# Patient Record
Sex: Female | Born: 1940 | Race: Black or African American | Hispanic: No | Marital: Married | Smoking: Never smoker
Health system: Southern US, Community
[De-identification: ages and names within clinical notes are randomized; demographics above are authoritative.]

## PROBLEM LIST (undated history)

## (undated) DIAGNOSIS — T8859XA Other complications of anesthesia, initial encounter: Secondary | ICD-10-CM

## (undated) DIAGNOSIS — E669 Obesity, unspecified: Secondary | ICD-10-CM

## (undated) DIAGNOSIS — J984 Other disorders of lung: Secondary | ICD-10-CM

## (undated) DIAGNOSIS — E785 Hyperlipidemia, unspecified: Secondary | ICD-10-CM

## (undated) DIAGNOSIS — I10 Essential (primary) hypertension: Secondary | ICD-10-CM

## (undated) DIAGNOSIS — IMO0001 Reserved for inherently not codable concepts without codable children: Secondary | ICD-10-CM

## (undated) DIAGNOSIS — G20 Parkinson's disease: Secondary | ICD-10-CM

## (undated) DIAGNOSIS — M1990 Unspecified osteoarthritis, unspecified site: Secondary | ICD-10-CM

## (undated) DIAGNOSIS — E860 Dehydration: Secondary | ICD-10-CM

## (undated) DIAGNOSIS — F0390 Unspecified dementia without behavioral disturbance: Secondary | ICD-10-CM

## (undated) DIAGNOSIS — G20A1 Parkinson's disease without dyskinesia, without mention of fluctuations: Secondary | ICD-10-CM

## (undated) DIAGNOSIS — R251 Tremor, unspecified: Secondary | ICD-10-CM

## (undated) DIAGNOSIS — K219 Gastro-esophageal reflux disease without esophagitis: Secondary | ICD-10-CM

## (undated) DIAGNOSIS — R739 Hyperglycemia, unspecified: Secondary | ICD-10-CM

## (undated) DIAGNOSIS — T4145XA Adverse effect of unspecified anesthetic, initial encounter: Secondary | ICD-10-CM

## (undated) HISTORY — DX: Gastro-esophageal reflux disease without esophagitis: K21.9

## (undated) HISTORY — DX: Obesity, unspecified: E66.9

## (undated) HISTORY — DX: Hyperlipidemia, unspecified: E78.5

## (undated) HISTORY — PX: COLONOSCOPY: SHX174

## (undated) HISTORY — DX: Hyperglycemia, unspecified: R73.9

## (undated) HISTORY — DX: Other disorders of lung: J98.4

## (undated) HISTORY — DX: Essential (primary) hypertension: I10

---

## 2000-08-29 ENCOUNTER — Encounter: Admission: RE | Admit: 2000-08-29 | Discharge: 2000-08-29 | Payer: Self-pay | Admitting: Hematology and Oncology

## 2000-09-04 ENCOUNTER — Encounter: Admission: RE | Admit: 2000-09-04 | Discharge: 2000-09-04 | Payer: Self-pay | Admitting: Internal Medicine

## 2000-09-13 ENCOUNTER — Encounter: Admission: RE | Admit: 2000-09-13 | Discharge: 2000-09-13 | Payer: Self-pay | Admitting: Hematology and Oncology

## 2000-10-02 ENCOUNTER — Encounter: Admission: RE | Admit: 2000-10-02 | Discharge: 2000-10-02 | Payer: Self-pay | Admitting: Internal Medicine

## 2001-08-23 ENCOUNTER — Encounter: Admission: RE | Admit: 2001-08-23 | Discharge: 2001-08-23 | Payer: Self-pay | Admitting: Internal Medicine

## 2001-09-04 ENCOUNTER — Encounter: Admission: RE | Admit: 2001-09-04 | Discharge: 2001-09-04 | Payer: Self-pay | Admitting: Internal Medicine

## 2001-11-13 ENCOUNTER — Encounter: Payer: Self-pay | Admitting: *Deleted

## 2001-11-13 ENCOUNTER — Ambulatory Visit (HOSPITAL_COMMUNITY): Admission: RE | Admit: 2001-11-13 | Discharge: 2001-11-13 | Payer: Self-pay | Admitting: *Deleted

## 2001-11-13 ENCOUNTER — Encounter: Admission: RE | Admit: 2001-11-13 | Discharge: 2001-11-13 | Payer: Self-pay | Admitting: Internal Medicine

## 2002-02-19 ENCOUNTER — Encounter: Admission: RE | Admit: 2002-02-19 | Discharge: 2002-02-19 | Payer: Self-pay | Admitting: Internal Medicine

## 2002-08-14 ENCOUNTER — Encounter: Admission: RE | Admit: 2002-08-14 | Discharge: 2002-08-14 | Payer: Self-pay | Admitting: Internal Medicine

## 2002-09-23 ENCOUNTER — Ambulatory Visit (HOSPITAL_BASED_OUTPATIENT_CLINIC_OR_DEPARTMENT_OTHER): Admission: RE | Admit: 2002-09-23 | Discharge: 2002-09-23 | Payer: Self-pay | Admitting: Internal Medicine

## 2002-10-01 ENCOUNTER — Encounter: Admission: RE | Admit: 2002-10-01 | Discharge: 2002-10-01 | Payer: Self-pay | Admitting: Internal Medicine

## 2002-11-14 ENCOUNTER — Encounter: Admission: RE | Admit: 2002-11-14 | Discharge: 2002-11-14 | Payer: Self-pay | Admitting: Internal Medicine

## 2003-06-24 ENCOUNTER — Encounter: Admission: RE | Admit: 2003-06-24 | Discharge: 2003-06-24 | Payer: Self-pay | Admitting: Internal Medicine

## 2003-06-25 ENCOUNTER — Ambulatory Visit (HOSPITAL_COMMUNITY): Admission: RE | Admit: 2003-06-25 | Discharge: 2003-06-25 | Payer: Self-pay | Admitting: Internal Medicine

## 2003-07-07 ENCOUNTER — Encounter: Admission: RE | Admit: 2003-07-07 | Discharge: 2003-07-07 | Payer: Self-pay | Admitting: Internal Medicine

## 2003-08-11 ENCOUNTER — Emergency Department (HOSPITAL_COMMUNITY): Admission: EM | Admit: 2003-08-11 | Discharge: 2003-08-11 | Payer: Self-pay | Admitting: Emergency Medicine

## 2003-08-11 ENCOUNTER — Encounter: Payer: Self-pay | Admitting: Orthopedic Surgery

## 2003-08-26 ENCOUNTER — Encounter: Admission: RE | Admit: 2003-08-26 | Discharge: 2003-08-26 | Payer: Self-pay | Admitting: Internal Medicine

## 2003-09-03 ENCOUNTER — Encounter: Admission: RE | Admit: 2003-09-03 | Discharge: 2003-09-03 | Payer: Self-pay | Admitting: Internal Medicine

## 2003-10-28 ENCOUNTER — Encounter: Admission: RE | Admit: 2003-10-28 | Discharge: 2003-10-28 | Payer: Self-pay | Admitting: Internal Medicine

## 2003-10-29 ENCOUNTER — Encounter: Admission: RE | Admit: 2003-10-29 | Discharge: 2003-10-29 | Payer: Self-pay | Admitting: Internal Medicine

## 2004-03-16 ENCOUNTER — Encounter: Admission: RE | Admit: 2004-03-16 | Discharge: 2004-03-16 | Payer: Self-pay | Admitting: Internal Medicine

## 2004-04-19 ENCOUNTER — Encounter: Admission: RE | Admit: 2004-04-19 | Discharge: 2004-04-19 | Payer: Self-pay | Admitting: Internal Medicine

## 2004-06-22 ENCOUNTER — Encounter: Admission: RE | Admit: 2004-06-22 | Discharge: 2004-06-22 | Payer: Self-pay | Admitting: Internal Medicine

## 2004-07-26 ENCOUNTER — Encounter: Admission: RE | Admit: 2004-07-26 | Discharge: 2004-07-26 | Payer: Self-pay | Admitting: Internal Medicine

## 2004-08-19 ENCOUNTER — Ambulatory Visit: Payer: Self-pay | Admitting: Internal Medicine

## 2004-09-07 ENCOUNTER — Ambulatory Visit: Payer: Self-pay | Admitting: Internal Medicine

## 2004-09-23 ENCOUNTER — Ambulatory Visit (HOSPITAL_COMMUNITY): Admission: RE | Admit: 2004-09-23 | Discharge: 2004-09-23 | Payer: Self-pay | Admitting: Obstetrics and Gynecology

## 2004-10-04 ENCOUNTER — Ambulatory Visit: Payer: Self-pay | Admitting: Internal Medicine

## 2004-12-07 ENCOUNTER — Ambulatory Visit: Payer: Self-pay | Admitting: Internal Medicine

## 2004-12-23 ENCOUNTER — Ambulatory Visit: Payer: Self-pay | Admitting: Internal Medicine

## 2005-03-08 ENCOUNTER — Ambulatory Visit: Payer: Self-pay | Admitting: Internal Medicine

## 2005-06-07 ENCOUNTER — Ambulatory Visit: Payer: Self-pay | Admitting: Internal Medicine

## 2005-06-28 ENCOUNTER — Encounter (INDEPENDENT_AMBULATORY_CARE_PROVIDER_SITE_OTHER): Payer: Self-pay | Admitting: Internal Medicine

## 2005-06-28 ENCOUNTER — Ambulatory Visit: Payer: Self-pay | Admitting: Internal Medicine

## 2005-10-11 ENCOUNTER — Ambulatory Visit: Payer: Self-pay | Admitting: Internal Medicine

## 2005-10-13 ENCOUNTER — Ambulatory Visit: Payer: Self-pay | Admitting: Internal Medicine

## 2006-01-10 ENCOUNTER — Ambulatory Visit: Payer: Self-pay | Admitting: Internal Medicine

## 2006-04-25 ENCOUNTER — Encounter (INDEPENDENT_AMBULATORY_CARE_PROVIDER_SITE_OTHER): Payer: Self-pay | Admitting: Internal Medicine

## 2006-04-25 ENCOUNTER — Ambulatory Visit: Payer: Self-pay | Admitting: Internal Medicine

## 2006-04-27 ENCOUNTER — Ambulatory Visit (HOSPITAL_COMMUNITY): Admission: RE | Admit: 2006-04-27 | Discharge: 2006-04-27 | Payer: Self-pay | Admitting: Internal Medicine

## 2006-04-27 ENCOUNTER — Encounter (INDEPENDENT_AMBULATORY_CARE_PROVIDER_SITE_OTHER): Payer: Self-pay | Admitting: Internal Medicine

## 2006-09-27 DIAGNOSIS — K219 Gastro-esophageal reflux disease without esophagitis: Secondary | ICD-10-CM | POA: Insufficient documentation

## 2006-09-27 DIAGNOSIS — I10 Essential (primary) hypertension: Secondary | ICD-10-CM

## 2006-09-27 DIAGNOSIS — E785 Hyperlipidemia, unspecified: Secondary | ICD-10-CM

## 2006-09-27 DIAGNOSIS — E669 Obesity, unspecified: Secondary | ICD-10-CM

## 2006-10-10 ENCOUNTER — Ambulatory Visit: Payer: Self-pay | Admitting: Internal Medicine

## 2006-10-10 LAB — CONVERTED CEMR LAB
ALT: 13 units/L (ref 0–35)
Alkaline Phosphatase: 131 units/L — ABNORMAL HIGH (ref 39–117)
CO2: 27 meq/L (ref 19–32)
Chloride: 108 meq/L (ref 96–112)
Creatinine, Ser: 0.8 mg/dL (ref 0.40–1.20)
Glucose, Bld: 85 mg/dL (ref 70–99)
Sodium: 144 meq/L (ref 135–145)
Total Bilirubin: 0.9 mg/dL (ref 0.3–1.2)
Total Protein: 7.7 g/dL (ref 6.0–8.3)

## 2006-12-11 DIAGNOSIS — J64 Unspecified pneumoconiosis: Secondary | ICD-10-CM | POA: Insufficient documentation

## 2007-02-20 ENCOUNTER — Ambulatory Visit: Payer: Self-pay | Admitting: Internal Medicine

## 2007-02-20 DIAGNOSIS — J301 Allergic rhinitis due to pollen: Secondary | ICD-10-CM

## 2007-02-20 LAB — CONVERTED CEMR LAB
Cholesterol: 178 mg/dL (ref 0–200)
LDL Cholesterol: 108 mg/dL — ABNORMAL HIGH (ref 0–99)
Total CHOL/HDL Ratio: 3.3
Triglycerides: 82 mg/dL (ref <150)
VLDL: 16 mg/dL (ref 0–40)

## 2007-02-21 ENCOUNTER — Encounter (INDEPENDENT_AMBULATORY_CARE_PROVIDER_SITE_OTHER): Payer: Self-pay | Admitting: Internal Medicine

## 2007-02-27 LAB — CONVERTED CEMR LAB: OCCULT 3: NEGATIVE

## 2007-03-15 ENCOUNTER — Ambulatory Visit: Payer: Self-pay | Admitting: Internal Medicine

## 2007-04-06 ENCOUNTER — Telehealth (INDEPENDENT_AMBULATORY_CARE_PROVIDER_SITE_OTHER): Payer: Self-pay | Admitting: *Deleted

## 2007-04-10 ENCOUNTER — Telehealth (INDEPENDENT_AMBULATORY_CARE_PROVIDER_SITE_OTHER): Payer: Self-pay | Admitting: Pharmacy Technician

## 2007-04-30 ENCOUNTER — Telehealth (INDEPENDENT_AMBULATORY_CARE_PROVIDER_SITE_OTHER): Payer: Self-pay | Admitting: Pharmacy Technician

## 2007-06-05 ENCOUNTER — Ambulatory Visit: Payer: Self-pay | Admitting: Internal Medicine

## 2007-06-05 LAB — CONVERTED CEMR LAB
ALT: 15 units/L (ref 0–35)
Albumin: 4.2 g/dL (ref 3.5–5.2)
CO2: 26 meq/L (ref 19–32)
Calcium: 10 mg/dL (ref 8.4–10.5)
Creatinine, Ser: 0.79 mg/dL (ref 0.40–1.20)
Glucose, Bld: 78 mg/dL (ref 70–99)
Total Bilirubin: 1.3 mg/dL — ABNORMAL HIGH (ref 0.3–1.2)

## 2007-06-19 ENCOUNTER — Telehealth (INDEPENDENT_AMBULATORY_CARE_PROVIDER_SITE_OTHER): Payer: Self-pay | Admitting: Pharmacy Technician

## 2007-06-27 ENCOUNTER — Telehealth (INDEPENDENT_AMBULATORY_CARE_PROVIDER_SITE_OTHER): Payer: Self-pay | Admitting: Pharmacy Technician

## 2007-08-06 ENCOUNTER — Telehealth (INDEPENDENT_AMBULATORY_CARE_PROVIDER_SITE_OTHER): Payer: Self-pay | Admitting: Pharmacy Technician

## 2007-08-21 ENCOUNTER — Ambulatory Visit: Payer: Self-pay | Admitting: Internal Medicine

## 2007-08-21 LAB — CONVERTED CEMR LAB
ALT: 15 units/L (ref 0–35)
AST: 16 units/L (ref 0–37)
Albumin: 4.2 g/dL (ref 3.5–5.2)
Bilirubin Urine: NEGATIVE
CO2: 26 meq/L (ref 19–32)
HDL: 57 mg/dL (ref >39)
Leukocytes, UA: NEGATIVE
Lipase: 24 units/L (ref 0–75)
Total CHOL/HDL Ratio: 2.8
Total Protein: 8.1 g/dL (ref 6.0–8.3)
VLDL: 17 mg/dL (ref 0–40)

## 2007-08-29 ENCOUNTER — Ambulatory Visit (HOSPITAL_COMMUNITY): Admission: RE | Admit: 2007-08-29 | Discharge: 2007-08-29 | Payer: Self-pay | Admitting: Internal Medicine

## 2007-08-30 ENCOUNTER — Telehealth (INDEPENDENT_AMBULATORY_CARE_PROVIDER_SITE_OTHER): Payer: Self-pay | Admitting: Internal Medicine

## 2007-09-03 ENCOUNTER — Telehealth: Payer: Self-pay | Admitting: *Deleted

## 2007-09-07 ENCOUNTER — Ambulatory Visit (HOSPITAL_COMMUNITY): Admission: RE | Admit: 2007-09-07 | Discharge: 2007-09-07 | Payer: Self-pay | Admitting: Internal Medicine

## 2007-09-25 ENCOUNTER — Ambulatory Visit: Payer: Self-pay | Admitting: Internal Medicine

## 2007-10-04 ENCOUNTER — Telehealth (INDEPENDENT_AMBULATORY_CARE_PROVIDER_SITE_OTHER): Payer: Self-pay | Admitting: Internal Medicine

## 2007-10-31 ENCOUNTER — Telehealth (INDEPENDENT_AMBULATORY_CARE_PROVIDER_SITE_OTHER): Payer: Self-pay | Admitting: Pharmacy Technician

## 2007-12-26 ENCOUNTER — Telehealth (INDEPENDENT_AMBULATORY_CARE_PROVIDER_SITE_OTHER): Payer: Self-pay | Admitting: Pharmacy Technician

## 2008-01-04 ENCOUNTER — Telehealth (INDEPENDENT_AMBULATORY_CARE_PROVIDER_SITE_OTHER): Payer: Self-pay | Admitting: Pharmacy Technician

## 2008-02-29 ENCOUNTER — Telehealth (INDEPENDENT_AMBULATORY_CARE_PROVIDER_SITE_OTHER): Payer: Self-pay | Admitting: Pharmacy Technician

## 2008-04-04 ENCOUNTER — Encounter (INDEPENDENT_AMBULATORY_CARE_PROVIDER_SITE_OTHER): Payer: Self-pay | Admitting: Internal Medicine

## 2008-04-08 ENCOUNTER — Ambulatory Visit: Payer: Self-pay | Admitting: Internal Medicine

## 2008-04-08 LAB — CONVERTED CEMR LAB
Albumin: 4.4 g/dL (ref 3.5–5.2)
BUN: 12 mg/dL (ref 6–23)
CO2: 25 meq/L (ref 19–32)
Chloride: 104 meq/L (ref 96–112)
Creatinine, Ser: 0.79 mg/dL (ref 0.40–1.20)
Glucose, Bld: 114 mg/dL — ABNORMAL HIGH (ref 70–99)
HDL: 55 mg/dL (ref >39)
LDL Cholesterol: 86 mg/dL (ref 0–99)
Potassium: 3.9 meq/L (ref 3.5–5.3)
Sodium: 143 meq/L (ref 135–145)
Total CHOL/HDL Ratio: 2.8
Total Protein: 8.5 g/dL — ABNORMAL HIGH (ref 6.0–8.3)
VLDL: 15 mg/dL (ref 0–40)

## 2008-04-17 ENCOUNTER — Ambulatory Visit (HOSPITAL_COMMUNITY): Admission: RE | Admit: 2008-04-17 | Discharge: 2008-04-17 | Payer: Self-pay | Admitting: Internal Medicine

## 2008-04-17 ENCOUNTER — Encounter (INDEPENDENT_AMBULATORY_CARE_PROVIDER_SITE_OTHER): Payer: Self-pay | Admitting: Internal Medicine

## 2008-04-18 ENCOUNTER — Ambulatory Visit: Payer: Self-pay | Admitting: Internal Medicine

## 2008-04-18 LAB — CONVERTED CEMR LAB: OCCULT 1: NEGATIVE

## 2008-04-22 ENCOUNTER — Encounter (INDEPENDENT_AMBULATORY_CARE_PROVIDER_SITE_OTHER): Payer: Self-pay | Admitting: Internal Medicine

## 2008-04-24 ENCOUNTER — Encounter (INDEPENDENT_AMBULATORY_CARE_PROVIDER_SITE_OTHER): Payer: Self-pay | Admitting: Internal Medicine

## 2008-05-06 ENCOUNTER — Ambulatory Visit: Payer: Self-pay | Admitting: Internal Medicine

## 2008-05-08 LAB — CONVERTED CEMR LAB
AST: 13 units/L (ref 0–37)
Alkaline Phosphatase: 149 units/L — ABNORMAL HIGH (ref 39–117)
HCV Ab: NEGATIVE
Hep B Core Total Ab: NEGATIVE

## 2008-09-16 ENCOUNTER — Ambulatory Visit: Payer: Self-pay | Admitting: Internal Medicine

## 2008-09-17 ENCOUNTER — Encounter (INDEPENDENT_AMBULATORY_CARE_PROVIDER_SITE_OTHER): Payer: Self-pay | Admitting: Internal Medicine

## 2008-10-07 ENCOUNTER — Telehealth: Payer: Self-pay | Admitting: *Deleted

## 2009-01-13 ENCOUNTER — Ambulatory Visit: Payer: Self-pay | Admitting: Internal Medicine

## 2009-01-16 LAB — CONVERTED CEMR LAB
ALT: 9 units/L (ref 0–35)
BUN: 12 mg/dL (ref 6–23)
CO2: 23 meq/L (ref 19–32)
Cholesterol: 145 mg/dL (ref 0–200)
LDL Cholesterol: 80 mg/dL (ref 0–99)
Potassium: 4.5 meq/L (ref 3.5–5.3)
Sodium: 143 meq/L (ref 135–145)
Total CHOL/HDL Ratio: 2.8
Total Protein: 8 g/dL (ref 6.0–8.3)
Triglycerides: 68 mg/dL (ref <150)

## 2009-01-19 ENCOUNTER — Telehealth: Payer: Self-pay | Admitting: *Deleted

## 2009-03-13 ENCOUNTER — Encounter (INDEPENDENT_AMBULATORY_CARE_PROVIDER_SITE_OTHER): Payer: Self-pay | Admitting: Internal Medicine

## 2009-05-19 ENCOUNTER — Ambulatory Visit: Payer: Self-pay | Admitting: Internal Medicine

## 2009-05-22 DIAGNOSIS — E119 Type 2 diabetes mellitus without complications: Secondary | ICD-10-CM

## 2009-05-22 LAB — CONVERTED CEMR LAB
Chloride: 103 meq/L (ref 96–112)
Creatinine, Ser: 0.77 mg/dL (ref 0.40–1.20)
GFR calc non Af Amer: >60 mL/min (ref >60)
HDL: 53 mg/dL (ref >39)
LDL Cholesterol: 80 mg/dL (ref 0–99)
Sodium: 141 meq/L (ref 135–145)
Total CHOL/HDL Ratio: 2.8
Total CK: 171 units/L (ref 7–177)
Triglycerides: 64 mg/dL (ref <150)
VLDL: 13 mg/dL (ref 0–40)

## 2009-05-25 ENCOUNTER — Telehealth: Payer: Self-pay

## 2009-05-25 ENCOUNTER — Ambulatory Visit: Payer: Self-pay | Admitting: Internal Medicine

## 2009-05-25 LAB — CONVERTED CEMR LAB
Hemoglobin, Urine: NEGATIVE
Specific Gravity, Urine: 1.025 (ref 1.005–1.030)
Urine Glucose: NEGATIVE mg/dL
pH: 5.5 (ref 5.0–8.0)

## 2009-05-26 ENCOUNTER — Encounter (INDEPENDENT_AMBULATORY_CARE_PROVIDER_SITE_OTHER): Payer: Self-pay | Admitting: Internal Medicine

## 2009-06-23 ENCOUNTER — Ambulatory Visit: Payer: Self-pay | Admitting: Internal Medicine

## 2009-07-08 ENCOUNTER — Telehealth: Payer: Self-pay | Admitting: *Deleted

## 2009-07-10 ENCOUNTER — Ambulatory Visit: Payer: Self-pay | Admitting: Internal Medicine

## 2009-07-20 ENCOUNTER — Telehealth (INDEPENDENT_AMBULATORY_CARE_PROVIDER_SITE_OTHER): Payer: Self-pay | Admitting: Internal Medicine

## 2009-07-21 ENCOUNTER — Telehealth: Payer: Self-pay | Admitting: *Deleted

## 2009-09-03 ENCOUNTER — Ambulatory Visit: Payer: Self-pay | Admitting: Internal Medicine

## 2009-09-04 ENCOUNTER — Encounter: Payer: Self-pay | Admitting: Internal Medicine

## 2009-09-04 LAB — CONVERTED CEMR LAB
Cholesterol: 168 mg/dL (ref 0–200)
LDL Cholesterol: 91 mg/dL (ref 0–99)

## 2009-09-29 ENCOUNTER — Ambulatory Visit: Payer: Self-pay | Admitting: Internal Medicine

## 2009-09-29 LAB — CONVERTED CEMR LAB: Blood Glucose, AC Bkfst: 117 mg/dL

## 2009-10-13 ENCOUNTER — Ambulatory Visit: Payer: Self-pay | Admitting: Internal Medicine

## 2010-01-12 ENCOUNTER — Encounter (INDEPENDENT_AMBULATORY_CARE_PROVIDER_SITE_OTHER): Payer: Self-pay | Admitting: *Deleted

## 2010-01-12 ENCOUNTER — Ambulatory Visit: Payer: Self-pay | Admitting: Internal Medicine

## 2010-01-14 LAB — CONVERTED CEMR LAB
Albumin: 4.2 g/dL (ref 3.5–5.2)
Calcium: 9.6 mg/dL (ref 8.4–10.5)
Glucose, Bld: 136 mg/dL — ABNORMAL HIGH (ref 70–99)
HDL: 55 mg/dL (ref >39)
Total Bilirubin: 0.9 mg/dL (ref 0.3–1.2)
Total CHOL/HDL Ratio: 3.3
Total Protein: 7.7 g/dL (ref 6.0–8.3)

## 2010-01-19 ENCOUNTER — Ambulatory Visit (HOSPITAL_COMMUNITY): Admission: RE | Admit: 2010-01-19 | Discharge: 2010-01-19 | Payer: Self-pay | Admitting: Internal Medicine

## 2010-01-20 ENCOUNTER — Encounter (INDEPENDENT_AMBULATORY_CARE_PROVIDER_SITE_OTHER): Payer: Self-pay | Admitting: *Deleted

## 2010-01-25 ENCOUNTER — Ambulatory Visit: Payer: Self-pay | Admitting: Gastroenterology

## 2010-02-03 ENCOUNTER — Ambulatory Visit: Payer: Self-pay | Admitting: Gastroenterology

## 2010-02-08 ENCOUNTER — Encounter: Payer: Self-pay | Admitting: Gastroenterology

## 2010-04-06 ENCOUNTER — Ambulatory Visit: Payer: Self-pay | Admitting: Internal Medicine

## 2010-04-06 LAB — CONVERTED CEMR LAB: Pap Smear: NEGATIVE

## 2010-07-13 ENCOUNTER — Ambulatory Visit: Payer: Self-pay | Admitting: Internal Medicine

## 2010-07-13 DIAGNOSIS — G20 Parkinson's disease: Secondary | ICD-10-CM

## 2010-08-04 LAB — CONVERTED CEMR LAB
ALT: 12 units/L (ref 0–35)
AST: 12 units/L (ref 0–37)
Albumin: 3.9 g/dL (ref 3.5–5.2)
Alkaline Phosphatase: 112 units/L (ref 39–117)
Calcium: 9.6 mg/dL (ref 8.4–10.5)
Cholesterol: 163 mg/dL (ref 0–200)
HDL: 56 mg/dL (ref >39)
Sodium: 140 meq/L (ref 135–145)
Total Bilirubin: 0.9 mg/dL (ref 0.3–1.2)
VLDL: 13 mg/dL (ref 0–40)

## 2010-08-20 ENCOUNTER — Ambulatory Visit: Payer: Self-pay | Admitting: Internal Medicine

## 2010-09-28 ENCOUNTER — Ambulatory Visit: Payer: Self-pay | Admitting: Internal Medicine

## 2010-11-12 ENCOUNTER — Ambulatory Visit: Payer: Self-pay | Admitting: Internal Medicine

## 2010-11-12 LAB — CONVERTED CEMR LAB: Blood Glucose, Fingerstick: 139

## 2010-12-28 NOTE — Assessment & Plan Note (Signed)
Summary: CHECKUP/SB.   Vital Signs:  Patient profile:   70 year old female Weight:      199.4 pounds (90.64 kg) BMI:     37.50 Temp:     97.3 degrees F (36.28 degrees C) Pulse rate:   79 / minute BP sitting:   122 / 64  (left arm) Cuff size:   large  Vitals Entered By: Cynda Familia Duncan Dull) (September 28, 2010 10:35 AM) CC: pt c/o "hand tremors" x 3nths  Have you ever been in a relationship where you felt threatened, hurt or afraid?No   Does patient need assistance? Functional Status Self care Ambulation Normal   Diabetic Foot Exam Foot Inspection Is there a history of a foot ulcer?              No Is there a foot ulcer now?              No Can the patient see the bottom of their feet?          Yes Are the shoes appropriate in style and fit?          Yes Is there swelling or an abnormal foot shape?          No Are the toenails long?                No Are the toenails thick?                Yes Are the toenails ingrown?              Yes Is there heavy callous build-up?              No  Diabetic Foot Care Education Patient educated on appropriate care of diabetic feet.   High Risk Feet? No   10-g (5.07) Semmes-Weinstein Monofilament Test Performed by: Lynn Ito          Right Foot          Left Foot Visual Inspection     normal           normal Test Control      normal         normal Site 1         normal         normal Site 2         normal         normal Site 3         normal         normal Site 4         normal         normal Site 5         normal         normal Site 6         normal         normal  Impression      normal         normal   Primary Care Provider:  Zoila Shutter MD  CC:  pt c/o "hand tremors" x 3nths.  History of Present Illness: 70 yr old who comes in for 3 month follow up. She has not been exercising and her weight is not down.  When she was last in she had a fasting bg that was 144 and a HbA1c of 7.2 and I have told her that does  put her in the diabetic category. I have also again told  her if she gets serious and loses 10 pounds then this would normalize.  She says her breathing has been fine.  Her main reason for coming in earlier than her scheduled appointment is because she is more concerned about the tremor in her hands that seems to be getting more pronounced. It is there all of the time and it does not seem to get worse with movement. Her thumbs are always moving.  She does think she remembers her mother having a tremor as well as she got older, but isn't sure she remembers it being very bad.  Current Medications (verified): 1)  Hydrochlorothiazide 25 Mg Tabs (Hydrochlorothiazide) .... Take 1 Tablet By Mouth Once A Day 2)  Prilosec 40 Mg Cpdr (Omeprazole) .... Take 1 Tablet By Mouth Once A Day 3)  Proair Hfa 108 (90 Base) Mcg/act Aers (Albuterol Sulfate) .... 2 Puffs Every 4-6 Hours As Needed For Shortness of Breath 4)  Lisinopril 10 Mg Tabs (Lisinopril) .... Take 1 Tablet By Mouth Once A Day 5)  Pravastatin Sodium 80 Mg Tabs (Pravastatin Sodium) .... Take 1 Tablet By Mouth Once A Day 6)  Advair Diskus 500-50 Mcg/dose Aepb (Fluticasone-Salmeterol) .Marland Kitchen.. 1puff 2 Times A Day  Allergies (verified): No Known Drug Allergies  Past History:  Past Medical History: Reviewed history from 09/27/2006 and no changes required. 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 Mother died of MI age 52. Father died of unknown cause age unknown. Alcoholic. Brother died of cancer ? type Brother died of "bone cancer" Family History Diabetes 1st degree relative - Mother  Social History: Reviewed history from 01/13/2009 and no changes required. Occupation: [employer] x 31 yrs. Married  5 children - 1 with  sarcoidosis; daugher with COPD Never Smoked Alcohol use-no Drug use-no Regular exercise-no  Review of Systems General:  Denies fatigue, malaise, and weakness. CV:  Denies chest pain or discomfort and palpitations. Resp:  Denies cough and shortness of breath. Neuro:  Complains of tremors; denies falling down, numbness, tingling, and weakness. Psych:  Denies depression.  Physical Exam  General:  alert and well-developed.   Head:  normocephalic and atraumatic.   Eyes:  vision grossly intact.   Ears:  R ear normal.   Neck:  supple and no masses.   Lungs:  normal respiratory effort and normal breath sounds.   Heart:  normal rate, regular rhythm, and no murmur.   Abdomen:  soft, non-tender, and normal bowel sounds.   Extremities:  no edema Neurologic:  alert & oriented X3, cranial nerves II-XII intact, and strength normal in all extremities.  she does have a fine tremor in both hands. her thumbs do seem to be always moving. intention does not seem to make the tremor worse.  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 Therefore we will do neurlology referral.  Problem # 2:  HYPERTENSION (ICD-401.9) Well controlled. Continue present regimen. Her updated medication list for this problem includes:    Hydrochlorothiazide 25 Mg Tabs (Hydrochlorothiazide) .Marland Kitchen... Take 1 tablet by mouth once a day    Lisinopril 10 Mg Tabs (Lisinopril) .Marland Kitchen... Take 1 tablet by mouth once a day  Problem # 3:  DIABETES MELLITUS, TYPE II (ICD-250.00) She does in  fact have mild diet controlled diabetes. If she lost even 5-10 kilos it would normalize her sugars as I have discussed with her today. Her updated medication list for this problem includes:    Lisinopril 10 Mg Tabs (Lisinopril) .Marland Kitchen... Take 1 tablet by mouth once a day  Orders: T- Capillary Blood Glucose (82948) T-Hgb A1C (in-house) (40981XB)  Problem # 4:  OCCUPATIONAL ASTHMA (ICD-505) Her breathing has been good and she is hardly ever using her rescue inhaler. Her updated medication list for this problem includes:    Proair Hfa 108 (90 Base) Mcg/act Aers (Albuterol sulfate) .Marland Kitchen... 2 puffs every 4-6 hours as needed for shortness of breath    Advair Diskus 500-50 Mcg/dose Aepb (Fluticasone-salmeterol) .Marland Kitchen... 1puff 2 times a day  Problem # 5:  HYPERLIPIDEMIA (ICD-272.4) Stable on statin. Continue. Her updated medication list for this problem includes:    Pravastatin Sodium 80 Mg Tabs (Pravastatin sodium) .Marland Kitchen... Take 1 tablet by mouth once a day  Complete Medication List: 1)  Hydrochlorothiazide 25 Mg Tabs (Hydrochlorothiazide) .... Take 1 tablet by mouth once a day 2)  Prilosec 40 Mg Cpdr (Omeprazole) .... Take 1 tablet by mouth once a day 3)  Proair Hfa 108 (90 Base) Mcg/act Aers (Albuterol sulfate) .... 2 puffs every 4-6 hours as needed for shortness of breath 4)  Lisinopril 10 Mg Tabs (Lisinopril) .... Take 1 tablet by mouth once a day 5)  Pravastatin Sodium 80 Mg Tabs (Pravastatin sodium) .... Take 1 tablet by mouth once a day 6)  Advair Diskus 500-50 Mcg/dose Aepb (Fluticasone-salmeterol) .Marland Kitchen.. 1puff 2 times a day  Other Orders: Neurology Referral (Neuro)  Patient Instructions: 1)  Please schedule a follow-up appointment in 3 months. 2)  Please come fasting when you come in. 3)  I know you can lose that 10 pounds if you work on it, and I think it will bring your blood sugar into a normal range.Marland Kitchen 4)  I wish you happy holidays.   5)  We are referring you to neurology today to evaluate  the tremor in your hands   Orders Added: 1)  T- Capillary Blood Glucose [82948] 2)  T-Hgb A1C (in-house) [83036QW] 3)  Est. Patient Level IV [14782] 4)  Neurology Referral [Neuro]    Prevention & Chronic Care Immunizations   Influenza vaccine: Fluvax MCR  (08/20/2010)   Influenza vaccine due: 09/24/2008    Tetanus booster: 09/29/2009: Tdap    Pneumococcal vaccine: Pneumovax  (09/29/2009)   Pneumococcal vaccine due: None    H. zoster vaccine: Not documented  Colorectal Screening   Hemoccult: normal  (04/18/2008)   Hemoccult due: 04/18/2009    Colonoscopy: DONE  (02/03/2010)   Colonoscopy action/deferral: GI referral  (01/12/2010)   Colonoscopy due: 01/2013  Other Screening   Pap smear: NEGATIVE FOR INTRAEPITHELIAL LESIONS OR MALIGNANCY.  (04/06/2010)   Pap smear action/deferral: Ordered  (04/06/2010)   Pap smear due: 04/26/2007    Mammogram: ASSESSMENT: Negative - BI-RADS 1^MM DIGITAL SCREENING  (01/19/2010)   Mammogram  action/deferral: Ordered  (01/12/2010)   Mammogram due: 04/2009    DXA bone density scan:  Lumbar Spine:  T Score > -1.0 Spine.  Hip Total: T Score > -1.0 Hip.    (04/17/2008)   DXA scan due: 04/2010    Smoking status: never  (07/13/2010)  Diabetes Mellitus   HgbA1C: 7.3  (09/28/2010)    Eye exam: Not documented   Diabetic eye exam action/deferral: Deferred  (01/12/2010)    Foot exam: yes  (09/28/2010)   Foot exam action/deferral: Do today   High risk foot: No  (09/28/2010)   Foot care education: Done  (09/28/2010)    Urine microalbumin/creatinine ratio: Not documented   Urine microalbumin action/deferral: Deferred    Diabetes flowsheet reviewed?: Yes   Progress toward A1C goal: Unchanged   Diabetes comments: she is diet controlled and I have discussed the importance of weight loss and exercise  Lipids   Total Cholesterol: 163  (07/13/2010)   LDL: 94  (07/13/2010)   LDL Direct: Not documented   HDL: 56  (07/13/2010)    Triglycerides: 64  (07/13/2010)    SGOT (AST): 12  (07/13/2010)   SGPT (ALT): 12  (07/13/2010)   Alkaline phosphatase: 112  (07/13/2010)   Total bilirubin: 0.9  (07/13/2010)    Lipid flowsheet reviewed?: Yes   Progress toward LDL goal: At goal  Hypertension   Last Blood Pressure: 122 / 64  (09/28/2010)   Serum creatinine: 0.83  (07/13/2010)   Serum potassium 4.3  (07/13/2010)    Hypertension flowsheet reviewed?: Yes   Progress toward BP goal: At goal  Self-Management Support :   Personal Goals (by the next clinic visit) :     Personal A1C goal: 7  (01/12/2010)     Personal blood pressure goal: 130/80  (01/12/2010)     Personal LDL goal: 100  (01/12/2010)    Patient will work on the following items until the next clinic visit to reach self-care goals:     Medications and monitoring: take my medicines every day, examine my feet every day  (09/28/2010)     Eating: eat foods that are low in salt, eat fruit for snacks and desserts  (09/28/2010)     Activity: join a walking program  (07/13/2010)    Diabetes self-management support: Written self-care plan  (09/28/2010)   Diabetes care plan printed    Hypertension self-management support: Written self-care plan  (09/28/2010)   Hypertension self-care plan printed.    Lipid self-management support: Written self-care plan  (09/28/2010)   Lipid self-care plan printed.   Nursing Instructions: Diabetic foot exam today   Laboratory Results   Blood Tests   Date/Time Received: September 28, 2010 10:53 AM  Date/Time Reported: Burke Keels  September 28, 2010 10:53 AM   HGBA1C: 7.3%   (Normal Range: Non-Diabetic - 3-6%   Control Diabetic - 6-8%) CBG Fasting:: 158mg /dL

## 2010-12-28 NOTE — Miscellaneous (Signed)
Summary: [facility]  Clinical Lists Changes  Medications: Added new medication of MOVIPREP 100 GM  SOLR (PEG-KCL-NACL-NASULF-NA ASC-C) As per prep instructions. - Signed Rx of MOVIPREP 100 GM  SOLR (PEG-KCL-NACL-NASULF-NA ASC-C) As per prep instructions.;  #1 x 0;  Signed;  Entered by: Ezra Sites RN;  Authorized by: Louis Meckel MD;  Method used: Electronically to [facility] [address] [phone]*, [address] [place], [place]  [address], Ph: [phone], Fax: [phone] Observations: Added new observation of NKA: T (01/25/2010 9:59)    Prescriptions: MOVIPREP 100 GM  SOLR (PEG-KCL-NACL-NASULF-NA ASC-C) As per prep instructions.  #1 x 0   Entered by:   Ezra Sites RN   Authorized by:   Louis Meckel MD   Signed by:   Ezra Sites RN on 01/25/2010   Method used:   Electronically to        [facility] [phone]* (retail)       [address] [place], [place]  [address]       Ph: [phone]       Fax: [phone]   RxID:   [id number]

## 2010-12-28 NOTE — Assessment & Plan Note (Signed)
Summary: FU VISIT/DS   Vital Signs:  Patient profile:   70 year old female Height:      61.25 inches (155.57 cm) Weight:      197.0 pounds (89.55 kg) BMI:     37.05 Temp:     97.6 degrees F oral Pulse rate:   89 / minute BP sitting:   129 / 80  (right arm)  Vitals Entered By: Chinita Pester RN (January 12, 2010 9:47 AM) CC: Check-up, Hypertension Management Is Patient Diabetic? No Nutritional Status BMI of > 30 = obese  Have you ever been in a relationship where you felt threatened, hurt or afraid?Unable to ask;someone w/pt.   Does patient need assistance? Functional Status Self care Ambulation Normal   Primary Care Provider:  Zoila Shutter MD  CC:  Check-up and Hypertension Management.  History of Present Illness: 70 yr old pt. who comes in for follow up. When she was last in I challenged her with getting some regular exercise. Though she has not been exercising she has been watching what she eats and has lost 3 pounds! This is a big deal for her.  We are to discuss health maintenance today. She has never had a colonoscopy but is ready to get one. She is due for a mammogram, and a pap smear. She is up to date on immunizations.  Her breathing has been good. She has not used  her proair inhaler in the last week and only has needed it occasionally.  Though she is prescribed amlodipine, she has not been taking it because she did not know what it was for. However her BP is well- controlled today on just 2 agents.  Hypertension History:      Positive major cardiovascular risk factors include female age 87 years old or older, diabetes, hyperlipidemia, and hypertension.  Negative major cardiovascular risk factors include non-tobacco-user status.    Preventive Screening-Counseling & Management  Alcohol-Tobacco     Alcohol drinks/day: 0     Smoking Status: never     Passive Smoke Exposure: no  Caffeine-Diet-Exercise     Does Patient Exercise: no  Current Medications  (verified): 1)  Hydrochlorothiazide 25 Mg Tabs (Hydrochlorothiazide) .... Take 1 Tablet By Mouth Once A Day 2)  Prilosec 40 Mg Cpdr (Omeprazole) .... Take 1 Tablet By Mouth Once A Day 3)  Amlodipine Besylate 5 Mg Tabs (Amlodipine Besylate) .... Take 1 Tablet By Mouth Once A Day 4)  Proair Hfa 108 (90 Base) Mcg/act Aers (Albuterol Sulfate) .... 2 Puffs Every 4-6 Hours As Needed For Shortness of Breath 5)  Lisinopril 10 Mg Tabs (Lisinopril) .... Take 1 Tablet By Mouth Once A Day 6)  Pravastatin Sodium 80 Mg Tabs (Pravastatin Sodium) .... Take 1 Tablet By Mouth Once A Day 7)  Advair Diskus 500-50 Mcg/dose Aepb (Fluticasone-Salmeterol) .Marland Kitchen.. 1puff 2 Times A Day  Allergies (verified): No Known Drug Allergies  Review of Systems General:  Denies fatigue and weakness. CV:  Denies chest pain or discomfort and palpitations. Resp:  Denies cough and shortness of breath. GI:  Denies abdominal pain, constipation, nausea, and vomiting.   Impression & Recommendations:  Problem # 1:  HYPERTENSION (ICD-401.9) Will d/c the amlodipine. She is well controlled. continue weight loss. Her updated medication list for this problem includes:    Hydrochlorothiazide 25 Mg Tabs (Hydrochlorothiazide) .Marland Kitchen... Take 1 tablet by mouth once a day    Amlodipine Besylate 5 Mg Tabs (Amlodipine besylate) .Marland Kitchen... Take 1 tablet by mouth once a day  Lisinopril 10 Mg Tabs (Lisinopril) .Marland Kitchen... Take 1 tablet by mouth once a day  Orders: T-Comprehensive Metabolic Panel ([id number])  Problem # 2:  OCCUPATIONAL ASTHMA (ICD-505) Her breathing is good. Her updated medication list for this problem includes:    Proair Hfa 108 (90 Base) Mcg/act Aers (Albuterol sulfate) .Marland Kitchen... 2 puffs every 4-6 hours as needed for shortness of breath    Advair Diskus 500-50 Mcg/dose Aepb (Fluticasone-salmeterol) .Marland Kitchen... 1puff 2 times a day  Problem # 3:  PREVENTIVE HEALTH CARE (ICD-V70.0) Colonoscopy and mammogram ordered. Pt. will return in 3 months for  physical exam.  Problem # 4:  DIABETES MELLITUS, TYPE II (ICD-250.00) She is well-controlled. The following medications were removed from the medication list:    Metformin Hcl 500 Mg Tabs (Metformin hcl) .Marland Kitchen... Take 1 tablet by mouth once a day Her updated medication list for this problem includes:    Lisinopril 10 Mg Tabs (Lisinopril) .Marland Kitchen... Take 1 tablet by mouth once a day  Orders: T- Capillary Blood Glucose (91478) T-Hgb A1C (in-house) ([id number]) T-Comprehensive Metabolic Panel ([id number])  Problem # 5:  OBESITY (ICD-278.00) Pt. is going in the right direction and is encouraged to continue.  Problem # 6:  HYPERLIPIDEMIA (ICD-272.4)  Her updated medication list for this problem includes:    Pravastatin Sodium 80 Mg Tabs (Pravastatin sodium) .Marland Kitchen... Take 1 tablet by mouth once a day  Orders: T-Comprehensive Metabolic Panel [phone]) T-Lipid Profile ([id number])  Complete Medication List: 1)  Hydrochlorothiazide 25 Mg Tabs (Hydrochlorothiazide) .... Take 1 tablet by mouth once a day 2)  Prilosec 40 Mg Cpdr (Omeprazole) .... Take 1 tablet by mouth once a day 3)  Amlodipine Besylate 5 Mg Tabs (Amlodipine besylate) .... Take 1 tablet by mouth once a day 4)  Proair Hfa 108 (90 Base) Mcg/act Aers (Albuterol sulfate) .... 2 puffs every 4-6 hours as needed for shortness of breath 5)  Lisinopril 10 Mg Tabs (Lisinopril) .... Take 1 tablet by mouth once a day 6)  Pravastatin Sodium 80 Mg Tabs (Pravastatin sodium) .... Take 1 tablet by mouth once a day 7)  Advair Diskus 500-50 Mcg/dose Aepb (Fluticasone-salmeterol) .Marland Kitchen.. 1puff 2 times a day  Other Orders: Mammogram (Screening) (Mammo) Gastroenterology Referral (GI)  Hypertension Assessment/Plan:      The patient's hypertensive risk group is category C: Target organ damage and/or diabetes.  Her calculated 10 year risk of coronary heart disease is 9 %.  Today's blood pressure is 129/80.      Patient Instructions: 1)  Please schedule  a follow-up appointment in 3 months. 2)  We will do a physical exam when you come in. 3)  Keep up the good work with weight loss. AND start walking!!! 4)  We will schedule you for a mammogram. I know it will hurt for a little bit but it is important. 5)  We will schedule you for a colonoscopy. 6)  We are getting fasting lab work today and will let you know about it when it returns. 7)  I have refilled all of your medications for a year. 8)  Your 2 inhalers had plenty of refills. 9)  TAKE CARE!!! Prescriptions: PRAVASTATIN SODIUM 80 MG TABS (PRAVASTATIN SODIUM) Take 1 tablet by mouth once a day  #90 x 4   Entered and Authorized by:   Zoila Shutter MD   Signed by:   Zoila Shutter MD on 01/12/2010   Method used:   Electronically to        [facility] [address] [phone]* (retail)       [address], [place]  [address]       Ph: [phone]       Fax: [phone]   RxID:   [id number] LISINOPRIL 10 MG TABS (LISINOPRIL) Take 1 tablet by mouth once a day  #90 x 4   Entered and Authorized by:   Zoila Shutter MD   Signed by:   Zoila Shutter MD on 01/12/2010   Method used:   Electronically to        [facility] [address] [phone]* (retail)       [address] [place], [place]  [address]       Ph: [phone]       Fax: [phone]   RxID:   [id number] HYDROCHLOROTHIAZIDE 25 MG TABS (HYDROCHLOROTHIAZIDE) Take 1 tablet by mouth once a day  #90 x 4   Entered and Authorized by:   Zoila Shutter MD   Signed by:   Zoila Shutter MD on 01/12/2010   Method used:   Electronically to        [facility] [address] [phone]* (retail)       [address] [place], [place]  [address]       Ph: [phone]       Fax: [phone]   RxID:   [id number]   Prevention & Chronic Care Immunizations   Influenza vaccine: Fluvax Non-MCR  (09/29/2009)   Influenza vaccine due: 09/24/2008    Tetanus booster: 09/29/2009: Tdap    Pneumococcal vaccine: Pneumovax   (09/29/2009)   Pneumococcal vaccine due: None    H. zoster vaccine: Not documented  Colorectal Screening   Hemoccult: normal  (04/18/2008)   Hemoccult due: 04/18/2009    Colonoscopy: Not documented   Colonoscopy action/deferral: GI referral  (01/12/2010)  Other Screening   Pap smear: Normal  (04/25/2006)   Pap smear due: 04/26/2007    Mammogram: Assessment: BIRADS 1.   (04/17/2008)   Mammogram action/deferral: Ordered  (01/12/2010)   Mammogram due: 04/2009    DXA bone density scan:  Lumbar Spine:  T Score > -1.0 Spine.  Hip Total: T Score > -1.0 Hip.    (04/17/2008)   DXA scan due: 04/2010    Smoking status: never  (01/12/2010)  Diabetes Mellitus   HgbA1C: 7.1  (01/12/2010)    Eye exam: Not documented   Diabetic eye exam action/deferral: Deferred  (01/12/2010)    Foot exam: Not documented   Foot exam action/deferral: Deferred   High risk foot: Not documented   Foot care education: Not documented    Urine microalbumin/creatinine ratio: Not documented   Urine microalbumin action/deferral: Deferred    Diabetes flowsheet reviewed?: Yes   Progress toward A1C goal: At goal  Lipids   Total Cholesterol: 168  (09/04/2009)   LDL: 91  (09/04/2009)   LDL Direct: Not documented   HDL: 60  (09/04/2009)   Triglycerides: 84  (09/04/2009)    SGOT (AST): 13  (01/13/2009)   SGPT (ALT): 9  (01/13/2009) CMP ordered    Alkaline phosphatase: 145  (01/13/2009)   Total bilirubin: 1.1  (01/13/2009)    Lipid flowsheet reviewed?: Yes   Progress toward LDL goal: At goal  Hypertension   Last Blood Pressure: 129 / 80  (01/12/2010)   Serum creatinine: 0.84  (06/23/2009)   Serum potassium 4.0  (06/23/2009) CMP  ordered     Hypertension flowsheet reviewed?: Yes   Progress toward BP goal: At goal  Self-Management Support :   Personal Goals (by the next clinic visit) :     Personal A1C goal: 7  (01/12/2010)     Personal blood pressure goal: 130/80  (01/12/2010)     Personal LDL  goal: 100  (01/12/2010)    Patient will work on the following items until the next clinic visit to reach self-care goals:     Medications and monitoring: take my medicines every day  (01/12/2010)     Eating: eat more vegetables  (09/29/2009)     Activity: take a 30 minute walk every day  (01/12/2010)    Diabetes self-management support: Not documented    Hypertension self-management support: Not documented    Lipid self-management support: Not documented    Nursing Instructions: GI referral for screening colonoscopy (see order) Schedule screening mammogram (see order)   Process Orders Check Orders Results:     [facility]: Check successful Tests Sent for requisitioning (January 12, 2010 3:34 PM):     01/12/2010: [facility] -- T-Comprehensive Metabolic Panel [[id number]] (signed)     01/12/2010: [facility] -- T-Lipid Profile [phone] (signed)    Laboratory Results   Blood Tests   Date/Time Received: January 12, 2010 9:58 AM Date/Time Reported: Alric Quan  January 12, 2010 9:58 AM  HGBA1C: 7.1%   (Normal Range: Non-Diabetic - 3-6%   Control Diabetic - 6-8%) CBG Fasting:: 134mg /dL

## 2010-12-28 NOTE — Letter (Signed)
Summary: [facility] Instructions  [facility]  [address] [place], [place] [address]   Phone: [phone]  Fax: [phone]       Nicole Baxter    12-28-1940    MRN: [id number]        Procedure Day Dorna Bloom:  Wednesday  02/03/10     Arrival Time:  7:30am     Procedure Time:  8:30am     Location of Procedure:                    _X _  [facility] (4th Floor)                        PREPARATION FOR COLONOSCOPY WITH MOVIPREP   Starting 5 days prior to your procedure   Friday 03/04   do not eat nuts, seeds, popcorn, corn, beans, peas,  salads, or any raw vegetables.  Do not take any fiber supplements (e.g. Metamucil, Citrucel, and Benefiber).  THE DAY BEFORE YOUR PROCEDURE         DATE:   03/08   DAY: Tuesday  1.  Drink clear liquids the entire day-NO SOLID FOOD  2.  Do not drink anything colored red or purple.  Avoid juices with pulp.  No orange juice.  3.  Drink at least 64 oz. (8 glasses) of fluid/clear liquids during the day to prevent dehydration and help the prep work efficiently.  CLEAR LIQUIDS INCLUDE: Water Jello Ice Popsicles Tea (sugar ok, no milk/cream) Powdered fruit flavored drinks Coffee (sugar ok, no milk/cream) Gatorade Juice: apple, white grape, white cranberry  Lemonade Clear bullion, consomm, broth Carbonated beverages (any kind) Strained chicken noodle soup Hard Candy                             4.  In the morning, mix first dose of MoviPrep solution:    Empty 1 Pouch A and 1 Pouch B into the disposable container    Add lukewarm drinking water to the top line of the container. Mix to dissolve    Refrigerate (mixed solution should be used within 24 hrs)  5.  Begin drinking the prep at 5:00 p.m. The MoviPrep container is divided by 4 marks.   Every 15 minutes drink the solution down to the next mark (approximately 8 oz) until the full liter is complete.   6.  Follow completed prep with 16 oz of clear liquid of your  choice (Nothing red or purple).  Continue to drink clear liquids until bedtime.  7.  Before going to bed, mix second dose of MoviPrep solution:    Empty 1 Pouch A and 1 Pouch B into the disposable container    Add lukewarm drinking water to the top line of the container. Mix to dissolve    Refrigerate  THE DAY OF YOUR PROCEDURE      DATE:  03/09  DAY:  Wednesday  Beginning at  3:30 a.m. (5 hours before procedure):         1. Every 15 minutes, drink the solution down to the next mark (approx 8 oz) until the full liter is complete.  2. Follow completed prep with 16 oz. of clear liquid of your choice.    3. You may drink clear liquids until   6:30am  (2 HOURS BEFORE PROCEDURE).   MEDICATION INSTRUCTIONS  Unless otherwise instructed, you should take regular prescription  medications with a small sip of water   as early as possible the morning of your procedure. re).  Additional medication instructions: Do not take HCTZ morning of procedure.         OTHER INSTRUCTIONS  You will need a responsible adult at least 68 years of age to accompany you and drive you home.   This person must remain in the waiting room during your procedure.  Wear loose fitting clothing that is easily removed.  Leave jewelry and other valuables at home.  However, you may wish to bring a book to read or  an iPod/MP3 player to listen to music as you wait for your procedure to start.  Remove all body piercing jewelry and leave at home.  Total time from sign-in until discharge is approximately 2-3 hours.  You should go home directly after your procedure and rest.  You can resume normal activities the  day after your procedure.  The day of your procedure you should not:   Drive   Make legal decisions   Operate machinery   Drink alcohol   Return to work  You will receive specific instructions about eating, activities and medications before you leave.    The above instructions have been  reviewed and explained to me by   Ezra Sites RN  January 25, 2010 10:40 AM    I fully understand and can verbalize these instructions _____________________________ Date _________

## 2010-12-28 NOTE — Assessment & Plan Note (Signed)
Summary: FLU SHOT/DS  Nurse Visit   Allergies: No Known Drug Allergies  Immunizations Administered:  Influenza Vaccine # 1:    Vaccine Type: Fluvax MCR    Site: right deltoid    Mfr: GlaxoSmithKline    Dose: 0.5 ml    Route: IM    Given by: Angelina Ok RN    Exp. Date: 05/28/2011    Lot #: [id number]    VIS given: 06/22/10 version given August 20, 2010.  Flu Vaccine Consent Questions:    Do you have a history of severe allergic reactions to this vaccine? no    Any prior history of allergic reactions to egg and/or gelatin? no    Do you have a sensitivity to the preservative Thimersol? no    Do you have a past history of Guillan-Barre Syndrome? no    Do you currently have an acute febrile illness? no    Have you ever had a severe reaction to latex? no    Vaccine information given and explained to patient? yes    Are you currently pregnant? no  Orders Added: 1)  Influenza Vaccine MCR [00025]

## 2010-12-28 NOTE — Procedures (Signed)
Summary: Colonoscopy  Patient: [name] Note: All result statuses are Final unless otherwise noted.  Tests: (1) Colonoscopy (COL)   COL Colonoscopy           DONE     [facility]     [address]     [place], [place]  [address]           COLONOSCOPY PROCEDURE REPORT           PATIENT:  Nicole Baxter, Nicole Baxter  MR#:  [id number]     BIRTHDATE:  [birth year], 68 yrs. old  GENDER:  female           ENDOSCOPIST:  Barbette Hair. Arlyce Dice, MD     Referred by:           PROCEDURE DATE:  02/03/2010     PROCEDURE:  Colonoscopy with polypectomy and submucosal injection     ASA CLASS:  Class II     INDICATIONS:  Routine Risk Screening           MEDICATIONS:   Fentanyl 75 mcg IV, Versed 7 mg IV, Benadryl 25 mg     IV           DESCRIPTION OF PROCEDURE:   After the risks benefits and     alternatives of the procedure were thoroughly explained, informed     consent was obtained.  Digital rectal exam was performed and     revealed no abnormalities.   The LB CF-[id number] endoscope     was introduced through the anus and advanced to the cecum, which     was identified by both the appendix and ileocecal valve, without     limitations.  The quality of the prep was good, using MoviPrep.     The instrument was then slowly withdrawn as the colon was fully     examined.     <<PROCEDUREIMAGES>>           FINDINGS:  A sessile polyp was found in the cecum.   It was 1.5 cm     in size. submucosal injection 6cc NS was injected submucosally.     Polyp was then removed with hot polypectomy snare and submitted to     pathology (see image4 and image5).  A sessile polyp was found in     the ascending colon. It was 2 mm in size. Polyp was snared without     cautery. Retrieval was successful (see image6). snare polyp     Scattered diverticula were found ascending colon to sigmoid colon     (see image2).  This was otherwise a normal examination of the     colon (see image3, image8, image9, image10, and  image13).     Retroflexed views in the rectum revealed no abnormalities.    The     scope was then withdrawn from the patient and the procedure     completed.           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           ______________________________     Barbette Hair. Arlyce Dice, MD           CC:  Tilford Pillar MD           n.     Rosalie DoctorBarbette Hair. [place] at 02/03/2010 09:37 AM           Kathy Breach, [phone]  Note: An exclamation mark (!) indicates a result that was not dispersed into the flowsheet. Document Creation Date: 02/03/2010 9:37 AM _______________________________________________________________________  (1) Order result status: Final Collection or observation date-time: 02/03/2010 09:27 Requested date-time:  Receipt date-time:  Reported date-time:  Referring Physician:   Ordering Physician: Melvia Heaps [phone]) Specimen Source:  Source: Launa Grill Order Number: [id number] Lab site:   Appended Document: Colonoscopy     Procedures Next Due Date:    Colonoscopy: 01/2013

## 2010-12-28 NOTE — Assessment & Plan Note (Signed)
Summary: est-ck/fu/meds/cfb   Vital Signs:  Patient profile:   70 year old female Height:      61.25 inches (155.57 cm) Weight:      197.9 pounds (89.95 kg) BMI:     37.22 Temp:     97.3 degrees F (36.28 degrees C) oral Pulse rate:   78 / minute BP sitting:   114 / 79  (right arm)  Vitals Entered By: Cynda Familia Duncan Dull) (Apr 06, 2010 10:18 AM) Is Patient Diabetic? No Pain Assessment Patient in pain? no      Nutritional Status BMI of > 30 = obese  Have you ever been in a relationship where you felt threatened, hurt or afraid?No   Does patient need assistance? Functional Status Self care Ambulation Normal   Primary Care Provider:  Zoila Shutter MD   History of Present Illness: 70 year old who returns for follow up. She is also here for a physical exam and pap smear today. She has had her mammogram and colonoscopy done since I last saw her and they are fine.  She has been walking "a few times since I last saw her."   Her breathing has been fine even with the pollen this spring.  Her sugars have been high in the past but she was never told that she had diabetes. She was never told the metformin that was presribed to her right before I took over her care.       Preventive Screening-Counseling & Management  Alcohol-Tobacco     Alcohol drinks/day: 0     Smoking Status: never     Passive Smoke Exposure: no  Current Medications (verified): 1)  Hydrochlorothiazide 25 Mg Tabs (Hydrochlorothiazide) .... Take 1 Tablet By Mouth Once A Day 2)  Prilosec 40 Mg Cpdr (Omeprazole) .... Take 1 Tablet By Mouth Once A Day 3)  Amlodipine Besylate 5 Mg Tabs (Amlodipine Besylate) .... Take 1 Tablet By Mouth Once A Day 4)  Proair Hfa 108 (90 Base) Mcg/act Aers (Albuterol Sulfate) .... 2 Puffs Every 4-6 Hours As Needed For Shortness of Breath 5)  Lisinopril 10 Mg Tabs (Lisinopril) .... Take 1 Tablet By Mouth Once A Day 6)  Pravastatin Sodium 80 Mg Tabs (Pravastatin Sodium) ....  Take 1 Tablet By Mouth Once A Day 7)  Advair Diskus 500-50 Mcg/dose Aepb (Fluticasone-Salmeterol) .Marland Kitchen.. 1puff 2 Times A Day  Allergies (verified): No Known Drug Allergies  Past History:  Past Medical History: Reviewed history from 09/27/2006 and no changes required. 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 Mother died of MI age 65. Father died of unknown cause age unknown. Alcoholic. Brother died of cancer ? type Brother died of "bone cancer" Family History Diabetes 1st degree relative - Mother  Social History: Reviewed history from 01/13/2009 and no changes required. Occupation: [employer] x 31 yrs. Married  5 children - 1 with sarcoidosis; daugher with COPD Never Smoked Alcohol use-no Drug use-no Regular exercise-no  Review of Systems General:  Denies fatigue, fever, and weakness. CV:  Denies chest pain or discomfort and palpitations. Resp:  Denies cough and shortness of breath. GI:  Denies abdominal pain, constipation, and diarrhea. MS:  Denies joint pain.  Physical Exam  General:  alert and well-developed.   Head:  normocephalic and atraumatic.   Eyes:  vision grossly intact,  pupils equal, pupils round, and pupils reactive to light.   Ears:  R ear normal and L ear normal.   Mouth:  pharynx pink and moist.   Neck:  supple, full ROM, and no masses. carotids +2/2 without bruits Chest Wall:  no deformities.   Breasts:  no masses and no abnormal thickening.   Lungs:  normal respiratory effort and normal breath sounds.   Heart:  normal rate, regular rhythm, and no murmur.   Rectal:  not done as pt. just had colonoscopy Genitalia:  normal extrnal genitalia, nl appearing vaginal vault, pap smear obtained with broom, bimanual exam without abnormality Msk:   normal ROM.   Pulses:  DP and PT +2/2 bilat Extremities:  no edema Neurologic:  alert & oriented X3, strength normal in all extremities, and sensation intact to light touch.     Impression & Recommendations:  Problem # 1:  SCREENING FOR MALIGNANT NEOPLASM OF THE CERVIX (ICD-V76.2) Assessment Unchanged  Orders: T-PAP [facility]) [phone])  Problem # 2:  PREVENTIVE HEALTH CARE (ICD-V70.0) Patient had normal exam and is up to date on health maintenance screening.  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 Her updated medication list for this problem includes:    Proair Hfa 108 (90 Base) Mcg/act Aers (Albuterol sulfate) .Marland Kitchen... 2 puffs every 4-6 hours as needed for shortness of breath    Advair Diskus 500-50 Mcg/dose Aepb (Fluticasone-salmeterol) .Marland Kitchen... 1puff 2 times a day  Problem # 4:  HYPERTENSION (ICD-401.9) Well-controlled. Her updated medication list for this problem includes:    Hydrochlorothiazide 25 Mg Tabs (Hydrochlorothiazide) .Marland Kitchen... Take 1 tablet by mouth once a day    Amlodipine Besylate 5 Mg Tabs (Amlodipine besylate) .Marland Kitchen... Take 1 tablet by mouth once a day    Lisinopril 10 Mg Tabs (Lisinopril) .Marland Kitchen... Take 1 tablet by mouth once a day  Complete Medication List: 1)  Hydrochlorothiazide 25 Mg Tabs (Hydrochlorothiazide) .... Take 1 tablet by mouth once a day 2)  Prilosec 40 Mg Cpdr (Omeprazole) .... Take 1 tablet by mouth once a day 3)  Amlodipine Besylate 5 Mg Tabs (Amlodipine besylate) .... Take 1 tablet by mouth once a day 4)  Proair Hfa 108 (90 Base) Mcg/act Aers (Albuterol sulfate) .... 2 puffs every 4-6 hours as needed for shortness of breath 5)  Lisinopril 10 Mg Tabs (Lisinopril) .... Take 1 tablet by mouth once a day 6)  Pravastatin Sodium 80 Mg Tabs (Pravastatin sodium) .... Take 1 tablet by mouth once a day 7)  Advair Diskus 500-50 Mcg/dose Aepb (Fluticasone-salmeterol) .Marland Kitchen.. 1puff 2 times a day  Patient Instructions: 1)   Please schedule a follow-up appointment in 3 months. 2)  Please come fasting to your appointment. 3)  WALK REGULARLY!!! I know you can do it. 4)  I'm glad your breathing is good.  Prevention & Chronic Care Immunizations   Influenza vaccine: Fluvax Non-MCR  (09/29/2009)   Influenza vaccine due: 09/24/2008    Tetanus booster: 09/29/2009: Tdap    Pneumococcal vaccine: Pneumovax  (09/29/2009)   Pneumococcal vaccine due: None    H. zoster vaccine: Not documented  Colorectal Screening   Hemoccult: normal  (04/18/2008)   Hemoccult due: 04/18/2009    Colonoscopy: DONE  (02/03/2010)   Colonoscopy action/deferral: GI referral  (01/12/2010)   Colonoscopy due: 01/2013  Other Screening   Pap smear: Normal  (04/25/2006)   Pap smear action/deferral: Ordered  (04/06/2010)   Pap smear due: 04/26/2007    Mammogram:  ASSESSMENT: Negative - BI-RADS 1^MM DIGITAL SCREENING  (01/19/2010)   Mammogram action/deferral: Ordered  (01/12/2010)   Mammogram due: 04/2009    DXA bone density scan:  Lumbar Spine:  T Score > -1.0 Spine.  Hip Total: T Score > -1.0 Hip.    (04/17/2008)   DXA scan due: 04/2010    Smoking status: never  (04/06/2010)  Diabetes Mellitus   HgbA1C: 7.1  (01/12/2010)    Eye exam: Not documented   Diabetic eye exam action/deferral: Deferred  (01/12/2010)    Foot exam: Not documented   Foot exam action/deferral: Deferred   High risk foot: Not documented   Foot care education: Not documented    Urine microalbumin/creatinine ratio: Not documented   Urine microalbumin action/deferral: Deferred  Lipids   Total Cholesterol: 183  (01/12/2010)   LDL: 111  (01/12/2010)   LDL Direct: Not documented   HDL: 55  (01/12/2010)   Triglycerides: 87  (01/12/2010)    SGOT (AST): 13  (01/12/2010)   SGPT (ALT): 12  (01/12/2010)   Alkaline phosphatase: 136  (01/12/2010)   Total bilirubin: 0.9  (01/12/2010)    Lipid flowsheet reviewed?: Yes   Progress toward LDL goal: At  goal  Hypertension   Last Blood Pressure: 114 / 79  (04/06/2010)   Serum creatinine: 0.78  (01/12/2010)   Serum potassium 3.7  (01/12/2010)    Hypertension flowsheet reviewed?: Yes   Progress toward BP goal: At goal  Self-Management Support :   Personal Goals (by the next clinic visit) :     Personal A1C goal: 7  (01/12/2010)     Personal blood pressure goal: 130/80  (01/12/2010)     Personal LDL goal: 100  (01/12/2010)    Patient will work on the following items until the next clinic visit to reach self-care goals:     Medications and monitoring: take my medicines every day  (04/06/2010)     Eating: eat foods that are low in salt, eat baked foods instead of fried foods  (04/06/2010)     Activity: take a 30 minute walk every day  (04/06/2010)    Diabetes self-management support: Resources for patients handout  (04/06/2010)    Hypertension self-management support: Pre-printed educational material, Written self-care plan, Resources for patients handout  (04/06/2010)   Hypertension self-care plan printed.    Lipid self-management support: Pre-printed educational material, Written self-care plan, Resources for patients handout  (04/06/2010)   Lipid self-care plan printed.      Resource handout printed.   Nursing Instructions: Pap smear today

## 2010-12-28 NOTE — Letter (Signed)
Summary: Previsit letter  [facility]  [address] [place], [place] [address]   Phone: [phone]  Fax: [phone]       01/12/2010 MRN: [id number]  [name] [address] [place], [place]  [address]  Dear [name],  Welcome to the Gastroenterology Division at [facility].    You are scheduled to see a nurse for your pre-procedure visit on 01-25-10 at 10:30a.m. on the 3rd floor at [facility], [address]  We ask that you try to arrive at our office 15 minutes prior to your appointment time to allow for check-in.  Your nurse visit will consist of discussing your medical and surgical history, your immediate family medical history, and your medications.    Please bring a complete list of all your medications or, if you prefer, bring the medication bottles and we will list them.  We will need to be aware of both prescribed and over the counter drugs.  We will need to know exact dosage information as well.  If you are on blood thinners (Coumadin, Plavix, Aggrenox, Ticlid, etc.) please call our office today/prior to your appointment, as we need to consult with your physician about holding your medication.   Please be prepared to read and sign documents such as consent forms, a financial agreement, and acknowledgement forms.  If necessary, and with your consent, a friend or relative is welcome to sit-in on the nurse visit with you.  Please bring your insurance card so that we may make a copy of it.  If your insurance requires a referral to see a specialist, please bring your referral form from your primary care physician.  No co-pay is required for this nurse visit.     If you cannot keep your appointment, please call [phone] to cancel or reschedule prior to your appointment date.  This allows [name] the opportunity to schedule an appointment for another patient in need of care.    Thank you for choosing [facility] for your medical needs.  We  appreciate the opportunity to care for you.  Please visit [name] at our website  to learn more about our practice.                     Sincerely.                                                                                                                   The Gastroenterology Division

## 2010-12-28 NOTE — Letter (Signed)
Summary: Patient Notice- Polyp Results  [facility]  [address] [place], [place] [address]   Phone: [phone]  Fax: [phone]        February 08, 2010 MRN: [id number]    [name] [address] [place], [place]  [address]    Dear [name],  I am pleased to inform you that the colon polyp(s) removed during your recent colonoscopy was (were) found to be benign (no cancer detected) upon pathologic examination.  I recommend you have a repeat colonoscopy examination in 3_ years to look for recurrent polyps, as having colon polyps increases your risk for having recurrent polyps or even colon cancer in the future.  Should you develop new or worsening symptoms of abdominal pain, bowel habit changes or bleeding from the rectum or bowels, please schedule an evaluation with either your primary care physician or with me.  Additional information/recommendations:  __ No further action with gastroenterology is needed at this time. Please      follow-up with your primary care physician for your other healthcare      needs.  __ Please call [phone] to schedule a return visit to review your      situation.  __ Please keep your follow-up visit as already scheduled.  __x Continue treatment plan as outlined the day of your exam.  Please call us if you are having persistent problems or have questions about your condition that have not been fully answered at this time.  Sincerely,  Louis Meckel MD  This letter has been electronically signed by your physician.  Appended Document: Patient Notice- Polyp Results Letter mailed 3.15.11

## 2010-12-28 NOTE — Assessment & Plan Note (Signed)
Summary: EST-CK/FU/MEDS/CFB   Vital Signs:  Patient profile:   70 year old female Height:      61.25 inches (155.57 cm) Weight:      198.4 pounds (90.18 kg) BMI:     37.32 Temp:     97.1 degrees F oral Pulse rate:   98 / minute BP sitting:   126 / 79  (left arm) Cuff size:   large  Vitals Entered By: Cynda Familia Duncan Dull) (July 13, 2010 9:21 AM) CC: routine f/u, hand shaking -off and on x 2-3 wks Is Patient Diabetic? No Pain Assessment Patient in pain? no      Nutritional Status BMI of > 30 = obese  Have you ever been in a relationship where you felt threatened, hurt or afraid?No   Does patient need assistance? Functional Status Self care Ambulation Normal   Primary Care Provider:  Zoila Shutter MD  CC:  routine f/u and hand shaking -off and on x 2-3 wks.  History of Present Illness: 70 yr old who comes in for 3 month follow up. She is doing well. She has not been walking and has gained 1 pound. 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 She does take aleve 1-2 times a week for this.  Her breathing has been good even with the hot weather. She has not needed her rescue inhaler.  She is fasting today for lipids. She is also due to have a HbA1c checked as she has had hyperglycemia in the past.   She has noticed a new tremor in both of her hands in the last few weeks. When I ask she does remember her mother having one as well. At times her L hand-the palmar surface-feels a bit numb.  Preventive Screening-Counseling & Management  Alcohol-Tobacco     Alcohol drinks/day: 0     Smoking Status: never     Passive Smoke Exposure: no  Current Medications (verified): 1)  Hydrochlorothiazide 25 Mg Tabs (Hydrochlorothiazide) .... Take 1 Tablet By Mouth Once A Day 2)  Prilosec 40 Mg Cpdr (Omeprazole) .... Take 1 Tablet By Mouth Once A Day 3)  Proair Hfa 108 (90 Base) Mcg/act Aers (Albuterol Sulfate) .... 2 Puffs Every 4-6 Hours As Needed For Shortness of Breath 4)  Lisinopril  10 Mg Tabs (Lisinopril) .... Take 1 Tablet By Mouth Once A Day 5)  Pravastatin Sodium 80 Mg Tabs (Pravastatin Sodium) .... Take 1 Tablet By Mouth Once A Day 6)  Advair Diskus 500-50 Mcg/dose Aepb (Fluticasone-Salmeterol) .Marland Kitchen.. 1puff 2 Times A Day  Allergies (verified): No Known Drug Allergies  Past History:  Past Medical History: Reviewed history from 09/27/2006 and no changes required. 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 Mother died of MI age 31. Father died of unknown cause age unknown. Alcoholic. Brother died of cancer ? type Brother died of "bone cancer" Family History Diabetes 1st degree relative - Mother  Social History: Reviewed history from 01/13/2009 and no changes required. Occupation: [employer] x 31 yrs. Married  5 children - 1 with sarcoidosis; daugher with COPD Never Smoked Alcohol use-no Drug use-no Regular exercise-no  Review of Systems       as per HPI  Physical Exam  General:  alert and well-developed.   Head:  normocephalic and atraumatic.   Eyes:  vision grossly  intact.   Neck:  supple.   Lungs:  normal respiratory effort and normal breath sounds.   Heart:  normal rate, regular rhythm, and no murmur.   Abdomen:  soft, non-tender, and normal bowel sounds.   Extremities:  no edema Neurologic:  motor- nl grip, and +5/5 upper extremities, normal to light tough in the upper extremities   Impression & Recommendations:  Problem # 1:  HYPERTENSION (ICD-401.9) Excellent control. Continue present meds. The following medications were removed from the medication list:    Amlodipine Besylate 5 Mg Tabs (Amlodipine besylate) .Marland Kitchen... Take 1 tablet by mouth once a day Her updated medication list for this problem includes:     Hydrochlorothiazide 25 Mg Tabs (Hydrochlorothiazide) .Marland Kitchen... Take 1 tablet by mouth once a day    Lisinopril 10 Mg Tabs (Lisinopril) .Marland Kitchen... Take 1 tablet by mouth once a day  Orders: T-CMP with Estimated GFR ([id number])  Problem # 2:  HYPERLIPIDEMIA (ICD-272.4) On pravastatin, checking lipid and liver panels today. Her updated medication list for this problem includes:    Pravastatin Sodium 80 Mg Tabs (Pravastatin sodium) .Marland Kitchen... 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 Her neurologic exam is completely normal. She does have some paresthesias in the left hand. I am going to check a TSH as her pulse is up a bit as well. Orders: T-TSH ([id number])  Problem # 4:  OBESITY (ICD-278.00) Walking is again encouraged.  Problem # 5:  DIABETES MELLITUS, TYPE II (ICD-250.00)  ? validity  of this diagnosis. Will check HbA1c today. Pt. to return in 6 months for follow up. Her updated medication list for this problem includes:    Lisinopril 10 Mg Tabs (Lisinopril) .Marland Kitchen... Take 1 tablet by mouth once a day  Orders: T-Hgb A1C (in-house) ([id number]) T- Capillary Blood Glucose (61607)  Complete Medication List: 1)  Hydrochlorothiazide 25 Mg Tabs (Hydrochlorothiazide) .... Take 1 tablet by mouth once a day 2)  Prilosec 40 Mg Cpdr (Omeprazole) .... Take 1 tablet by mouth once a day 3)  Proair Hfa 108 (90 Base) Mcg/act Aers (Albuterol sulfate) .... 2 puffs every 4-6 hours as needed for shortness of breath 4)  Lisinopril 10 Mg Tabs (Lisinopril) .... Take 1 tablet by mouth once a day 5)  Pravastatin Sodium 80 Mg Tabs (Pravastatin sodium) .... Take 1 tablet by mouth once a day 6)  Advair Diskus 500-50 Mcg/dose Aepb (Fluticasone-salmeterol) .Marland Kitchen.. 1puff 2 times a day  Patient Instructions: 1)  Please  schedule a follow-up appointment in 6 months. 2)  Have a good 6 months-try to walk :)!!! 3)  I will let you know when your labs return. 4)  I think the shaking in your hands is just a familial intention tremor and nothing to worry about. Process Orders Check Orders Results:     [facility]: Check successful Tests Sent for requisitioning (July 13, 2010 3:35 PM):     07/13/2010: [facility] -- T-Lipid Profile [phone] (signed)     07/13/2010: [facility] -- T-CMP with Estimated GFR [[id number]] (signed)     07/13/2010: [facility] -- T-TSH [phone] (signed)     Prevention & Chronic Care Immunizations   Influenza vaccine: Fluvax Non-MCR  (09/29/2009)   Influenza vaccine due: 09/24/2008    Tetanus booster: 09/29/2009: Tdap  Pneumococcal vaccine: Pneumovax  (09/29/2009)   Pneumococcal vaccine due: None    H. zoster vaccine: Not documented  Colorectal Screening   Hemoccult: normal  (04/18/2008)   Hemoccult due: 04/18/2009    Colonoscopy: DONE  (02/03/2010)   Colonoscopy action/deferral: GI referral  (01/12/2010)   Colonoscopy due: 01/2013  Other Screening   Pap smear: NEGATIVE FOR INTRAEPITHELIAL LESIONS OR MALIGNANCY.  (04/06/2010)   Pap smear action/deferral: Ordered  (04/06/2010)   Pap smear due: 04/26/2007    Mammogram: ASSESSMENT: Negative - BI-RADS 1^MM DIGITAL SCREENING  (01/19/2010)   Mammogram action/deferral: Ordered  (01/12/2010)   Mammogram due: 04/2009    DXA bone density scan:  Lumbar Spine:  T Score > -1.0 Spine.  Hip Total: T Score > -1.0 Hip.    (04/17/2008)   DXA scan due: 04/2010    Smoking status: never  (07/13/2010)  Diabetes Mellitus   HgbA1C: 7.2  (07/13/2010)    Eye exam: Not documented   Diabetic eye exam action/deferral: Deferred  (01/12/2010)    Foot exam: Not documented   Foot exam action/deferral: Deferred   High risk foot: Not documented   Foot care  education: Not documented    Urine microalbumin/creatinine ratio: Not documented   Urine microalbumin action/deferral: Deferred    Diabetes flowsheet reviewed?: Yes   Progress toward A1C goal: At goal  Lipids   Total Cholesterol: 183  (01/12/2010)   LDL: 111  (01/12/2010)   LDL Direct: Not documented   HDL: 55  (01/12/2010)   Triglycerides: 87  (01/12/2010)    SGOT (AST): 13  (01/12/2010)   SGPT (ALT): 12  (01/12/2010)   Alkaline phosphatase: 136  (01/12/2010)   Total bilirubin: 0.9  (01/12/2010)    Lipid flowsheet reviewed?: Yes   Progress toward LDL goal: Unchanged  Hypertension   Last Blood Pressure: 126 / 79  (07/13/2010)   Serum creatinine: 0.78  (01/12/2010)   Serum potassium 3.7  (01/12/2010)    Hypertension flowsheet reviewed?: Yes   Progress toward BP goal: At goal  Self-Management Support :   Personal Goals (by the next clinic visit) :     Personal A1C goal: 7  (01/12/2010)     Personal blood pressure goal: 130/80  (01/12/2010)     Personal LDL goal: 100  (01/12/2010)    Patient will work on the following items until the next clinic visit to reach self-care goals:     Medications and monitoring: take my medicines every day  (07/13/2010)     Eating: eat foods that are low in salt, eat baked foods instead of fried foods  (07/13/2010)     Activity: join a walking program  (07/13/2010)    Diabetes self-management support: Resources for patients handout, Written self-care plan  (07/13/2010)   Diabetes care plan printed    Hypertension self-management support: Resources for patients handout, Written self-care plan  (07/13/2010)   Hypertension self-care plan printed.    Lipid self-management support: Resources for patients handout, Written self-care plan  (07/13/2010)   Lipid self-care plan printed.      Resource handout printed.   Laboratory Results   Blood Tests   Date/Time Received: July 13, 2010 10:12 AM Date/Time Reported: Burke Keels  July 13, 2010 10:12 AM    HGBA1C: 7.2%   (Normal Range: Non-Diabetic - 3-6%   Control Diabetic - 6-8%) CBG Fasting:: 147mg /dL

## 2010-12-30 NOTE — Assessment & Plan Note (Signed)
Summary: asthma, wants breathing treatment/pcp-woodyear/hla   Vital Signs:  Patient profile:   70 year old female Height:      61.25 inches (155.57 cm) Weight:      195.06 pounds (88.66 kg) BMI:     36.69 O2 Sat:      99 % on Room air Temp:     98.2 degrees F (36.78 degrees C) oral Pulse rate:   83 / minute BP sitting:   148 / 88  (right arm) Cuff size:   large  Vitals Entered By: Angelina Ok RN (November 12, 2010 9:17 AM)  O2 Flow:  Room air Is Patient Diabetic? No Pain Assessment Patient in pain? no      Nutritional Status BMI of > 30 = obese CBG Result 139  Have you ever been in a relationship where you felt threatened, hurt or afraid?No   Does patient need assistance? Functional Status Self care Ambulation Normal Comments Coughing up brownish mucous.  Takes a cough drop. Gets relief.  Wants a treatment.  Shortness of breath when she walks a short distance.     Primary Care Nicole Baxter:  Nicole Shutter MD   History of Present Illness: 70 year old woman with pmh significant for asthma, HTN, HLD, who presents to the clinic for the follwing:  1) Sinus congestion with cough - Patient c/o cough with occasional productive sputum, runny nose, an sinus congestion for about 1 week. She denies shortness of breath, chest pain, sore throat, HA, aches and pains, and denies sick contacts at home. She had to use her rescue inhaler 1 time yesterday for added relief. She denies wheezing.   She denies abdominal pain, N/V, changes in urinary or bowel habits.   Depression History:      The patient denies a depressed mood most of the day and a diminished interest in her usual daily activities.         Preventive Screening-Counseling & Management  Alcohol-Tobacco     Alcohol drinks/day: 0     Smoking Status: never     Passive Smoke Exposure: no  Current Medications (verified): 1)  Hydrochlorothiazide 25 Mg Tabs (Hydrochlorothiazide) .... Take 1 Tablet By Mouth Once A Day 2)   Prilosec 40 Mg Cpdr (Omeprazole) .... Take 1 Tablet By Mouth Once A Day 3)  Proair Hfa 108 (90 Base) Mcg/act Aers (Albuterol Sulfate) .... 2 Puffs Every 4-6 Hours As Needed For Shortness of Breath 4)  Lisinopril 10 Mg Tabs (Lisinopril) .... Take 1 Tablet By Mouth Once A Day 5)  Pravastatin Sodium 80 Mg Tabs (Pravastatin Sodium) .... Take 1 Tablet By Mouth Once A Day 6)  Advair Diskus 500-50 Mcg/dose Aepb (Fluticasone-Salmeterol) .Marland Kitchen.. 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 Father died of unknown cause age unknown. Alcoholic. Brother died of cancer ? type Brother died of "bone cancer" Family History Diabetes 1st degree relative - Mother  Social History: Last updated: 29-Jan-2009 Occupation: [employer] x 31 yrs. Married  5 children - 1 with sarcoidosis; daugher with COPD Never Smoked Alcohol use-no Drug use-no Regular exercise-no  Risk Factors: Alcohol Use: 0 (11/12/2010) Exercise: no (01/12/2010)  Risk Factors: Smoking Status: never (11/12/2010) Passive Smoke  Exposure: no (11/12/2010)  Review of Systems      See HPI  Physical Exam  General:  alert and well-developed.  VS reviewed, BP slightly up Eyes:  vision grossly intact, pupils equal, pupils round, and pupils reactive to light.   Ears:  L ear normal and R cerumen impaction.   Nose:  no external deformity, no external erythema, no nasal discharge, no mucosal pallor, no mucosal edema, and no airflow obstruction.   Mouth:  pharynx pink and moist, no erythema, and no exudates.   Neck:  supple.   Lungs:  normal respiratory effort, no accessory muscle use, normal breath sounds, no  dullness, and no wheezes.   Heart:  normal rate and regular rhythm.   Abdomen:  soft and non-tender.   Msk:  normal ROM.   Extremities:  no edema  Neurologic:  nonfocal    Impression & Recommendations:  Problem # 1:  COUGH (ICD-786.2) Cough is likely secondary to URI vs Postnasal drip. Other causes such as asthma exacerbation are less likely as she has not had to use her rescue inhaler more frequently and she is clinically very stable. Will prescribe tessalon for patient, and advised her to contact clinic if symptoms become worse, ie needing rescue inhaler more frequently. Also discussed possible asthma triggers such as cold air, and to wear protective clothing when going outside.   Problem # 2:  DIABETES MELLITUS, TYPE II (ICD-250.00) Not due for A1C, but she has lost approx 5 lbs. Will continue to follow closely. Pt will come in for labs in February.  Her updated medication list for this problem includes:    Lisinopril 10 Mg Tabs (Lisinopril) .Marland Kitchen... Take 1 tablet by mouth once a day  Orders: Capillary Blood Glucose/CBG ([id number]) Capillary Blood Glucose/CBG (66440)  Labs Reviewed: Creat: 0.83 (07/13/2010)    Reviewed HgBA1c results: 7.3 (09/28/2010)  7.2 (07/13/2010)  Problem # 3:  HYPERTENSION (ICD-401.9) BP slightly up, and this may be secondary to acute URI. Patient was in hurry today, and did not want to get her  BP rechecked or have BMET drawn. Will follow up HTN and appropriate labs at next visit in Feb 2012.  Her updated medication list for this problem includes:    Hydrochlorothiazide 25 Mg Tabs (Hydrochlorothiazide) .Marland Kitchen... Take 1 tablet by mouth once a day    Lisinopril 10 Mg Tabs (Lisinopril) .Marland Kitchen... Take 1 tablet by mouth once a day  BP today: 148/88 Prior BP: 122/64 (09/28/2010)  Prior 10 Yr Risk Heart Disease: 9 % (01/12/2010)  Labs Reviewed: K+: 4.3 (07/13/2010) Creat: : 0.83 (07/13/2010)   Chol: 163 (07/13/2010)   HDL: 56 (07/13/2010)   LDL: 94 (07/13/2010)   TG:  64 (07/13/2010)  Complete Medication List: 1)  Hydrochlorothiazide 25 Mg Tabs (Hydrochlorothiazide) .... Take 1 tablet by mouth once a day 2)  Prilosec 40 Mg Cpdr (Omeprazole) .... Take 1 tablet by mouth once a day 3)  Proair Hfa 108 (90 Base) Mcg/act Aers (Albuterol sulfate) .... 2 puffs every 4-6 hours as needed for shortness of breath 4)  Lisinopril 10 Mg Tabs (Lisinopril) .... Take 1 tablet by mouth once a day 5)  Pravastatin Sodium 80 Mg Tabs (Pravastatin sodium) .... Take 1 tablet by mouth once a day 6)  Advair Diskus 500-50 Mcg/dose Aepb (Fluticasone-salmeterol) .Marland Kitchen.. 1puff 2 times a day 7)  Benzonatate 100 Mg Caps (Benzonatate) .... Take one cap by mouth three times a day as needed for cough  Patient Instructions: 1)  Follow up in  February 2012 with your primary care physician 2)  If your symptoms get worse, new fevers, worsening cough, or shortness of breath, contact the clinic sooner. Prescriptions: BENZONATATE 100 MG CAPS (BENZONATATE) Take one cap by mouth three times a day as needed for cough  #30 x 0   Entered and Authorized by:   Melida Quitter MD   Signed by:   Melida Quitter MD on 11/12/2010   Method used:   Electronically to        [facility] [address] [phone]* (retail)       [address] [place], [place]  [address]       Ph: [phone]       Fax: [phone]   RxID:   [id number]    Orders Added: 1)  Capillary Blood Glucose/CBG [82948] 2)  Capillary Blood Glucose/CBG [82948] 3)  Est. Patient Level III [40347]    Prevention & Chronic Care Immunizations   Influenza vaccine: Fluvax MCR  (08/20/2010)   Influenza vaccine due: 09/24/2008    Tetanus booster: 09/29/2009: Tdap    Pneumococcal vaccine: Pneumovax  (09/29/2009)   Pneumococcal vaccine due: None    H. zoster vaccine: Not documented  Colorectal Screening   Hemoccult: normal  (04/18/2008)   Hemoccult due: 04/18/2009    Colonoscopy: DONE  (02/03/2010)   Colonoscopy action/deferral: GI  referral  (01/12/2010)   Colonoscopy due: 01/2013  Other Screening   Pap smear: NEGATIVE FOR INTRAEPITHELIAL LESIONS OR MALIGNANCY.  (04/06/2010)   Pap smear action/deferral: Ordered  (04/06/2010)   Pap smear due: 04/26/2007    Mammogram: ASSESSMENT: Negative - BI-RADS 1^MM DIGITAL SCREENING  (01/19/2010)   Mammogram action/deferral: Ordered  (01/12/2010)   Mammogram due: 04/2009    DXA bone density scan:  Lumbar Spine:  T Score > -1.0 Spine.  Hip Total: T Score > -1.0 Hip.    (04/17/2008)   DXA scan due: 04/2010    Smoking status: never  (11/12/2010)  Diabetes Mellitus   HgbA1C: 7.3  (09/28/2010)    Eye exam: Not documented   Diabetic eye exam action/deferral: Deferred  (01/12/2010)    Foot exam: yes  (09/28/2010)   Foot exam action/deferral: Do today   High risk foot: No  (09/28/2010)   Foot care education: Done  (09/28/2010)    Urine microalbumin/creatinine ratio: Not documented   Urine microalbumin action/deferral: Deferred    Diabetes flowsheet reviewed?: Yes   Progress toward A1C goal: Unchanged  Lipids   Total Cholesterol: 163  (07/13/2010)   LDL: 94  (07/13/2010)   LDL Direct: Not documented   HDL: 56  (07/13/2010)   Triglycerides: 64  (07/13/2010)    SGOT (AST): 12  (07/13/2010)   SGPT (ALT): 12  (07/13/2010)   Alkaline phosphatase: 112  (07/13/2010)   Total bilirubin: 0.9  (07/13/2010)    Lipid flowsheet reviewed?: Yes   Progress toward LDL goal: Improved  Hypertension   Last Blood Pressure: 148 / 88  (11/12/2010)   Serum creatinine: 0.83  (07/13/2010)   Serum potassium 4.3  (07/13/2010)    Hypertension flowsheet reviewed?: Yes   Progress toward BP goal: Deteriorated  Self-Management Support :   Personal Goals (by the next clinic visit) :     Personal A1C goal: 7  (01/12/2010)     Personal blood pressure goal: 130/80  (01/12/2010)     Personal LDL goal: 100  (01/12/2010)    Patient will work on the following items until the next clinic  visit to reach self-care goals:     Medications and monitoring: take my medicines every day, bring all of my medications to every visit  (11/12/2010)     Eating: drink diet soda or water instead of juice or soda, eat more vegetables, use fresh or frozen vegetables, eat foods that are low in salt, eat baked foods instead of fried foods, eat fruit for snacks and desserts, limit or avoid alcohol  (11/12/2010)     Activity: take a 30 minute walk every day  (11/12/2010)    Diabetes self-management support: Education handout, Psychologist, forensic, Resources for patients handout, Written self-care plan  (11/12/2010)   Diabetes care plan printed   Diabetes education handout printed    Hypertension self-management support: Education handout, Pre-printed educational material, Resources for patients handout, Written self-care plan  (11/12/2010)   Hypertension self-care plan printed.   Hypertension education handout printed    Lipid self-management support: Education handout, Pre-printed educational material, Resources for patients handout, Written self-care plan  (11/12/2010)   Lipid self-care plan printed.   Lipid education handout printed      Resource handout printed.    Vital Signs:  Patient profile:   70 year old female Height:      61.25 inches (155.57 cm) Weight:      195.06 pounds (88.66 kg) BMI:     36.69 O2 Sat:      99 % Temp:     98.2 degrees F (36.78 degrees C) oral Pulse rate:   83 / minute BP sitting:   148 / 88  (right arm) Cuff size:   large  Vitals Entered By: Angelina Ok RN (November 12, 2010 9:17 AM)  O2 Flow:  Room air

## 2011-02-07 LAB — GLUCOSE, CAPILLARY: Glucose-Capillary: 139 mg/dL — ABNORMAL HIGH (ref 70–99)

## 2011-02-11 ENCOUNTER — Other Ambulatory Visit: Payer: Self-pay | Admitting: Internal Medicine

## 2011-02-11 LAB — GLUCOSE, CAPILLARY: Glucose-Capillary: 147 mg/dL — ABNORMAL HIGH (ref 70–99)

## 2011-02-16 LAB — GLUCOSE, CAPILLARY: Glucose-Capillary: 134 mg/dL — ABNORMAL HIGH (ref 70–99)

## 2011-03-07 ENCOUNTER — Encounter: Payer: Self-pay | Admitting: Internal Medicine

## 2011-03-07 DIAGNOSIS — I10 Essential (primary) hypertension: Secondary | ICD-10-CM

## 2011-03-07 DIAGNOSIS — J64 Unspecified pneumoconiosis: Secondary | ICD-10-CM

## 2011-03-07 DIAGNOSIS — K219 Gastro-esophageal reflux disease without esophagitis: Secondary | ICD-10-CM

## 2011-03-07 DIAGNOSIS — E785 Hyperlipidemia, unspecified: Secondary | ICD-10-CM

## 2011-03-07 DIAGNOSIS — E119 Type 2 diabetes mellitus without complications: Secondary | ICD-10-CM

## 2011-03-08 ENCOUNTER — Encounter: Payer: Self-pay | Admitting: Internal Medicine

## 2011-03-08 ENCOUNTER — Ambulatory Visit (INDEPENDENT_AMBULATORY_CARE_PROVIDER_SITE_OTHER): Payer: PRIVATE HEALTH INSURANCE | Admitting: Internal Medicine

## 2011-03-08 VITALS — BP 126/84 | HR 82 | Temp 98.6°F | Ht 60.75 in | Wt 194.3 lb

## 2011-03-08 DIAGNOSIS — G252 Other specified forms of tremor: Secondary | ICD-10-CM

## 2011-03-08 DIAGNOSIS — G250 Essential tremor: Secondary | ICD-10-CM

## 2011-03-08 DIAGNOSIS — E785 Hyperlipidemia, unspecified: Secondary | ICD-10-CM

## 2011-03-08 DIAGNOSIS — E119 Type 2 diabetes mellitus without complications: Secondary | ICD-10-CM

## 2011-03-08 DIAGNOSIS — J64 Unspecified pneumoconiosis: Secondary | ICD-10-CM

## 2011-03-08 DIAGNOSIS — E669 Obesity, unspecified: Secondary | ICD-10-CM

## 2011-03-08 LAB — COMPREHENSIVE METABOLIC PANEL
ALT: 11 U/L (ref 0–35)
AST: 15 U/L (ref 0–37)
Alkaline Phosphatase: 111 U/L (ref 39–117)
Glucose, Bld: 118 mg/dL — ABNORMAL HIGH (ref 70–99)
Sodium: 140 mEq/L (ref 135–145)
Total Bilirubin: 0.7 mg/dL (ref 0.3–1.2)
Total Protein: 7.6 g/dL (ref 6.0–8.3)

## 2011-03-08 LAB — LIPID PANEL
HDL: 67 mg/dL (ref >39)
LDL Cholesterol: 91 mg/dL (ref 0–99)
Total CHOL/HDL Ratio: 2.6 Ratio
VLDL: 15 mg/dL (ref 0–40)

## 2011-03-08 LAB — POCT GLYCOSYLATED HEMOGLOBIN (HGB A1C): Hemoglobin A1C: 6.9

## 2011-03-08 NOTE — Assessment & Plan Note (Signed)
Mrs. Nicole Baxter breathing has been fairly good since I last saw her. It is a little bit worse today because of the pollen count. She is encouraged to come in if she notices that she is waking at night unable to breathe or using her albuterol inhaler more than a couple of times a week.

## 2011-03-08 NOTE — Assessment & Plan Note (Signed)
Hemoglobin A1c is 6.9 today. I've encouraged patient to continue with her weight loss and continued regular activity.

## 2011-03-08 NOTE — Progress Notes (Signed)
  Subjective:    Patient ID: Nicole Baxter, female    DOB: 1941-02-01, 70 y.o.   MRN: [id number]  HPI Nicole Baxter comes in for six-month followup. She is doing fairly well. She's been having a little bit more trouble with her breathing since the pollen count is up. She is using her Advair regularly and her albuterol up to 2-3 times a week. She is not awakening at night with shortness of breath. She did have to stop to get her breath walking into the building from the parking lot today.  She has lost 6 pounds since last time she was in and has just been watching what she appeared that she does not get regular aerobic exercise she is active all the time.  When she was last in she was most worried about a tremor which seems to be bothering her much less. Also her left arm is bothering her and she could not raise her left arm up but now she can and that is much better.  Patient is fasting today and here for lipid and liver panel on the statin. Her blood pressure is well-controlled today. She is taking her medications without problem.  Reviewing all of her past history when I was letting her information and affect I did see that she worked for [employer] for 31 years and that has helped she got her lung disease.    Review of Systems  Constitutional: Negative for fatigue.  Respiratory: Negative for shortness of breath.   Cardiovascular: Negative for chest pain.  Gastrointestinal: Negative for nausea, vomiting and abdominal pain.       Objective:   Physical Exam  Constitutional: She appears well-developed. No distress.  HENT:  Head: Normocephalic and atraumatic.  Cardiovascular: Normal rate, regular rhythm and normal heart sounds.   No murmur heard. Pulmonary/Chest: Breath sounds normal.  Abdominal: Soft. Bowel sounds are normal. There is no tenderness.  Musculoskeletal: Normal range of motion. She exhibits no edema.  Skin: Skin is warm and dry.  Psychiatric: She has a normal mood and affect.            Assessment & Plan:

## 2011-03-08 NOTE — Patient Instructions (Signed)
Have a great summer! Keep up the great work!!! We will let you know when you labs return.

## 2011-03-08 NOTE — Assessment & Plan Note (Signed)
Nicole Baxter is doing fine on her statin. She is fasting today we'll check fasting lipids and Cmet.

## 2011-03-08 NOTE — Assessment & Plan Note (Signed)
Other this tremor is still present it is bothering her much less and I still think it is completely benign.

## 2011-03-08 NOTE — Assessment & Plan Note (Signed)
Patient has lost 6 pounds and she was last and is encouraged to continue with this.

## 2011-03-21 ENCOUNTER — Encounter: Payer: Self-pay | Admitting: *Deleted

## 2011-04-15 NOTE — H&P (Signed)
   NAME:  Nicole Baxter, Nicole Baxter                            ACCOUNT NO.:  [id number]   MEDICAL RECORD NO.:  [id number]                   PATIENT TYPE:  EMS   LOCATION:  ED                                   FACILITY:  [facility]   PHYSICIAN:  Dionne Ano. Everlene Other, M.D.         DATE OF BIRTH:  Feb 16, 1941   DATE OF ADMISSION:  08/11/2003  DATE OF DISCHARGE:                                HISTORY & PHYSICAL   REASON FOR ADMISSION:  Right middle finger laceration and ring finger open  fracture.   HISTORY OF PRESENT ILLNESS:  The patient is a 70 year old right hand  dominant female who presents after an on the job injury at 8:25 a.m. at [facility] [employer].  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  Her tetanus is up to date; it was  last performed two years ago.  The patient noticed no other injury.  The  patient is here with her work Engineer, civil (consulting), Gevena Cotton.   PAST MEDICAL HISTORY:  1. Hypertension.   PAST SURGICAL HISTORY:  None.   ALLERGIES:  None.   MEDICATIONS:  1. Hydrochlorothiazide.   SOCIAL HISTORY:  She is married with five children.  She does not smoke or  drink.  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   PHYSICAL EXAMINATION:  GENERAL:  She is a very pleasant female, alert and  oriented and in no acute distress.  NECK AND BACK:  Non-tender.  HEENT EXAM:  Within normal limits.  EXTREMITIES:  Her bilateral lower extremity examination is atraumatic.  The  patient's right upper extremity has fractures to the ring finger with an  open nailbed injury.  This is an open fracture with partial nailbed  avulsion.  There is a nail plate laceration which is obvious.  She has an  associated laceration of the middle finger, dorsal aspect.   X-rays are reviewed which show the fracture of the ring finger, comminuted  about the distal phalanx.    IMPRESSION:  1.  Open distal phalanx fracture, right ring finger with nailbed injury.  2. Right middle finger laceration without obvious fracture.   PLAN:  I verbally consented and found her to need repair of these structures  as necessary.                                               Dionne Ano. Everlene Other, M.D.    Nash Mantis  D:  08/11/2003  T:  08/11/2003  Job:  [id number]

## 2011-04-15 NOTE — Op Note (Signed)
NAME:  Nicole Baxter, Nicole Baxter                            ACCOUNT NO.:  [id number]   MEDICAL RECORD NO.:  [id number]                   PATIENT TYPE:  EMS   LOCATION:  ED                                   FACILITY:  [facility]   PHYSICIAN:  Dionne Ano. Everlene Other, M.D.         DATE OF BIRTH:  02/10/1941   DATE OF PROCEDURE:  DATE OF DISCHARGE:                                 OPERATIVE REPORT   SURGEON:  Dionne Ano. Gramig, M.D.   DESCRIPTION OF PROCEDURE:  The patient was brought to the procedure where  she was given a lidocaine and Marcaine block.  Following this she underwent  prep and drape with Betadine scrub and paint.  Once this was done the middle  finger was addressed with I&D of the skin and subcutaneous tissue and local  treatment was given.  Following this the patient then underwent I&D of the  right ring finger.  She underwent debridement of a fair amount of skin and  subcutaneous tissue, bone tissue that was non-viable and other non-viable  tissue.  She tolerated the debridement well.  Following this the patient  then had copious amounts of irrigation applied.  I performed nail plate  removal of the proximal aspect and following this recreated the normal  anatomy with repair of the nail bed with 6-0 chromic.  The open fracture was  treated with an open reduction technique and compression technique.  Following nail bed repair with 6-0 chromic under 4.0 loupe magnification,  the patient then had the lateral nail fold ulnarly sutured with 4-0 chromic.  She tolerated this well without difficulty.  Adaptic was placed under the  epidermiculae folds.  The tourniquet was deflated revealing excellent  refill.  I have discussed with her all issues. We placed her in a sterile  bandage with finger splint.  She will return to see [name] in therapy in one  week for splint fabrication and PIP range of motion and see [name] in the office  in two weeks for x-ray.  I have discussed her do's and don'ts, etc.  She  was  discharged home on Keflex, Vicodin and Robaxin.  She will return to see [name]  as noted above.  She has high propensity towards ridging in her nail and  deformity.  Our goal is for fracture stabilization to occur.  We will  hopefully see a nail in four months.  She tolerated the procedure well which  included I&D of the open fracture, nail plate removal, nail bed repair and  open treatment of the distal phalanx fracture.  It has been a pleasure to  see Nicole Baxter and all questions have been encouraged and answered.  Dionne Ano. Everlene Other, M.D.    Nash Mantis  D:  08/11/2003  T:  08/11/2003  Job:  [id number]

## 2011-06-14 ENCOUNTER — Encounter: Payer: Self-pay | Admitting: Internal Medicine

## 2011-06-14 ENCOUNTER — Ambulatory Visit (INDEPENDENT_AMBULATORY_CARE_PROVIDER_SITE_OTHER): Payer: PRIVATE HEALTH INSURANCE | Admitting: Internal Medicine

## 2011-06-14 VITALS — BP 127/85 | HR 85 | Temp 97.2°F | Ht 61.0 in | Wt 188.8 lb

## 2011-06-14 DIAGNOSIS — E119 Type 2 diabetes mellitus without complications: Secondary | ICD-10-CM

## 2011-06-14 DIAGNOSIS — E785 Hyperlipidemia, unspecified: Secondary | ICD-10-CM

## 2011-06-14 DIAGNOSIS — K219 Gastro-esophageal reflux disease without esophagitis: Secondary | ICD-10-CM

## 2011-06-14 DIAGNOSIS — E669 Obesity, unspecified: Secondary | ICD-10-CM

## 2011-06-14 DIAGNOSIS — I10 Essential (primary) hypertension: Secondary | ICD-10-CM

## 2011-06-14 LAB — GLUCOSE, CAPILLARY: Glucose-Capillary: 118 mg/dL — ABNORMAL HIGH (ref 70–99)

## 2011-06-14 MED ORDER — ZOSTER VACCINE LIVE 19400 UNT/0.65ML ~~LOC~~ SOLR: 0.6500 mL | Freq: Once | SUBCUTANEOUS | Status: DC

## 2011-06-14 NOTE — Assessment & Plan Note (Signed)
Stable on medication. She does not want to come off of it.

## 2011-06-14 NOTE — Assessment & Plan Note (Signed)
Excellent control on current medications. 

## 2011-06-14 NOTE — Assessment & Plan Note (Signed)
At goal on statin. Liver and lipids checked 3 months ago.

## 2011-06-14 NOTE — Patient Instructions (Signed)
I will see you back in 3 months. Keep up the good work.

## 2011-06-14 NOTE — Assessment & Plan Note (Signed)
BMI has decreased from 38 to 35 in the last 2 visits! She is encouraged to continue what she is doing.

## 2011-06-14 NOTE — Assessment & Plan Note (Signed)
Nicole Baxter is doing well. She is watching what she eats. She is again encouraged to exercise.

## 2011-06-14 NOTE — Progress Notes (Signed)
  Subjective:    Patient ID: Nicole Baxter, female    DOB: 20-Mar-1941, 70 y.o.   MRN: [id number]  HPI Nicole Baxter comes in for 3 month follow up. Her weight is down another 6 pounds this visit, as it was last visit! She hasn't been exercising but has been eating less.  Her diabetes is diet controlled now. HbA1c is 6.4 today.  Her breathing has been bad on very hot days. She takes the Advair regularly and the albuterol prn. She is not waking at night with problems breathing.  Her tremor has been stable. She takes benadryl at night sometimes to sleep so it doesn't bother her.  She does want a zostavax and her insurance does cover it.. She needs an eye exam and we will schedule it she says she had a colonoscopy 1-2 yrs ago at [facility].   Review of Systems  Constitutional: Negative for fatigue.  Respiratory: Negative for shortness of breath.   Cardiovascular: Negative for chest pain.  Gastrointestinal: Negative for nausea, vomiting and abdominal pain.       Objective:   Physical Exam  Constitutional: She appears well-developed. No distress.  HENT:  Head: Normocephalic and atraumatic.  Cardiovascular: Normal rate, regular rhythm and normal heart sounds.   No murmur heard. Pulmonary/Chest: Breath sounds normal.  Abdominal: Soft. Bowel sounds are normal. There is no tenderness.  Musculoskeletal: Normal range of motion. She exhibits no edema.  Skin: Skin is warm and dry.  Psychiatric: She has a normal mood and affect.          Assessment & Plan:

## 2011-09-20 ENCOUNTER — Encounter: Payer: Self-pay | Admitting: Internal Medicine

## 2011-09-20 ENCOUNTER — Ambulatory Visit (INDEPENDENT_AMBULATORY_CARE_PROVIDER_SITE_OTHER): Payer: Medicare Other | Admitting: Internal Medicine

## 2011-09-20 DIAGNOSIS — J45909 Unspecified asthma, uncomplicated: Secondary | ICD-10-CM

## 2011-09-20 DIAGNOSIS — Z23 Encounter for immunization: Secondary | ICD-10-CM

## 2011-09-20 DIAGNOSIS — G250 Essential tremor: Secondary | ICD-10-CM

## 2011-09-20 DIAGNOSIS — I10 Essential (primary) hypertension: Secondary | ICD-10-CM

## 2011-09-20 DIAGNOSIS — G252 Other specified forms of tremor: Secondary | ICD-10-CM

## 2011-09-20 DIAGNOSIS — E119 Type 2 diabetes mellitus without complications: Secondary | ICD-10-CM

## 2011-09-20 DIAGNOSIS — E785 Hyperlipidemia, unspecified: Secondary | ICD-10-CM

## 2011-09-20 DIAGNOSIS — J64 Unspecified pneumoconiosis: Secondary | ICD-10-CM

## 2011-09-20 LAB — BASIC METABOLIC PANEL
BUN: 10 mg/dL (ref 6–23)
Calcium: 9.6 mg/dL (ref 8.4–10.5)
Chloride: 103 mEq/L (ref 96–112)
Creat: 0.85 mg/dL (ref 0.50–1.10)

## 2011-09-20 MED ORDER — PREDNISONE 20 MG PO TABS: 20.0000 mg | ORAL_TABLET | Freq: Every day | ORAL | Status: AC

## 2011-09-20 MED ORDER — ALBUTEROL SULFATE (2.5 MG/3ML) 0.083% IN NEBU
2.5000 mg | INHALATION_SOLUTION | Freq: Once | RESPIRATORY_TRACT | Status: AC
  Administered 2011-09-20: 2.5 mg via RESPIRATORY_TRACT

## 2011-09-20 NOTE — Assessment & Plan Note (Signed)
Lipids were excellent 6 months ago. Continue current regimen.

## 2011-09-20 NOTE — Assessment & Plan Note (Signed)
Blood pressure was up when patient first came in. I believe this was because she had just gotten some upsetting news about a dear friend who had just passed away upstairs. On recheck it was excellent. Will continue current regimen.

## 2011-09-20 NOTE — Patient Instructions (Signed)
I will see you back in 6 weeks. I am sorry for your loss. Please take the prednisone as prescribed. It will take about 24 hours to start working.  You can use the albuterol as often as you need it until the prednisone starts working. If you need another breathing treatment please call us and we can do it.

## 2011-09-20 NOTE — Assessment & Plan Note (Signed)
I have told patient it is of course fine

## 2011-09-20 NOTE — Assessment & Plan Note (Signed)
HbA1c 6.4 today. I have told her sugars will go up a little on prednisone but I am only giving her an 8 day course.

## 2011-09-20 NOTE — Progress Notes (Signed)
  Subjective:    Patient ID: Nicole Baxter, female    DOB: Oct 20, 1941, 70 y.o.   MRN: [id number]  HPI70 yr old who comes in for follow up as well as c/o SOB and wheezing.  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 She complains of 2 weeks of increasing SOB with exertion and wheezing. She has not been waking at night. She has been using her albuterol more frequently, and it "ran out" yesterday. She has been using her Advair regularly BID.   Though she saw a neurologist here who diagnosed her with a familial intention tremor, her husband's nephrologist at [facility] has noticed the tremor and referred her for a second opinion to Dr. Janeal Holmes at [facility]. He is concerned about possible Parkinsons disease though patient does not have other symptoms of disease that I have noticed.  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     Review of Systems  Constitutional: Negative for fever, chills and diaphoresis.  Respiratory: Positive for shortness of breath and wheezing.   Cardiovascular: Negative for chest pain.  Gastrointestinal: Negative for abdominal pain and abdominal distention.  Musculoskeletal: Positive for arthralgias.       Objective:   Physical Exam  Constitutional: She appears well-developed and well-nourished.  HENT:  Head: Normocephalic and atraumatic.  Neck: No JVD present.  Cardiovascular: Normal rate and regular rhythm.   Pulmonary/Chest: Effort normal. She has wheezes.  Abdominal: Bowel sounds are normal. There is no tenderness.  Musculoskeletal: She exhibits no edema.  patient also has rhonchi in lung fields. Wheezes and rhonchi are markedly improved after nebulizer treatment        Assessment & Plan:

## 2011-09-20 NOTE — Assessment & Plan Note (Signed)
Nicole Baxter comes in with a 2 week history of increasing shortness of breath and wheezing. The nebulizer treatment helped her greatly. We discussed her getting one at home but she wanted to wait at this point as she seldom needs it. At this point I will give her 8 day prednisone taper and she will refill her albuterol today to use as often as needed until steroids kick in. We have talked to the pharmacy today about refilling the least expensive inhaler. Her peak flow is 350 today, pre treatment, which is encouraging. We have taught her how to use a peak flow at home. I do not see any evidence of infection. She is to call if she needs another breathing treatment. I will see her back in 6 weeks or sooner if needed.

## 2011-11-03 ENCOUNTER — Encounter: Payer: Self-pay | Admitting: Internal Medicine

## 2011-11-03 ENCOUNTER — Ambulatory Visit (INDEPENDENT_AMBULATORY_CARE_PROVIDER_SITE_OTHER): Payer: Medicare Other | Admitting: Internal Medicine

## 2011-11-03 VITALS — BP 140/84 | HR 89 | Temp 97.1°F | Ht 61.5 in | Wt 190.6 lb

## 2011-11-03 DIAGNOSIS — I10 Essential (primary) hypertension: Secondary | ICD-10-CM

## 2011-11-03 DIAGNOSIS — G252 Other specified forms of tremor: Secondary | ICD-10-CM

## 2011-11-03 DIAGNOSIS — J64 Unspecified pneumoconiosis: Secondary | ICD-10-CM

## 2011-11-03 DIAGNOSIS — E669 Obesity, unspecified: Secondary | ICD-10-CM

## 2011-11-03 DIAGNOSIS — G250 Essential tremor: Secondary | ICD-10-CM

## 2011-11-03 NOTE — Patient Instructions (Signed)
Please follow up in 3 months with Dr. Dorise Hiss. Take care and I wish you all of the best.

## 2011-11-03 NOTE — Assessment & Plan Note (Signed)
Patient was recently seen by a neurologist at [facility] regarding possible diagnosis of early Parkinson's disease. At this point she only meets one of the criteria and she does not plan to go back for any more evaluation because "it is too expensive."  When the tremor bothers her she takes benadryl, which I believe the first neurologist she saw suggested. I suggested benadryl elixir today as she can take 6.25-12.5 mg and it will be less sedating. She otherwise has no neurologic findings of Parkinson's.

## 2011-11-03 NOTE — Progress Notes (Signed)
  Subjective:    Patient ID: Nicole Baxter, female    DOB: 09/19/1941, 70 y.o.   MRN: [id number]  HPI 70 year old patient who comes in for 6 week followup of asthmatic bronchitic flare. Patient is feeling much better. She did take the steroid taper and now is back on inhalers feeling back to her baseline.  Patient did go to see the neurologist at [facility] who thinks that she may have early Parkinson's disease there she only has one of the 5 criteria of Parkinson's and they did not make that diagnosis. At this point she is just using Benadryl for when the tremor is bothering her. I did suggest that elixir as sometimes it is too sedating for her to take the whole 25 mg capsule.   Review of Systems  Constitutional: Negative for fatigue.  Respiratory: Negative for shortness of breath.   Cardiovascular: Negative for chest pain.  Gastrointestinal: Negative for nausea, vomiting and abdominal pain.       Objective:   Physical Exam  Constitutional: She appears well-developed. No distress.  HENT:  Head: Normocephalic and atraumatic.  Cardiovascular: Normal rate, regular rhythm and normal heart sounds.   No murmur heard. Pulmonary/Chest: Breath sounds normal.  Abdominal: Soft. Bowel sounds are normal. There is no tenderness.  Musculoskeletal: Normal range of motion. She exhibits no edema.  Skin: Skin is warm and dry.  Psychiatric: She has a normal mood and affect.          Assessment & Plan:

## 2011-11-03 NOTE — Assessment & Plan Note (Signed)
Nicole Baxter completed the steroid taper and is now back on Advair and not needing any Albuterol currently. I have again cautioned her to come in in the future soon if she begins to develop respiratory symptoms.

## 2011-11-03 NOTE — Assessment & Plan Note (Signed)
Nicole Baxter has lost about 13 pounds in the last year and kept it off. She is encouraged to continue.

## 2011-11-03 NOTE — Assessment & Plan Note (Signed)
Blood pressure is excellent today and has been. Continue present medications.

## 2011-11-16 ENCOUNTER — Other Ambulatory Visit: Payer: Self-pay | Admitting: Internal Medicine

## 2011-12-02 ENCOUNTER — Telehealth: Payer: Self-pay | Admitting: Dietician

## 2011-12-08 NOTE — Telephone Encounter (Signed)
Called patient to follow up on overdue health maintenance- eye care and foot exam. Left message.

## 2011-12-09 NOTE — Telephone Encounter (Signed)
Mailed patient a letter reminding her of eye and foot exams are due.

## 2012-01-06 ENCOUNTER — Ambulatory Visit: Payer: Medicare Other | Admitting: Internal Medicine

## 2012-01-10 ENCOUNTER — Ambulatory Visit (INDEPENDENT_AMBULATORY_CARE_PROVIDER_SITE_OTHER): Payer: Medicare Other | Admitting: Internal Medicine

## 2012-01-10 ENCOUNTER — Encounter: Payer: Self-pay | Admitting: Internal Medicine

## 2012-01-10 VITALS — BP 169/96 | HR 87 | Temp 97.5°F | Ht 61.0 in | Wt 191.6 lb

## 2012-01-10 DIAGNOSIS — J069 Acute upper respiratory infection, unspecified: Secondary | ICD-10-CM | POA: Insufficient documentation

## 2012-01-10 DIAGNOSIS — E119 Type 2 diabetes mellitus without complications: Secondary | ICD-10-CM

## 2012-01-10 DIAGNOSIS — Z79899 Other long term (current) drug therapy: Secondary | ICD-10-CM

## 2012-01-10 DIAGNOSIS — I10 Essential (primary) hypertension: Secondary | ICD-10-CM

## 2012-01-10 LAB — POCT GLYCOSYLATED HEMOGLOBIN (HGB A1C): Hemoglobin A1C: 6.2

## 2012-01-10 LAB — GLUCOSE, CAPILLARY: Glucose-Capillary: 117 mg/dL — ABNORMAL HIGH (ref 70–99)

## 2012-01-10 NOTE — Assessment & Plan Note (Signed)
Labs: Lab Results  Component Value Date   HGBA1C 6.2 01/10/2012   HGBA1C 7.3 09/28/2010   CREATININE 0.85 09/20/2011   CREATININE 0.83 07/13/2010   CHOL 173 03/08/2011   HDL 67 03/08/2011   TRIG 75 03/08/2011    Last eye exam and foot exam: No results found for this basename: HMDIABEYEEXA, HMDIABFOOTEX     Assessment:     Status:  Working on lifestyle modification, successfully  Disease Control: controlled  Progress toward goals: improved  Barriers to meeting goals: no barriers identified   Plan: Glucometer log was not reviewed today, as pt did not have glucometer available for review.      Continue great work towards healthy diet/ exercise/ weight loss  Foot exam performed today  Will request last eye exam records, as pt indicates she has had an eye exam within last 1 month.

## 2012-01-10 NOTE — Patient Instructions (Signed)
   Please follow-up at the clinic in 3 months with your PCP, at which time we will reevaluate your diabetes, blood pressure.  If your symptoms are not improving or are getting worse over the next 3-4 weeks, please come back in for reevaluation.  There have not been changes in your medications, please look at your complete medication list for details.   Please follow-up in the clinic sooner if needed.  If you have been started on new medication(s), and you develop symptoms concerning for allergic reaction, including, but not limited to, throat closing, tongue swelling, rash, please stop the medication immediately and call the clinic at [phone], and go to the ER.  If you are diabetic, please bring your meter to your next visit.  If symptoms worsen, or new symptoms arise, please call the clinic or go to the ER.  Please bring all of your medications in a bag to your next visit.

## 2012-01-10 NOTE — Assessment & Plan Note (Signed)
BP Readings from Last 3 Encounters:  01/10/12 169/96  11/03/11 140/84  09/20/11 126/72    Basic Metabolic Panel:    Component Value Date/Time   NA 140 09/20/2011 1116   K 3.6 09/20/2011 1116   CL 103 09/20/2011 1116   CO2 22 09/20/2011 1116   BUN 10 09/20/2011 1116   CREATININE 0.85 09/20/2011 1116   CREATININE 0.83 07/13/2010 2100   GLUCOSE 124* 09/20/2011 1116   CALCIUM 9.6 09/20/2011 1116    Assessment: Status:  Elevated acutely, however, has been regularly taking her Mucinex DM and has not been taking her blood pressure medications for several days due to feeling ill and did not want to have interactions.  Disease Control: not controlled  Progress toward goals: deteriorated  Barriers to meeting goals: not taking meds for past few days because worried about interaction between cough med and bp meds   Plan:  continue current medications  Educated to resume all meds.

## 2012-01-10 NOTE — Progress Notes (Signed)
Subjective:   Patient ID: Nicole Baxter female   DOB: [birth year] 71 y.o.   MRN: [id number]  HPI: Nicole Baxter is a 71 y.o. with a PMHx of HTN, asthma, HLD, who presented to clinic today for the following:  1) Productive cough - Pt describes a progressively improving cough that is productive of yellow and green, thick sputum, and has been ongoing x  1.5 weeks. admits to associated mild sore throat and chest tightness after coughing fits. Denies fevers, chills, shortness of breath - has been using her Albuterol as per her baseline, up to once a day. Denies ear pain or discharge, no frequent headaches or fullness/ tenderness of face, itchy or watery eyes. Aggravating factors include: cold air. has not had exposure to sick contact with similar symptoms. has not recent exposure to known allergens such as new plants, animals.  Pt is on an ACE-I or ARB. is a Never smoker. does have a history of asthma.  does have a history of acid reflux. does experience seasonal allergies.   2) HTN - Patient does not check blood pressure regularly at home. Currently taking Lisinopril 10mg  and HCTZ 25mg  daily - has not taken for several days to avoid interaction between Mucinex DM and her meds. denies headaches, dizziness, lightheadedness, chest pain, shortness of breath.  does not request refills today.  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 Currently, not on medications, and therefore, is not checking her blood sugars at home. She is diet controlled of her diabetes andis working on lifestyle modification successfully.  denies polyuria, polydipsia, nausea, vomiting, diarrhea.    Review of Systems: Per HPI.  Current Outpatient Medications: Medication Sig  . ADVAIR DISKUS 500-50 MCG/DOSE AEPB INHALE 1 PUFF BY MOUTH TWICE DAILY  . albuterol (PROAIR HFA) 108 (90 BASE) MCG/ACT inhaler Inhale 2 puffs into the lungs. Every 4-6 hours as needed for shortness of breath.   .  dextromethorphan-guaiFENesin (MUCINEX DM) 30-600 MG per 12 hr tablet Take 1 tablet by mouth every 12 (twelve) hours.  . hydrochlorothiazide 25 MG tablet Take 25 mg by mouth daily.    Marland Kitchen lisinopril (PRINIVIL,ZESTRIL) 10 MG tablet Take 10 mg by mouth daily.    Marland Kitchen omeprazole (PRILOSEC) 40 MG capsule Take 40 mg by mouth daily.    . pravastatin (PRAVACHOL) 80 MG tablet TAKE ONE TABLET BY MOUTH DAILY    Allergies: No Known Allergies  Past Medical History  Diagnosis Date  . Obesity   . Hyperglycemia     isolated FBS 122, 95, 104 (Random 138)  . 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 GERD (gastroesophageal reflux disease)     controlled on prilosec  . Hyperlipidemia     on pravastatin  . Hypertension     controlled on 2 agents    No past surgical history on file.   Objective:   Physical Exam: Filed Vitals:   01/10/12 0851  BP: 169/96  Pulse: 87  Temp: 97.5 F (36.4 C)     General: Vital signs reviewed and noted. Well-developed, well-nourished, in no acute distress; alert, appropriate and cooperative throughout examination.  Head: Normocephalic, atraumatic.  Eyes: conjunctivae/corneas clear. PERRL.  Ears: TM nonerythematous, not bulging, good light reflex bilaterally.  Nose: Mucous membranes moist, mildly inflammed, nonerythematous.  Throat: Oropharynx nonerythematous, no exudate appreciated.   Neck: No deformities, masses, or tenderness noted.  Lungs:  Normal respiratory effort. Very  occasional wheezes, bibasilar crackles.  Heart: RRR. S1 and S2 normal without gallop, murmur, or rubs.  Abdomen:  BS normoactive. Soft, Nondistended, non-tender.  No masses or organomegaly.  Extremities: No pretibial edema.  Neurologic: A&O X3, CN II - XII are grossly intact.       Assessment & Plan:  Case and plan of care discussed with Dr. Margarito Liner.

## 2012-01-10 NOTE — Assessment & Plan Note (Signed)
Patient symptoms are progressively improving during a 1.5 week time course that she's had this productive cough. At this point, she remains without fevers, chills, shortness of breath beyond her baseline, or worsening of symptoms. Despite the bibasilar crackles that I can hear on exam, clinically, the patient seems to be improving.  Provided reassurance that sometimes post bronchitis or URI, cough symptoms can persist. However, we want to a certain that day are improving and moving in the right direction.  No antibiotics or steroids today.  Counseled that if she develops fevers, chills, worsening shortness of breath or cough doesn't continue to improve and resolve in the next several weeks, she should come back for reevaluation.  There is the off chance that her cough could be due to ACE inhibitor usage (just given that she stopped her blood pressure medications during the same time course that she has been having this cough and using cough suppressant), therefore, if cough takes on chronic nature, can consider changing to an ARB.

## 2012-01-12 ENCOUNTER — Telehealth: Payer: Self-pay | Admitting: Dietician

## 2012-01-12 NOTE — Telephone Encounter (Signed)
Dr. Harvie Bridge is not in the office today. Office will find out diagnosis regarding if diabetes is affecting this patient's eyes and call us back tomorrow with this information.

## 2012-01-13 NOTE — Telephone Encounter (Signed)
Voicemail received from [place] at [facility] center [phone]. Per doctor  No diabetes mentioned by patient or found by doctor, Nothing diabetes related, no retinopathy Sent to [facility] for macula and retina diagnosis.

## 2012-01-13 NOTE — Telephone Encounter (Signed)
Sounds good, thank you.  Johnette Abraham, D.O.

## 2012-01-20 ENCOUNTER — Other Ambulatory Visit: Payer: Self-pay | Admitting: *Deleted

## 2012-01-20 MED ORDER — ALBUTEROL SULFATE HFA 108 (90 BASE) MCG/ACT IN AERS: 2.0000 | INHALATION_SPRAY | RESPIRATORY_TRACT | Status: DC | PRN

## 2012-01-20 MED ORDER — HYDROCHLOROTHIAZIDE 25 MG PO TABS: 25.0000 mg | ORAL_TABLET | Freq: Every day | ORAL | Status: DC

## 2012-01-20 MED ORDER — LISINOPRIL 10 MG PO TABS: 10.0000 mg | ORAL_TABLET | Freq: Every day | ORAL | Status: DC

## 2012-01-20 MED ORDER — PRAVASTATIN SODIUM 80 MG PO TABS: 80.0000 mg | ORAL_TABLET | Freq: Every day | ORAL | Status: DC

## 2012-01-20 MED ORDER — FLUTICASONE-SALMETEROL 500-50 MCG/DOSE IN AEPB: 1.0000 | INHALATION_SPRAY | Freq: Two times a day (BID) | RESPIRATORY_TRACT | Status: DC

## 2012-01-20 NOTE — Telephone Encounter (Signed)
Pt states she is changing to a new pharmacy/mail-order; need new rx's electronic sent 90 day supply.

## 2012-02-03 ENCOUNTER — Ambulatory Visit (INDEPENDENT_AMBULATORY_CARE_PROVIDER_SITE_OTHER): Payer: Medicare Other | Admitting: Internal Medicine

## 2012-02-03 ENCOUNTER — Encounter: Payer: Self-pay | Admitting: Internal Medicine

## 2012-02-03 DIAGNOSIS — I10 Essential (primary) hypertension: Secondary | ICD-10-CM

## 2012-02-03 DIAGNOSIS — J45909 Unspecified asthma, uncomplicated: Secondary | ICD-10-CM

## 2012-02-03 DIAGNOSIS — G252 Other specified forms of tremor: Secondary | ICD-10-CM

## 2012-02-03 DIAGNOSIS — G250 Essential tremor: Secondary | ICD-10-CM

## 2012-02-03 DIAGNOSIS — E669 Obesity, unspecified: Secondary | ICD-10-CM

## 2012-02-03 DIAGNOSIS — K219 Gastro-esophageal reflux disease without esophagitis: Secondary | ICD-10-CM

## 2012-02-03 DIAGNOSIS — E785 Hyperlipidemia, unspecified: Secondary | ICD-10-CM

## 2012-02-03 DIAGNOSIS — E119 Type 2 diabetes mellitus without complications: Secondary | ICD-10-CM

## 2012-02-03 NOTE — Assessment & Plan Note (Signed)
She states that this is very well controlled. It is related specifically to tomato intake and she limits tomato sauce intake. Occasional use of Prilosec.

## 2012-02-03 NOTE — Assessment & Plan Note (Signed)
She does have occasional tremor of the upper sternum it is. She does not have any cause to take medication at this time as she is not bothered by the symptoms. I've informed her that if she is bothered by them in the future that we may be able to treat them with medication.

## 2012-02-03 NOTE — Assessment & Plan Note (Signed)
She is currently on diet control only. No medications. Glucose at today's visit was 113. She is continuing to lose weight and I did encourage her in this regard.

## 2012-02-03 NOTE — Patient Instructions (Signed)
You were seen today to meet your new doctor, Dr. Dorise Hiss. You can use a heating pack on your shoulder as you need it. Please take ibuprofen (advil, motrin) over the counter three times per day. Take 600 mg (usually 3 of the 200 mg tablets) each time. Take this for 1 week to calm down the inflammation in your knee. If you are still having problems please feel free to call our clinic. We will see you back in 3 months for a check up. If you are feeling sick and need to be seen sooner please call us. Our number is [phone].

## 2012-02-03 NOTE — Assessment & Plan Note (Signed)
Patient is continuing to lose weight. She has lost 2 pounds since last visit. I did congratulate him encourage this weight loss. Did also encourage regular exercise.

## 2012-02-03 NOTE — Assessment & Plan Note (Signed)
She continues to take her statin and is working on losing weight.

## 2012-02-03 NOTE — Assessment & Plan Note (Signed)
The patient is well-controlled using Advair daily, she occasionally uses albuterol when walking long distances. Not having shortness of breath currently and not in acute exacerbation. No change to regimen at today's visit.

## 2012-02-03 NOTE — Progress Notes (Signed)
Subjective:     Patient ID: Nicole Baxter, female   DOB: [birth year], 71 y.o.   MRN: [id number]  HPI The patient is a very pleasant 71 year old female who is a new patient to this provider. She was previously a patient of Dr. Coralee Pesa. She does have a history of hypertension, hyperlipidemia, obesity, diet-controlled diabetes mellitus. 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 She is currently retired and lives with her husband. She did have an upper respiratory infection in February that has cleared up. She is no longer needing cough syrup. She's not having flare of her asthma. She denies shortness of breath, chest pain, abdominal pain. She is having knee pain and shoulder pain at today's visit. Her left knee hurts and she says that her arthritis is acting up. It has been for the last several days. Her shoulder is hurting, and she states that she pulled a muscle while doing some exercise. She has not tried anything for this pain and she says that she did use a heating pad which helped a little bit. She has no other complaints at today's visit and does not require any refills on any of her medications. She has lost 2 pounds since last visit and she is very proud of herself. I did encourage and congratulate this weight loss. She is not a smoker and has never been.  Review of Systems  Constitutional: Negative for fever, chills, activity change, appetite change, fatigue and unexpected weight change.  Eyes: Negative.   Respiratory: Negative for cough, choking, chest tightness, shortness of breath and wheezing.   Cardiovascular: Negative for chest pain, palpitations and leg swelling.  Gastrointestinal: Negative for nausea, vomiting, abdominal pain, diarrhea, constipation, blood in stool and abdominal distention.  Musculoskeletal: Positive for myalgias and arthralgias. Negative for back pain, joint swelling and gait problem.  Skin: Negative for color change, pallor, rash  and wound.  Neurological: Positive for headaches. Negative for dizziness, tremors, seizures, syncope, facial asymmetry, speech difficulty, weakness, light-headedness and numbness.       She does have headache associated with hot flashes that resolves spontaneously without medication or intervention.  Hematological: Negative.   Psychiatric/Behavioral: Negative.     Vitals: But pressure: 130/82 Pulse: 76 Temperature: 97.54F Oxygen saturation on room air: 98% Blood glucose: 113 Weight: 190 pounds    Objective:   Physical Exam  Constitutional: She is oriented to person, place, and time. She appears well-developed and well-nourished. No distress.  HENT:  Head: Normocephalic and atraumatic.  Eyes: EOM are normal. Pupils are equal, round, and reactive to light.  Neck: Normal range of motion. Neck supple. No JVD present. No tracheal deviation present. No thyromegaly present.  Cardiovascular: Normal rate, regular rhythm and normal heart sounds.   Pulmonary/Chest: Effort normal and breath sounds normal. No respiratory distress. She has no wheezes. She has no rales. She exhibits no tenderness.  Abdominal: Soft. Bowel sounds are normal. She exhibits no distension. There is no tenderness. There is no rebound and no guarding.  Musculoskeletal: Normal range of motion. She exhibits tenderness. She exhibits no edema.       Tender in the left knee with standing and shoulder is tender to palpation.   Neurological: She is alert and oriented to person, place, and time.  Skin: Skin is warm and dry. No rash noted. No erythema. No pallor.  Psychiatric: She has a normal mood and affect. Her behavior is normal. Judgment and thought content normal.  Assessment/Plan:   1. Please see problem-oriented charting.  2. Disposition-patient will be seen back in 3 months. I have informed her that she can take ibuprofen 600 mg 3 times a day for one week. If this does not alleviate her knee pain she can call us  back. At that time it may be reasonable to do a knee injection with corticosteroid. I have talked her about this procedure and she would be willing to proceed if the knee pain is prolonged. No change to any medications at today's visit. I did give her our number and let her know that she can call us back sooner she is having sickness.

## 2012-02-03 NOTE — Assessment & Plan Note (Signed)
Current medication regimen is controlling her blood pressure appropriately. Blood pressure at today's visit 130/82. No change to regimen at today's visit. We will see back in 3 months.

## 2012-03-15 ENCOUNTER — Encounter: Payer: Self-pay | Admitting: Internal Medicine

## 2012-03-15 ENCOUNTER — Ambulatory Visit (INDEPENDENT_AMBULATORY_CARE_PROVIDER_SITE_OTHER): Payer: Medicare Other | Admitting: Internal Medicine

## 2012-03-15 VITALS — BP 150/86 | HR 88 | Temp 96.9°F | Ht 61.0 in | Wt 191.2 lb

## 2012-03-15 DIAGNOSIS — M1710 Unilateral primary osteoarthritis, unspecified knee: Secondary | ICD-10-CM

## 2012-03-15 DIAGNOSIS — M25569 Pain in unspecified knee: Secondary | ICD-10-CM

## 2012-03-15 DIAGNOSIS — M170 Bilateral primary osteoarthritis of knee: Secondary | ICD-10-CM | POA: Insufficient documentation

## 2012-03-15 NOTE — Assessment & Plan Note (Addendum)
Nonsteroidal anti-inflammatory drugs generally works for her except the past one month. severe pain mostly in her left knee.  Plan - Knee joint injection into left knee done in the clinic with Kenalog and Xylocaine - Followup in one week for right knee injection if left knee pain improves with the above treatment. - Referral to orthopedic surgeon if no improvement. - Continue ibuprofen.  Procedure note  Left knee kenalog+xylocaine injection Indication- OA pain Time out- performed Anaesthasia- local lidocaine spray Approach- medial, infrapatellar, blind with sterile gloves and technique using 18G needle after cleaning the area with alcohol and betadine.  Fluid- no synovial fluid aspirated. Complication- none. Patient felt the pain getting better in 5-10 minutes.  Follow up in 1 week for another knee injection.

## 2012-03-15 NOTE — Progress Notes (Signed)
Patient ID: Nicole Baxter, female   DOB: 03-05-1941, 71 y.o.   MRN: [id number]  71 year old woman with past medical history of bilateral knee osteo arthritis diagnosed in 2008 on nonnarcotic pain medications had an osteo-arthritis flare and was asked to take ibuprofen 3 times a day. She's been taking it for past one month without any benefit in her knee pain. The pain is worse in the left knee but she also has some in the right knee. We'll worse with exertion and weightbearing. She is able to walk and function but minimally.  no history of falls or trauma. No swelling or redness of the joint. She comes in for further management of the knee joint pain.  Physical exam  General Appearance:     Filed Vitals:   03/15/12 0958  BP: 150/86  Pulse: 88  Temp: 96.9 F (36.1 C)  TempSrc: Oral  Height: 5\' 1"  (1.549 m)  Weight: 191 lb 3.2 oz (86.728 kg)  SpO2: 97%     Alert, cooperative, no distress, appears stated age  Head:    Normocephalic, without obvious abnormality, atraumatic  Eyes:    PERRL, conjunctiva/corneas clear, EOM's intact, fundi    benign, both eyes       Neck:   Supple, symmetrical, trachea midline, no adenopathy;       thyroid:  No enlargement/tenderness/nodules; no carotid   bruit or JVD  Lungs:     Clear to auscultation bilaterally, respirations unlabored  Chest wall:    No tenderness or deformity  Heart:    Regular rate and rhythm, S1 and S2 normal, no murmur, rub   or gallop  Abdomen:     Soft, non-tender, bowel sounds active all four quadrants,    no masses, no organomegaly  Extremities:   Extremities normal, atraumatic, no cyanosis or edema, full range of motion in bilateral knees with moderate pain. No swelling or redness.   Pulses:   2+ and symmetric all extremities  Skin:   Skin color, texture, turgor normal, no rashes or lesions  Neurologic:  nonfocal grossly    ROS  Constitutional: Denies fever, chills, diaphoresis, appetite change and fatigue.  Respiratory: Denies  SOB, DOE, cough, chest tightness,  and wheezing.   Cardiovascular: Denies chest pain, palpitations and leg swelling.  Gastrointestinal: Denies nausea, vomiting, abdominal pain, diarrhea, constipation, blood in stool and abdominal distention.  Skin: Denies pallor, rash and wound.  Neurological: Denies dizziness, light-headedness, numbness and headaches.

## 2012-03-22 ENCOUNTER — Ambulatory Visit (INDEPENDENT_AMBULATORY_CARE_PROVIDER_SITE_OTHER): Payer: Medicare Other | Admitting: Internal Medicine

## 2012-03-22 ENCOUNTER — Encounter: Payer: Self-pay | Admitting: Internal Medicine

## 2012-03-22 VITALS — BP 152/90 | HR 91 | Temp 96.9°F | Ht 61.5 in | Wt 193.9 lb

## 2012-03-22 DIAGNOSIS — IMO0002 Reserved for concepts with insufficient information to code with codable children: Secondary | ICD-10-CM

## 2012-03-22 DIAGNOSIS — M1710 Unilateral primary osteoarthritis, unspecified knee: Secondary | ICD-10-CM

## 2012-03-22 DIAGNOSIS — M170 Bilateral primary osteoarthritis of knee: Secondary | ICD-10-CM

## 2012-03-22 MED ORDER — MELOXICAM 7.5 MG PO TABS: 7.5000 mg | ORAL_TABLET | Freq: Every day | ORAL | Status: DC

## 2012-03-22 NOTE — Assessment & Plan Note (Signed)
Pain improved with kenalog injection in left knee Will Rx mobic and refer to PT Follow up in 1 month to ensure compliance and progress

## 2012-03-22 NOTE — Progress Notes (Signed)
Patient ID: Nicole Baxter, female   DOB: [birth year], 71 y.o.   MRN: [id number] Patient ID: Nicole Baxter, female   DOB: 12/25/1940, 72 y.o.   MRN: [id number]  71 year old woman with past medical history of bilateral knee osteo arthritis diagnosed in 2008 on nonnarcotic pain medications had an osteo-arthritis flare and was asked to take ibuprofen 3 times a day. She's been taking it for past one month without any benefit in her knee pain. The pain is worse in the left knee but she also has some in the right knee. We'll worse with exertion and weightbearing. She is able to walk and function but minimally.  no history of falls or trauma. No swelling or redness of the joint. She had kenalog injection of the left knee last week. The pain is better but still has some with wt bearing. She comes in for further management of the knee joint pain.  Physical exam  General Appearance:     Filed Vitals:   03/22/12 0821  BP: 152/90  Pulse: 91  Temp: 96.9 F (36.1 C)  TempSrc: Oral  Height: 5' 1.5" (1.562 m)  Weight: 193 lb 14.4 oz (87.952 kg)  SpO2: 100%     Alert, cooperative, no distress, appears stated age  Head:    Normocephalic, without obvious abnormality, atraumatic  Eyes:    PERRL, conjunctiva/corneas clear, EOM's intact, fundi    benign, both eyes       Neck:   Supple, symmetrical, trachea midline, no adenopathy;       thyroid:  No enlargement/tenderness/nodules; no carotid   bruit or JVD  Lungs:     Clear to auscultation bilaterally, respirations unlabored  Chest wall:    No tenderness or deformity  Heart:    Regular rate and rhythm, S1 and S2 normal, no murmur, rub   or gallop  Abdomen:     Soft, non-tender, bowel sounds active all four quadrants,    no masses, no organomegaly  Extremities:   Extremities normal, atraumatic, no cyanosis or edema, full range of motion in bilateral knees with moderate pain. No swelling or redness.   Pulses:   2+ and symmetric all extremities  Skin:   Skin color,  texture, turgor normal, no rashes or lesions  Neurologic:  nonfocal grossly    ROS  Constitutional: Denies fever, chills, diaphoresis, appetite change and fatigue.  Respiratory: Denies SOB, DOE, cough, chest tightness,  and wheezing.   Cardiovascular: Denies chest pain, palpitations and leg swelling.  Gastrointestinal: Denies nausea, vomiting, abdominal pain, diarrhea, constipation, blood in stool and abdominal distention.  Skin: Denies pallor, rash and wound.  Neurological: Denies dizziness, light-headedness, numbness and headaches.

## 2012-03-22 NOTE — Patient Instructions (Signed)
Knee Pain  Knee pain can be a result of an injury or other medical conditions. Treatment will depend on the cause of your pain.  HOME CARE   Only take medicine as told by your doctor.    Keep a healthy weight. Being overweight can make the knee hurt more.    Stretch before exercising or playing sports.    If there is constant knee pain, change the way you exercise. Ask your doctor for advice.    Make sure shoes fit well. Choose the right shoe for the sport or activity.    Protect your knees. Wear kneepads if needed.    Rest when you are tired.   GET HELP RIGHT AWAY IF:     Your knee pain does not stop.    Your knee pain does not get better.    Your knee joint feels hot to the touch.    You have a temperature by mouth above 102 F (38.9 C), not controlled by medicine.    Your baby is older than 3 months with a rectal temperature of 102 F (38.9 C) or higher.    Your baby is 3 months old or younger with a rectal temperature of 100.4 F (38 C) or higher.   MAKE SURE YOU:     Understand these instructions.    Will watch this condition.    Will get help right away if you are not doing well or get worse.   Document Released: 02/10/2009 Document Revised: 11/03/2011 Document Reviewed: 02/10/2009  [employer] Patient Information 2012 [employer]

## 2012-04-03 ENCOUNTER — Ambulatory Visit (HOSPITAL_COMMUNITY)
Admission: RE | Admit: 2012-04-03 | Discharge: 2012-04-03 | Disposition: A | Discharge status: Home / Self Care | Payer: Medicare Other | Source: Ambulatory Visit | Attending: Internal Medicine | Admitting: Internal Medicine

## 2012-04-03 DIAGNOSIS — I10 Essential (primary) hypertension: Secondary | ICD-10-CM | POA: Insufficient documentation

## 2012-04-03 DIAGNOSIS — M25569 Pain in unspecified knee: Secondary | ICD-10-CM | POA: Insufficient documentation

## 2012-04-03 DIAGNOSIS — E119 Type 2 diabetes mellitus without complications: Secondary | ICD-10-CM | POA: Insufficient documentation

## 2012-04-03 DIAGNOSIS — E785 Hyperlipidemia, unspecified: Secondary | ICD-10-CM | POA: Insufficient documentation

## 2012-04-03 DIAGNOSIS — M6281 Muscle weakness (generalized): Secondary | ICD-10-CM | POA: Insufficient documentation

## 2012-04-03 DIAGNOSIS — IMO0001 Reserved for inherently not codable concepts without codable children: Secondary | ICD-10-CM | POA: Insufficient documentation

## 2012-04-03 NOTE — Evaluation (Signed)
Physical Therapy Evaluation  Patient Details  Name: Nicole Baxter MRN: [id number] Date of Birth: 1941-07-13  Today's Date: 04/03/2012 Time: 7829-5621 PT Time Calculation (min): 46 min Charges: 1 eval Visit#: 1  of 4   Re-eval: 05/03/12 Assessment Diagnosis: Lt knee pain Next MD Visit: Dr. Scot Dock - 04/13/12 Prior Therapy: None  Past Medical History:  Past Medical History  Diagnosis Date  . Obesity   . Hyperglycemia     isolated FBS 122, 95, 104 (Random 138)  . 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 GERD (gastroesophageal reflux disease)     controlled on prilosec  . Hyperlipidemia     on pravastatin  . Hypertension     controlled on 2 agents   Past Surgical History: No past surgical history on file.  Subjective Symptoms/Limitations Symptoms: Pt is referred to PT secondary to left knee pain that has been increasing in the past few weeks.  She recently saw Dr. Scot Dock who injected her knee w/a Kenalog injetion on 4/25 and reports that it has helped to decrease her pain, denies the mobic is helping her.  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 30 minutes if asthma is not too bad.  Special Tests: - Valgus, - Varus, - Lachmans, - Ant Drawer to R knee Pain Assessment Currently in Pain?: Yes Pain Score:   4 (Pain Range 0-4/10; 4 with direct palpation to medial knee) Pain Location: Knee Pain Orientation: Medial;Right  Prior Functioning  Prior Function Driving: Yes Vocation: Retired Leisure: Hobbies-yes (Comment) Comments: She enjoys going out in the community during most of the day.   Sensation/Coordination/Flexibility/Functional Tests Coordination Gross Motor Movements are Fluid and  Coordinated: No Coordination and Movement Description: Impaired coordination with squatting, unable to perform R SLS secondary to pain. Heel and Toe walking is normal  Functional Tests Functional Tests: LEFS: 56/80 Functional Tests: 5 STS: 23.8 sec  Assessment LLE Strength Left Hip Flexion: 4/5 (Compenstaion w/trunk extension) Left Hip Extension: 3+/5 Left Hip ABduction: 3/5 Left Knee Flexion: 4/5 Left Knee Extension: 5/5 Palpation Palpation: increased pain and tenderness to R medial joint line of knee.  Fascial restrcitons throughout medial knee and quad mus.  Mobility/Balance  Ambulation/Gait Ambulation/Gait: Yes Gait Pattern: Decreased weight shift to right;Antalgic;Lateral hip instability Static Standing Balance Static Standing - Comment/# of Minutes: maximum hold for 10 sec.  Single Leg Stance - Right Leg: 10  Single Leg Stance - Left Leg: 5  Tandem Stance - Right Leg: 10  Tandem Stance - Left Leg: 10  Rhomberg - Eyes Opened: 10  Rhomberg - Eyes Closed: 10     Exercise/Treatments Standing Heel Raises: 10 reps Functional Squat: 10 reps Sidelying Hip ABduction: 10 reps Clams: 3x10 sec holds   Manual Therapy Manual Therapy: Joint mobilization Joint Mobilization: Grade I-II to proxmial tib/fib and knee to decrease pain.  Patellar mobs in each direction to decrease pain.   Physical Therapy Assessment and Plan PT Assessment and Plan Clinical Impression Statement: Pt is a 71 year old female referred to PT for R knee pain from OA.  After examiniation it was found that she has decreased pain symptoms with manual techniques and ther-ex.  Pt will benefit  from skilled therapeutic intervention in order to improve on the following deficits: Abnormal gait;Decreased coordination;Decreased mobility;Pain;Decreased strength Rehab Potential: Good PT Frequency:  (4 visits) PT Duration:  (4 visits) PT Treatment/Interventions: Gait training;Stair training;Functional mobility  training;Therapeutic activities;Therapeutic exercise;Neuromuscular re-education;Patient/family education;Other (comment) (Manual techniques and modalities for pain.) PT Plan: Manual techniques to decrease pain.  General LE and core strengthening for HEP.      Goals Home Exercise Program Pt will Perform Home Exercise Program: Independently PT Goal: Perform Home Exercise Program - Progress: Goal set today PT Short Term Goals Time to Complete Short Term Goals: Other (comment) (4 visits) PT Short Term Goal 1: Pt will report pain less than 2/10 for 75% of her day and score a 62/80 on LEFS for improved QOL PT Short Term Goal 2: Pt will demonstrate L SLS on static surface x20 sec PT Short Term Goal 3: Pt will improve functional strength and demonstrate 5 STS in 15 sec.  PT Short Term Goal 4: Pt will improve LE strength by 1 muscle grade in order to ascend and descend stairs with handrail, with reports of improved speed.   Problem List Patient Active Problem List  Diagnoses  . DIABETES MELLITUS, TYPE II  . HYPERLIPIDEMIA  . OBESITY  . TREMOR, ESSENTIAL  . HYPERTENSION  . ALLERGIC RHINITIS, SEASONAL  . OCCUPATIONAL ASTHMA  . GERD  . Asthma  . Osteoarthritis of both knees  . Knee pain    PT - End of Session Activity Tolerance: Patient tolerated treatment well PT Plan of Care PT Home Exercise Plan: see scanned report PT Patient Instructions: Educated pt husband [name]) on performing knee joint mobs at home to decrease pt's pain.   [name] 04/03/2012, 3:54 PM  Physician Documentation Your signature is required to indicate approval of the treatment plan as stated above.  Please sign and either send electronically or make a copy of this report for your files and return this physician signed original.   Please mark one 1.__approve of plan  2. ___approve of plan with the following conditions.   ______________________________                                                           _____________________ Physician Signature                                                                                                             Date

## 2012-04-05 ENCOUNTER — Ambulatory Visit (HOSPITAL_COMMUNITY)
Admission: RE | Admit: 2012-04-05 | Discharge: 2012-04-05 | Disposition: A | Discharge status: Home / Self Care | Payer: Medicare Other | Source: Ambulatory Visit | Attending: Internal Medicine | Admitting: Internal Medicine

## 2012-04-05 NOTE — Progress Notes (Signed)
Physical Therapy Treatment Patient Details  Name: Nicole Baxter MRN: [id number] Date of Birth: 1941-04-27  Today's Date: 04/05/2012 Time: 4098-1191 PT Time Calculation (min): 43 min  Visit#: 2  of 4   Re-eval: 05/03/12  Charge: NMR 18 min Manual 10 min therex 15 min   Subjective: Symptoms/Limitations Symptoms: Pt stated knee feeling better following last session, pain scale 3/10 today. Pain Assessment Currently in Pain?: Yes Pain Score:   3 Pain Location: Knee Pain Orientation: Left  Objective:   Exercise/Treatments Standing Heel Raises: 10 reps;Limitations Heel Raises Limitations: 10 toe raises  Functional Squat: 10 reps SLS: R 30", L 19" max of 3 Other Standing Knee Exercises: tandem stance 2x 30", tandem gait 1 RT; retro tandem gait 1 RT Seated Other Seated Knee Exercises: heel/toe roll in/out Supine Straight Leg Raises: 10 reps Patellar Mobs: Done R/L, S/I and tib/fib A.P Sidelying Hip ABduction: Limitations Hip ABduction Limitations: 3 reps stopped early due to glut med stinging Clams: 10x 10 sec holds with tc for proper mm  Physical Therapy Assessment and Plan PT Assessment and Plan Clinical Impression Statement: Began treatment per PT plan for L knee strengthening, pain relief and balance.  Pt with B LE in ER when standing.  Added heel/toe roll in/out to address tightness with most difficulty with IR, pt stated she would add to her HEP.  Pt reported pain reduced following manual techniques at end of session. PT Plan: Continue with current POC, manual techniques to decrease pain, general LE and core strengthening.  Give pt HEP printout for heel/toe rolls next session.    Goals    Problem List Patient Active Problem List  Diagnoses  . DIABETES MELLITUS, TYPE II  . HYPERLIPIDEMIA  . OBESITY  . TREMOR, ESSENTIAL  . HYPERTENSION  . ALLERGIC RHINITIS, SEASONAL  . OCCUPATIONAL ASTHMA  . GERD  . Asthma  . Osteoarthritis of both knees  . Knee pain    PT -  End of Session Activity Tolerance: Patient tolerated treatment well General Behavior During Session: [facility] for tasks performed Cognition: [facility] for tasks performed  GP No functional reporting required  Juel Burrow, PTA 04/05/2012, 2:23 PM

## 2012-04-11 ENCOUNTER — Ambulatory Visit (HOSPITAL_COMMUNITY)
Admission: RE | Admit: 2012-04-11 | Discharge: 2012-04-11 | Disposition: A | Discharge status: Home / Self Care | Payer: Medicare Other | Source: Ambulatory Visit | Attending: Internal Medicine | Admitting: Internal Medicine

## 2012-04-11 NOTE — Progress Notes (Signed)
Physical Therapy Treatment Patient Details  Name: Nicole Baxter MRN: [id number] Date of Birth: [birth year]  Today's Date: 04/11/2012 Time: 4098-1191 PT Time Calculation (min): 43 min  Visit#: 3  of 4   Re-eval: 05/03/12  Charge: therex 33 min Manual 10 min  Subjective: Symptoms/Limitations Symptoms: Pt stated knee sore today, pain scale 3/10 on medial portion L knee.  Objective:   Exercise/Treatments Aerobic Stationary Bike: 6'@2 .0 seat 7 Standing Heel Raises: 15 reps Heel Raises Limitations: 15 toe raises  Lateral Step Up: Left;10 reps;Hand Hold: 2;Step Height: 4" Forward Step Up: Left;10 reps;Hand Hold: 1;Step Height: 4" Functional Squat: 10 reps SLS: R 26", L 18" max of 3 Other Standing Knee Exercises: tandem stance 2x 30", tandem gait 1 RT; retro tandem gait 1 RT Seated Other Seated Knee Exercises: heel/toe roll in/out 10 x10" holds Other Seated Knee Exercises: 5 STS Supine Patellar Mobs: Done R/L, S/I and tib/fib A.P   Manual Therapy Joint Mobilization: Grade I-II to proxmial tib/fib and knee to reduce adhesions and decrease pain. Patellar mobs in each direction to decrease pain.   Physical Therapy Assessment and Plan PT Assessment and Plan Clinical Impression Statement: Progressed therex activities per PT POC with focus on L LE strenthening, balance and manual techniques for pain relief.  Began stair training for functional strengthening with vc-ing for proper form and technique.   PT Plan: Re-eval next session prior MD apt.     Goals    Problem List Patient Active Problem List  Diagnoses  . DIABETES MELLITUS, TYPE II  . HYPERLIPIDEMIA  . OBESITY  . TREMOR, ESSENTIAL  . HYPERTENSION  . ALLERGIC RHINITIS, SEASONAL  . OCCUPATIONAL ASTHMA  . GERD  . Asthma  . Osteoarthritis of both knees  . Knee pain    PT - End of Session Activity Tolerance: Patient tolerated treatment well General Behavior During Session: [facility] for tasks performed Cognition: [facility] for  tasks performed  GP No functional reporting required  Juel Burrow, PTA 04/11/2012, 1:23 PM

## 2012-04-13 ENCOUNTER — Ambulatory Visit (INDEPENDENT_AMBULATORY_CARE_PROVIDER_SITE_OTHER): Payer: Medicare Other | Admitting: Internal Medicine

## 2012-04-13 ENCOUNTER — Ambulatory Visit (HOSPITAL_COMMUNITY)
Admission: RE | Admit: 2012-04-13 | Discharge: 2012-04-13 | Disposition: A | Discharge status: Home / Self Care | Payer: Medicare Other | Source: Ambulatory Visit | Attending: Internal Medicine | Admitting: Internal Medicine

## 2012-04-13 VITALS — BP 138/82 | HR 90 | Temp 97.7°F | Ht 61.0 in | Wt 190.3 lb

## 2012-04-13 DIAGNOSIS — Z79899 Other long term (current) drug therapy: Secondary | ICD-10-CM

## 2012-04-13 DIAGNOSIS — E119 Type 2 diabetes mellitus without complications: Secondary | ICD-10-CM

## 2012-04-13 DIAGNOSIS — I10 Essential (primary) hypertension: Secondary | ICD-10-CM

## 2012-04-13 DIAGNOSIS — J45909 Unspecified asthma, uncomplicated: Secondary | ICD-10-CM

## 2012-04-13 DIAGNOSIS — M1990 Unspecified osteoarthritis, unspecified site: Secondary | ICD-10-CM

## 2012-04-13 MED ORDER — CELECOXIB 50 MG PO CAPS: 50.0000 mg | ORAL_CAPSULE | Freq: Two times a day (BID) | ORAL | Status: AC

## 2012-04-13 NOTE — Progress Notes (Signed)
Subjective:     Patient ID: Nicole Baxter, female   DOB: [birth year], 71 y.o.   MRN: [id number]  HPI The patient is a 71 year old female who has history of asthma, knee pain, GERD. Also history of diabetes. She did have her hemoglobin A1c checked at today's visit. She is controlled by diet alone. She is here for followup of knee pain which was injected at the end of April by another doctor at this clinic. She states that the injections did not help that much however physical therapy has been helping a lot and she will like to continue that. She is not having any breathing problems, chest pain, fevers, chills, cough. Her knees are not hurting her that much at today's visit.  Review of Systems  Constitutional: Negative.   HENT: Negative.   Respiratory: Negative for cough, chest tightness, shortness of breath and wheezing.   Cardiovascular: Negative for chest pain, palpitations and leg swelling.  Gastrointestinal: Negative.   Musculoskeletal: Positive for arthralgias. Negative for myalgias, back pain, joint swelling and gait problem.  Skin: Negative.   Neurological: Negative.   Hematological: Negative.   Psychiatric/Behavioral: Negative.     Vitals: But pressure: 138/82 Pulse: 90 Temperature: 97.8F Height: 5 feet 1 inch Weight: 190 pounds    Objective:   Physical Exam  Constitutional: She is oriented to person, place, and time. She appears well-developed and well-nourished.  HENT:  Head: Normocephalic and atraumatic.  Eyes: EOM are normal. Pupils are equal, round, and reactive to light.  Neck: Normal range of motion. Neck supple.  Cardiovascular: Normal rate and regular rhythm.   Pulmonary/Chest: Effort normal and breath sounds normal.  Abdominal: Soft. Bowel sounds are normal.  Musculoskeletal: Normal range of motion.  Neurological: She is alert and oriented to person, place, and time.  Skin: Skin is warm and dry.       Assessment/Plan:  1. Please see problem-oriented charting.     2. Disposition-patient will be seen back in 3 months or as needed for knee pain. Did give her prescription for Celebrex to help with her knee pain and did tell her to discontinue Mobic. Did advise her to continue with physical therapy for her knees. Advised her if she was experiencing worsening pain or new symptoms she should call our office.

## 2012-04-13 NOTE — Patient Instructions (Signed)
You were seen today for your knee pain. Stop taking Meloxicam. Start taking celebrex (Celecoxib) twice daily to see if it helps with your knee pain. Continue going to therapy to help your knee. We will see you back in 3 months. If you have problems or worsening of your knee pain please feel free to call us. Our number is [phone].

## 2012-04-13 NOTE — Assessment & Plan Note (Signed)
Continues to be diet controlled, continue current regimen. Encouraged exercise. Hemoglobin A1c less than 7 at today's visit.

## 2012-04-13 NOTE — Assessment & Plan Note (Signed)
Under good control at this time with albuterol and Advair.

## 2012-04-13 NOTE — Assessment & Plan Note (Signed)
Blood pressure fairly well-controlled at today's visit. Could increase lisinopril if needed.

## 2012-04-13 NOTE — Evaluation (Signed)
Physical Therapy Re-Evaluation  Patient Details  Name: Nicole Baxter MRN: [id number] Date of Birth: 16-May-1941  Today's Date: 04/13/2012 Time: 4098-1191 PT Time Calculation (min): 63 min Charges: TE x45, Estim: 1, 1 MMT Visit#: 4  of 12   Re-eval: 05/03/12 Assessment Diagnosis: Lt knee pain Next MD Visit: Dr. Scot Dock - 04/13/12   Past Medical History:  Past Medical History  Diagnosis Date  . Obesity   . Hyperglycemia     isolated FBS 122, 95, 104 (Random 138)  . 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 GERD (gastroesophageal reflux disease)     controlled on prilosec  . Hyperlipidemia     on pravastatin  . Hypertension     controlled on 2 agents   Past Surgical History: No past surgical history on file.  Subjective Symptoms/Limitations Symptoms: I am still having pain to my knee and I can't go up and down the stairs because of the pain.   Sensation/Coordination/Flexibility/Functional Tests Functional Tests Functional Tests: 5 STS: 12.4 sec  Assessment LLE Strength Left Hip Flexion: 4/5 (was 4/5, less trunk compensation) Left Hip Extension: 3+/5 (was 3+/5) Left Knee Flexion: 4/5 (was 4/5) Left Knee Extension: 5/5 Palpation Palpation: increased pain and tenderness to R medial joint line of knee.  Fascial restrcitons throughout medial knee and quad mus.  Mobility/Balance  Ambulation/Gait Stairs: Yes Stairs Assistance: 6: Modified independent (Device/Increase time) Stair Management Technique: One rail Left;Step to pattern     Exercise/Treatments Standing Heel Raises: 20 reps Lateral Step Up: Left;10 reps;Step Height: 4";Hand Hold: 1 Functional Squat: 15 reps;Other (comment) (with ball lift off of 8 in step) Stairs: attempted stairwell w/reciprocal pattern, unable secondary to increased pain, decreased eccentric quad strength and fear.  Rocker Board: 1 minute;Other (comment) (R<>l) SLS: L max 14 sec w/visual cueing Seated Other Seated Knee  Exercises: 2x5 STS: 17.4 sec, 12.4 sec Supine Bridges: 5 reps;Other (comment) (10 sec holds) Sidelying Clams: 5x 10 sec holds with tc for gluteal activation Prone  Hip Extension: 15 reps;Right;Left Other Prone Exercises: Heel Squeeze 5x10 sec  Modalities Modalities: Archivist Stimulation Location: To L anterior knee Electrical Stimulation Action: IFES x15 minutes Electrical Stimulation Parameters: IFES  Electrical Stimulation Goals: Pain  Physical Therapy Assessment and Plan PT Assessment and Plan Clinical Impression Statement: Nicole Baxter has attended 4 OPPT visits and has met 3/5 of her goals. Continues to have pain and demonstrates increased difficulty with descending stairs w/reciprocal pattern secondary to pain.  Pt will benefit from skilled therapeutic intervention in order to improve on the following deficits: Pain;Obesity;Increased muscle spasms;Difficulty walking;Decreased balance PT Frequency: Min 2X/week PT Duration: 4 weeks PT Treatment/Interventions: Gait training;Stair training;Functional mobility training;Therapeutic activities;Therapeutic exercise;Balance training;Neuromuscular re-education;Patient/family education;Other (comment) (manual techniques and modalities for pain control) PT Plan: Cont to progress strength, balance within limitied pain limits    Goals Home Exercise Program Pt will Perform Home Exercise Program: Independently PT Goal: Perform Home Exercise Program - Progress: Met PT Short Term Goals Time to Complete Short Term Goals: Other (comment) (4 visits) PT Short Term Goal 1: Pt will report pain less than 2/10 for 75% of her day and score a 62/80 on LEFS for improved QOL PT Short Term Goal 1 - Progress: Not met PT Short Term Goal 2: Pt will demonstrate L SLS on static surface x20 sec PT Short Term Goal 2 - Progress: Progressing toward goal (13 seconds ) PT Short Term Goal 3: Pt will improve functional  strength and demonstrate 5 STS in 15 sec.  PT Short Term Goal 3 - Progress: Met (12.4 sec) PT Short Term Goal 4: Pt will improve LE strength by 1 muscle grade in order to ascend and descend stairs with handrail, with reports of improved speed.  PT Short Term Goal 4 - Progress: Not met  Problem List Patient Active Problem List  Diagnoses  . DIABETES MELLITUS, TYPE II  . HYPERLIPIDEMIA  . OBESITY  . TREMOR, ESSENTIAL  . HYPERTENSION  . ALLERGIC RHINITIS, SEASONAL  . OCCUPATIONAL ASTHMA  . GERD  . Asthma  . Osteoarthritis of both knees  . Knee pain    PT - End of Session Activity Tolerance: Patient tolerated treatment well General Behavior During Session: [facility] for tasks performed Cognition: [facility] for tasks performed  Savannah Morford, PT 04/13/2012, 9:46 AM  Physician Documentation Your signature is required to indicate approval of the treatment plan as stated above.  Please sign and either send electronically or make a copy of this report for your files and return this physician signed original.   Please mark one 1.__approve of plan  2. ___approve of plan with the following conditions.   ______________________________                                                          _____________________ Physician Signature                                                                                                             Date

## 2012-04-17 ENCOUNTER — Ambulatory Visit (HOSPITAL_COMMUNITY)
Admission: RE | Admit: 2012-04-17 | Discharge: 2012-04-17 | Disposition: A | Discharge status: Home / Self Care | Payer: Medicare Other | Source: Ambulatory Visit | Attending: Internal Medicine | Admitting: Internal Medicine

## 2012-04-17 NOTE — Progress Notes (Signed)
Physical Therapy Treatment Patient Details  Name: Nicole Baxter MRN: [id number] Date of Birth: 11/15/1941  Today's Date: 04/17/2012 Time: 4098-1191 PT Time Calculation (min): 63 min  Visit#: 5  of 12   Re-eval: 05/03/12  Charge: therex 23 min NMR 19 min IFES/Ice 15 min   Subjective: Symptoms/Limitations Symptoms: Pt stated no pain at entrance, pain increased to 1-2/10 followng SLS and rocker board.  Objective:  Exercise/Treatments Aerobic Stationary Bike: 6'@2 .0 seat 7 Standing Lateral Step Up: Left;10 reps;Step Height: 4";Hand Hold: 1 (reduced reps due to c/o pain and noted crepitus) Forward Step Up: Left;10 reps;Hand Hold: 0;Step Height: 4" Functional Squat: 10 reps;Other (comment) (reduced reps due to c/o pain and noted crepitus) Rocker Board: 1 minute;Other (comment) (R/L) SLS: R 28", L 22" max of 3 Other Standing Knee Exercises: tandem stance 2x 30", tandem gait 1 RT; retro tandem gait 1 RT Other Standing Knee Exercises: balance beam 2RT tandem gait Seated Other Seated Knee Exercises: time Prone  Hip Extension: 15 reps;Right;Left   Modalities Modalities: Cryotherapy;Electrical Stimulation Cryotherapy Number Minutes Cryotherapy: 15 Minutes Cryotherapy Location: Knee Type of Cryotherapy: Ice pack Pharmacologist Location: To L anterior knee Electrical Stimulation Action: IFES to reduce pain Electrical Stimulation Parameters: IFES hi/lo sweep 19 voltz -->21v with ice Electrical Stimulation Goals: Pain  Physical Therapy Assessment and Plan PT Assessment and Plan Clinical Impression Statement: Balance improving, able to progress to dynamic surfaces with min cueing for spatial awareness to maintain balance before advancing next foot, min assistance required on dynamic surface following cues.  Pt continues to have pain increase with stair training, audible crepitus noted with lateral step up, stopped that exercise early today due to the  pain. PT Plan: Continue with current POC.  Progress strength and balance.    Goals    Problem List Patient Active Problem List  Diagnoses  . DIABETES MELLITUS, TYPE II  . HYPERLIPIDEMIA  . OBESITY  . TREMOR, ESSENTIAL  . HYPERTENSION  . ALLERGIC RHINITIS, SEASONAL  . OCCUPATIONAL ASTHMA  . GERD  . Asthma  . Osteoarthritis of both knees  . Knee pain    PT - End of Session Activity Tolerance: Patient tolerated treatment well General Behavior During Session: [facility] for tasks performed Cognition: [facility] for tasks performed  GP No functional reporting required  Juel Burrow, PTA 04/17/2012, 6:39 PM

## 2012-04-19 ENCOUNTER — Ambulatory Visit (HOSPITAL_COMMUNITY): Payer: Medicare Other | Admitting: Physical Therapy

## 2012-04-24 ENCOUNTER — Ambulatory Visit (HOSPITAL_COMMUNITY): Payer: Medicare Other

## 2012-04-26 ENCOUNTER — Ambulatory Visit (HOSPITAL_COMMUNITY): Payer: Medicare Other | Admitting: Physical Therapy

## 2012-05-01 ENCOUNTER — Ambulatory Visit (HOSPITAL_COMMUNITY): Payer: Medicare Other | Admitting: Physical Therapy

## 2012-05-03 ENCOUNTER — Ambulatory Visit (HOSPITAL_COMMUNITY): Payer: Medicare Other

## 2012-05-08 ENCOUNTER — Ambulatory Visit (HOSPITAL_COMMUNITY): Payer: Medicare Other

## 2012-05-10 ENCOUNTER — Ambulatory Visit (HOSPITAL_COMMUNITY): Payer: Medicare Other | Admitting: Physical Therapy

## 2012-06-04 ENCOUNTER — Emergency Department (HOSPITAL_COMMUNITY): Payer: Medicare Other

## 2012-06-04 ENCOUNTER — Emergency Department (HOSPITAL_COMMUNITY)
Admission: EM | Admit: 2012-06-04 | Discharge: 2012-06-05 | Disposition: A | Discharge status: Home / Self Care | Payer: Medicare Other | Attending: Emergency Medicine | Admitting: Emergency Medicine

## 2012-06-04 ENCOUNTER — Encounter (HOSPITAL_COMMUNITY): Payer: Self-pay | Admitting: Emergency Medicine

## 2012-06-04 DIAGNOSIS — R251 Tremor, unspecified: Secondary | ICD-10-CM | POA: Insufficient documentation

## 2012-06-04 DIAGNOSIS — R1033 Periumbilical pain: Secondary | ICD-10-CM | POA: Insufficient documentation

## 2012-06-04 DIAGNOSIS — N289 Disorder of kidney and ureter, unspecified: Secondary | ICD-10-CM | POA: Insufficient documentation

## 2012-06-04 DIAGNOSIS — I10 Essential (primary) hypertension: Secondary | ICD-10-CM | POA: Insufficient documentation

## 2012-06-04 DIAGNOSIS — K219 Gastro-esophageal reflux disease without esophagitis: Secondary | ICD-10-CM | POA: Insufficient documentation

## 2012-06-04 DIAGNOSIS — R109 Unspecified abdominal pain: Secondary | ICD-10-CM

## 2012-06-04 DIAGNOSIS — N2889 Other specified disorders of kidney and ureter: Secondary | ICD-10-CM

## 2012-06-04 DIAGNOSIS — E785 Hyperlipidemia, unspecified: Secondary | ICD-10-CM | POA: Insufficient documentation

## 2012-06-04 DIAGNOSIS — R630 Anorexia: Secondary | ICD-10-CM | POA: Insufficient documentation

## 2012-06-04 DIAGNOSIS — Z79899 Other long term (current) drug therapy: Secondary | ICD-10-CM | POA: Insufficient documentation

## 2012-06-04 HISTORY — DX: Tremor, unspecified: R25.1

## 2012-06-04 LAB — CBC WITH DIFFERENTIAL/PLATELET
Basophils Absolute: 0 10*3/uL (ref 0.0–0.1)
Basophils Relative: 0 % (ref 0–1)
Eosinophils Absolute: 0.3 10*3/uL (ref 0.0–0.7)
Eosinophils Relative: 3 % (ref 0–5)
HCT: 39.6 % (ref 36.0–46.0)
Hemoglobin: 13.1 g/dL (ref 12.0–15.0)
Lymphocytes Relative: 18 % (ref 12–46)
Lymphs Abs: 2.1 10*3/uL (ref 0.7–4.0)
MCH: 30.1 pg (ref 26.0–34.0)
MCHC: 33.1 g/dL (ref 30.0–36.0)
MCV: 91 fL (ref 78.0–100.0)
Monocytes Absolute: 0.6 10*3/uL (ref 0.1–1.0)
Monocytes Relative: 5 % (ref 3–12)
Neutro Abs: 8.6 10*3/uL — ABNORMAL HIGH (ref 1.7–7.7)
Neutrophils Relative %: 74 % (ref 43–77)
Platelets: 202 10*3/uL (ref 150–400)
RBC: 4.35 MIL/uL (ref 3.87–5.11)
RDW: 13.6 % (ref 11.5–15.5)
WBC: 11.7 10*3/uL — ABNORMAL HIGH (ref 4.0–10.5)

## 2012-06-04 LAB — URINALYSIS, ROUTINE W REFLEX MICROSCOPIC
Bilirubin Urine: NEGATIVE
Glucose, UA: NEGATIVE mg/dL
Hgb urine dipstick: NEGATIVE
Ketones, ur: 15 mg/dL — AB
Nitrite: NEGATIVE
Protein, ur: 30 mg/dL — AB
Specific Gravity, Urine: 1.027 (ref 1.005–1.030)
Urobilinogen, UA: 1 mg/dL (ref 0.0–1.0)
pH: 7.5 (ref 5.0–8.0)

## 2012-06-04 LAB — CBC
Hemoglobin: 13.6 g/dL (ref 12.0–15.0)
MCH: 30 pg (ref 26.0–34.0)
MCV: 91.2 fL (ref 78.0–100.0)
Platelets: 222 10*3/uL (ref 150–400)
RBC: 4.53 MIL/uL (ref 3.87–5.11)
WBC: 11 10*3/uL — ABNORMAL HIGH (ref 4.0–10.5)

## 2012-06-04 LAB — COMPREHENSIVE METABOLIC PANEL
ALT: 10 U/L (ref 0–35)
ALT: 11 U/L (ref 0–35)
AST: 14 U/L (ref 0–37)
AST: 18 U/L (ref 0–37)
Albumin: 3.5 g/dL (ref 3.5–5.2)
CO2: 29 mEq/L (ref 19–32)
CO2: 26 mEq/L (ref 19–32)
Calcium: 9.6 mg/dL (ref 8.4–10.5)
Calcium: 9.7 mg/dL (ref 8.4–10.5)
Chloride: 104 mEq/L (ref 96–112)
Creatinine, Ser: 0.68 mg/dL (ref 0.50–1.10)
Creatinine, Ser: 0.65 mg/dL (ref 0.50–1.10)
GFR calc Af Amer: >90 mL/min (ref >90)
GFR calc non Af Amer: 89 mL/min — ABNORMAL LOW (ref >90)
Glucose, Bld: 153 mg/dL — ABNORMAL HIGH (ref 70–99)
Sodium: 144 mEq/L (ref 135–145)
Total Bilirubin: 0.6 mg/dL (ref 0.3–1.2)
Total Protein: 8 g/dL (ref 6.0–8.3)

## 2012-06-04 LAB — URINE MICROSCOPIC-ADD ON

## 2012-06-04 MED ORDER — GI COCKTAIL ~~LOC~~
30.0000 mL | Freq: Once | ORAL | Status: AC
  Administered 2012-06-04: 30 mL via ORAL
  Filled 2012-06-04: qty 30

## 2012-06-04 MED ORDER — OMEPRAZOLE 20 MG PO CPDR: 20.0000 mg | DELAYED_RELEASE_CAPSULE | Freq: Every day | ORAL | Status: DC

## 2012-06-04 NOTE — ED Notes (Signed)
MD at bedside. 

## 2012-06-04 NOTE — ED Notes (Signed)
Pt c/o mid abd pain starting today; pt denies N/V/D

## 2012-06-04 NOTE — ED Notes (Addendum)
Pt is ready to go home; standing at nurses desk and states she needs to go; MD made aware; pt encouraged to stay and informed we are waiting on [name] results.

## 2012-06-04 NOTE — ED Provider Notes (Signed)
History     CSN: [id number]  Arrival date & time 06/04/12  1704   First MD Initiated Contact with Patient 06/04/12 2023      Chief Complaint  Patient presents with  . Abdominal Pain    (Consider location/radiation/quality/duration/timing/severity/associated sxs/prior treatment) HPI Pt presents with mid abdominal pain.  She states symptoms began this morning- primarily located around periumbilical region.  She states she has had a decreased appetite today, but no vomiting.  No changes in stools- last BM was earlier today.  There are no other associated systemic symptoms, there are no other alleviating or modifying factors. Pain described as dull and moderated.  Lasted for several hours and has now resolved.   Past Medical History  Diagnosis Date  . Obesity   . Hyperglycemia     isolated FBS 122, 95, 104 (Random 138)  . 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 GERD (gastroesophageal reflux disease)     controlled on prilosec  . Hyperlipidemia     on pravastatin  . Hypertension     controlled on 2 agents  . Tremor     History reviewed. No pertinent past surgical history.  Family History  Problem Relation Age of Onset  . Heart disease Mother 67    died of MI @ 32  . Alcohol abuse Father   . Cancer Brother     History  Substance Use Topics  . Smoking status: Never Smoker   . Smokeless tobacco: Not on file  . Alcohol Use: No    OB History    Grav Para Term Preterm Abortions TAB SAB Ect Mult Living                  Review of Systems ROS reviewed and all otherwise negative except for mentioned in HPI  Allergies  Review of patient's allergies indicates no known allergies.  Home Medications   Current Outpatient Rx  Name Route Sig Dispense Refill  . ALBUTEROL SULFATE HFA 108 (90 BASE) MCG/ACT IN AERS Inhalation Inhale 2 puffs into the lungs every 4 (four) hours as needed for wheezing. Every 4-6 hours as needed for shortness of breath. 3.7 g 6  .  FLUTICASONE-SALMETEROL 500-50 MCG/DOSE IN AEPB Inhalation Inhale 1 puff into the lungs 2 (two) times daily. 3 each 6  . HYDROCHLOROTHIAZIDE 25 MG PO TABS Oral Take 1 tablet (25 mg total) by mouth daily. 90 tablet 6  . LISINOPRIL 10 MG PO TABS Oral Take 1 tablet (10 mg total) by mouth daily. 90 tablet 6  . MELOXICAM 7.5 MG PO TABS Oral Take 1 tablet (7.5 mg total) by mouth daily. 31 tablet 3  . OMEPRAZOLE 40 MG PO CPDR Oral Take 40 mg by mouth daily.      Marland Kitchen PRAVASTATIN SODIUM 80 MG PO TABS Oral Take 1 tablet (80 mg total) by mouth daily. 90 tablet 6    BP 157/95  Pulse 100  Temp 98.3 F (36.8 C) (Oral)  Resp 18  SpO2 95% Vitals reviewed Physical Exam Physical Examination: General appearance - alert, well appearing, and in no distress Mental status - alert, oriented to person, place, and time Eyes - pupils equal and reactive, no scleral icterus Mouth - mucous membranes moist, pharynx normal without lesions Chest - clear to auscultation, no wheezes, rales or rhonchi, symmetric air entry Heart - normal rate, regular rhythm, normal S1, S2, no murmurs, rubs, clicks or gallops Abdomen - soft, very mildly tender in periumbilical  and epigastric region, nondistended, no masses or organomegaly, nabs Musculoskeletal - no joint tenderness, deformity or swelling Extremities - peripheral pulses normal, no pedal edema, no clubbing or cyanosis Skin - normal coloration and turgor, no rashes  ED Course  Procedures (including critical care time) 8:24 PM went to see patient, she is not yet in room  12:04 AM  Pt has returned from ultrasound, is awaiting results.  I have called radiologist and he will contact tech to find out delay in the ultrasound being prepared for him to read.    Labs Reviewed  CBC WITH DIFFERENTIAL - Abnormal; Notable for the following:    WBC 11.7 (*)     Neutro Abs 8.6 (*)     All other components within normal limits  COMPREHENSIVE METABOLIC PANEL - Abnormal; Notable for the  following:    Potassium 3.2 (*)     Glucose, Bld 156 (*)     Alkaline Phosphatase 120 (*)     GFR calc non Af Amer 87 (*)     All other components within normal limits  URINALYSIS, ROUTINE W REFLEX MICROSCOPIC - Abnormal; Notable for the following:    APPearance CLOUDY (*)     Ketones, ur 15 (*)     Protein, ur 30 (*)     Leukocytes, UA SMALL (*)     All other components within normal limits  CBC - Abnormal; Notable for the following:    WBC 11.0 (*)     All other components within normal limits  COMPREHENSIVE METABOLIC PANEL - Abnormal; Notable for the following:    Glucose, Bld 153 (*)     Alkaline Phosphatase 126 (*)     GFR calc non Af Amer 89 (*)     All other components within normal limits  URINE MICROSCOPIC-ADD ON - Abnormal; Notable for the following:    Squamous Epithelial / LPF FEW (*)     All other components within normal limits  LIPASE, BLOOD  LAB REPORT - SCANNED   No results found.   1. Abdominal pain   2. Renal mass       MDM  Pt with epigastric and periumbilical pain.  PT with hx of GERD and has been off her medication for several months.  Suspect GERD may be the cause of her symptoms, however alk phos is elavated, therefore abdominal ultrasound obtained.  If ultrasound negative, pt to be discharged with rx for prilosec for patient to restart.         Ethelda Chick, MD 06/07/12 [phone]

## 2012-06-04 NOTE — ED Notes (Signed)
Pt changed into a gown, does not have to void at this time. Asked pt to notify us when she does

## 2012-06-04 NOTE — ED Notes (Signed)
Patient transported to X-ray 

## 2012-06-05 NOTE — ED Notes (Signed)
Pt ambulated with a steady gait;VSS; A&Ox3; no signs of distress; respirations even and unlabored; skin warm and dry; no questions at this time.  

## 2012-06-05 NOTE — ED Notes (Signed)
Discussed results with patient including renal mass, encourage close followup.  Vida Roller, MD 06/05/12 [phone]

## 2012-06-06 ENCOUNTER — Ambulatory Visit (INDEPENDENT_AMBULATORY_CARE_PROVIDER_SITE_OTHER): Payer: Medicare Other | Admitting: Internal Medicine

## 2012-06-06 ENCOUNTER — Encounter: Payer: Self-pay | Admitting: Internal Medicine

## 2012-06-06 VITALS — BP 141/88 | HR 93 | Temp 98.4°F | Ht 61.5 in | Wt 189.2 lb

## 2012-06-06 DIAGNOSIS — N2889 Other specified disorders of kidney and ureter: Secondary | ICD-10-CM

## 2012-06-06 DIAGNOSIS — N289 Disorder of kidney and ureter, unspecified: Secondary | ICD-10-CM

## 2012-06-06 DIAGNOSIS — I10 Essential (primary) hypertension: Secondary | ICD-10-CM

## 2012-06-06 DIAGNOSIS — E785 Hyperlipidemia, unspecified: Secondary | ICD-10-CM

## 2012-06-06 DIAGNOSIS — T39395A Adverse effect of other nonsteroidal anti-inflammatory drugs [NSAID], initial encounter: Secondary | ICD-10-CM | POA: Insufficient documentation

## 2012-06-06 DIAGNOSIS — R109 Unspecified abdominal pain: Secondary | ICD-10-CM

## 2012-06-06 NOTE — Assessment & Plan Note (Signed)
She was accidentally found to have a right kidney heterogeneous mass on ultrasound. Patient doesn't have any symptoms including flank pain, dysuria or burning for UTI. It is important to rule out any possibility of cancer. We'll get an MRI-abdomen with and without contrast today. If it is negative, patient may still need to see urologist for further evaluation.

## 2012-06-06 NOTE — Assessment & Plan Note (Signed)
she is currently taking pravastatin. Her LDL was 91 on 03/08/11. She does not have any side effects from the pravastatin, such as muscle pain. Her AST and ALT were normal at 7/80/13. We'll continue the current regimen

## 2012-06-06 NOTE — Assessment & Plan Note (Signed)
she is currently taking hydrochlorothiazide and lisinopril. Her blood pressure is 141/88 today. We'll continue current regimen

## 2012-06-06 NOTE — Patient Instructions (Signed)
1. Please keep your appointment for doing MRI for your kidney 2. Please take all medications as prescribed.  3. If you have worsening of your symptoms or new symptoms arise, please call the clinic [phone]), or go to the ER immediately if symptoms are severe.

## 2012-06-06 NOTE — Assessment & Plan Note (Addendum)
Patient's recent abdominal pain is most likely caused by acid reflux. She responded to PPI treatment very well. Her abdominal pain completely resolved. She does not have any new symptoms today. We'll continue her Prilosec.

## 2012-06-06 NOTE — Progress Notes (Addendum)
Patient ID: Nicole Baxter, female   DOB: [birth year], 71 y.o.   MRN: [id number]  Subjective:   Patient ID: Nicole Baxter female   DOB: [birth year] 71 y.o.   MRN: [id number]  HPI: [name] is a 71 y.o. with a past medical history as outlined below, who presents for a hospital followup visit following her recent visit to ED.  Patient visited ED because of abdominal pain recently on 06/04/12. She had negative workup in ED, including negative for lipase and a urinalysis. Patient was diagnosed as GERD in ED and was treated with Prilosec. Patient reports that she is taking Prilosec daily. Patient's abdominal pain resolved completely. She does not have any complaints today.   In her visit to ED, she was accidentally found to have a right kidney heterogeneous mass on ultrasound. Patient doesn't have any symptoms including flank pain, dysuria or burning for UTI.  Regarding her hypertension, she is currently taking hydrochlorothiazide and lisinopril. Her blood pressure is 141/88 today.  Regarding her hyperlipidemia, she is currently taking pravastatin. She does not have any side effects, such as muscle pain. Her AST and ALT were normal at 7/80/13.      Past Medical History  Diagnosis Date  . Obesity   . Hyperglycemia     isolated FBS 122, 95, 104 (Random 138)  . 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 GERD (gastroesophageal reflux disease)     controlled on prilosec  . Hyperlipidemia     on pravastatin  . Hypertension     controlled on 2 agents  . Tremor    Current Outpatient Prescriptions  Medication Sig Dispense Refill  . albuterol (PROAIR HFA) 108 (90 BASE) MCG/ACT inhaler Inhale 2 puffs into the lungs every 4 (four) hours as needed for wheezing. Every 4-6 hours as needed for shortness of breath.  3.7 g  6  . Fluticasone-Salmeterol (ADVAIR DISKUS) 500-50 MCG/DOSE AEPB Inhale 1 puff into the lungs 2 (two) times daily.  3 each  6  . hydrochlorothiazide (HYDRODIURIL) 25 MG  tablet Take 1 tablet (25 mg total) by mouth daily.  90 tablet  6  . lisinopril (PRINIVIL,ZESTRIL) 10 MG tablet Take 1 tablet (10 mg total) by mouth daily.  90 tablet  6  . meloxicam (MOBIC) 7.5 MG tablet Take 1 tablet (7.5 mg total) by mouth daily.  31 tablet  3  . omeprazole (PRILOSEC) 20 MG capsule Take 1 capsule (20 mg total) by mouth daily.  30 capsule  0  . omeprazole (PRILOSEC) 40 MG capsule Take 40 mg by mouth daily.        . pravastatin (PRAVACHOL) 80 MG tablet Take 1 tablet (80 mg total) by mouth daily.  90 tablet  6   Family History  Problem Relation Age of Onset  . Heart disease Mother 16    died of MI @ 37  . Alcohol abuse Father   . Cancer Brother    History   Social History  . Marital Status: Married    Spouse Name: N/A    Number of Children: N/A  . Years of Education: N/A   Social History Main Topics  . Smoking status: Never Smoker   . Smokeless tobacco: None  . Alcohol Use: No  . Drug Use: No  . Sexually Active: None   Other Topics Concern  . None   Social History Narrative   Worked at [employer] 31 years. Married.Active, out and about q day.5  children, 1 with sarcoidosis, 1 with COPD.   Review of Systems:  General: no fevers, chills, no changes in body weight, no changes in appetite Skin: no rash HEENT: no blurry vision, hearing changes or sore throat Pulm: no dyspnea, coughing, wheezing CV: no chest pain, palpitations, shortness of breath Abd: no nausea/vomiting, abdominal pain, diarrhea/constipation GU: no dysuria, hematuria, polyuria Ext: no arthralgias, myalgias Neuro: no weakness, numbness, or tingling   Objective:  Physical Exam: Filed Vitals:   06/06/12 1356  BP: 141/88  Pulse: 93  Temp: 98.4 F (36.9 C)  TempSrc: Oral  Height: 5' 1.5" (1.562 m)  Weight: 189 lb 3.2 oz (85.821 kg)  SpO2: 98%   General: resting in bed, not in acute distress HEENT: PERRL, EOMI, no scleral icterus Cardiac: S1/S2, RRR, No murmurs, gallops or  rubs Pulm: Good air movement bilaterally, Clear to auscultation bilaterally, No rales, wheezing, rhonchi or rubs. Abd: Soft,  nondistended, nontender, no rebound pain, no organomegaly, BS present. There is no tenderness over CVA bilaterally. Ext: No rashes or edema, 2+DP/PT pulse bilaterally Musculoskeletal: No joint deformities, erythema, or stiffness, ROM full and no nontender Skin: no rashes. No skin bruise. Neuro: alert and oriented X3, cranial nerves II-XII grossly intact, muscle strength 5/5 in all extremeties,  sensation to light touch intact.  Psych.: patient is not psychotic, no suicidal or hemocidal ideation.   Assessment & Plan:

## 2012-06-08 ENCOUNTER — Ambulatory Visit (HOSPITAL_COMMUNITY)
Admission: RE | Admit: 2012-06-08 | Discharge: 2012-06-08 | Disposition: A | Discharge status: Home / Self Care | Payer: Medicare Other | Source: Ambulatory Visit | Attending: Internal Medicine | Admitting: Internal Medicine

## 2012-06-08 DIAGNOSIS — K7689 Other specified diseases of liver: Secondary | ICD-10-CM | POA: Insufficient documentation

## 2012-06-08 DIAGNOSIS — N289 Disorder of kidney and ureter, unspecified: Secondary | ICD-10-CM | POA: Insufficient documentation

## 2012-06-08 DIAGNOSIS — N2889 Other specified disorders of kidney and ureter: Secondary | ICD-10-CM

## 2012-06-08 MED ORDER — LORAZEPAM 2 MG/ML IJ SOLN
1.0000 mg | Freq: Once | INTRAMUSCULAR | Status: AC
  Administered 2012-06-08: 1 mg via INTRAVENOUS

## 2012-06-08 MED ORDER — LORAZEPAM 2 MG/ML IJ SOLN
INTRAMUSCULAR | Status: AC
  Filled 2012-06-08: qty 1

## 2012-06-08 MED ORDER — GADOBENATE DIMEGLUMINE 529 MG/ML IV SOLN
20.0000 mL | Freq: Once | INTRAVENOUS | Status: AC
  Administered 2012-06-08: 20 mL via INTRAVENOUS

## 2012-06-11 DIAGNOSIS — N2889 Other specified disorders of kidney and ureter: Secondary | ICD-10-CM | POA: Insufficient documentation

## 2012-06-11 NOTE — Assessment & Plan Note (Addendum)
In her recent visit to ED, she was accidentally found to have a right kidney heterogeneous mass on ultrasound. Patient doesn't have any symptoms including flank pain, dysuria or burning for UTI. MRI was done at her followup visit on 06/08/12, which showed some complexity in texture, but the size of mass has been stable compared with the mass on CT at 2008. It is recommended by radiologist to follow up with CT or MRI with contrast in one year's time. I discussed with Dr. Aundria Rud, who also suggested to follow up patient in one year with CT or MRI.

## 2012-06-21 ENCOUNTER — Ambulatory Visit (HOSPITAL_COMMUNITY)
Admission: RE | Admit: 2012-06-21 | Discharge: 2012-06-21 | Disposition: A | Discharge status: Home / Self Care | Payer: Medicare Other | Source: Ambulatory Visit | Attending: Internal Medicine | Admitting: Internal Medicine

## 2012-06-21 ENCOUNTER — Encounter: Payer: Medicare Other | Admitting: Internal Medicine

## 2012-06-21 ENCOUNTER — Encounter: Payer: Self-pay | Admitting: Internal Medicine

## 2012-06-21 ENCOUNTER — Ambulatory Visit (INDEPENDENT_AMBULATORY_CARE_PROVIDER_SITE_OTHER): Payer: Medicare Other | Admitting: Internal Medicine

## 2012-06-21 VITALS — BP 139/73 | HR 90 | Temp 97.6°F | Ht 62.5 in | Wt 189.7 lb

## 2012-06-21 DIAGNOSIS — R7309 Other abnormal glucose: Secondary | ICD-10-CM

## 2012-06-21 DIAGNOSIS — M899 Disorder of bone, unspecified: Secondary | ICD-10-CM | POA: Insufficient documentation

## 2012-06-21 DIAGNOSIS — M1990 Unspecified osteoarthritis, unspecified site: Secondary | ICD-10-CM

## 2012-06-21 DIAGNOSIS — M25569 Pain in unspecified knee: Secondary | ICD-10-CM | POA: Insufficient documentation

## 2012-06-21 DIAGNOSIS — Z1239 Encounter for other screening for malignant neoplasm of breast: Secondary | ICD-10-CM

## 2012-06-21 DIAGNOSIS — G252 Other specified forms of tremor: Secondary | ICD-10-CM

## 2012-06-21 DIAGNOSIS — M949 Disorder of cartilage, unspecified: Secondary | ICD-10-CM | POA: Insufficient documentation

## 2012-06-21 DIAGNOSIS — M25519 Pain in unspecified shoulder: Secondary | ICD-10-CM | POA: Insufficient documentation

## 2012-06-21 DIAGNOSIS — R7302 Impaired glucose tolerance (oral): Secondary | ICD-10-CM

## 2012-06-21 DIAGNOSIS — N289 Disorder of kidney and ureter, unspecified: Secondary | ICD-10-CM

## 2012-06-21 DIAGNOSIS — E785 Hyperlipidemia, unspecified: Secondary | ICD-10-CM

## 2012-06-21 DIAGNOSIS — G250 Essential tremor: Secondary | ICD-10-CM

## 2012-06-21 DIAGNOSIS — Z1231 Encounter for screening mammogram for malignant neoplasm of breast: Secondary | ICD-10-CM

## 2012-06-21 DIAGNOSIS — I10 Essential (primary) hypertension: Secondary | ICD-10-CM

## 2012-06-21 LAB — GLUCOSE, CAPILLARY: Glucose-Capillary: 143 mg/dL — ABNORMAL HIGH (ref 70–99)

## 2012-06-21 MED ORDER — PROPRANOLOL HCL ER 80 MG PO CP24: ORAL_CAPSULE | ORAL | Status: DC

## 2012-06-21 NOTE — Progress Notes (Signed)
Subjective:     Patient ID: Nicole Baxter, female   DOB: 1941/02/12, 71 y.o.   MRN: [id number]  Knee Pain  Incident onset: no injury. Incident location: not applicable. There was no injury mechanism. The pain is present in the right knee and left knee. The quality of the pain is described as aching. The pain is at a severity of 9/10. The pain is moderate. The pain has been intermittent since onset. Pertinent negatives include no inability to bear weight, muscle weakness, numbness or tingling. She reports no foreign bodies present. The symptoms are aggravated by movement and weight bearing. She has tried NSAIDs for the symptoms. The treatment provided significant relief.  Shoulder Pain  The pain is present in the left shoulder. This is a chronic problem. The current episode started more than 1 month ago. There has been no history of extremity trauma. The problem occurs intermittently. The problem has been waxing and waning. The quality of the pain is described as aching. The pain is at a severity of 9/10. The pain is moderate. Associated symptoms include a limited range of motion. Pertinent negatives include no fever, inability to bear weight, joint locking, joint swelling, numbness or tingling. The symptoms are aggravated by activity and cold. She has tried NSAIDS for the symptoms. The treatment provided significant relief. Family history does not include gout or rheumatoid arthritis. Her past medical history is significant for osteoarthritis. There is no history of gout or rheumatoid arthritis.   She states she's had no significant SOB, uses albuterol only "when it gets cold". No CP. Denies melena, hematochezia. States she's had a colonoscopy in past few years, states "two small polyps".   Admits to noticing a tremor in past year, affecting both hands, which gets better with moving her hands. She admits to having some orthopnea in past few years requiring 2 pillows, this has not changed. No PND. Denies any  LE swelling.   Review of Systems  Constitutional: Negative.  Negative for fever.  HENT: Negative.   Eyes: Negative.   Respiratory: Negative.   Cardiovascular: Negative.   Gastrointestinal: Negative.   Genitourinary: Negative.   Musculoskeletal: Positive for arthralgias. Negative for gout.  Neurological: Negative.  Negative for tingling and numbness.  Hematological: Negative.   Psychiatric/Behavioral: Negative.        Objective:   Physical Exam  Constitutional: She is oriented to person, place, and time. She appears well-developed.  HENT:  Head: Normocephalic and atraumatic.  Right Ear: External ear normal.  Left Ear: External ear normal.  Eyes: Conjunctivae and EOM are normal. Pupils are equal, round, and reactive to light. Right eye exhibits no discharge. Left eye exhibits no discharge. No scleral icterus.  Neck: Normal range of motion. No JVD present. No tracheal deviation present. No thyromegaly present.  Cardiovascular: Normal rate, regular rhythm, normal heart sounds and intact distal pulses.  Exam reveals no gallop and no friction rub.   No murmur heard. Pulmonary/Chest: Effort normal and breath sounds normal. No stridor. No respiratory distress. She has no wheezes. She has no rales. She exhibits no tenderness.  Abdominal: Soft. Bowel sounds are normal. She exhibits no distension and no mass. There is no tenderness. There is no rebound and no guarding.  Musculoskeletal:       Left shoulder: She exhibits decreased range of motion, tenderness and pain. She exhibits no swelling, no effusion, no crepitus, normal pulse and normal strength.       Right knee: She exhibits decreased range of  motion. She exhibits no swelling, no effusion and no deformity. no tenderness found.       Left knee: She exhibits decreased range of motion. She exhibits no effusion, no deformity, no laceration, no erythema, normal alignment, no LCL laxity and no bony tenderness. no tenderness found.    Lymphadenopathy:    She has no cervical adenopathy.  Neurological: She is alert and oriented to person, place, and time. No cranial nerve deficit. Coordination normal.  Skin: Skin is warm. No rash noted. No erythema. No pallor.  Psychiatric: She has a normal mood and affect. Her behavior is normal. Judgment and thought content normal.       Assessment:     71 yr. Old AAF w/ pmhx significant for HTN, glucose intolerance, OA, HL, essential tremor, obesity with Body mass index is 34.14 kg/(m^2). Mild intermittent asthma, w/ recent finding of small right kidney upper pole complex lesion.    Plan:     1) HTN: Well controlled. No changes in current therapy. 2) OA: The pain in her Left shoulder and bilat knees is likely secondary to significant OA. Obtain Xrays of both knees and left shoulder. Continue using Aleve OTC daily for now since that has helped her. She is not interested in physical therapy.  3) Right kidney mass: This appears to show stability on imaging. Will re-image in one year's time. 4) HL:  On pravachol, LFT's stable. LDL is 94. Repeat Lipid panel next visit. 5) Glucose intolerance: Repeat HbA1c next visit. She is likely heading towards DM. Diet control. 6) Health Maintenance: Repeat colonoscopy in 2014, she had a 1.5 cm sessile polyp. Needs mammogram. States she had pneumovax in past few years. 7) Essential tremor: She is agreeable to a trial of Propranolol ER 40 mg daily. Re-evaluate next visit. 8) Follow up in three months.  Jonah Blue

## 2012-06-21 NOTE — Patient Instructions (Addendum)
Please obtain xray of our shoulder and knees. Start taking propranolol and let me know if the tremors improve. Call back if you feel dizzy, have palpitations stop the drug and call me. Please obtain mammogram. Follow up in three months.

## 2012-06-26 ENCOUNTER — Telehealth: Payer: Self-pay | Admitting: *Deleted

## 2012-06-26 MED ORDER — PROPRANOLOL HCL 20 MG PO TABS: 40.0000 mg | ORAL_TABLET | Freq: Two times a day (BID) | ORAL | Status: DC

## 2012-06-26 NOTE — Telephone Encounter (Signed)
Pharm calls to clarify inderal la script, it is a capsule and cannot be broken in half, the capsule comes in 20mg , 60mg  and 80mg , which would you like and they request a 90day supply also, please send electronically

## 2012-07-02 NOTE — Telephone Encounter (Signed)
Corrected Rx called into pharmacy. Verified dose with Dr Kem Kays and he wants pt to get propranolol 40 mg twice a day. Pt will receive 3 month supply with no refills.

## 2012-07-10 ENCOUNTER — Telehealth: Payer: Self-pay | Admitting: *Deleted

## 2012-07-10 NOTE — Telephone Encounter (Signed)
Call from pt asking about medication for her Tremors.  Angelina Ok, RN 07/10/2012 8:34 AM.

## 2012-07-11 ENCOUNTER — Ambulatory Visit (HOSPITAL_COMMUNITY)
Admission: RE | Admit: 2012-07-11 | Discharge: 2012-07-11 | Disposition: A | Discharge status: Home / Self Care | Payer: Medicare Other | Source: Ambulatory Visit | Attending: Internal Medicine | Admitting: Internal Medicine

## 2012-07-11 DIAGNOSIS — Z1231 Encounter for screening mammogram for malignant neoplasm of breast: Secondary | ICD-10-CM | POA: Insufficient documentation

## 2012-07-11 NOTE — Telephone Encounter (Signed)
What was her specific question? Was she able to get this medication (propranolol)?

## 2012-07-17 ENCOUNTER — Telehealth: Payer: Self-pay | Admitting: Dietician

## 2012-07-17 NOTE — Telephone Encounter (Signed)
Were you able to speak with her about her specific question??

## 2012-07-18 NOTE — Telephone Encounter (Signed)
Nicole Jacobson, do you know if this patient has called back regarding propranolol? I'm not sure what her questions was?

## 2012-07-20 NOTE — Telephone Encounter (Signed)
i have not rec'd a call, i tried to call both her ph#'s, was able to leave a message on one line for pt to call and speak w/ triage. Will try again at a later time

## 2012-07-23 NOTE — Telephone Encounter (Signed)
Thanks, I had discussed this in person with her last Friday.

## 2012-08-16 ENCOUNTER — Telehealth: Payer: Self-pay | Admitting: *Deleted

## 2012-08-16 NOTE — Telephone Encounter (Signed)
Pt called for the past week SOB with walking. Hx of asthma. Offered appt 08/16/12 - wants to wait till 08/17/12 husband has appt at 11AM for injection in clinic. Appt made 08/16/12 11:15AM Dr Anselm Jungling. Stanton Kidney Kaeleen Odom RN 08/16/12 10AM

## 2012-08-17 ENCOUNTER — Ambulatory Visit (INDEPENDENT_AMBULATORY_CARE_PROVIDER_SITE_OTHER): Payer: Medicare Other | Admitting: Internal Medicine

## 2012-08-17 ENCOUNTER — Encounter: Payer: Self-pay | Admitting: Internal Medicine

## 2012-08-17 ENCOUNTER — Telehealth: Payer: Self-pay | Admitting: *Deleted

## 2012-08-17 VITALS — BP 130/84 | HR 76 | Temp 98.1°F | Ht 61.5 in | Wt 192.1 lb

## 2012-08-17 DIAGNOSIS — M170 Bilateral primary osteoarthritis of knee: Secondary | ICD-10-CM

## 2012-08-17 DIAGNOSIS — Z23 Encounter for immunization: Secondary | ICD-10-CM

## 2012-08-17 DIAGNOSIS — I10 Essential (primary) hypertension: Secondary | ICD-10-CM

## 2012-08-17 DIAGNOSIS — R0602 Shortness of breath: Secondary | ICD-10-CM

## 2012-08-17 MED ORDER — PREDNISONE 50 MG PO TABS: ORAL_TABLET | ORAL | Status: DC

## 2012-08-17 MED ORDER — HYDROCODONE-ACETAMINOPHEN 5-500 MG PO TABS: 1.0000 | ORAL_TABLET | Freq: Four times a day (QID) | ORAL | Status: DC | PRN

## 2012-08-17 MED ORDER — FEXOFENADINE HCL 180 MG PO TABS: 180.0000 mg | ORAL_TABLET | Freq: Every day | ORAL | Status: DC

## 2012-08-17 NOTE — Telephone Encounter (Signed)
Pt was here this am would like to get something for her Arthritis.  Pt sated that due to pain in her knees she is in constant pain and now uses a walker.  Would like to get started on medication for.  Send to her pharmacy if she is able to get something.  Angelina Ok, RN 08/17/2012 1:46 PM.

## 2012-08-17 NOTE — Progress Notes (Signed)
HPI: Mrs. Fairley is a 71 yo W with PMH of asthma, DM-diet controlled, HLP, HTN, essential tremor presents today for SOB.  She is very SOB when she walks from house to her car x over 1 week duration with wheezing She has lots of drainage post nasal  (White + clear in color), no eye itching.  No cough, fever, or chills. She has asthma and is on pro-air and advair.  She has been using her inhalers 3-4 times per day.  She denies any hx of hospital admission for her asthma or intubation.  She has asthma in the past few years.  No hx of heart failure.  No history of COPD or smoking.    ROS: as per HPI  PE: General: alert, well-developed, and cooperative to examination.  Mouth: pharynx pink and moist, no erythema, and no exudates.  Neck: supple, full ROM, no thyromegaly, no JVD Lungs: normal respiratory effort, no accessory muscle use, decreased breath sounds, no crackles, and +mild expiratory wheezing. Heart: normal rate, regular rhythm, no murmur, no gallop, and no rub.  Abdomen: soft, non-tender, normal bowel sounds, no distention, no guarding, no rebound tenderness Pulses: 2+ DP/PT pulses bilaterally Extremities: No cyanosis, clubbing, +1 pitting edema on LE Neurologic: alert & oriented X3, essential tremors of both hands Skin: turgor normal and no rashes.  Psych: Oriented X3, memory intact for recent and remote, normally interactive, good eye contact, not anxious appearing, and not depressed appearing.

## 2012-08-17 NOTE — Assessment & Plan Note (Signed)
Likely mild asthma exacerbation.  She does have faint wheezes on lung exam as well as decreased breath sounds.  Other ddx include CHF or PNA.  PNA is less likely as she is afebrile, no coughing.  No echocardiogram in chart so if she does not improve with prednisone and albuterol and allegra, we may need to get a 2D echo to further eval her heart structure.   -Start Prednisone 50mg  po qd x7 days -Continue Albuterol and Advair inhalers  -May take Sudafed PRN for post nasal drip -Follow up in 1 week if no improvement

## 2012-08-17 NOTE — Assessment & Plan Note (Signed)
Well controlled, will continue Lisinopril 10, HCTZ 25mg , and Propranol 40mg  bid.  May consider combine her ACEi-HCTZ together so less pills for patient to take next visit.

## 2012-08-17 NOTE — Telephone Encounter (Addendum)
Nicole Baxter, when I last saw her she told me that occasional Aleve was helping. I will give her a short course of Vicodin for now. She needs to return to clinic as scheduled or earlier if pain is more severe. I believe Vicodin can be called in. I put in an order for this. Please call in.

## 2012-08-17 NOTE — Patient Instructions (Addendum)
Continue taking your Pro-Air inhaler every 2-4 hours as needed for shortness of breath You may take Sudafed over the counter for decongestion Take Allegra 1 tablet daily for your allergy Take Prednisone 50mg  one tablet daily x 7 days Take Advair 1 puff twice daily Follow up in 1 week if symptoms do not improve

## 2012-08-17 NOTE — Progress Notes (Deleted)
HPI: Asthma

## 2012-08-31 NOTE — Telephone Encounter (Signed)
Opened in error

## 2012-09-18 ENCOUNTER — Other Ambulatory Visit: Payer: Self-pay | Admitting: *Deleted

## 2012-09-18 DIAGNOSIS — J64 Unspecified pneumoconiosis: Secondary | ICD-10-CM

## 2012-09-18 NOTE — Telephone Encounter (Signed)
Tried to call pt again, cannot fill prednisone w/o visit, she has scheduled an appt for in am

## 2012-09-19 ENCOUNTER — Ambulatory Visit (INDEPENDENT_AMBULATORY_CARE_PROVIDER_SITE_OTHER): Payer: Medicare Other | Admitting: Internal Medicine

## 2012-09-19 ENCOUNTER — Encounter: Payer: Self-pay | Admitting: Internal Medicine

## 2012-09-19 VITALS — BP 153/88 | HR 93 | Temp 97.0°F | Resp 20 | Ht 61.0 in | Wt 192.9 lb

## 2012-09-19 DIAGNOSIS — Z79899 Other long term (current) drug therapy: Secondary | ICD-10-CM

## 2012-09-19 DIAGNOSIS — J45901 Unspecified asthma with (acute) exacerbation: Secondary | ICD-10-CM | POA: Insufficient documentation

## 2012-09-19 DIAGNOSIS — E119 Type 2 diabetes mellitus without complications: Secondary | ICD-10-CM

## 2012-09-19 LAB — LIPID PANEL
Cholesterol: 214 mg/dL — ABNORMAL HIGH (ref 0–200)
HDL: 62 mg/dL (ref >39)
Triglycerides: 68 mg/dL (ref <150)

## 2012-09-19 MED ORDER — PREDNISONE 20 MG PO TABS: ORAL_TABLET | ORAL | Status: DC

## 2012-09-19 NOTE — Progress Notes (Signed)
  Subjective:    Patient ID: Nicole Baxter, female    DOB: 1941-11-17, 71 y.o.   MRN: [id number]  HPI Reports that she has increased sob and wheezing for the past 3 days.  Denies fever or chills, nausea or vomiting but states that she has had hot slashes. Reports that she is compliant with Advair inhaler but has had to increase her Albuterol usage to 2-3 times a day.  She further states that this is similar to her asthma exacerbation in September which resolved after a short course of Prednisone but pt states that she feels the course was too short.   Review of Systems  Constitutional: Negative for fever and fatigue.  HENT: Negative for congestion, rhinorrhea and sneezing.   Respiratory: Positive for chest tightness, shortness of breath and wheezing. Negative for cough.   Cardiovascular: Negative for chest pain.  Gastrointestinal: Negative for abdominal pain.  Neurological: Positive for tremors. Negative for weakness and headaches.       Objective:   Physical Exam  Constitutional: She is oriented to person, place, and time. She appears well-developed and well-nourished. No distress.  HENT:  Head: Normocephalic and atraumatic.  Eyes: Conjunctivae normal and EOM are normal. Pupils are equal, round, and reactive to light.  Cardiovascular: Normal rate and regular rhythm.   Pulmonary/Chest: Effort normal. She has wheezes (few wheezes (pt states she just used inhaler)). She has no rales.  Abdominal: Soft. Bowel sounds are normal.  Neurological: She is alert and oriented to person, place, and time.       Positive tremor  Skin: Skin is warm and dry.  Psychiatric: She has a normal mood and affect.          Assessment & Plan:  1. Asthma exacerbation: likely secondary to environmental triggers since pt states that "the change in weather" with the cool air initiated her symptoms -prednisone 14 day taper -cont Advair and albuterol

## 2012-09-19 NOTE — Patient Instructions (Signed)
I have prescribed a longer course of Prednisone for you. Take 3 pills (60 mg) once a day for 3 days THEN 2 pills (40 mg) once a day for 3 days THEN 1 pill (20 mg) once a day for 3 days THEN 1/2 a pill (10 mg) for 3 days THEN STOP. Continue your inhalers as prescribed.

## 2012-10-22 ENCOUNTER — Other Ambulatory Visit: Payer: Self-pay | Admitting: *Deleted

## 2012-10-24 MED ORDER — ALBUTEROL SULFATE HFA 108 (90 BASE) MCG/ACT IN AERS: 2.0000 | INHALATION_SPRAY | RESPIRATORY_TRACT | Status: DC | PRN

## 2012-11-15 ENCOUNTER — Ambulatory Visit (HOSPITAL_COMMUNITY)
Admission: RE | Admit: 2012-11-15 | Discharge: 2012-11-15 | Disposition: A | Discharge status: Home / Self Care | Payer: Medicare Other | Source: Ambulatory Visit | Attending: Internal Medicine | Admitting: Internal Medicine

## 2012-11-15 ENCOUNTER — Inpatient Hospital Stay (HOSPITAL_COMMUNITY): Admission: RE | Admit: 2012-11-15 | Payer: Medicare Other | Source: Ambulatory Visit

## 2012-11-15 DIAGNOSIS — J45909 Unspecified asthma, uncomplicated: Secondary | ICD-10-CM | POA: Insufficient documentation

## 2012-11-15 DIAGNOSIS — E119 Type 2 diabetes mellitus without complications: Secondary | ICD-10-CM | POA: Insufficient documentation

## 2012-11-15 LAB — BLOOD GAS, ARTERIAL
Acid-Base Excess: 4.3 mmol/L — ABNORMAL HIGH (ref 0.0–2.0)
Drawn by: 22766
TCO2: 25 mmol/L (ref 0–100)
pCO2 arterial: 42 mmHg (ref 35.0–45.0)
pO2, Arterial: 81.4 mmHg (ref 80.0–100.0)

## 2012-11-15 MED ORDER — ALBUTEROL SULFATE (5 MG/ML) 0.5% IN NEBU
2.5000 mg | INHALATION_SOLUTION | Freq: Once | RESPIRATORY_TRACT | Status: AC
  Administered 2012-11-15: 2.5 mg via RESPIRATORY_TRACT

## 2012-11-16 NOTE — Procedures (Signed)
NAME:  MARCA, GADSBY                  ACCOUNT NO.:  [id number]  MEDICAL RECORD NO.:  [id number]  LOCATION:  RESP                          FACILITY:  [facility]  PHYSICIAN:  Torrance Frech L. Juanetta Gosling, M.D.DATE OF BIRTH:  1941/03/07  DATE OF PROCEDURE: DATE OF DISCHARGE:  11/15/2012                           PULMONARY FUNCTION TEST   Reason for pulmonary function testing is asthma. 1. Spirometry shows a mild ventilatory defect with airflow     obstruction. 2. Lung volumes are not done. 3. DLCO is minimally reduced, but note that there may be an error. 4. Arterial blood gases are normal. 5. There is no significant bronchodilator improvement. 6. This study is consistent with asthma, but it is incomplete.     Marcella Dunnaway L. Juanetta Gosling, M.D.     [facility]/MEDQ  D:  11/15/2012  T:  11/16/2012  Job:  [id number]

## 2012-12-13 LAB — PULMONARY FUNCTION TEST

## 2012-12-27 ENCOUNTER — Encounter: Payer: Self-pay | Admitting: Gastroenterology

## 2013-01-29 ENCOUNTER — Other Ambulatory Visit: Payer: Self-pay | Admitting: *Deleted

## 2013-01-29 MED ORDER — ALBUTEROL SULFATE HFA 108 (90 BASE) MCG/ACT IN AERS: 2.0000 | INHALATION_SPRAY | RESPIRATORY_TRACT | Status: DC | PRN

## 2013-06-03 ENCOUNTER — Emergency Department (HOSPITAL_COMMUNITY)
Admission: EM | Admit: 2013-06-03 | Discharge: 2013-06-03 | Disposition: A | Discharge status: Home / Self Care | Payer: Medicare Other | Attending: Emergency Medicine | Admitting: Emergency Medicine

## 2013-06-03 ENCOUNTER — Encounter (HOSPITAL_COMMUNITY): Payer: Self-pay

## 2013-06-03 DIAGNOSIS — Z862 Personal history of diseases of the blood and blood-forming organs and certain disorders involving the immune mechanism: Secondary | ICD-10-CM | POA: Insufficient documentation

## 2013-06-03 DIAGNOSIS — E785 Hyperlipidemia, unspecified: Secondary | ICD-10-CM | POA: Insufficient documentation

## 2013-06-03 DIAGNOSIS — Z8669 Personal history of other diseases of the nervous system and sense organs: Secondary | ICD-10-CM | POA: Insufficient documentation

## 2013-06-03 DIAGNOSIS — R630 Anorexia: Secondary | ICD-10-CM | POA: Insufficient documentation

## 2013-06-03 DIAGNOSIS — K5900 Constipation, unspecified: Secondary | ICD-10-CM | POA: Insufficient documentation

## 2013-06-03 DIAGNOSIS — Z8639 Personal history of other endocrine, nutritional and metabolic disease: Secondary | ICD-10-CM | POA: Insufficient documentation

## 2013-06-03 DIAGNOSIS — Z8709 Personal history of other diseases of the respiratory system: Secondary | ICD-10-CM | POA: Insufficient documentation

## 2013-06-03 DIAGNOSIS — Z79899 Other long term (current) drug therapy: Secondary | ICD-10-CM | POA: Insufficient documentation

## 2013-06-03 DIAGNOSIS — IMO0002 Reserved for concepts with insufficient information to code with codable children: Secondary | ICD-10-CM | POA: Insufficient documentation

## 2013-06-03 DIAGNOSIS — I10 Essential (primary) hypertension: Secondary | ICD-10-CM | POA: Insufficient documentation

## 2013-06-03 DIAGNOSIS — K219 Gastro-esophageal reflux disease without esophagitis: Secondary | ICD-10-CM | POA: Insufficient documentation

## 2013-06-03 DIAGNOSIS — K625 Hemorrhage of anus and rectum: Secondary | ICD-10-CM | POA: Insufficient documentation

## 2013-06-03 DIAGNOSIS — E669 Obesity, unspecified: Secondary | ICD-10-CM | POA: Insufficient documentation

## 2013-06-03 LAB — OCCULT BLOOD, POC DEVICE: Fecal Occult Bld: POSITIVE — AB

## 2013-06-03 LAB — CBC WITH DIFFERENTIAL/PLATELET
Basophils Absolute: 0 10*3/uL (ref 0.0–0.1)
Basophils Relative: 1 % (ref 0–1)
Eosinophils Absolute: 0.3 10*3/uL (ref 0.0–0.7)
Eosinophils Relative: 4 % (ref 0–5)
HCT: 43.4 % (ref 36.0–46.0)
MCH: 31 pg (ref 26.0–34.0)
MCHC: 34.1 g/dL (ref 30.0–36.0)
MCV: 91 fL (ref 78.0–100.0)
Monocytes Absolute: 0.5 10*3/uL (ref 0.1–1.0)
Neutro Abs: 4.6 10*3/uL (ref 1.7–7.7)
RDW: 13.5 % (ref 11.5–15.5)

## 2013-06-03 LAB — BASIC METABOLIC PANEL
Calcium: 10.4 mg/dL (ref 8.4–10.5)
Creatinine, Ser: 0.82 mg/dL (ref 0.50–1.10)
GFR calc Af Amer: 82 mL/min — ABNORMAL LOW (ref >90)
GFR calc non Af Amer: 71 mL/min — ABNORMAL LOW (ref >90)

## 2013-06-03 NOTE — ED Notes (Signed)
Patient with no complaints at this time. Respirations even and unlabored. Skin warm/dry. Discharge instructions reviewed with patient at this time. Patient given opportunity to voice concerns/ask questions. Patient discharged at this time and left Emergency Department with steady gait.   

## 2013-06-03 NOTE — ED Notes (Signed)
Pt reports being constipated and took a stool softener yesterday, stated her bowels moved and she went shopping, while in the store had to go to the bathroom and had another bm, it was bright red in toilet bowl.  This occurred approx. 10 min pta. Pt denies any other complaints, stated she has been having black stools off/on for several months.

## 2013-06-03 NOTE — ED Notes (Signed)
Assisted MD with Rectal exam.

## 2013-06-03 NOTE — ED Provider Notes (Signed)
History    This chart was scribed for Flint Melter, MD, by Yevette Edwards, ED Scribe. This patient was seen in room [facility] and the patient's care was started at 2:11 PM.  CSN: [id number] Arrival date & time 06/03/13  1251  First MD Initiated Contact with Patient 06/03/13 1400     Chief Complaint  Patient presents with  . Rectal Bleeding   The history is provided by the patient. No language interpreter was used.    HPI Comments: Nicole Baxter is a 72 y.o. female who presents to the Emergency Department complaining of constipation which has been occurring for the past several days. The pt stated that after taking a stool softener yesterday afternoon, she had a BM early this morning. She has since experienced three BM, but the last BM had hematachezia present.  Prior to her episodes of constipation, the pt reported she had dark colored stool. The pt reports having had a decreased appetite, though it improved yesterday, but was again decreased today. The pt  also reports feeling as if her abdomen becomes hot. She denies experiencing back pain, chest pain, or dizziness.  She also has a h/o of anemia, though she is not presently taking iron pills. She also has a h/o of hyperglycemia, GERD, hyperlipidemia, and HTN. She denies smoking, and she denies using alcohol.   Past Medical History  Diagnosis Date  . Obesity   . Hyperglycemia     isolated FBS 122, 95, 104 (Random 138)  . 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 GERD (gastroesophageal reflux disease)     controlled on prilosec  . Hyperlipidemia     on pravastatin  . Hypertension     controlled on 2 agents  . Tremor    History reviewed. No pertinent past surgical history. Family History  Problem Relation Age of Onset  . Heart disease Mother 9    died of MI @ 78  . Alcohol abuse Father   . Cancer Brother    History  Substance Use Topics  . Smoking status: Never Smoker   . Smokeless tobacco: Not on file   . Alcohol Use: No   No OB history provided.  Review of Systems  Constitutional: Positive for appetite change. Negative for fever.  Cardiovascular: Negative for chest pain.  Gastrointestinal: Positive for constipation and blood in stool.  Musculoskeletal: Negative for back pain.  Neurological: Negative for dizziness.    Allergies  Review of patient's allergies indicates no known allergies.  Home Medications   Current Outpatient Rx  Name  Route  Sig  Dispense  Refill  . fexofenadine (ALLEGRA) 180 MG tablet   Oral   Take 1 tablet (180 mg total) by mouth daily.   30 tablet   6   . hydrochlorothiazide (HYDRODIURIL) 25 MG tablet   Oral   Take 1 tablet (25 mg total) by mouth daily.   90 tablet   6   . lisinopril (PRINIVIL,ZESTRIL) 20 MG tablet   Oral   Take 20 mg by mouth daily.         . meloxicam (MOBIC) 7.5 MG tablet   Oral   Take 1 tablet (7.5 mg total) by mouth daily.   31 tablet   3   . omeprazole (PRILOSEC) 40 MG capsule   Oral   Take 40 mg by mouth daily.           . pravastatin (PRAVACHOL) 80 MG tablet  Oral   Take 1 tablet (80 mg total) by mouth daily.   90 tablet   6   . albuterol (PROAIR HFA) 108 (90 BASE) MCG/ACT inhaler   Inhalation   Inhale 2 puffs into the lungs every 4 (four) hours as needed for wheezing.   3.7 g   6   . Fluticasone-Salmeterol (ADVAIR) 500-50 MCG/DOSE AEPB   Inhalation   Inhale 1 puff into the lungs 2 (two) times daily as needed (Asthma).          Triage Vitals: BP 133/74  Pulse 105  Temp(Src) 98.4 F (36.9 C) (Oral)  Resp 18  Ht 5' 1.5" (1.562 m)  Wt 165 lb (74.844 kg)  BMI 30.68 kg/m2  SpO2 100%  Physical Exam  Nursing note and vitals reviewed. Constitutional: She is oriented to person, place, and time. She appears well-developed and well-nourished. No distress.  HENT:  Head: Normocephalic and atraumatic.  Eyes: EOM are normal.  Neck: Neck supple. No tracheal deviation present.  Cardiovascular: Normal  rate.   Pulmonary/Chest: Effort normal. No respiratory distress.  Abdominal: There is no tenderness.  Genitourinary:  Small amount of blood with stool on examining finger. Exam was normal. No mass. No hemorrhoids.   Musculoskeletal: Normal range of motion.  Neurological: She is alert and oriented to person, place, and time.  Skin: Skin is warm and dry.  Psychiatric: She has a normal mood and affect. Her behavior is normal.    ED Course  Procedures (including critical care time)  Patient Vitals for the past 24 hrs:  BP Temp Temp src Pulse Resp SpO2 Height Weight  06/03/13 1804 110/86 mmHg - - - - - - -  06/03/13 1717 93/73 mmHg - - 111 - - - -  06/03/13 1716 101/73 mmHg - - 98 - - - -  06/03/13 1715 87/64 mmHg - - 80 - - - -  06/03/13 1656 110/72 mmHg - - 75 23 96 % - -  06/03/13 1535 96/61 mmHg - - 86 21 98 % - -  06/03/13 1255 133/74 mmHg 98.4 F (36.9 C) Oral 105 18 100 % 5' 1.5" (1.562 m) 165 lb (74.844 kg)    6:05 PM Reevaluation with update and discussion. After initial assessment and treatment, an updated evaluation reveals BP improved after ambulating fo 3 minutes in the ED evaluation room. She feels well. No more rectal bleeding. [name] L   DIAGNOSTIC STUDIES: Oxygen Saturation is 100% on room air, normal by my interpretation.    COORDINATION OF CARE:  2:17 PM- Discussed treatment plan with pt, and the pt agreed.    Labs Reviewed  BASIC METABOLIC PANEL - Abnormal; Notable for the following:    Potassium 3.3 (*)    GFR calc non Af Amer 71 (*)    GFR calc Af Amer 82 (*)    All other components within normal limits  OCCULT BLOOD, POC DEVICE - Abnormal; Notable for the following:    Fecal Occult Bld POSITIVE (*)    All other components within normal limits  CBC WITH DIFFERENTIAL    1. Rectal bleed     MDM  Rectal bleed, source unclear, with normal hemodynamic status, and hemoglobin. No visible source of bleeding. The patient is stable for discharge  with close management as an outpatient, by her PCP. Doubt metabolic instability, serious bacterial infection or impending vascular collapse; the patient is stable for discharge.  Nursing Notes Reviewed/ Care Coordinated, and agree without changes. Applicable Imaging Reviewed.  Interpretation of Laboratory Data incorporated into ED treatment  I personally performed the services described in this documentation, which was scribed in my presence. The recorded information has been reviewed and is accurate.      Flint Melter, MD 06/03/13 2156

## 2013-06-06 ENCOUNTER — Other Ambulatory Visit: Payer: Self-pay

## 2013-07-25 ENCOUNTER — Other Ambulatory Visit (HOSPITAL_COMMUNITY): Payer: Self-pay | Admitting: Internal Medicine

## 2013-07-25 DIAGNOSIS — R946 Abnormal results of thyroid function studies: Secondary | ICD-10-CM

## 2013-07-30 ENCOUNTER — Ambulatory Visit (HOSPITAL_COMMUNITY)
Admission: RE | Admit: 2013-07-30 | Discharge: 2013-07-30 | Disposition: A | Discharge status: Home / Self Care | Payer: Medicare Other | Source: Ambulatory Visit | Attending: Internal Medicine | Admitting: Internal Medicine

## 2013-07-30 DIAGNOSIS — R946 Abnormal results of thyroid function studies: Secondary | ICD-10-CM

## 2013-07-30 DIAGNOSIS — E042 Nontoxic multinodular goiter: Secondary | ICD-10-CM | POA: Insufficient documentation

## 2013-12-04 ENCOUNTER — Encounter: Payer: Self-pay | Admitting: Internal Medicine

## 2013-12-04 ENCOUNTER — Ambulatory Visit (INDEPENDENT_AMBULATORY_CARE_PROVIDER_SITE_OTHER): Payer: Medicare HMO | Admitting: Internal Medicine

## 2013-12-04 VITALS — BP 129/80 | HR 78 | Temp 97.3°F | Ht 61.0 in | Wt 162.9 lb

## 2013-12-04 DIAGNOSIS — N2889 Other specified disorders of kidney and ureter: Secondary | ICD-10-CM

## 2013-12-04 DIAGNOSIS — G250 Essential tremor: Secondary | ICD-10-CM

## 2013-12-04 DIAGNOSIS — R262 Difficulty in walking, not elsewhere classified: Secondary | ICD-10-CM

## 2013-12-04 DIAGNOSIS — M170 Bilateral primary osteoarthritis of knee: Secondary | ICD-10-CM

## 2013-12-04 DIAGNOSIS — N289 Disorder of kidney and ureter, unspecified: Secondary | ICD-10-CM

## 2013-12-04 DIAGNOSIS — I10 Essential (primary) hypertension: Secondary | ICD-10-CM

## 2013-12-04 DIAGNOSIS — M1710 Unilateral primary osteoarthritis, unspecified knee: Secondary | ICD-10-CM

## 2013-12-04 DIAGNOSIS — E785 Hyperlipidemia, unspecified: Secondary | ICD-10-CM

## 2013-12-04 DIAGNOSIS — E119 Type 2 diabetes mellitus without complications: Secondary | ICD-10-CM

## 2013-12-04 DIAGNOSIS — G252 Other specified forms of tremor: Secondary | ICD-10-CM

## 2013-12-04 DIAGNOSIS — IMO0002 Reserved for concepts with insufficient information to code with codable children: Secondary | ICD-10-CM

## 2013-12-04 LAB — LIPID PANEL
CHOL/HDL RATIO: 3 ratio
CHOLESTEROL: 221 mg/dL — AB (ref 0–200)
HDL: 74 mg/dL (ref >39)
LDL Cholesterol: 123 mg/dL — ABNORMAL HIGH (ref 0–99)
TRIGLYCERIDES: 121 mg/dL (ref <150)
VLDL: 24 mg/dL (ref 0–40)

## 2013-12-04 LAB — BASIC METABOLIC PANEL WITH GFR
BUN: 11 mg/dL (ref 6–23)
CALCIUM: 9.5 mg/dL (ref 8.4–10.5)
CO2: 28 mEq/L (ref 19–32)
CREATININE: 0.75 mg/dL (ref 0.50–1.10)
Chloride: 103 mEq/L (ref 96–112)
GFR, Est Non African American: 81 mL/min
GLUCOSE: 77 mg/dL (ref 70–99)
POTASSIUM: 3.4 meq/L — AB (ref 3.5–5.3)
Sodium: 140 mEq/L (ref 135–145)

## 2013-12-04 LAB — GLUCOSE, CAPILLARY: Glucose-Capillary: 79 mg/dL (ref 70–99)

## 2013-12-04 LAB — POCT GLYCOSYLATED HEMOGLOBIN (HGB A1C): Hemoglobin A1C: 5.2

## 2013-12-04 NOTE — Patient Instructions (Signed)
1. Please continue aspercream as needed for your knees.     2. Please take all medications as prescribed.    3. If you have worsening of your symptoms or new symptoms arise, please call the clinic [phone]), or go to the ER immediately if symptoms are severe.  4.  You will be contacted with appointment times for your MRI and neurorehab evaluation.  5.  Please return to clinic in 3 months for follow-up.

## 2013-12-04 NOTE — Progress Notes (Signed)
   Subjective:    Patient ID: Nicole Baxter, female    DOB: 05/18/1941, 73 y.o.   MRN: [id number]  HPI Comments: [name] is a 73 year old woman with a PMH of HTN, diet controlled DM type 2 (HgbA1c 5.2 today), knee OA and essential tremor presenting to re-establish care with [facility].  Her only complaint is bilateral knee and right shoulder pain.  She denies trauma and has history of OA.  She says Aspercreme is working better than any other medications to control her pain.  She uses it about three times per week.  She also wears knee braces that help.  She is comfortable right now.  Her DM has been diet controlled and she has never been prescribed medications.  She reports compliance with her medications and her BP is well controlled at 129/80 today.     Review of Systems  Constitutional: Negative for fever, chills and appetite change.  HENT: Negative for hearing loss.   Eyes: Negative for visual disturbance.  Respiratory: Negative for cough, shortness of breath and wheezing.   Cardiovascular: Negative for chest pain and palpitations.  Gastrointestinal: Negative for nausea, vomiting, abdominal pain, diarrhea, constipation and blood in stool.  Endocrine: Negative for polyuria.  Genitourinary: Negative for dysuria, hematuria, vaginal bleeding and vaginal discharge.  Neurological: Negative for dizziness, syncope, weakness and light-headedness.       Objective:   Physical Exam  Vitals reviewed. Constitutional: She is oriented to person, place, and time. She appears well-developed. No distress.  HENT:  Head: Normocephalic and atraumatic.  Mouth/Throat: Oropharynx is clear and moist. No oropharyngeal exudate.  Eyes: EOM are normal. Pupils are equal, round, and reactive to light. No scleral icterus.  Neck: Neck supple. No JVD present.  Cardiovascular: Normal rate, regular rhythm and normal heart sounds.   Pulmonary/Chest: Effort normal and breath sounds normal. No respiratory distress. She has no  wheezes. She has no rales.  Abdominal: Soft. Bowel sounds are normal. She exhibits no distension. There is no tenderness.  Musculoskeletal: Normal range of motion. She exhibits no edema and no tenderness.  Slow gait that improved with use of her cane; resting tremor of hands, no pill-rolling tremor noted  Lymphadenopathy:    She has no cervical adenopathy.  Neurological: She is alert and oriented to person, place, and time. No cranial nerve deficit. She exhibits normal muscle tone.  Skin: Skin is warm. She is not diaphoretic.  Psychiatric: She has a normal mood and affect. Her behavior is normal.          Assessment & Plan:  Please see problem based assessment and plan.

## 2013-12-06 DIAGNOSIS — R262 Difficulty in walking, not elsewhere classified: Secondary | ICD-10-CM | POA: Insufficient documentation

## 2013-12-06 NOTE — Assessment & Plan Note (Signed)
She had slow, hesitant gait while ambulating in the exam room without her cane.  This improved markedly with the use of her cane.  Will refer to Neuro Rehab.

## 2013-12-06 NOTE — Assessment & Plan Note (Signed)
Lab Results  Component Value Date   HGBA1C 5.2 12/04/2013   HGBA1C 6.8 09/19/2012   HGBA1C 6.3 04/13/2012     Assessment: Diabetes control:  very well controlled with diet Progress toward A1C goal:   at goal Comments: Her HgbA1c is 5.2 which is non-DM range.  Plan: Medications:  None Other plans: Continue healthy lifestyle.  Will check Hgb A1c in 3 months.

## 2013-12-06 NOTE — Assessment & Plan Note (Addendum)
She says Aspercreme is working well for her.  She uses it a few times per week.  She will continue to use it for now and will follow-up at next visit.

## 2013-12-06 NOTE — Assessment & Plan Note (Signed)
Tremor is notable in her hands at rest.  She is not bothered by it and it is not interfering in functioning.  Will continue to monitor and can offer medication in the future if needed.

## 2013-12-06 NOTE — Assessment & Plan Note (Signed)
BP Readings from Last 3 Encounters:  12/04/13 129/80  06/03/13 110/86  09/19/12 153/88    Lab Results  Component Value Date   NA 140 12/04/2013   K 3.4* 12/04/2013   CREATININE 0.75 12/04/2013    Assessment: Blood pressure control:  well controlled Progress toward BP goal:   at goal Comments: She reports compliance with her medications  Plan: Medications:  Continue lisinopril 20mg  daily and HCTZ 25mg  daily

## 2013-12-06 NOTE — Assessment & Plan Note (Signed)
Currently on pravastatin 80mg  daily.  Last lipid check was 08/2012.  Will check lipids today.

## 2013-12-06 NOTE — Assessment & Plan Note (Signed)
Patient was advised to get a follow-up CT or MRI.  Will order MRI today.

## 2013-12-09 NOTE — Progress Notes (Signed)
Case discussed with Dr. Wilson at the time of the visit.  We reviewed the resident's history and exam and pertinent patient test results.  I agree with the assessment, diagnosis, and plan of care documented in the resident's note. 

## 2013-12-18 ENCOUNTER — Encounter: Payer: Self-pay | Admitting: *Deleted

## 2013-12-23 ENCOUNTER — Telehealth: Payer: Self-pay | Admitting: Internal Medicine

## 2013-12-23 NOTE — Telephone Encounter (Signed)
I attempted to return [name]' call.  There was no answer on her land-line or mobile phone.  I left a message on her mobile voicemail for her to call the clinic.

## 2013-12-24 ENCOUNTER — Ambulatory Visit: Payer: Medicare Other | Admitting: Internal Medicine

## 2013-12-24 ENCOUNTER — Ambulatory Visit (INDEPENDENT_AMBULATORY_CARE_PROVIDER_SITE_OTHER): Payer: Medicare HMO | Admitting: Internal Medicine

## 2013-12-24 ENCOUNTER — Encounter: Payer: Self-pay | Admitting: Internal Medicine

## 2013-12-24 VITALS — BP 121/80 | HR 90 | Temp 97.1°F | Ht 61.0 in | Wt 160.9 lb

## 2013-12-24 DIAGNOSIS — IMO0002 Reserved for concepts with insufficient information to code with codable children: Secondary | ICD-10-CM

## 2013-12-24 DIAGNOSIS — M79601 Pain in right arm: Secondary | ICD-10-CM

## 2013-12-24 DIAGNOSIS — R7989 Other specified abnormal findings of blood chemistry: Secondary | ICD-10-CM

## 2013-12-24 DIAGNOSIS — E119 Type 2 diabetes mellitus without complications: Secondary | ICD-10-CM

## 2013-12-24 DIAGNOSIS — M1710 Unilateral primary osteoarthritis, unspecified knee: Secondary | ICD-10-CM

## 2013-12-24 DIAGNOSIS — E785 Hyperlipidemia, unspecified: Secondary | ICD-10-CM

## 2013-12-24 DIAGNOSIS — M170 Bilateral primary osteoarthritis of knee: Secondary | ICD-10-CM

## 2013-12-24 DIAGNOSIS — M791 Myalgia, unspecified site: Secondary | ICD-10-CM

## 2013-12-24 DIAGNOSIS — M79609 Pain in unspecified limb: Secondary | ICD-10-CM

## 2013-12-24 LAB — SEDIMENTATION RATE: SED RATE: 16 mm/h (ref 0–22)

## 2013-12-24 LAB — GLUCOSE, CAPILLARY
GLUCOSE-CAPILLARY: 115 mg/dL — AB (ref 70–99)
Glucose-Capillary: 69 mg/dL — ABNORMAL LOW (ref 70–99)

## 2013-12-24 MED ORDER — ATORVASTATIN CALCIUM 80 MG PO TABS: 80.0000 mg | ORAL_TABLET | Freq: Every day | ORAL | Status: DC

## 2013-12-24 MED ORDER — ATORVASTATIN CALCIUM 40 MG PO TABS: 40.0000 mg | ORAL_TABLET | Freq: Every day | ORAL | Status: DC

## 2013-12-24 NOTE — Assessment & Plan Note (Signed)
She says Tylenol has not worked for her in the past.  She was advised to increase NSAID for a short time (1-2 weeks) for her arm pain.  This will likely help knee pain as well.  Will follow-up in 1 month.

## 2013-12-24 NOTE — Patient Instructions (Addendum)
1. Please increase Aleve to 2 pills (440mg  naproxen sodium) two times per day while you are having your muscle pain.  Take with food.  Do not take for more than 1 week.  Continue to take Prilosec to protect your stomach. You can also use ice (wrap in towel before putting on your skin) for short periods as needed.   You will be contacted if there are lab abnormalities.  2. Please take all medications as prescribed.    3. If you have worsening of your symptoms or new symptoms arise, please call the clinic ([phone]), or go to the ER immediately if symptoms are severe.   4. Return to office in 1 month for follow-up.  Come back sooner if new or worsening symptoms or if you develop muscle weakness.

## 2013-12-24 NOTE — Progress Notes (Signed)
Hypoglycemic Event  CBG: 69  Treatment: Given orange juice and crackers Symptoms: no symptoms  Follow-up CBG: Time: 3:00 PM CBG Result: 115 Possible Reasons for Event: did not eat  Comments/MD notified; Dr. Sinda DuWilson    Faryn Sieg, Lars MassonGladys Foust  Remember to initiate Hypoglycemia Order Set & complete

## 2013-12-24 NOTE — Progress Notes (Signed)
   Subjective:    Patient ID: Nicole Baxter, female    DOB: 08/09/1941, 73 y.o.   MRN: [id number]  HPI Comments: [name] is a 73 year old woman with a PMH of HTN , DM type 2 (diet controlled, Hgb A1c 5.29 November 2013), knee OA and essential tremor.  She presents for increased pain in her knees and right arm for the past few days.  The arm pain first occurred last year but has been under control until a few days ago.  The pain is 5/10 currently but 10/10 at its worse.  No recent trauma or increased activity.   She denies neck pain, chest pain or dyspnea.  She sleeps on the right arm and the pain is waking her from sleep around 4AM the past few night.  When she awakes, repositioning and cold improve her pain.  Aleve, Bayer have not helped much.  She has tried Tylenol for pain in the past but not found it helpful.  Aspercreme was working but not working as well as before.       Review of Systems  Constitutional: Negative for appetite change and unexpected weight change.  Respiratory: Negative for shortness of breath.   Cardiovascular: Negative for chest pain.  Gastrointestinal: Negative for nausea, vomiting, abdominal pain, diarrhea, constipation and blood in stool.  Musculoskeletal: Positive for arthralgias and back pain.       Osteoarthritis  Neurological: Negative for dizziness, weakness and light-headedness.       Objective:   Physical Exam  Constitutional: She is oriented to person, place, and time. She appears well-developed. No distress.  HENT:  Mouth/Throat: Oropharynx is clear and moist. No oropharyngeal exudate.  Eyes: Pupils are equal, round, and reactive to light.  Cardiovascular: Normal rate, regular rhythm and normal heart sounds.   Pulmonary/Chest: Effort normal and breath sounds normal. No respiratory distress. She has no wheezes. She has no rales.  Abdominal: Soft. Bowel sounds are normal. She exhibits no distension. There is no tenderness.  Musculoskeletal: She exhibits  tenderness.  Pain at right anterior shoulder and over right lateral bicep.  Full ROM, strength and sensation intact.  No muscle atrophy.  No popeye deformity.  Neurological: She is alert and oriented to person, place, and time.  Skin: Skin is warm. She is not diaphoretic.  Psychiatric: She has a normal mood and affect. Her behavior is normal.          Assessment & Plan:  Please see problem based assessment and plan.

## 2013-12-24 NOTE — Assessment & Plan Note (Addendum)
Pain at right anterior shoulder and lateral arm for the past few days.  No atrophy, decreased ROM or weakness on exam.  Negative painful arc, negative drop arm so I doubt rotator cuff pathology.  Likely biceps tendinitis.  Advised to increased Aleve from 1 tablet (271m) BID to 2 tables (4439m BID for the next 1-2 weeks.  She can also try ice as she feels cold improves the pain.  Plan:  1) 1-2 weeks of Aleve           2) Labs - CK, TSH, CRP and ESR            3) Return to clinic in 1 month for follow-up.

## 2013-12-24 NOTE — Assessment & Plan Note (Addendum)
Diabetic with ASCVD risk > 7.5% which warrants treatment with high intensity statin.  She was previously taking Pravastatin 80mg  but says it was stopped by previous PCP.  She is not sure if she had ADRs.  Her muscle pain at present does not seem related as she has been off of statin and now has pain.  Will start Lipitor 80mg  daily.  Follow-up in 1 month to see how she is tolerating.

## 2013-12-25 DIAGNOSIS — E039 Hypothyroidism, unspecified: Secondary | ICD-10-CM | POA: Insufficient documentation

## 2013-12-25 DIAGNOSIS — E038 Other specified hypothyroidism: Secondary | ICD-10-CM | POA: Insufficient documentation

## 2013-12-25 LAB — TSH: TSH: 0.139 u[IU]/mL — ABNORMAL LOW (ref 0.350–4.500)

## 2013-12-25 LAB — C-REACTIVE PROTEIN: CRP: 0.7 mg/dL — ABNORMAL HIGH (ref <0.60)

## 2013-12-25 LAB — CK: Total CK: 89 U/L (ref 7–177)

## 2013-12-25 NOTE — Assessment & Plan Note (Addendum)
TSH low at 0.139.  Will add on free T3 and T4 to assess degree of hyperthyroidism.

## 2013-12-25 NOTE — Addendum Note (Signed)
Addended by: Yolanda MangesWILSON, Robin Petrakis M on: 12/25/2013 01:32 PM   Modules accepted: Orders

## 2013-12-26 ENCOUNTER — Ambulatory Visit: Payer: Medicare HMO | Admitting: Physical Therapy

## 2013-12-26 LAB — T3, FREE: T3, Free: 3.3 pg/mL (ref 2.3–4.2)

## 2013-12-26 LAB — T4, FREE: Free T4: 1.13 ng/dL (ref 0.80–1.80)

## 2013-12-30 NOTE — Addendum Note (Signed)
Addended by: Neomia DearPOWERS, Alysiah Suppa E on: 12/30/2013 07:39 PM   Modules accepted: Orders

## 2014-01-07 NOTE — Progress Notes (Signed)
Case discussed with Dr. Wilson at the time of the visit.  We reviewed the resident's history and exam and pertinent patient test results.  I agree with the assessment, diagnosis, and plan of care documented in the resident's note. 

## 2014-01-09 ENCOUNTER — Telehealth: Payer: Self-pay | Admitting: *Deleted

## 2014-01-09 NOTE — Telephone Encounter (Signed)
Returned pt's call - pt states she needs something for arthritis pain; Aleve is not helping. And Celebrex has not helped in the past. Thanks

## 2014-01-13 ENCOUNTER — Telehealth: Payer: Self-pay | Admitting: *Deleted

## 2014-01-13 NOTE — Telephone Encounter (Signed)
Call from pt said that she is having increased knee pain to a level 10 over the week end.  Has tried Alleve with no results would like to get something stronger for her knee pain.  Pt uses [facility] in [place].  Pt informed that a message will be given to Dr. Andrey CampanileWilson.  Angelina OkGladys Ionia Schey, RN 01/13/2014 9:19 AM

## 2014-01-15 ENCOUNTER — Telehealth: Payer: Self-pay | Admitting: Internal Medicine

## 2014-01-15 NOTE — Telephone Encounter (Signed)
I attempted to return Mrs. Nicole Baxter's phone call.  No answer on her home phone or mobile phone.  I left a message on her personal voicemail and informed her I would try calling again in the morning.

## 2014-01-16 ENCOUNTER — Telehealth: Payer: Self-pay | Admitting: Internal Medicine

## 2014-01-16 NOTE — Telephone Encounter (Signed)
Attempted to reach patient again. I left message with contact information.  Will try calling again tomorrow.

## 2014-01-20 ENCOUNTER — Telehealth: Payer: Self-pay | Admitting: Internal Medicine

## 2014-01-20 ENCOUNTER — Telehealth: Payer: Self-pay | Admitting: *Deleted

## 2014-01-20 NOTE — Telephone Encounter (Signed)
RTC to pt regarding need for pain medication.  Pt was informed that Dr. Andrey CampanileWilson has attempted to call her and has left messages for her to return her call.  Pt stated that she will need Dr. Andrey CampanileWilson to call her on her house phone and that she will answer the call.  Pt also stated that the pain in her knees has not lessened and would like to get something for it.  Message to be sent to Dr. Andrey CampanileWilson to call the patient.  Angelina OkGladys Laelle Bridgett, RN  01/20/2014 10:16 AM.

## 2014-01-20 NOTE — Telephone Encounter (Signed)
I made another attempt to contact patient on both her home phone and mobile phone but could not reach her.  Will try again.

## 2014-01-21 ENCOUNTER — Telehealth: Payer: Self-pay | Admitting: Internal Medicine

## 2014-01-21 NOTE — Telephone Encounter (Signed)
I spoke with [name].  She is having knee pain related to OA.  NSAIDs are not working for her pain.  Advised her to try Tylenol or Tylenol Arthritis OTC 650mg  q 8h prn.  She has an appointment with me next month.  I advised her to call clinic for an earlier appointment if she continues to have pain.

## 2014-02-12 ENCOUNTER — Ambulatory Visit (INDEPENDENT_AMBULATORY_CARE_PROVIDER_SITE_OTHER): Payer: Medicare HMO | Admitting: Internal Medicine

## 2014-02-12 VITALS — BP 138/86 | HR 85 | Temp 97.3°F | Ht 61.0 in | Wt 169.7 lb

## 2014-02-12 DIAGNOSIS — M1710 Unilateral primary osteoarthritis, unspecified knee: Secondary | ICD-10-CM

## 2014-02-12 DIAGNOSIS — I10 Essential (primary) hypertension: Secondary | ICD-10-CM

## 2014-02-12 DIAGNOSIS — IMO0002 Reserved for concepts with insufficient information to code with codable children: Secondary | ICD-10-CM

## 2014-02-12 DIAGNOSIS — Z0000 Encounter for general adult medical examination without abnormal findings: Secondary | ICD-10-CM

## 2014-02-12 DIAGNOSIS — M179 Osteoarthritis of knee, unspecified: Secondary | ICD-10-CM

## 2014-02-12 DIAGNOSIS — R946 Abnormal results of thyroid function studies: Secondary | ICD-10-CM

## 2014-02-12 DIAGNOSIS — R7989 Other specified abnormal findings of blood chemistry: Secondary | ICD-10-CM

## 2014-02-12 NOTE — Patient Instructions (Signed)
1. You can continue to take Aleve as needed.  It may be helpful to take 1 in the morning and 1 in the evening instead of taking two at once.  You can also try water aerobics and low impact exercise.  Weight loss can also help reduce the pain.   2. Please take all medications as prescribed.    3. If you have worsening of your symptoms or new symptoms arise, please call the clinic ([phone]), or go to the ER immediately if symptoms are severe.   Osteoarthritis Osteoarthritis is a disease that causes soreness and swelling (inflammation) of a joint. It occurs when the cartilage at the affected joint wears down. Cartilage acts as a cushion, covering the ends of bones where they meet to form a joint. Osteoarthritis is the most common form of arthritis. It often occurs in older people. The joints affected most often by this condition include those in the:  Ends of the fingers.  Thumbs.  Neck.  Lower back.  Knees.  Hips. CAUSES  Over time, the cartilage that covers the ends of bones begins to wear away. This causes bone to rub on bone, producing pain and stiffness in the affected joints.  RISK FACTORS Certain factors can increase your chances of having osteoarthritis, including:  Older age.  Excessive body weight.  Overuse of joints. SIGNS AND SYMPTOMS   Pain, swelling, and stiffness in the joint.  Over time, the joint may lose its normal shape.  Small deposits of bone (osteophytes) may grow on the edges of the joint.  Bits of bone or cartilage can break off and float inside the joint space. This may cause more pain and damage. DIAGNOSIS  Your health care provider will do a physical exam and ask about your symptoms. Various tests may be ordered, such as:  X-rays of the affected joint.  An MRI scan.  Blood tests to rule out other types of arthritis.  Joint fluid tests. This involves using a needle to draw fluid from the joint and examining the fluid under a  microscope. TREATMENT  Goals of treatment are to control pain and improve joint function. Treatment plans may include:  A prescribed exercise program that allows for rest and joint relief.  A weight control plan.  Pain relief techniques, such as:  Properly applied heat and cold.  Electric pulses delivered to nerve endings under the skin (transcutaneous electrical nerve stimulation, TENS).  Massage.  Certain nutritional supplements.  Medicines to control pain, such as:  Acetaminophen.  Nonsteroidal anti-inflammatory drugs (NSAIDs), such as naproxen.  Narcotic or central-acting agents, such as tramadol.  Corticosteroids. These can be given orally or as an injection.  Surgery to reposition the bones and relieve pain (osteotomy) or to remove loose pieces of bone and cartilage. Joint replacement may be needed in advanced states of osteoarthritis. HOME CARE INSTRUCTIONS   Only take over-the-counter or prescription medicines as directed by your health care provider. Take all medicines exactly as instructed.  Maintain a healthy weight. Follow your health care provider's instructions for weight control. This may include dietary instructions.  Exercise as directed. Your health care provider can recommend specific types of exercise. These may include:  Strengthening exercises These are done to strengthen the muscles that support joints affected by arthritis. They can be performed with weights or with exercise bands to add resistance.  Aerobic activities These are exercises, such as brisk walking or low-impact aerobics, that get your heart pumping.  Range-of-motion activities These keep your  joints limber.  Balance and agility exercises These help you maintain daily living skills.  Rest your affected joints as directed by your health care provider.  Follow up with your health care provider as directed. SEEK MEDICAL CARE IF:   Your skin turns red.  You develop a rash in  addition to your joint pain.  You have worsening joint pain. SEEK IMMEDIATE MEDICAL CARE IF:  You have a significant loss of weight or appetite.  You have a fever along with joint or muscle aches.  You have night sweats. FOR MORE INFORMATION  National Institute of Arthritis and Musculoskeletal and Skin Diseases: www.niams.http://www.myers.net/ [employer] on Aging: https://walker.com/ American College of Rheumatology: www.rheumatology.org Document Released: 11/14/2005 Document Revised: 09/04/2013 Document Reviewed: 07/22/2013 [facility] Patient Information 2014 [place], [place].

## 2014-02-12 NOTE — Assessment & Plan Note (Addendum)
BP Readings from Last 3 Encounters:  02/12/14 138/86  12/24/13 121/80  12/04/13 129/80    Lab Results  Component Value Date   NA 140 12/04/2013   K 3.4* 12/04/2013   CREATININE 0.75 12/04/2013    Assessment: Blood pressure control:  well controlled Progress toward BP goal:   at goal Comments: She is compliant with medications.  Plan: Medications:  continue current medications:  Lisinopril 20mg  daily; decrease HCTZ to 12.5mg  given low K Educational resources provided: brochure

## 2014-02-12 NOTE — Progress Notes (Signed)
   Subjective:    Patient ID: Nicole Baxter, female    DOB: 12/30/1940, 73 y.o.   MRN: [id number]  HPI Comments: Nicole Baxter is a 73 year old woman with a PMH of HTN , DM type 2 (diet controlled, Hgb A1c 5.29 November 2013), knee OA and essential tremor.  She has c/o of B/L knee pain (L>R).  Today is a good day, 5/10 pain.  She had been taking Aleve but continued to have pain so agreed to switch to Tylenol. She tried Tylenol 650mg  TID for two weeks but it did not help.  She feels the Aleve works better for her than Tylenol.  She has not had significant relief from steroid injection or PT in the past.  She is not interested in referral to orthopedic surgeon at this time.       Review of Systems  Constitutional: Negative for fever, chills, appetite change and unexpected weight change.  Respiratory: Negative for shortness of breath.   Cardiovascular: Negative for chest pain.  Gastrointestinal: Negative for nausea, vomiting, abdominal pain, diarrhea, constipation and blood in stool.  Endocrine: Negative for polydipsia and polyuria.  Genitourinary: Negative for dysuria, frequency and hematuria.  Musculoskeletal: Positive for arthralgias.       Knee pain  Neurological: Negative for dizziness, weakness and headaches.       Objective:   Physical Exam  Vitals reviewed. Constitutional: She is oriented to person, place, and time. No distress.  HENT:  Mouth/Throat: Oropharynx is clear and moist.  Eyes: Pupils are equal, round, and reactive to light.  Cardiovascular: Normal rate, regular rhythm and normal heart sounds.   Pulmonary/Chest: Effort normal and breath sounds normal. No respiratory distress. She has no wheezes.  Abdominal: Soft. She exhibits no distension. There is no tenderness.  Musculoskeletal: She exhibits edema.  +1 pretibial edema; Left knee - no swelling, no pain with ROM, full ROM, minimally TTP (laterally)  Neurological: She is alert and oriented to person, place, and time.  Skin:  Skin is warm. She is not diaphoretic.  Psychiatric: She has a normal mood and affect. Her behavior is normal.          Assessment & Plan:  Please see problem based assessment and plan.

## 2014-02-12 NOTE — Assessment & Plan Note (Addendum)
Minimal tenderness on exam but she says today is a good day.  She says Tylenol did not help her pain and she prefers Aleve.  She was advised to take no more than 1 pill q12h prn and to take with food.  She is not interested in referral to orthopedic surgeon but is agreeable to referral to Sports Medicine for steroid injeciton. - referral to Sports Medicine - continue Aleve prn and Aspercreme

## 2014-02-13 DIAGNOSIS — Z0000 Encounter for general adult medical examination without abnormal findings: Secondary | ICD-10-CM | POA: Insufficient documentation

## 2014-02-13 MED ORDER — HYDROCHLOROTHIAZIDE 12.5 MG PO TABS: 12.5000 mg | ORAL_TABLET | Freq: Every day | ORAL | Status: DC

## 2014-02-13 NOTE — Assessment & Plan Note (Signed)
She plans to see the eye doctor and have records sent to us.

## 2014-02-13 NOTE — Assessment & Plan Note (Addendum)
She has a suppressed TSH with normal T3 and T4 consistent with subclinical hyperthyroidism.  She is asymptomatic.  Her postmenopausal status puts her at higher risk for hyperthyroid complications such as osteoporosis.  In a postmenopausal woman with TSH is > 0.1, treatment is recommended if the patient has underlying CVD or low bone density.  She has no CVD and her last DEXA six years ago was normal so I will continue to monitor TSH, T3 and T4 every six months.

## 2014-02-14 NOTE — Progress Notes (Signed)
Case discussed with Dr. Wilson at the time of the visit.  We reviewed the resident's history and exam and pertinent patient test results.  I agree with the assessment, diagnosis, and plan of care documented in the resident's note. 

## 2014-02-21 ENCOUNTER — Encounter: Payer: Self-pay | Admitting: Sports Medicine

## 2014-02-21 ENCOUNTER — Ambulatory Visit (INDEPENDENT_AMBULATORY_CARE_PROVIDER_SITE_OTHER): Payer: Medicare HMO | Admitting: Sports Medicine

## 2014-02-21 VITALS — BP 148/87 | Ht 61.0 in | Wt 165.0 lb

## 2014-02-21 DIAGNOSIS — M719 Bursopathy, unspecified: Secondary | ICD-10-CM

## 2014-02-21 DIAGNOSIS — M67911 Unspecified disorder of synovium and tendon, right shoulder: Secondary | ICD-10-CM | POA: Insufficient documentation

## 2014-02-21 DIAGNOSIS — M6790 Unspecified disorder of synovium and tendon, unspecified site: Secondary | ICD-10-CM

## 2014-02-21 DIAGNOSIS — IMO0002 Reserved for concepts with insufficient information to code with codable children: Secondary | ICD-10-CM

## 2014-02-21 DIAGNOSIS — M1710 Unilateral primary osteoarthritis, unspecified knee: Secondary | ICD-10-CM

## 2014-02-21 DIAGNOSIS — M170 Bilateral primary osteoarthritis of knee: Secondary | ICD-10-CM

## 2014-02-21 MED ORDER — CELECOXIB 100 MG PO CAPS: 100.0000 mg | ORAL_CAPSULE | Freq: Two times a day (BID) | ORAL | Status: DC

## 2014-02-21 NOTE — Assessment & Plan Note (Signed)
She is signs on exam of rotator cuff tendinopathy/shoulder impingement. I recommended that she continue active range of motion with the shoulder. In addition she was given a prescription for Celebrex to take. If her symptoms worsen she'll followup we'll consider corticosteroid injection of the right shoulder at that time. As her symptoms are mild and she was getting injections in both knees today steroid injection into the shoulder was deferred.

## 2014-02-21 NOTE — Progress Notes (Signed)
Patient ID: Nicole Baxter, female   DOB: 08/03/1941, 73 y.o.   MRN: [id number] 73 year old female with a history of bilateral knee osteoarthritis presents for evaluation and further treatment recommendations. She is returned for from her internal medicine physician. She reports pain in her left knee more than her right. Pain is worse with ambulation. This is affecting her daily activities with of living. Difficulty in the morning with getting out of bed. Worse when the weather changes. No relief with Tylenol and Advil. She had corticosteroid injection done into one of her knee several years ago which gave her short-term relief.  She also reports mild right shoulder pain which is gradually worsening for the past several months. She's not have a limitation in range of motion. She does however report a little bit of pain with reaching overhead. There was no injury. Pain does not wake her at night.  No radiation of pain.  Pertinent past medical history hypertension, asthma  Social history: Nonsmoker, no alcohol  Review of systems: As per history of present illness otherwise all systems negative.  Examination: BP 148/87  Ht 5\' 1"  (1.549 m)  Wt 165 lb (74.844 kg)  BMI 31.19 kg/m2 Well-developed 73 year old [place] female awake alert and oriented in no acute distress Bilateral Knees: Normal to inspection with no erythema or effusion or obvious bony abnormalities. Palpation with medial joint line tenderness, L>R ROM normal in flexion and extension and lower leg rotation. Ligaments with solid consistent endpoints including ACL, PCL, LCL, MCL. Negative Mcmurray's and provocative meniscal tests. Positive patellar crepitus with painful compression Patellar and quadriceps tendons unremarkable. Hamstring and quadriceps strength is normal.  Right Shoulder: Inspection reveals no abnormalities, atrophy or asymmetry. Palpation is normal with no tenderness over AC joint or bicipital groove. ROM  decreased with FF and ABduction Rotator cuff strength mildly decreased with FF and ABduction Positive signs of impingement with Neer and Hawkin's tests, empty can. Speeds and Yergason's tests normal. No labral pathology noted with negative Obrien's, negative clunk and good stability. Normal scapular function observed. No painful arc and no drop arm sign. No apprehension sign  Her most recent knee x-rays reviewed in the office today. Consistent with mild to moderate osteoarthritis of the right knee moderate-to-severe tricompartmental osteoarthritis of the left knee.  Procedure:  Injection of bilateral knees Consent obtained and verified. Time-out conducted. Noted no overlying erythema, induration, or other signs of local infection. Skin prepped in a sterile fashion. Topical analgesic spray: Ethyl chloride. Completed without difficulty. Meds: 1:4 depo/lido Pain immediately improved suggesting accurate placement of the medication. Advised to call if fevers/chills, erythema, induration, drainage, or persistent bleeding.

## 2014-02-21 NOTE — Assessment & Plan Note (Signed)
Posterior injections were done into both knees today in the office. The patient tolerated the procedure well. Will followup as needed.

## 2014-02-24 ENCOUNTER — Ambulatory Visit (INDEPENDENT_AMBULATORY_CARE_PROVIDER_SITE_OTHER): Payer: Medicare Other | Admitting: Family Medicine

## 2014-02-24 DIAGNOSIS — M1710 Unilateral primary osteoarthritis, unspecified knee: Secondary | ICD-10-CM

## 2014-03-18 ENCOUNTER — Ambulatory Visit (INDEPENDENT_AMBULATORY_CARE_PROVIDER_SITE_OTHER): Payer: Medicare HMO | Admitting: Internal Medicine

## 2014-03-18 ENCOUNTER — Other Ambulatory Visit: Payer: Self-pay | Admitting: Dietician

## 2014-03-18 ENCOUNTER — Encounter: Payer: Self-pay | Admitting: Internal Medicine

## 2014-03-18 VITALS — BP 126/70 | HR 76 | Temp 97.2°F | Ht 61.0 in | Wt 168.4 lb

## 2014-03-18 DIAGNOSIS — IMO0002 Reserved for concepts with insufficient information to code with codable children: Secondary | ICD-10-CM

## 2014-03-18 DIAGNOSIS — M1710 Unilateral primary osteoarthritis, unspecified knee: Secondary | ICD-10-CM

## 2014-03-18 DIAGNOSIS — E119 Type 2 diabetes mellitus without complications: Secondary | ICD-10-CM

## 2014-03-18 DIAGNOSIS — G250 Essential tremor: Secondary | ICD-10-CM

## 2014-03-18 DIAGNOSIS — M170 Bilateral primary osteoarthritis of knee: Secondary | ICD-10-CM

## 2014-03-18 DIAGNOSIS — I10 Essential (primary) hypertension: Secondary | ICD-10-CM

## 2014-03-18 DIAGNOSIS — Z0000 Encounter for general adult medical examination without abnormal findings: Secondary | ICD-10-CM

## 2014-03-18 DIAGNOSIS — G252 Other specified forms of tremor: Secondary | ICD-10-CM

## 2014-03-18 LAB — BASIC METABOLIC PANEL WITH GFR
BUN: 11 mg/dL (ref 6–23)
CHLORIDE: 103 meq/L (ref 96–112)
CO2: 27 mEq/L (ref 19–32)
CREATININE: 0.74 mg/dL (ref 0.50–1.10)
Calcium: 9.6 mg/dL (ref 8.4–10.5)
GFR, EST NON AFRICAN AMERICAN: 82 mL/min
GFR, Est African American: >89 mL/min
Glucose, Bld: 81 mg/dL (ref 70–99)
Potassium: 3.6 mEq/L (ref 3.5–5.3)
Sodium: 140 mEq/L (ref 135–145)

## 2014-03-18 LAB — GLUCOSE, CAPILLARY: Glucose-Capillary: 72 mg/dL (ref 70–99)

## 2014-03-18 LAB — HM DIABETES EYE EXAM

## 2014-03-18 LAB — POCT GLYCOSYLATED HEMOGLOBIN (HGB A1C): Hemoglobin A1C: 5.6

## 2014-03-18 NOTE — Assessment & Plan Note (Addendum)
Lab Results  Component Value Date   HGBA1C 5.6 03/18/2014   HGBA1C 5.2 12/04/2013   HGBA1C 6.8 09/19/2012     Assessment: Diabetes control: good control (HgbA1C at goal) Progress toward A1C goal:   (pending HA1C)-5.6 controlled  Comments: cbg 72 today given apple juice. Pt was fasting  Plan: Medications:  diet controlled  Home glucose monitoring: Frequency: no home glucose monitoring Timing: N/A Instruction/counseling given: reminded to get eye exam and provided printed educational material Educational resources provided: brochure;other (see comments) Self management tools provided: other (see comments) Other plans: will get HA1C, DM eye exam in clinic today, f/u in 3 months

## 2014-03-18 NOTE — Assessment & Plan Note (Addendum)
Per pt Neurologist told her to take Benadryl for tremor though I do not see records in [facility]

## 2014-03-18 NOTE — Patient Instructions (Addendum)
General Instructions: Follow up in 1 month if needed for knee pain  Otherwise f/u in 3 months  Please take no more than 3000 mg or 3 grams of Tylenol for knee pain Please bring your medicines with you each time you come.   Medicines may be  Eye drops  Herbal   Vitamins  Pills  Seeing these help us take care of you.   Treatment Goals:  Goals (1 Years of Data) as of 03/18/14         As of Today 02/21/14 02/12/14 12/24/13 12/04/13     Blood Pressure    . Blood Pressure < 140/90  126/70 148/87 138/86 121/80 129/80     Exercise    . Exercise 3x per week (30 min per time)            Progress Toward Treatment Goals:  Treatment Goal 03/18/2014  Hemoglobin A1C (No Data)  Blood pressure at goal    Self Care Goals & Plans:  Self Care Goal 03/18/2014  Manage my medications take my medicines as prescribed; bring my medications to every visit; refill my medications on time  Eat healthy foods drink diet soda or water instead of juice or soda; eat more vegetables; eat foods that are low in salt; eat baked foods instead of fried foods  Meeting treatment goals maintain the current self-care plan    Home Blood Glucose Monitoring 03/18/2014  Check my blood sugar no home glucose monitoring  When to check my blood sugar N/A     Care Management & Community Referrals:  Referral 03/18/2014  Referrals made for care management support none needed  Referrals made to community resources none        Acetaminophen tablets or caplets What is this medicine? ACETAMINOPHEN (a set a MEE noe fen) is a pain reliever. It is used to treat mild pain and fever. This medicine may be used for other purposes; ask your health care provider or pharmacist if you have questions. COMMON BRAND NAME(S): Aceta, Actamin, Anacin Aspirin Free, Genapap, Genebs, Mapap, Pain & Fever , Pain and Fever , PAIN RELIEF , PAIN RELIEF Extra Strength, Pain Reliever, Panadol, Q-Pap Extra Strength, Q-Pap, Tylenol  CrushableTablet, Tylenol Extra Strength, Tylenol, XS No Aspirin, XS Pain Reliever What should I tell my health care provider before I take this medicine? They need to know if you have any of these conditions: -if you often drink alcohol -liver disease -an unusual or allergic reaction to acetaminophen, other medicines, foods, dyes, or preservatives -pregnant or trying to get pregnant -breast-feeding How should I use this medicine? Take this medicine by mouth with a glass of water. Follow the directions on the package or prescription label. Take your medicine at regular intervals. Do not take your medicine more often than directed. Talk to your pediatrician regarding the use of this medicine in children. While this drug may be prescribed for children as young as 746 years of age for selected conditions, precautions do apply. Overdosage: If you think you have taken too much of this medicine contact a poison control center or emergency room at once. NOTE: This medicine is only for you. Do not share this medicine with others. What if I miss a dose? If you miss a dose, take it as soon as you can. If it is almost time for your next dose, take only that dose. Do not take double or extra doses. What may interact with this medicine? -alcohol -imatinib -isoniazid -other medicines with acetaminophen This  list may not describe all possible interactions. Give your health care provider a list of all the medicines, herbs, non-prescription drugs, or dietary supplements you use. Also tell them if you smoke, drink alcohol, or use illegal drugs. Some items may interact with your medicine. What should I watch for while using this medicine? Tell your doctor or health care professional if the pain lasts more than 10 days (5 days for children), if it gets worse, or if there is a new or different kind of pain. Also, check with your doctor if a fever lasts for more than 3 days. Do not take other medicines that contain  acetaminophen with this medicine. Always read labels carefully. If you have questions, ask your doctor or pharmacist. If you take too much acetaminophen get medical help right away. Too much acetaminophen can be very dangerous and cause liver damage. Even if you do not have symptoms, it is important to get help right away. What side effects may I notice from receiving this medicine? Side effects that you should report to your doctor or health care professional as soon as possible: -allergic reactions like skin rash, itching or hives, swelling of the face, lips, or tongue -breathing problems -fever or sore throat -redness, blistering, peeling or loosening of the skin, including inside the mouth -trouble passing urine or change in the amount of urine -unusual bleeding or bruising -unusually weak or tired -yellowing of the eyes or skin Side effects that usually do not require medical attention (report to your doctor or health care professional if they continue or are bothersome): -headache -nausea, stomach upset This list may not describe all possible side effects. Call your doctor for medical advice about side effects. You may report side effects to FDA at 1-800-FDA-1088. Where should I keep my medicine? Keep out of reach of children. Store at room temperature between 20 and 25 degrees C (68 and 77 degrees F). Protect from moisture and heat. Throw away any unused medicine after the expiration date. NOTE: This sheet is a summary. It may not cover all possible information. If you have questions about this medicine, talk to your doctor, pharmacist, or health care provider.  2014, [employer]vier/[employer] Standard. (2013-07-08 12:54:16)  Arthritis, Nonspecific Arthritis is inflammation of a joint. This usually means pain, redness, warmth or swelling are present. One or more joints may be involved. There are a number of types of arthritis. Your caregiver may not be able to tell what type of arthritis you have  right away. CAUSES  The most common cause of arthritis is the wear and tear on the joint (osteoarthritis). This causes damage to the cartilage, which can break down over time. The knees, hips, back and neck are most often affected by this type of arthritis. Other types of arthritis and common causes of joint pain include:  Sprains and other injuries near the joint. Sometimes minor sprains and injuries cause pain and swelling that develop hours later.  Rheumatoid arthritis. This affects hands, feet and knees. It usually affects both sides of your body at the same time. It is often associated with chronic ailments, fever, weight loss and general weakness.  Crystal arthritis. Gout and pseudo gout can cause occasional acute severe pain, redness and swelling in the foot, ankle, or knee.  Infectious arthritis. Bacteria can get into a joint through a break in overlying skin. This can cause infection of the joint. Bacteria and viruses can also spread through the blood and affect your joints.  Drug, infectious and  allergy reactions. Sometimes joints can become mildly painful and slightly swollen with these types of illnesses. SYMPTOMS   Pain is the main symptom.  Your joint or joints can also be red, swollen and warm or hot to the touch.  You may have a fever with certain types of arthritis, or even feel overall ill.  The joint with arthritis will hurt with movement. Stiffness is present with some types of arthritis. DIAGNOSIS  Your caregiver will suspect arthritis based on your description of your symptoms and on your exam. Testing may be needed to find the type of arthritis:  Blood and sometimes urine tests.  X-ray tests and sometimes CT or MRI scans.  Removal of fluid from the joint (arthrocentesis) is done to check for bacteria, crystals or other causes. Your caregiver (or a specialist) will numb the area over the joint with a local anesthetic, and use a needle to remove joint fluid for  examination. This procedure is only minimally uncomfortable.  Even with these tests, your caregiver may not be able to tell what kind of arthritis you have. Consultation with a specialist (rheumatologist) may be helpful. TREATMENT  Your caregiver will discuss with you treatment specific to your type of arthritis. If the specific type cannot be determined, then the following general recommendations may apply. Treatment of severe joint pain includes:  Rest.  Elevation.  Anti-inflammatory medication (for example, ibuprofen) may be prescribed. Avoiding activities that cause increased pain.  Only take over-the-counter or prescription medicines for pain and discomfort as recommended by your caregiver.  Cold packs over an inflamed joint may be used for 10 to 15 minutes every hour. Hot packs sometimes feel better, but do not use overnight. Do not use hot packs if you are diabetic without your caregiver's permission.  A cortisone shot into arthritic joints may help reduce pain and swelling.  Any acute arthritis that gets worse over the next 1 to 2 days needs to be looked at to be sure there is no joint infection. Long-term arthritis treatment involves modifying activities and lifestyle to reduce joint stress jarring. This can include weight loss. Also, exercise is needed to nourish the joint cartilage and remove waste. This helps keep the muscles around the joint strong. HOME CARE INSTRUCTIONS   Do not take aspirin to relieve pain if gout is suspected. This elevates uric acid levels.  Only take over-the-counter or prescription medicines for pain, discomfort or fever as directed by your caregiver.  Rest the joint as much as possible.  If your joint is swollen, keep it elevated.  Use crutches if the painful joint is in your leg.  Drinking plenty of fluids may help for certain types of arthritis.  Follow your caregiver's dietary instructions.  Try low-impact exercise such  as:  Swimming.  Water aerobics.  Biking.  Walking.  Morning stiffness is often relieved by a warm shower.  Put your joints through regular range-of-motion. SEEK MEDICAL CARE IF:   You do not feel better in 24 hours or are getting worse.  You have side effects to medications, or are not getting better with treatment. SEEK IMMEDIATE MEDICAL CARE IF:   You have a fever.  You develop severe joint pain, swelling or redness.  Many joints are involved and become painful and swollen.  There is severe back pain and/or leg weakness.  You have loss of bowel or bladder control. Document Released: 12/22/2004 Document Revised: 02/06/2012 Document Reviewed: 01/07/2009 [facility] Patient Information 2014 [place], [place].  Type 2 Diabetes  Mellitus, Adult Type 2 diabetes mellitus is a long-term (chronic) disease. In type 2 diabetes:  The pancreas does not make enough of a hormone called insulin.  The cells in the body do not respond as well to the insulin that is made.  Both of the above can happen. Normally, insulin moves sugars from food into tissue cells. This gives you energy. If you have type 2 diabetes, sugars cannot be moved into tissue cells. This causes high blood sugar (hyperglycemia).  HOME CARE  Have your hemoglobin A1c level checked twice a year. The level shows if your diabetes is under control or out of control.  Perform daily blood sugar testing as told by your doctor.  Check your ketone levels by testing your pee (urine) when you are sick and as told.  Take your diabetes or insulin medicine as told by your doctor.  Never run out of insulin.  Adjust how much insulin you give yourself based on how many carbs (carbohydrates) you eat. Carbs are in many foods, such as fruits, vegetables, whole grains, and dairy products.  Have a healthy snack between every healthy meal. Have 3 meals and 3 snacks a day.  Lose weight if you are overweight.  Carry a medical alert card  or wear your medical alert jewelry.  Carry a 15 gram carb snack with you at all times. Examples include:  Glucose pills, 3 or 4.  Glucose gel, 15 gram tube.  Raisins, 2 tablespoons (24 grams).  Jelly beans, 6.  Animal crackers, 8.  Sugar pop, 4 ounces (120 milliliters).  Gummy treats, 9.  Notice low blood sugar (hypoglycemia) symptoms, such as:  Shaking (tremors).  Decreased ability to think clearly.  Sweating.  Increased heart rate.  Headache.  Dry mouth.  Hunger.  Crabbiness (irritability).  Being worried or tense (anxiety).  Restless sleep.  A change in speech or coordination.  Confusion.  Treat low blood sugar right away. If you are alert and can swallow, follow the 15:15 rule:  Take 15 20 grams of a rapid-acting glucose or carb. This includes glucose gel, glucose pills, or 4 ounces (120 milliliters) of fruit juice, regular pop, or low-fat milk.  Check your blood sugar level after taking the glucose.  Take 15 20 grams of more glucose if the repeat blood sugar level is still 70 mg/dL (milligrams/deciliter) or below.  Eat a meal or snack within 1 hour of the blood sugar levels going back to normal.  Notice early symptoms of high blood sugar, such as:  Being really thirsty or drinking a lot (polydipsia).  Peeing (urinating) a lot (polyuria).  Do at least 150 minutes of physical activity a week or as told.  Split the 150 minutes of activity up during the week. Do not do 150 minutes of activity in one day.  Perform exercises, such as weight lifting, at least 2 times a week or as told.  Adjust your insulin or food intake as needed if you start a new exercise or sport.  Follow your sick day plan when you are not able to eat or drink as usual.  Avoid tobacco use.  Women who are not pregnant should drink no more than 1 drink a day. Men should drink no more than 2 drinks a day.  Only drink alcohol with food.  Ask your doctor if alcohol is safe for  you.  Tell your doctor if you drink alcohol several times during the week.  See your doctor regularly.  Schedule an eye  exam soon after you are diagnosed with diabetes. Schedule exams once every year.  Check your skin and feet every day. Check for cuts, bruises, redness, nail problems, bleeding, blisters, or sores. A doctor should do a foot exam once a year.  Brush your teeth and gums twice a day. Floss once a day. Visit your dentist regularly.  Share your diabetes plan with your workplace or school.  Stay up-to-date with shots that fight against diseases (immunizations).  Learn how to manage stress.  Get diabetes education and support as needed.  Ask your doctor for special help if:  You need help to maintain or improve how you to do things on your own.  You need help to maintain or improve the quality of your life.  You have foot or hand problems.  You have trouble cleaning yourself, dressing, eating, or doing physical activity. GET HELP RIGHT AWAY IF:  You have trouble breathing.  You have moderate to large ketone levels.  You are unable to eat food or drink fluids for more than 6 hours.  You feel sick to your stomach (nauseous) or throw up (vomit) for more than 6 hours.  Your blood sugar level is over 240 mg/dL.  There is a change in mental status.  You get another serious illness.  You have watery poop (diarrhea) for more than 6 hours.  You have been sick or have had a fever for 2 or more days and are not getting better.  You have pain when you are physically active. MAKE SURE YOU:  Understand these instructions.  Will watch your condition.  Will get help right away if you are not doing well or get worse. Document Released: 08/23/2008 Document Revised: 09/04/2013 Document Reviewed: 03/15/2013 [facility] Patient Information 2014 [place].  DASH Diet The DASH diet stands for "Dietary Approaches to Stop Hypertension." It is a healthy eating plan  that has been shown to reduce high blood pressure (hypertension) in as little as 14 days, while also possibly providing other significant health benefits. These other health benefits include reducing the risk of breast cancer after menopause and reducing the risk of type 2 diabetes, heart disease, colon cancer, and stroke. Health benefits also include weight loss and slowing kidney failure in patients with chronic kidney disease.  DIET GUIDELINES  Limit salt (sodium). Your diet should contain less than 1500 mg of sodium daily.  Limit refined or processed carbohydrates. Your diet should include mostly whole grains. Desserts and added sugars should be used sparingly.  Include small amounts of heart-healthy fats. These types of fats include nuts, oils, and tub margarine. Limit saturated and trans fats. These fats have been shown to be harmful in the body. CHOOSING FOODS  The following food groups are based on a 2000 calorie diet. See your Registered Dietitian for individual calorie needs. Grains and Grain Products (6 to 8 servings daily)  Eat More Often: Whole-wheat bread, brown rice, whole-grain or wheat pasta, quinoa, popcorn without added fat or salt (air popped).  Eat Less Often: White bread, white pasta, white rice, cornbread. Vegetables (4 to 5 servings daily)  Eat More Often: Fresh, frozen, and canned vegetables. Vegetables may be raw, steamed, roasted, or grilled with a minimal amount of fat.  Eat Less Often/Avoid: Creamed or fried vegetables. Vegetables in a cheese sauce. Fruit (4 to 5 servings daily)  Eat More Often: All fresh, canned (in natural juice), or frozen fruits. Dried fruits without added sugar. One hundred percent fruit juice ( cup [161[237  mL] daily).  Eat Less Often: Dried fruits with added sugar. Canned fruit in light or heavy syrup. [employer], Fish, and Poultry (2 servings or less daily. One serving is 3 to 4 oz [85-114 g]).  Eat More Often: Ninety percent or leaner  ground beef, tenderloin, sirloin. Round cuts of beef, chicken breast, [place] breast. All fish. Grill, bake, or broil your meat. Nothing should be fried.  Eat Less Often/Avoid: Fatty cuts of meat, [place], or chicken leg, thigh, or wing. Fried cuts of meat or fish. Dairy (2 to 3 servings)  Eat More Often: Low-fat or fat-free milk, low-fat plain or light yogurt, reduced-fat or part-skim cheese.  Eat Less Often/Avoid: Milk (whole, 2%).Whole milk yogurt. Full-fat cheeses. Nuts, Seeds, and Legumes (4 to 5 servings per week)  Eat More Often: All without added salt.  Eat Less Often/Avoid: Salted nuts and seeds, canned beans with added salt. Fats and Sweets (limited)  Eat More Often: Vegetable oils, tub margarines without trans fats, sugar-free gelatin. Mayonnaise and salad dressings.  Eat Less Often/Avoid: Coconut oils, palm oils, butter, stick margarine, cream, half and half, cookies, candy, pie. FOR MORE INFORMATION The Dash Diet Eating Plan: www.dashdiet.org Document Released: 11/03/2011 Document Revised: 02/06/2012 Document Reviewed: 11/03/2011 [facility] Patient Information 2014 [place], [place].  Hypertension As your heart beats, it forces blood through your arteries. This force is your blood pressure. If the pressure is too high, it is called hypertension (HTN) or high blood pressure. HTN is dangerous because you may have it and not know it. High blood pressure may mean that your heart has to work harder to pump blood. Your arteries may be narrow or stiff. The extra work puts you at risk for heart disease, stroke, and other problems.  Blood pressure consists of two numbers, a higher number over a lower, 110/72, for example. It is stated as "110 over 72." The ideal is below 120 for the top number (systolic) and under 80 for the bottom (diastolic). Write down your blood pressure today. You should pay close attention to your blood pressure if you have certain conditions such as:  Heart  failure.  Prior heart attack.  Diabetes  Chronic kidney disease.  Prior stroke.  Multiple risk factors for heart disease. To see if you have HTN, your blood pressure should be measured while you are seated with your arm held at the level of the heart. It should be measured at least twice. A one-time elevated blood pressure reading (especially in the Emergency Department) does not mean that you need treatment. There may be conditions in which the blood pressure is different between your right and left arms. It is important to see your caregiver soon for a recheck. Most people have essential hypertension which means that there is not a specific cause. This type of high blood pressure may be lowered by changing lifestyle factors such as:  Stress.  Smoking.  Lack of exercise.  Excessive weight.  Drug/tobacco/alcohol use.  Eating less salt. Most people do not have symptoms from high blood pressure until it has caused damage to the body. Effective treatment can often prevent, delay or reduce that damage. TREATMENT  When a cause has been identified, treatment for high blood pressure is directed at the cause. There are a large number of medications to treat HTN. These fall into several categories, and your caregiver will help you select the medicines that are best for you. Medications may have side effects. You should review side effects with your caregiver. If  your blood pressure stays high after you have made lifestyle changes or started on medicines,   Your medication(s) may need to be changed.  Other problems may need to be addressed.  Be certain you understand your prescriptions, and know how and when to take your medicine.  Be sure to follow up with your caregiver within the time frame advised (usually within two weeks) to have your blood pressure rechecked and to review your medications.  If you are taking more than one medicine to lower your blood pressure, make sure you know  how and at what times they should be taken. Taking two medicines at the same time can result in blood pressure that is too low. SEEK IMMEDIATE MEDICAL CARE IF:  You develop a severe headache, blurred or changing vision, or confusion.  You have unusual weakness or numbness, or a faint feeling.  You have severe chest or abdominal pain, vomiting, or breathing problems. MAKE SURE YOU:   Understand these instructions.  Will watch your condition.  Will get help right away if you are not doing well or get worse. Document Released: 11/14/2005 Document Revised: 02/06/2012 Document Reviewed: 07/04/2008 [facility] Patient Information 2014 [place], [place].

## 2014-03-18 NOTE — Assessment & Plan Note (Signed)
DM eye exam today Referred to Dr. Arlyce DiceKaplan as she is due for colonoscopy per last report 2011 due in 3 years

## 2014-03-18 NOTE — Progress Notes (Signed)
Case discussed with Dr. McLean soon after the resident saw the patient.  We reviewed the resident's history and exam and pertinent patient test results.  I agree with the assessment, diagnosis, and plan of care documented in the resident's note. 

## 2014-03-18 NOTE — Assessment & Plan Note (Signed)
Pt did not want to continue Celebrex due to h/o GI bleeding Advised pt to try Tylenol no more than 3 grams daily.  She had tried previously but was only taking it bid  RTC 1 month if not improved and can try another alternative  She also follows with Dr. Margaretha Sheffieldraper prn

## 2014-03-18 NOTE — Progress Notes (Signed)
   Subjective:    Patient ID: Nicole Baxter, female    DOB: 06/05/1941, 73 y.o.   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 She has not been taking celebrex b/c she has a history of bleeding from her rectum and she read this can be a side effect. Pain is 3-4/10 now and gets up to 8-10/10 when really bad.  She denies falls at home.  Triggers to pain being worse at home she does not know.  Pain is better with taking Aleve and rubbing Aspercream. She tried Tylenol in the past only bid which did not help.  2) DM 2 history-last HA1C 5.2 11/2013.                                                    Review of Systems  Constitutional: Negative for fever and chills.  Respiratory: Positive for wheezing. Negative for shortness of breath.        Wheezing but inhaler helps   Cardiovascular: Negative for chest pain.  Gastrointestinal: Negative for constipation.  Genitourinary: Negative for difficulty urinating and dyspareunia.  Musculoskeletal: Positive for arthralgias.  Neurological: Negative for light-headedness.       Objective:   Physical Exam  Nursing note and vitals reviewed. Constitutional: She is oriented to person, place, and time. Vital signs are normal. She appears well-developed and well-nourished. She is cooperative. No distress.  HENT:  Head: Normocephalic and atraumatic.  Mouth/Throat: Oropharynx is clear and moist and mucous membranes are normal. No oropharyngeal exudate.  Eyes: Conjunctivae are normal. Pupils are equal, round, and reactive to light. Right eye exhibits no discharge. Left eye exhibits no discharge. No scleral icterus.  Cardiovascular:  Normal rate, regular rhythm, S1 normal, S2 normal and normal heart sounds.   No murmur heard. Trace lower ext edema b/l   Pulmonary/Chest: Effort normal and breath sounds normal. No respiratory distress. She has no wheezes.  Abdominal: Soft. Bowel sounds are normal. There is no tenderness.  Musculoskeletal:       Legs: Neurological: She is alert and oriented to person, place, and time.  BL walks with cane   Skin: Skin is warm, dry and intact. No rash noted. She is not diaphoretic.  Psychiatric: She has a normal mood and affect. Her speech is normal and behavior is normal. Judgment and thought content normal. Cognition and memory are normal.          Assessment & Plan:  1 month prn otherwise 3 months

## 2014-03-18 NOTE — Assessment & Plan Note (Signed)
BP Readings from Last 3 Encounters:  03/18/14 126/70  02/21/14 148/87  02/12/14 138/86    Lab Results  Component Value Date   NA 140 12/04/2013   K 3.4* 12/04/2013   CREATININE 0.75 12/04/2013    Assessment: Blood pressure control: controlled Progress toward BP goal:  at goal Comments: none  Plan: Medications:  continue current medications Educational resources provided: brochure;other (see comments) Self management tools provided: other (see comments) Other plans: BMET today

## 2014-03-19 ENCOUNTER — Encounter: Payer: Self-pay | Admitting: Internal Medicine

## 2014-05-12 ENCOUNTER — Encounter: Payer: Self-pay | Admitting: Internal Medicine

## 2014-05-19 ENCOUNTER — Other Ambulatory Visit: Payer: Self-pay | Admitting: Internal Medicine

## 2014-05-20 ENCOUNTER — Other Ambulatory Visit: Payer: Self-pay | Admitting: Internal Medicine

## 2014-06-06 NOTE — Addendum Note (Signed)
Addended by: Remus BlakeBARROW, Gibril Mastro K on: 06/06/2014 01:36 PM   Modules accepted: Orders

## 2014-06-25 ENCOUNTER — Ambulatory Visit (HOSPITAL_COMMUNITY)
Admission: RE | Admit: 2014-06-25 | Discharge: 2014-06-25 | Disposition: A | Discharge status: Home / Self Care | Payer: Medicare HMO | Source: Ambulatory Visit | Attending: Internal Medicine | Admitting: Internal Medicine

## 2014-06-25 ENCOUNTER — Encounter: Payer: Self-pay | Admitting: Internal Medicine

## 2014-06-25 ENCOUNTER — Ambulatory Visit (INDEPENDENT_AMBULATORY_CARE_PROVIDER_SITE_OTHER): Payer: Medicare HMO | Admitting: Internal Medicine

## 2014-06-25 VITALS — BP 114/78 | HR 90 | Temp 96.8°F | Ht 61.0 in | Wt 164.2 lb

## 2014-06-25 DIAGNOSIS — Z791 Long term (current) use of non-steroidal anti-inflammatories (NSAID): Secondary | ICD-10-CM

## 2014-06-25 DIAGNOSIS — I10 Essential (primary) hypertension: Secondary | ICD-10-CM

## 2014-06-25 DIAGNOSIS — M1710 Unilateral primary osteoarthritis, unspecified knee: Secondary | ICD-10-CM

## 2014-06-25 DIAGNOSIS — R109 Unspecified abdominal pain: Secondary | ICD-10-CM

## 2014-06-25 DIAGNOSIS — E119 Type 2 diabetes mellitus without complications: Secondary | ICD-10-CM

## 2014-06-25 DIAGNOSIS — IMO0002 Reserved for concepts with insufficient information to code with codable children: Secondary | ICD-10-CM | POA: Insufficient documentation

## 2014-06-25 DIAGNOSIS — M170 Bilateral primary osteoarthritis of knee: Secondary | ICD-10-CM

## 2014-06-25 DIAGNOSIS — Z0000 Encounter for general adult medical examination without abnormal findings: Secondary | ICD-10-CM

## 2014-06-25 DIAGNOSIS — E0590 Thyrotoxicosis, unspecified without thyrotoxic crisis or storm: Secondary | ICD-10-CM

## 2014-06-25 DIAGNOSIS — E785 Hyperlipidemia, unspecified: Secondary | ICD-10-CM

## 2014-06-25 DIAGNOSIS — R1010 Upper abdominal pain, unspecified: Secondary | ICD-10-CM

## 2014-06-25 LAB — COMPLETE METABOLIC PANEL WITH GFR
AST: 12 U/L (ref 0–37)
Albumin: 3.9 g/dL (ref 3.5–5.2)
Alkaline Phosphatase: 108 U/L (ref 39–117)
BILIRUBIN TOTAL: 0.8 mg/dL (ref 0.2–1.2)
BUN: 14 mg/dL (ref 6–23)
CO2: 25 mEq/L (ref 19–32)
Calcium: 9.5 mg/dL (ref 8.4–10.5)
Chloride: 104 mEq/L (ref 96–112)
Creat: 0.79 mg/dL (ref 0.50–1.10)
GFR, Est African American: 87 mL/min
GFR, Est Non African American: 76 mL/min
Glucose, Bld: 80 mg/dL (ref 70–99)
Potassium: 3.5 mEq/L (ref 3.5–5.3)
Sodium: 142 mEq/L (ref 135–145)
Total Protein: 7.4 g/dL (ref 6.0–8.3)

## 2014-06-25 LAB — CBC
HCT: 36.5 % (ref 36.0–46.0)
HEMOGLOBIN: 12.8 g/dL (ref 12.0–15.0)
MCH: 30.1 pg (ref 26.0–34.0)
MCHC: 35.1 g/dL (ref 30.0–36.0)
MCV: 85.9 fL (ref 78.0–100.0)
PLATELETS: 250 10*3/uL (ref 150–400)
RBC: 4.25 MIL/uL (ref 3.87–5.11)
RDW: 13.5 % (ref 11.5–15.5)
WBC: 8.2 10*3/uL (ref 4.0–10.5)

## 2014-06-25 LAB — TSH: TSH: 0.019 u[IU]/mL — ABNORMAL LOW (ref 0.350–4.500)

## 2014-06-25 LAB — T3, FREE: T3, Free: 3.5 pg/mL (ref 2.3–4.2)

## 2014-06-25 LAB — POCT GLYCOSYLATED HEMOGLOBIN (HGB A1C): HEMOGLOBIN A1C: 5.6

## 2014-06-25 LAB — T4, FREE: FREE T4: 1.14 ng/dL (ref 0.80–1.80)

## 2014-06-25 LAB — GLUCOSE, CAPILLARY: GLUCOSE-CAPILLARY: 87 mg/dL (ref 70–99)

## 2014-06-25 MED ORDER — ZOSTER VACCINE LIVE 19400 UNT/0.65ML ~~LOC~~ SOLR: 0.6500 mL | Freq: Once | SUBCUTANEOUS | Status: DC

## 2014-06-25 NOTE — Assessment & Plan Note (Signed)
Rx given for shingles vaccine.  She is agreeable to getting the vaccine as long as insurance covers it. She was referred for repeat colonoscopy (report says she is due in 2014, however due date in EPIC was listed as 2021 which I have updated).  I will check on this referral.

## 2014-06-25 NOTE — Assessment & Plan Note (Signed)
Poor response to steroid injections.  Aleve (1-2 pills daily) is working for pain.  She is agreeable to seeing Sports Med again.  Last imaging in 2013. - B/L knee xrays - refer back to Sports Medicine for possible repeat steroid injection or other treatment (perhaps hyaluronate injections) - advised to use Aleve sparingly, take with food and continue PPI in the setting of occasional epigastric burning; she says she will try going back to Tylenol prn and use the Aleve only if absolutely necessary

## 2014-06-25 NOTE — Assessment & Plan Note (Signed)
She has not taken statin recently due to ADR fears (after watching tv commercials).  I prescribed it at prior visit but she says she did not start it.  I discussed cost vs benefit of statins with her.  She is doing an excellent job of BP and DM control and this is another area that we should try to control to lower ASCVD risk.   - she is agreeable to starting Lipitor - she agrees to call clinic if she develops new symptoms, such as muscle ache  - she will follow-up in 1 month

## 2014-06-25 NOTE — Patient Instructions (Addendum)
1. I am referring you back to Sports Medicine.  Please try and decrease Aleve.  Take it with food and take your Prilosec every day. If you have N/V, change in appetite, worsening abdominal pain, blood in stools or dark stools come back to clinic or seek emergency help.   2. Please take all medications as prescribed.    3. If you have worsening of your symptoms or new symptoms arise, please call the clinic [phone]), or go to the ER immediately if symptoms are severe.  Come back to see me in 1 month to follow-up your epigastric pain.  If it completely resolves you can see me in 3 months.

## 2014-06-25 NOTE — Assessment & Plan Note (Signed)
BP Readings from Last 3 Encounters:  06/25/14 114/78  03/18/14 126/70  02/21/14 148/87    Lab Results  Component Value Date   NA 140 03/18/2014   K 3.6 03/18/2014   CREATININE 0.74 03/18/2014    Assessment: Blood pressure control:  well controlled Progress toward BP goal:   at goal  Plan: Medications:  continue current medications:  Lisinopril 20mg  daily and HCTZ 12.5mg  daily Other plans: follow-up BP at next visit.

## 2014-06-25 NOTE — Assessment & Plan Note (Signed)
Lab Results  Component Value Date   HGBA1C 5.6 06/25/2014   HGBA1C 5.6 03/18/2014   HGBA1C 5.2 12/04/2013     Assessment: Diabetes control:  well controlled Progress toward A1C goal:   at goal  Plan: Medications:  None, diet-controlled Home glucose monitoring: none Other plans: RTC in 3 months for follow-up of DM

## 2014-06-25 NOTE — Assessment & Plan Note (Signed)
It has been 6 months since thyroid function was checked. - TSH, free T3 and free T4 checked today - will continue to check q706months

## 2014-06-25 NOTE — Progress Notes (Signed)
   Subjective:    Patient ID: Nicole Baxter, female    DOB: 11/14/1941, 73 y.o.   MRN: [id number]  HPI Comments: [name] is a 73 year old woman with a PMH of HTN , DM type 2 (diet controlled, Hgb A1c 5.03 March 2014), knee OA and essential tremor.  Has c/o B/L knee pain (L > R).  Had B/L steroid injection by Sports Medicine 4 months ago.  She feels she did not get any relief from it.   Not particularly worse with walking. Takes Aleve (400mg ) x 1 for pain which works.  Rest also helps.  Aspercreme stopped working.  Sleeping fine.  She had previous complaint of shoulder pain which has improved.   During ROS she reports noticing epigastric burning x 2-3 weeks, usually notices it before meals, not necessarily worse at night.  Pepto bismol helps.  Denies heartburn, bad taste in mouth, N/V, diarrhea, blood in stool or dark stools.  Hx of GERD and has been on PPI for years.  She takes prilosec 5-6 days per week.       Review of Systems  Constitutional: Negative for fever, chills and appetite change.  Eyes: Negative for visual disturbance.  Respiratory: Negative for shortness of breath.   Cardiovascular: Negative for chest pain, palpitations and leg swelling.  Gastrointestinal: Negative for nausea, vomiting, abdominal pain, diarrhea, constipation and blood in stool.       Occasional epigastric burning.  Genitourinary: Negative for dysuria, frequency, hematuria and difficulty urinating.  Musculoskeletal:       Chronic B/L knee pain (L > R)  Neurological: Negative for syncope, weakness and light-headedness.  Psychiatric/Behavioral: Negative for sleep disturbance and dysphoric mood.       Objective:   Physical Exam  Vitals reviewed. Constitutional: She is oriented to person, place, and time. She appears well-developed. No distress.  HENT:  Head: Normocephalic and atraumatic.  Mouth/Throat: Oropharynx is clear and moist. No oropharyngeal exudate.  Eyes: EOM are normal. Pupils are equal, round, and  reactive to light.  Neck: No thyromegaly present.  Cardiovascular: Normal rate, regular rhythm and normal heart sounds.  Exam reveals no gallop and no friction rub.   No murmur heard. Pulmonary/Chest: Effort normal and breath sounds normal. No respiratory distress. She has no wheezes. She has no rales.  Abdominal: Soft. Bowel sounds are normal. She exhibits no distension. There is no tenderness.  Musculoskeletal: Normal range of motion. She exhibits tenderness. She exhibits no edema.  Knees minimally TTP; no crepitus or swelling noted.  Lymphadenopathy:    She has no cervical adenopathy.  Neurological: She is alert and oriented to person, place, and time. No cranial nerve deficit.  Skin: Skin is warm. She is not diaphoretic.  Psychiatric: She has a normal mood and affect. Her behavior is normal.          Assessment & Plan:  Please see problem based assessment and plan.

## 2014-06-25 NOTE — Assessment & Plan Note (Addendum)
She describes occasional epigastric burning but no reflux symptoms.  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  She has no alarm symptoms (appetite good, weight stable, no N/V/bloody stools/dark stools).   - CMP today to check renal function - I advised her to decrease Aleve use and she is agreeable.  If she must use Aleve she agrees to take it with food.  She will continue Prilosec 40mg  daily. - she is aware to look out for alarming symptoms and to return to clinic or seek emergency help if they occur - return to clinic in 1 month to follow-up

## 2014-06-26 NOTE — Progress Notes (Signed)
Case discussed with Dr. Wilson at the time of the visit.  We reviewed the resident's history and exam and pertinent patient test results.  I agree with the assessment, diagnosis, and plan of care documented in the resident's note. 

## 2014-06-27 ENCOUNTER — Ambulatory Visit (INDEPENDENT_AMBULATORY_CARE_PROVIDER_SITE_OTHER): Payer: Medicare HMO | Admitting: Internal Medicine

## 2014-06-27 ENCOUNTER — Encounter: Payer: Self-pay | Admitting: Internal Medicine

## 2014-06-27 ENCOUNTER — Other Ambulatory Visit: Payer: Self-pay | Admitting: Internal Medicine

## 2014-06-27 VITALS — BP 117/84 | HR 96 | Temp 97.7°F | Ht 61.0 in | Wt 163.2 lb

## 2014-06-27 DIAGNOSIS — K2960 Other gastritis without bleeding: Secondary | ICD-10-CM

## 2014-06-27 DIAGNOSIS — E785 Hyperlipidemia, unspecified: Secondary | ICD-10-CM

## 2014-06-27 DIAGNOSIS — T39395A Adverse effect of other nonsteroidal anti-inflammatory drugs [NSAID], initial encounter: Secondary | ICD-10-CM

## 2014-06-27 DIAGNOSIS — T3995XA Adverse effect of unspecified nonopioid analgesic, antipyretic and antirheumatic, initial encounter: Secondary | ICD-10-CM

## 2014-06-27 DIAGNOSIS — Z0000 Encounter for general adult medical examination without abnormal findings: Secondary | ICD-10-CM

## 2014-06-27 DIAGNOSIS — K921 Melena: Secondary | ICD-10-CM

## 2014-06-27 DIAGNOSIS — T398X5A Adverse effect of other nonopioid analgesics and antipyretics, not elsewhere classified, initial encounter: Secondary | ICD-10-CM

## 2014-06-27 DIAGNOSIS — K319 Disease of stomach and duodenum, unspecified: Principal | ICD-10-CM

## 2014-06-27 MED ORDER — ATORVASTATIN CALCIUM 80 MG PO TABS: 80.0000 mg | ORAL_TABLET | Freq: Every day | ORAL | Status: DC

## 2014-06-27 NOTE — Patient Instructions (Addendum)
1. Stop taking Aleve.  Use Tylenol as needed and aspercreme for your pain.  Take prilosec 40mg  daily to help protect your stomach.  I am giving you stool cards.  Please provide the sample and return the cards to clinic as soon as possible.  I am referring you to GI for colonoscopy.  If you are still having symptoms or stool cards are positive for blood you may need an EGD.   2. Please take all medications as prescribed.     3. If you have worsening of your symptoms or new symptoms arise, please call the clinic [phone]), or go to the ER immediately if symptoms are severe.   Return to clinic in 1 month or sooner if needed. Gastritis, Adult Gastritis is soreness and swelling (inflammation) of the lining of the stomach. Gastritis can develop as a sudden onset (acute) or long-term (chronic) condition. If gastritis is not treated, it can lead to stomach bleeding and ulcers. CAUSES  Gastritis occurs when the stomach lining is weak or damaged. Digestive juices from the stomach then inflame the weakened stomach lining. The stomach lining may be weak or damaged due to viral or bacterial infections. One common bacterial infection is the Helicobacter pylori infection. Gastritis can also result from excessive alcohol consumption, taking certain medicines, or having too much acid in the stomach.  SYMPTOMS  In some cases, there are no symptoms. When symptoms are present, they may include:  Pain or a burning sensation in the upper abdomen.  Nausea.  Vomiting.  An uncomfortable feeling of fullness after eating. DIAGNOSIS  Your caregiver may suspect you have gastritis based on your symptoms and a physical exam. To determine the cause of your gastritis, your caregiver may perform the following:  Blood or stool tests to check for the H pylori bacterium.  Gastroscopy. A thin, flexible tube (endoscope) is passed down the esophagus and into the stomach. The endoscope has a light and camera on the end. Your  caregiver uses the endoscope to view the inside of the stomach.  Taking a tissue sample (biopsy) from the stomach to examine under a microscope. TREATMENT  Depending on the cause of your gastritis, medicines may be prescribed. If you have a bacterial infection, such as an H pylori infection, antibiotics may be given. If your gastritis is caused by too much acid in the stomach, H2 blockers or antacids may be given. Your caregiver may recommend that you stop taking aspirin, ibuprofen, or other nonsteroidal anti-inflammatory drugs (NSAIDs). HOME CARE INSTRUCTIONS  Only take over-the-counter or prescription medicines as directed by your caregiver.  If you were given antibiotic medicines, take them as directed. Finish them even if you start to feel better.  Drink enough fluids to keep your urine clear or pale yellow.  Avoid foods and drinks that make your symptoms worse, such as:  Caffeine or alcoholic drinks.  Chocolate.  Peppermint or mint flavorings.  Garlic and onions.  Spicy foods.  Citrus fruits, such as oranges, lemons, or limes.  Tomato-based foods such as sauce, chili, salsa, and pizza.  Fried and fatty foods.  Eat small, frequent meals instead of large meals. SEEK IMMEDIATE MEDICAL CARE IF:   You have black or dark red stools.  You vomit blood or material that looks like coffee grounds.  You are unable to keep fluids down.  Your abdominal pain gets worse.  You have a fever.  You do not feel better after 1 week.  You have any other questions or concerns.  MAKE SURE YOU:  Understand these instructions.  Will watch your condition.  Will get help right away if you are not doing well or get worse. Document Released: 11/08/2001 Document Revised: 05/15/2012 Document Reviewed: 12/28/2011 [facility] Patient Information 2015 [place], [place]. This information is not intended to replace advice given to you by your health care provider. Make sure you discuss any questions  you have with your health care provider.

## 2014-06-27 NOTE — Progress Notes (Signed)
   Subjective:    Patient ID: Nicole Baxter, female    DOB: 12/16/1940, 73 y.o.   MRN: [id number]  HPI Comments: Nicole Baxter is a 73 year old woman with a PMH of HTN , DM type 2 (diet controlled, Hgb A1c 5.03 March 2014), knee OA and essential tremor.   She is here with c/o of black stools x 1 week.  She was here two days ago for follow-up of B/L knee pain at which time she denied bloody or dark stools.  She has been taking 1-2 Aleve or an ASA daily x 4-5 months for her knee pain.  Associated symptoms include epigastric burning, not affected by eating.  No N/V, heartburn.  She has not taken Prilosec in about 1 month.  She does not take Fe supplement.  CBC two days ago was normal.     Review of Systems  Constitutional: Negative for fever, chills and appetite change.  Respiratory: Negative for shortness of breath.   Cardiovascular: Negative for chest pain.  Gastrointestinal: Positive for abdominal pain and blood in stool. Negative for nausea, vomiting, diarrhea and constipation.  Genitourinary: Negative for dysuria, frequency and hematuria.  Neurological: Negative for syncope, weakness and light-headedness.       Objective:   Physical Exam  Vitals reviewed. Constitutional: She is oriented to person, place, and time. She appears well-developed. No distress.  HENT:  Head: Normocephalic and atraumatic.  Mouth/Throat: Oropharynx is clear and moist. No oropharyngeal exudate.  Eyes: EOM are normal. Pupils are equal, round, and reactive to light.  Cardiovascular: Normal rate, regular rhythm and normal heart sounds.  Exam reveals no gallop and no friction rub.   No murmur heard. Pulmonary/Chest: Effort normal and breath sounds normal. No respiratory distress. She has no wheezes. She has no rales.  Abdominal: Soft. Bowel sounds are normal. She exhibits no distension. There is no tenderness. There is no rebound and no guarding.  Genitourinary: Guaiac negative stool.  No gross blood on rectal exam.    Musculoskeletal: Normal range of motion. She exhibits no edema and no tenderness.  Lymphadenopathy:    She has no cervical adenopathy.  Neurological: She is alert and oriented to person, place, and time. No cranial nerve deficit.  Skin: Skin is warm. She is not diaphoretic.  Psychiatric: She has a normal mood and affect. Her behavior is normal.          Assessment & Plan:  Please see problem based assessment and plan.

## 2014-06-27 NOTE — Assessment & Plan Note (Addendum)
Dark stools times 1 week.  No N/V, change in appetite, weight loss.  Associated epigastric burning.  These symptoms likely 2/2 to stomach irritation in the setting of chronic NSAID use.  Hgb normal two days ago.  She is not orthostatic and vital signs are stable.  FOBT in clinic negative.  She is stable for discharge home and outpatient management. - she will stop NSAIDs and use Tylenol prn and aspercreme for knee pain - she buys Prilosec OTC and will pick some up today and begin taking 40mg  daily - provided her with stool cards - [facility] - if stool cards positive will need follow-up with GI for possible EGD - she was advised to seek emergency care if severe symptoms, including N/V, worsening abdominal pain, lightheadedness - will refer for colonoscopy today (she had polyps in 2011 and is due for repeat screening)

## 2014-06-27 NOTE — Assessment & Plan Note (Signed)
Referral for colonoscopy today.

## 2014-06-30 NOTE — Progress Notes (Signed)
Case discussed with Dr. Wilson at the time of the visit.  We reviewed the resident's history and exam and pertinent patient test results.  I agree with the assessment, diagnosis, and plan of care documented in the resident's note. 

## 2014-07-01 ENCOUNTER — Telehealth: Payer: Self-pay | Admitting: Internal Medicine

## 2014-07-01 ENCOUNTER — Other Ambulatory Visit (INDEPENDENT_AMBULATORY_CARE_PROVIDER_SITE_OTHER): Payer: Medicare HMO

## 2014-07-01 DIAGNOSIS — K921 Melena: Secondary | ICD-10-CM

## 2014-07-01 LAB — POC HEMOCCULT BLD/STL (HOME/3-CARD/SCREEN)
FECAL OCCULT BLD: NEGATIVE
FECAL OCCULT BLD: NEGATIVE
FECAL OCCULT BLD: NEGATIVE

## 2014-07-01 NOTE — Telephone Encounter (Signed)
I called to inform Nicole Baxter that stool cards were negative for occult blood.  She stopped taking NSAID and she has started taking daily Prilosec.  She says her stools are back to normal color.  She denies N/V, decreased appetite or lightheadedness.  Epigastric burning is improving.  She will follow-up with me in clinic at the end of the month.

## 2014-07-18 ENCOUNTER — Ambulatory Visit (INDEPENDENT_AMBULATORY_CARE_PROVIDER_SITE_OTHER): Payer: Medicare HMO | Admitting: Family Medicine

## 2014-07-18 ENCOUNTER — Encounter: Payer: Self-pay | Admitting: Family Medicine

## 2014-07-18 VITALS — BP 128/81 | HR 91 | Ht 62.0 in | Wt 160.0 lb

## 2014-07-18 DIAGNOSIS — M170 Bilateral primary osteoarthritis of knee: Secondary | ICD-10-CM

## 2014-07-18 DIAGNOSIS — M1710 Unilateral primary osteoarthritis, unspecified knee: Secondary | ICD-10-CM

## 2014-07-18 MED ORDER — METHYLPREDNISOLONE ACETATE 40 MG/ML IJ SUSP
40.0000 mg | Freq: Once | INTRAMUSCULAR | Status: AC
  Administered 2014-07-18: 40 mg via INTRA_ARTICULAR

## 2014-07-18 NOTE — Progress Notes (Signed)
Subjective:    Patient ID: Nicole Baxter, female    DOB: 02/15/1941, 73 y.o.   MRN: [id number]  HPI [name] is a 73 year old female with past medical history of bilateral knee osteoarthritis who presents for followup of bilateral knee pain. At her last visit in March she received a bilateral knee corticosteroid injection, which she said did not provide any relief. She complains of persistent bilateral anterior knee pain that is aggravated with standing from a seated position or walking up and down stairs. Radiographs were obtained in July 2015 and were reviewed by me today. Despite that the knee radiographs were not standing views, the medial joint spaces bilaterally were significantly narrowed. Femoral condyle and peripatellar osteophytes are seen. The patient was previously prescribed Celebrex, but she states she was told to discontinue this due to her history of gastritis with GI blood loss. She has since started axis strength, which occasionally helps with pain. She also applies topical Aspercreme and wears bilateral knee braces, which relieves her symptoms as well. Severity of pain is severe at times. Her pain does wake her up at night from sleep. She tries to be active with walking. She ambulates with a cane. She denies any knee giving way, but admits to sensation of instability, swelling, painful cracking and popping. She is also diabetic, not on insulin, and per the patient her blood sugars are well controlled. She is not interested in knee replacement surgery at this time. She tried physical therapy several times in the past with no relief of symptoms.  Past Medical History  Diagnosis Date  . Obesity   . Hyperglycemia     isolated FBS 122, 95, 104 (Random 138)  . 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 GERD (gastroesophageal reflux disease)     controlled on prilosec  . Hyperlipidemia     on pravastatin  . Hypertension     controlled on 2 agents  . Tremor     Review  of Systems As per history of present illness, and remainder 11 point review of systems was reviewed and is otherwise negative.    Objective:   Physical Exam BP 128/81  Pulse 91  Ht 5\' 2"  (1.575 m)  Wt 160 lb (72.576 kg)  BMI 29.26 kg/m2 GEN: The patient is well-developed well-nourished female and in no acute distress.  She is awake alert and oriented x3. SKIN: warm and well-perfused, no rash  EXTR: No lower extremity edema or calf tenderness Neuro: Strength 5/5 globally. Sensation intact throughout. DTRs 2/4 bilaterally. No focal deficits. The patient has a resting pill-rolling tremor in the left upper extremity. Vasc: +2 bilateral distal pulses. Trace bilateral peripheral edema. MSK: Examination of the bilateral knees reveals no effusion. Range of motion is limited due to pain bilaterally. Medial and lateral joint space tenderness, as well as peripatellar tenderness. No knee instability. No overlying erythema or induration.  Procedure:  Risks, benefits and complications were reviewed with the patient. After obtaining informed verbal consent the right knee was sterilely prepped with alcohol.  Ethyl chloride was used to anesthetize the skin and intra-articular injection using a 25-gauge by 1-1/2 inch needle was accomplished without difficulty.  We injected 40mg  methylprednisolone and 4cc's 1% lidocaine without epinephrine.  Patient tolerated the procedure well and was observed for 5-10 min. Post injection instructions, including signs and symptoms of complications were discussed.  Risks, benefits and complications were reviewed with the patient. After obtaining informed verbal consent the left  knee was sterilely prepped with alcohol.  Ethyl chloride was used to anesthetize the skin and intra-articular injection using a 25-gauge by 1-1/2 inch needle was accomplished without difficulty.  We injected 40mg  methylprednisolone and 4cc's 1% lidocaine without epinephrine.  Patient tolerated the procedure  well and was observed for 5-10 min. Post injection instructions, including signs and symptoms of complications were discussed.    Assessment & Plan:  1. Bilateral knee end-stage osteoarthritis, thus far refractory to corticosteroid injections.  -The patient tolerated the bilateral knee corticosteroid injections today. -We will send a message to our staff to attempt to arrange approval for discussed supplementation. We will start with the more painful knee, which today was the left knee. -She will continue extra strength Tylenol and try to remain a gentle exercise program. She should continue to wear her knee braces for support. -We will see her back pending approval of viscous supplementation. The patient is not interested in orthopedic referral for consideration of knee replacement at this time.

## 2014-07-18 NOTE — Assessment & Plan Note (Signed)
2nd trial CST injection bilateral knees. Improved, but we will seek approval for visco-supplementation. Patient not interested in knee replacement surgery. Taking Tylenol. NO NSAIDS due to UGI bleed hx, melanotic stools.

## 2014-07-23 ENCOUNTER — Ambulatory Visit (INDEPENDENT_AMBULATORY_CARE_PROVIDER_SITE_OTHER): Payer: Medicare HMO | Admitting: Internal Medicine

## 2014-07-23 ENCOUNTER — Encounter: Payer: Self-pay | Admitting: Internal Medicine

## 2014-07-23 VITALS — BP 123/60 | HR 98 | Temp 97.5°F | Ht 61.5 in | Wt 163.7 lb

## 2014-07-23 DIAGNOSIS — I10 Essential (primary) hypertension: Secondary | ICD-10-CM

## 2014-07-23 DIAGNOSIS — T39395A Adverse effect of other nonsteroidal anti-inflammatory drugs [NSAID], initial encounter: Secondary | ICD-10-CM

## 2014-07-23 DIAGNOSIS — M1710 Unilateral primary osteoarthritis, unspecified knee: Secondary | ICD-10-CM

## 2014-07-23 DIAGNOSIS — E119 Type 2 diabetes mellitus without complications: Secondary | ICD-10-CM

## 2014-07-23 DIAGNOSIS — E785 Hyperlipidemia, unspecified: Secondary | ICD-10-CM

## 2014-07-23 DIAGNOSIS — K2960 Other gastritis without bleeding: Secondary | ICD-10-CM

## 2014-07-23 DIAGNOSIS — Z0000 Encounter for general adult medical examination without abnormal findings: Secondary | ICD-10-CM

## 2014-07-23 DIAGNOSIS — M170 Bilateral primary osteoarthritis of knee: Secondary | ICD-10-CM

## 2014-07-23 DIAGNOSIS — E0590 Thyrotoxicosis, unspecified without thyrotoxic crisis or storm: Secondary | ICD-10-CM

## 2014-07-23 DIAGNOSIS — K319 Disease of stomach and duodenum, unspecified: Principal | ICD-10-CM

## 2014-07-23 LAB — GLUCOSE, CAPILLARY: GLUCOSE-CAPILLARY: 88 mg/dL (ref 70–99)

## 2014-07-23 MED ORDER — LISINOPRIL 20 MG PO TABS: 20.0000 mg | ORAL_TABLET | Freq: Every day | ORAL | Status: DC

## 2014-07-23 MED ORDER — HYDROCHLOROTHIAZIDE 12.5 MG PO TABS: 12.5000 mg | ORAL_TABLET | Freq: Every day | ORAL | Status: DC

## 2014-07-23 NOTE — Assessment & Plan Note (Signed)
BP Readings from Last 3 Encounters:  07/23/14 123/60  07/18/14 128/81  06/27/14 117/84    Lab Results  Component Value Date   NA 142 06/25/2014   K 3.5 06/25/2014   CREATININE 0.79 06/25/2014    Assessment: Blood pressure control:  well controlled  Progress toward BP goal:   at goal  Plan: Medications:  continue current medications:  Lisinopril  daily and HCTZ 12.5mg  daily Educational resources provided: brochure Other plans: follow-up BP at next visit

## 2014-07-23 NOTE — Assessment & Plan Note (Signed)
Previously referred for colonoscopy but this has not been arranged yet.  Referring for DEXA and radioactive iodine uptake and scan.  Will refer for mammogram and colonoscopy at next visit after DEXA and thyroid study complete.

## 2014-07-23 NOTE — Assessment & Plan Note (Signed)
No ADRs or muscle ache since starting Lipitor. - will continue Lipitor

## 2014-07-23 NOTE — Patient Instructions (Signed)
There are several brands of viscosupplement on the market.  There is a lack of reliable evidence that any one brand of viscosupplement is superior to other brands for medically necessary indications.  Euflexxa (1 % sodium hyaluronate) and Orthovisc (high molecular weight form of hyaluronic acid) brands of viscosupplement ("least cost brands of viscosupplement") are less costly to [employer].  Consequently, because other brands of viscosupplement (e.g., Hyalgan (sodium hyaluronate), Supartz (sodium hyaluronate), Synvisc (Hylan G-F 20), Gel-One (cross-linked hyaluronate), Monovisc (high molecular weight form of hyaluronic acid) and Synvisc One (Hylan G-F 20)) are more costly than these least cost brands of viscosupplement, and least cost brands of viscosupplement are at least as likely to produce equivalent therapeutic results, no other brands of viscosupplement will be considered medically necessary unless the member has a documented contraindication or intolerance to the 2 least cost brands of viscosupplement, Euflexxa and Orthovisc.

## 2014-07-23 NOTE — Assessment & Plan Note (Addendum)
TSH < 0 .1 (now 0.019).  Will obtain radioactive iodine uptake and scan to evaluate gland.  Normal DEXA 6 years ago.  There is risk of low bone density with subclinical hyperthyroid.  Will obtain DEXA which may guide need for further therapy.  She will return to clinic in 1 month, after imaging.

## 2014-07-23 NOTE — Progress Notes (Signed)
   Subjective:    Patient ID: Nicole Baxter, female    DOB: July 04, 1941, 73 y.o.   MRN: [id number]  HPI Comments: Ms. Blakeney is a 73 year old woman with a PMH of HTN , DM type 2 (diet controlled, Hgb A1c 5.03 March 2014), knee OA and essential tremor.  She is here for follow-up of abdominal pain/dark stools a few weeks ago.  Please see problem based charting for update of this and other conditions.     Review of Systems  Constitutional: Negative for fever, chills and appetite change.  Respiratory: Negative for shortness of breath.   Cardiovascular: Negative for chest pain, palpitations and leg swelling.  Gastrointestinal: Negative for nausea, vomiting, diarrhea and blood in stool.  Genitourinary: Negative for dysuria and hematuria.  Neurological: Negative for weakness, light-headedness and headaches.       Objective:   Physical Exam  Vitals reviewed. Constitutional: She is oriented to person, place, and time. She appears well-developed. No distress.  HENT:  Head: Normocephalic and atraumatic.  Mouth/Throat: Oropharynx is clear and moist. No oropharyngeal exudate.  Eyes: EOM are normal. Pupils are equal, round, and reactive to light.  Cardiovascular: Normal rate, regular rhythm and normal heart sounds.  Exam reveals no gallop and no friction rub.   No murmur heard. Pulmonary/Chest: Effort normal and breath sounds normal. No respiratory distress. She has no wheezes. She has no rales.  Abdominal: Soft. Bowel sounds are normal. She exhibits no distension. There is no tenderness. There is no rebound.  Musculoskeletal: Normal range of motion. She exhibits no edema and no tenderness.  Knees non-tender   Neurological: She is alert and oriented to person, place, and time. No cranial nerve deficit.  Skin: Skin is warm. She is not diaphoretic.  Psychiatric: She has a normal mood and affect. Her behavior is normal.          Assessment & Plan:  Please see problem based assessment and plan.

## 2014-07-23 NOTE — Assessment & Plan Note (Signed)
She says her knees feel ok after steroid injections by [facility].  Pain controlled after injection and Tylenol prn.

## 2014-07-23 NOTE — Assessment & Plan Note (Signed)
Symptoms resolved off NSAIDs.  She feels well.  No belly pain, dark stools or blood in stools.  Stool cards negative.  - continue PPI - avoid NSAIDs

## 2014-07-23 NOTE — Patient Instructions (Addendum)
General Instructions: 1. I will refer you for for thyroid scan/uptake and for DEXA scan.  Please see me again in 1 month, after these studies have been done.  2. Please take all medications as prescribed.   3. If you have worsening of your symptoms or new symptoms arise, please call the clinic ([phone]), or go to the ER immediately if symptoms are severe.  You have done great job in taking all your medications. I appreciate it very much. Please continue doing that.   Treatment Goals:  Goals (1 Years of Data) as of 07/23/14         As of Today 07/18/14 06/27/14 06/27/14 06/27/14     Blood Pressure    . Blood Pressure < 140/90  123/60 128/81 117/84 107/71 121/84     Exercise    . Exercise 3x per week (30 min per time)            Progress Toward Treatment Goals:  Treatment Goal 07/23/2014  Hemoglobin A1C at goal  Blood pressure at goal    Self Care Goals & Plans:  Self Care Goal 07/23/2014  Manage my medications take my medicines as prescribed; bring my medications to every visit; refill my medications on time  Eat healthy foods drink diet soda or water instead of juice or soda; eat more vegetables; eat foods that are low in salt; eat baked foods instead of fried foods; eat fruit for snacks and desserts  Meeting treatment goals -    Home Blood Glucose Monitoring 03/18/2014  Check my blood sugar no home glucose monitoring  When to check my blood sugar N/A     Care Management & Community Referrals:  Referral 03/18/2014  Referrals made for care management support none needed  Referrals made to community resources none

## 2014-07-23 NOTE — Addendum Note (Signed)
Addended by: Neomia Dear on: 07/23/2014 09:02 PM   Modules accepted: Orders

## 2014-07-24 ENCOUNTER — Other Ambulatory Visit: Payer: Self-pay | Admitting: Internal Medicine

## 2014-07-24 DIAGNOSIS — E0590 Thyrotoxicosis, unspecified without thyrotoxic crisis or storm: Secondary | ICD-10-CM

## 2014-07-29 NOTE — Progress Notes (Signed)
Internal Medicine Clinic Attending Date of visit: 07/23/2014   Case discussed with Dr. Andrey Campanile soon after the resident saw the patient.  We reviewed the resident's history and exam and pertinent patient test results.  I agree with the assessment, diagnosis, and plan of care documented in the resident's note.  Correction: Patient had dark stools a few weeks ago, but no recurrence reported at this time.

## 2014-08-11 NOTE — Progress Notes (Signed)
Patient ID: Nicole Baxter, female   DOB: 1941/01/09, 73 y.o.   MRN: [id number] Opened in error Pt cancelled

## 2014-08-26 ENCOUNTER — Encounter: Payer: Self-pay | Admitting: *Deleted

## 2014-09-08 ENCOUNTER — Encounter: Payer: Self-pay | Admitting: Gastroenterology

## 2014-10-15 ENCOUNTER — Encounter: Payer: Self-pay | Admitting: Internal Medicine

## 2014-10-15 ENCOUNTER — Ambulatory Visit (INDEPENDENT_AMBULATORY_CARE_PROVIDER_SITE_OTHER): Payer: Medicare HMO | Admitting: Internal Medicine

## 2014-10-15 VITALS — BP 124/88 | HR 93 | Temp 97.3°F | Ht 63.0 in | Wt 160.0 lb

## 2014-10-15 DIAGNOSIS — I10 Essential (primary) hypertension: Secondary | ICD-10-CM

## 2014-10-15 DIAGNOSIS — E0590 Thyrotoxicosis, unspecified without thyrotoxic crisis or storm: Secondary | ICD-10-CM

## 2014-10-15 DIAGNOSIS — Z0000 Encounter for general adult medical examination without abnormal findings: Secondary | ICD-10-CM

## 2014-10-15 DIAGNOSIS — E119 Type 2 diabetes mellitus without complications: Secondary | ICD-10-CM

## 2014-10-15 DIAGNOSIS — K219 Gastro-esophageal reflux disease without esophagitis: Secondary | ICD-10-CM

## 2014-10-15 LAB — T4, FREE: Free T4: 0.66 ng/dL — ABNORMAL LOW (ref 0.80–1.80)

## 2014-10-15 LAB — GLUCOSE, CAPILLARY: GLUCOSE-CAPILLARY: 88 mg/dL (ref 70–99)

## 2014-10-15 LAB — TSH: TSH: 0.025 u[IU]/mL — ABNORMAL LOW (ref 0.350–4.500)

## 2014-10-15 LAB — T3, FREE: T3 FREE: 3.5 pg/mL (ref 2.3–4.2)

## 2014-10-15 LAB — POCT GLYCOSYLATED HEMOGLOBIN (HGB A1C): Hemoglobin A1C: 5.5

## 2014-10-15 NOTE — Patient Instructions (Signed)
General Instructions:  1. Please continue Prilosec.  I have included some more information on GERD at the bottom of this paper.    2. Please take all medications as prescribed.    3. If you have worsening of your symptoms or new symptoms arise, please call the clinic [phone]), or go to the ER immediately if symptoms are severe.  Please try to bring all your medicines next time. This helps us take good care of you and stops mistakes from medicines that could hurt you.  We will call you with appointment information for thyroid scan and DEXA scan.  Please return to see me after after you have these tests (let's plan for December).  Treatment Goals:  Goals (1 Years of Data) as of 10/15/14          As of Today 07/23/14 07/18/14 06/27/14 06/27/14     Blood Pressure   . Blood Pressure < 140/90  124/88 123/60 128/81 117/84 107/71     Exercise   . Exercise 3x per week (30 min per time)            Progress Toward Treatment Goals:  Treatment Goal 07/23/2014  Hemoglobin A1C at goal  Blood pressure at goal    Self Care Goals & Plans:  Self Care Goal 10/15/2014  Manage my medications take my medicines as prescribed; bring my medications to every visit; refill my medications on time  Eat healthy foods drink diet soda or water instead of juice or soda; eat more vegetables; eat foods that are low in salt; eat baked foods instead of fried foods; eat fruit for snacks and desserts  Meeting treatment goals -    Home Blood Glucose Monitoring 03/18/2014  Check my blood sugar no home glucose monitoring  When to check my blood sugar N/A     Care Management & Community Referrals:  Referral 03/18/2014  Referrals made for care management support none needed  Referrals made to community resources none     Food Choices for Gastroesophageal Reflux Disease When you have gastroesophageal reflux disease (GERD), the foods you eat and your eating habits are very important. Choosing the right foods can  help ease the discomfort of GERD. WHAT GENERAL GUIDELINES DO I NEED TO FOLLOW?  Choose fruits, vegetables, whole grains, low-fat dairy products, and low-fat meat, fish, and poultry.  Limit fats such as oils, salad dressings, butter, nuts, and avocado.  Keep a food diary to identify foods that cause symptoms.  Avoid foods that cause reflux. These may be different for different people.  Eat frequent small meals instead of three large meals each day.  Eat your meals slowly, in a relaxed setting.  Limit fried foods.  Cook foods using methods other than frying.  Avoid drinking alcohol.  Avoid drinking large amounts of liquids with your meals.  Avoid bending over or lying down until 2-3 hours after eating. WHAT FOODS ARE NOT RECOMMENDED? The following are some foods and drinks that may worsen your symptoms: Vegetables Tomatoes. Tomato juice. Tomato and spaghetti sauce. Chili peppers. Onion and garlic. Horseradish. Fruits Oranges, grapefruit, and lemon (fruit and juice). Meats High-fat meats, fish, and poultry. This includes hot dogs, ribs, ham, sausage, salami, and bacon. Dairy Whole milk and chocolate milk. Sour cream. Cream. Butter. Ice cream. Cream cheese.  Beverages Coffee and tea, with or without caffeine. Carbonated beverages or energy drinks. Condiments Hot sauce. Barbecue sauce.  Sweets/Desserts Chocolate and cocoa. Donuts. Peppermint and spearmint. Fats and Oils High-fat foods, including  [place] fries and potato chips. Other Vinegar. Strong spices, such as black pepper, white pepper, red pepper, cayenne, curry powder, cloves, ginger, and chili powder. The items listed above may not be a complete list of foods and beverages to avoid. Contact your dietitian for more information. Document Released: 11/14/2005 Document Revised: 11/19/2013 Document Reviewed: 09/18/2013 [facility] Patient Information 2015 [place], [place]. This information is not intended to replace advice  given to you by your health care provider. Make sure you discuss any questions you have with your health care provider.  Gastroesophageal Reflux Disease, Adult Gastroesophageal reflux disease (GERD) happens when acid from your stomach flows up into the esophagus. When acid comes in contact with the esophagus, the acid causes soreness (inflammation) in the esophagus. Over time, GERD may create small holes (ulcers) in the lining of the esophagus. CAUSES   Increased body weight. This puts pressure on the stomach, making acid rise from the stomach into the esophagus.  Smoking. This increases acid production in the stomach.  Drinking alcohol. This causes decreased pressure in the lower esophageal sphincter (valve or ring of muscle between the esophagus and stomach), allowing acid from the stomach into the esophagus.  Late evening meals and a full stomach. This increases pressure and acid production in the stomach.  A malformed lower esophageal sphincter. Sometimes, no cause is found. SYMPTOMS   Burning pain in the lower part of the mid-chest behind the breastbone and in the mid-stomach area. This may occur twice a week or more often.  Trouble swallowing.  Sore throat.  Dry cough.  Asthma-like symptoms including chest tightness, shortness of breath, or wheezing. DIAGNOSIS  Your caregiver may be able to diagnose GERD based on your symptoms. In some cases, X-rays and other tests may be done to check for complications or to check the condition of your stomach and esophagus. TREATMENT  Your caregiver may recommend over-the-counter or prescription medicines to help decrease acid production. Ask your caregiver before starting or adding any new medicines.  HOME CARE INSTRUCTIONS   Change the factors that you can control. Ask your caregiver for guidance concerning weight loss, quitting smoking, and alcohol consumption.  Avoid foods and drinks that make your symptoms worse, such as:  Caffeine  or alcoholic drinks.  Chocolate.  Peppermint or mint flavorings.  Garlic and onions.  Spicy foods.  Citrus fruits, such as oranges, lemons, or limes.  Tomato-based foods such as sauce, chili, salsa, and pizza.  Fried and fatty foods.  Avoid lying down for the 3 hours prior to your bedtime or prior to taking a nap.  Eat small, frequent meals instead of large meals.  Wear loose-fitting clothing. Do not wear anything tight around your waist that causes pressure on your stomach.  Raise the head of your bed 6 to 8 inches with wood blocks to help you sleep. Extra pillows will not help.  Only take over-the-counter or prescription medicines for pain, discomfort, or fever as directed by your caregiver.  Do not take aspirin, ibuprofen, or other nonsteroidal anti-inflammatory drugs (NSAIDs). SEEK IMMEDIATE MEDICAL CARE IF:   You have pain in your arms, neck, jaw, teeth, or back.  Your pain increases or changes in intensity or duration.  You develop nausea, vomiting, or sweating (diaphoresis).  You develop shortness of breath, or you faint.  Your vomit is green, yellow, black, or looks like coffee grounds or blood.  Your stool is red, bloody, or black. These symptoms could be signs of other problems, such as heart disease,  gastric bleeding, or esophageal bleeding. MAKE SURE YOU:   Understand these instructions.  Will watch your condition.  Will get help right away if you are not doing well or get worse. Document Released: 08/24/2005 Document Revised: 02/06/2012 Document Reviewed: 06/03/2011 [facility] Patient Information 2015 [place], [place]. This information is not intended to replace advice given to you by your health care provider. Make sure you discuss any questions you have with your health care provider.

## 2014-10-15 NOTE — Progress Notes (Signed)
   Subjective:    Patient ID: Nicole Baxter, female    DOB: 12/01/1940, 73 y.o.   MRN: [id number]  HPI Comments: Nicole Baxter is a 73 year old woman with a PMH of HTN , DM type 2 (diet controlled, Hgb A1c 5.02 June 2014), knee OA and essential tremor.  C/o of heartburn occuring when she lays down to sleep.  Denies cough or bad taste in mouth.  Compliant with Prilosec which was working well up until 1 month ago.  It subsides on its own.  No increased caffeine, citrus or dietary changes.  Eats dinner about 4-5 hours before bedtime.   Not an issue during the day.  She feels symptoms have improved since she stopped taking Align about 1 week ago.  She says she has no had heartburn in the past two weeks.        Review of Systems  Constitutional: Negative for fever, chills and appetite change.  Eyes: Negative for visual disturbance.  Respiratory: Negative for shortness of breath.   Cardiovascular: Negative for chest pain, palpitations and leg swelling.  Gastrointestinal: Negative for nausea, vomiting, abdominal pain, diarrhea, constipation and blood in stool.  Genitourinary: Negative for dysuria.       Objective:   Physical Exam  Constitutional: She is oriented to person, place, and time. She appears well-developed. No distress.  HENT:  Head: Normocephalic and atraumatic.  Mouth/Throat: Oropharynx is clear and moist. No oropharyngeal exudate.  Eyes: EOM are normal. Pupils are equal, round, and reactive to light.  Neck: No thyromegaly present.  Cardiovascular: Normal rate, regular rhythm, normal heart sounds and intact distal pulses.  Exam reveals no gallop and no friction rub.   No murmur heard. Pulmonary/Chest: Effort normal and breath sounds normal. No respiratory distress. She has no wheezes.  Abdominal: Soft. Bowel sounds are normal. She exhibits no distension. There is no tenderness. There is no rebound.  Musculoskeletal: Normal range of motion. She exhibits no edema or tenderness.    Neurological: She is alert and oriented to person, place, and time. No cranial nerve deficit.  Skin: Skin is warm. She is not diaphoretic.  Psychiatric: She has a normal mood and affect. Her behavior is normal.  Vitals reviewed.         Assessment & Plan:  Please see problem based assessment and plan.

## 2014-10-16 MED ORDER — ALBUTEROL SULFATE HFA 108 (90 BASE) MCG/ACT IN AERS: 2.0000 | INHALATION_SPRAY | RESPIRATORY_TRACT | Status: DC | PRN

## 2014-10-16 NOTE — Assessment & Plan Note (Signed)
She attributes recent heartburn symptoms to Align and symptoms have improved in the past two weeks.  I explained to Ms. Scruton that I think this was probably not related to Align but she can stop taking it if she feels better.  No alarm features but if heartburn returns she may benefit from GI referral.  - continue Protonix - avoid foods that trigger reflux (she was provided with a handout) - return in 1 month for follow-up

## 2014-10-16 NOTE — Assessment & Plan Note (Signed)
Lab Results  Component Value Date   HGBA1C 5.5 10/15/2014   HGBA1C 5.6 06/25/2014   HGBA1C 5.6 03/18/2014     Assessment: Diabetes control:  controlled Progress toward A1C goal:   at goal  Plan: Medications:  None; Diet controlled Home glucose monitoring: Frequency:   Timing:   Instruction/counseling given: reminded to bring medications to each visit Educational resources provided: brochure (does not need information) Other plans: follow-up in 3 months

## 2014-10-16 NOTE — Assessment & Plan Note (Addendum)
She has not yet gone for thyroid scan or DEXA.  She said she never received a call regarding appointment times. 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  I will have patient come in next week to repeat the labs since I question the accuracy of these results.  If repeat TSH and T4 are both suppressed she will need MRI to evaluate for pituitary adenoma or hypothalamic lesion.  She is scheduled for thyroid uptake and scan 12/09-12/10/15 which was previously ordered to evaluate etiology of subclinical hyperthyroidism.

## 2014-10-16 NOTE — Assessment & Plan Note (Signed)
She agrees to get thyroid scan (to evaluate subclinical hyperthyroidism) and DEXA scan.  I do not want to refer her for too many things at once so will refer for mammo and colonoscopy at next visit.  She declines Zostavax.

## 2014-10-16 NOTE — Assessment & Plan Note (Addendum)
BP Readings from Last 3 Encounters:  10/15/14 124/88  07/23/14 123/60  07/18/14 128/81    Lab Results  Component Value Date   NA 142 06/25/2014   K 3.5 06/25/2014   CREATININE 0.79 06/25/2014    Assessment: Blood pressure control:  well controlled Progress toward BP goal:   at goal  Plan: Medications:  continue current medications: lisinopril 20mg  daily, HCTZ 12.5mg  daily Educational resources provided: brochure (does not need information) Other plans: BMP next visit

## 2014-10-16 NOTE — Progress Notes (Signed)
Internal Medicine Clinic Attending  Case discussed with Dr. Wilson soon after the resident saw the patient.  We reviewed the resident's history and exam and pertinent patient test results.  I agree with the assessment, diagnosis, and plan of care documented in the resident's note.  

## 2014-10-22 ENCOUNTER — Other Ambulatory Visit: Payer: Self-pay | Admitting: Internal Medicine

## 2014-10-22 DIAGNOSIS — E0590 Thyrotoxicosis, unspecified without thyrotoxic crisis or storm: Secondary | ICD-10-CM

## 2014-10-27 ENCOUNTER — Other Ambulatory Visit (INDEPENDENT_AMBULATORY_CARE_PROVIDER_SITE_OTHER): Payer: Medicare HMO

## 2014-10-27 DIAGNOSIS — E0590 Thyrotoxicosis, unspecified without thyrotoxic crisis or storm: Secondary | ICD-10-CM

## 2014-10-27 LAB — TSH: TSH: 0.016 u[IU]/mL — AB (ref 0.350–4.500)

## 2014-10-27 LAB — T4, FREE: FREE T4: 0.9 ng/dL (ref 0.80–1.80)

## 2014-10-27 LAB — T3, FREE: T3 FREE: 3.8 pg/mL (ref 2.3–4.2)

## 2014-11-05 ENCOUNTER — Encounter (HOSPITAL_COMMUNITY)
Admission: RE | Admit: 2014-11-05 | Discharge: 2014-11-05 | Disposition: A | Discharge status: Home / Self Care | Payer: Medicare HMO | Source: Ambulatory Visit | Attending: Internal Medicine | Admitting: Internal Medicine

## 2014-11-05 DIAGNOSIS — E0590 Thyrotoxicosis, unspecified without thyrotoxic crisis or storm: Secondary | ICD-10-CM

## 2014-11-05 MED ORDER — SODIUM IODIDE I 131 CAPSULE
6.2800 | Freq: Once | INTRAVENOUS | Status: AC | PRN
  Administered 2014-11-05: 6.28 via ORAL

## 2014-11-06 ENCOUNTER — Encounter (HOSPITAL_COMMUNITY)
Admission: RE | Admit: 2014-11-06 | Discharge: 2014-11-06 | Disposition: A | Discharge status: Home / Self Care | Payer: Medicare HMO | Source: Ambulatory Visit | Attending: Internal Medicine | Admitting: Internal Medicine

## 2014-11-06 DIAGNOSIS — E0590 Thyrotoxicosis, unspecified without thyrotoxic crisis or storm: Secondary | ICD-10-CM | POA: Diagnosis not present

## 2014-11-06 MED ORDER — SODIUM PERTECHNETATE TC 99M INJECTION
10.0000 | Freq: Once | INTRAVENOUS | Status: AC | PRN
  Administered 2014-11-06: 10 via INTRAVENOUS

## 2014-11-12 ENCOUNTER — Encounter: Payer: Medicare HMO | Admitting: Internal Medicine

## 2014-12-18 ENCOUNTER — Telehealth: Payer: Self-pay | Admitting: *Deleted

## 2014-12-18 NOTE — Telephone Encounter (Signed)
Call to patient message left for patient to call the Clinics.  Patient to schedule appointment for follow up of Thyroid per Dr. Andrey CampanileWilson.  Angelina OkGladys Graciella Arment, RN 12/18/2014 1:22 PM.

## 2014-12-22 ENCOUNTER — Other Ambulatory Visit: Payer: Self-pay | Admitting: Internal Medicine

## 2014-12-22 DIAGNOSIS — E0590 Thyrotoxicosis, unspecified without thyrotoxic crisis or storm: Secondary | ICD-10-CM

## 2014-12-24 ENCOUNTER — Ambulatory Visit (INDEPENDENT_AMBULATORY_CARE_PROVIDER_SITE_OTHER): Payer: PPO | Admitting: Internal Medicine

## 2014-12-24 ENCOUNTER — Encounter: Payer: Self-pay | Admitting: Internal Medicine

## 2014-12-24 VITALS — BP 122/79 | HR 81 | Temp 98.1°F | Wt 157.8 lb

## 2014-12-24 DIAGNOSIS — I10 Essential (primary) hypertension: Secondary | ICD-10-CM

## 2014-12-24 DIAGNOSIS — Z0000 Encounter for general adult medical examination without abnormal findings: Secondary | ICD-10-CM

## 2014-12-24 DIAGNOSIS — Z86010 Personal history of colonic polyps: Secondary | ICD-10-CM

## 2014-12-24 DIAGNOSIS — E559 Vitamin D deficiency, unspecified: Secondary | ICD-10-CM

## 2014-12-24 DIAGNOSIS — E785 Hyperlipidemia, unspecified: Secondary | ICD-10-CM

## 2014-12-24 DIAGNOSIS — E0590 Thyrotoxicosis, unspecified without thyrotoxic crisis or storm: Secondary | ICD-10-CM

## 2014-12-24 DIAGNOSIS — K219 Gastro-esophageal reflux disease without esophagitis: Secondary | ICD-10-CM

## 2014-12-24 LAB — T4, FREE: Free T4: 0.96 ng/dL (ref 0.80–1.80)

## 2014-12-24 LAB — LIPID PANEL
CHOL/HDL RATIO: 3 ratio
CHOLESTEROL: 222 mg/dL — AB (ref 0–200)
HDL: 75 mg/dL (ref >39)
LDL CALC: 134 mg/dL — AB (ref 0–99)
TRIGLYCERIDES: 66 mg/dL (ref <150)
VLDL: 13 mg/dL (ref 0–40)

## 2014-12-24 LAB — TSH: TSH: 0.012 u[IU]/mL — ABNORMAL LOW (ref 0.350–4.500)

## 2014-12-24 MED ORDER — METHIMAZOLE 5 MG PO TABS: 5.0000 mg | ORAL_TABLET | Freq: Three times a day (TID) | ORAL | Status: DC

## 2014-12-24 NOTE — Progress Notes (Signed)
Internal Medicine Clinic Attending  Case discussed with Dr. Rabbani soon after the resident saw the patient.  We reviewed the resident's history and exam and pertinent patient test results.  I agree with the assessment, diagnosis, and plan of care documented in the resident's note.  

## 2014-12-24 NOTE — Progress Notes (Signed)
Patient ID: Nicole Baxter, female   DOB: 03/12/1941, 74 y.o.   MRN: [id number]    Subjective:   Patient ID: Nicole Baxter female   DOB: 09/02/1941 74 y.o.   MRN: [id number]  HPI: Ms.[name] is a 74 y.o. very pleasant woman with past medical history of hypertension, hyperlipidemia, non-insulin dependent Type 2 DM, subclinical hyperthyroidism, and GERD who presents for follow-up of subclinical hyperthyroidism.   Her TSH first checked in January of 2015 was low at 0.139 with normal free T4 suggesting subclinical hyperthyroidism. Her last TSH level on 09/3014 was lower at 0.016 with normal free T3 and T4. 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 She reports feeling warm/sweating frequently  with dry skin and constipation but denies neck enlargement/tenderness, choking, dysphagia, dyspnea, voice hoarseness, eye pain, vision change, LE swelling, myalgias, paresis, anxiety, palpitations, vaginal bleeding, or weight loss. She reports having tremor of both of her hands which occurs at rest for the last year however per chart review was first noted April of 2012 and was seen by neurology thought to be essential tremor (mother had head tremor). She takes benadryl with some relief. She has not had recent fall/fracture or history of osteoporosis. She has not received DEXA scanning yet and is not currently on calcium/vitamin D supplementation. She does not have history of atrial fibrillation and has never been on amiodarone in the past.   She is noncompliant with taking lipitor for hyperlipidemia.   She is compliant with taking HCTZ and lisinopril for hypertension. She denies headache, blurry vision, cough, LE swelling, chest pain, or lightheadedness.   She is compliant with taking prilosec daily but takes it after eating breakfast. She has been having acid reflux symptoms, mostly at night.  She denies weight loss, melena, dysphagia, or odynophagia.   Her last colonosocpy was in 2011 and was found to have diverticuli and 2 sessile polyps that were removed. She was due for repeat screening in 2014. She denies dark or bloody stools.     Past Medical History  Diagnosis Date  . Obesity   . Hyperglycemia     isolated FBS 122, 95, 104 (Random 138)  . 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 GERD (gastroesophageal reflux disease)     controlled on prilosec  . Hyperlipidemia     on pravastatin  . Hypertension     controlled on 2 agents  . Tremor    Current Outpatient Prescriptions  Medication Sig Dispense Refill  . albuterol (PROAIR HFA) 108 (90 BASE) MCG/ACT inhaler Inhale 2 puffs into the lungs every 4 (four) hours as needed for wheezing. 3.7 g 3  . atorvastatin (LIPITOR) 80 MG tablet Take 1 tablet (80 mg total) by mouth daily. 90 tablet 1  . diphenhydrAMINE (SOMINEX) 25 MG tablet Take 25 mg by mouth at bedtime as needed for sleep.    Marland Kitchen. Fluticasone-Salmeterol (ADVAIR) 500-50 MCG/DOSE AEPB Inhale 1 puff into the lungs 2 (two) times daily as needed (Asthma).    . hydrochlorothiazide (HYDRODIURIL) 12.5 MG tablet Take 1 tablet (12.5 mg total) by mouth daily. 90 tablet 0  . lisinopril (PRINIVIL,ZESTRIL) 20 MG tablet Take 1 tablet (20 mg total) by mouth daily. 90 tablet 0  . omeprazole (PRILOSEC) 40 MG capsule Take 40 mg by mouth daily.      Marland Kitchen. zoster vaccine live, PF, (ZOSTAVAX) [id number]  UNT/0.65ML injection Inject 19,400 Units into the skin once. 1 each 0   No current facility-administered medications for this visit.   Family History  Problem Relation Age of Onset  . Heart disease Mother 74    died of MI @ 42  . Alcohol abuse Father   . Cancer Brother    History   Social History  . Marital Status: Married    Spouse Name: N/A    Number of Children: N/A  . Years of Education: N/A   Social History Main Topics  . Smoking status: Never Smoker   . Smokeless  tobacco: Not on file  . Alcohol Use: No  . Drug Use: No  . Sexual Activity: No   Other Topics Concern  . Not on file   Social History Narrative   Worked at [employer] 31 years. Married.Active, out and about q day.   5 children, 1 with sarcoidosis, 1 with COPD.   Review of Systems: Review of Systems  Constitutional: Positive for diaphoresis (occasionally ). Negative for fever, chills and weight loss.       Feeling warm  Eyes: Negative for blurred vision and pain.  Respiratory: Negative for cough, shortness of breath and wheezing.   Cardiovascular: Negative for chest pain, palpitations and leg swelling.  Gastrointestinal: Positive for heartburn and constipation. Negative for nausea, vomiting, abdominal pain, diarrhea, blood in stool and melena.  Genitourinary: Negative for dysuria, urgency, frequency and hematuria.  Musculoskeletal: Negative for myalgias and falls.  Neurological: Positive for tremors (b/l UE at rest). Negative for dizziness, focal weakness and headaches.  Psychiatric/Behavioral: The patient is not nervous/anxious.     Objective:  Physical Exam: Filed Vitals:   12/24/14 0912  BP: 122/79  Pulse: 81  Temp: 98.1 F (36.7 C)  TempSrc: Oral  Weight: 157 lb 12.8 oz (71.578 kg)  SpO2: 99%    Physical Exam  Constitutional: She is oriented to person, place, and time. She appears well-developed and well-nourished. No distress.  HENT:  Head: Normocephalic and atraumatic.  Right Ear: External ear normal.  Left Ear: External ear normal.  Nose: Nose normal.  Mouth/Throat: Oropharynx is clear and moist. No oropharyngeal exudate.  Eyes: Conjunctivae and EOM are normal. Pupils are equal, round, and reactive to light. Right eye exhibits no discharge. Left eye exhibits no discharge. No scleral icterus.  No exophthalmus or lid lag.   Neck: Normal range of motion. Neck supple. No thyromegaly (No neck tenderness) present.  No thyroid nodule palpated  Cardiovascular: Normal  rate, regular rhythm and normal heart sounds.   Pulmonary/Chest: Effort normal and breath sounds normal. No respiratory distress. She has no wheezes. She has no rales.  Abdominal: Bowel sounds are normal. She exhibits no distension. There is no tenderness. There is no rebound and no guarding.  Musculoskeletal: Normal range of motion. She exhibits no edema or tenderness.  Neurological: She is alert and oriented to person, place, and time. She has normal reflexes. She displays normal reflexes. No cranial nerve deficit. She exhibits normal muscle tone. Coordination normal.  Normal 5/5 muscle strength. Normal sensation to light touch. Normal finger to nose testing. Bilateral hand tremor at rest, resolved with action. No pill rolling.   Skin: Skin is warm and dry. No rash noted. She is not diaphoretic. No erythema. No pallor.  Psychiatric: She has a normal mood and affect. Her behavior is normal. Judgment and thought content normal.    Assessment & Plan:   Please see problem list for problem-based  assessment and plan

## 2014-12-24 NOTE — Patient Instructions (Signed)
-Will check your bloodwork today -Start taking methimazole 5 mg every 8 hrs (three times daily) for your thyroid that is overactive  -Will call you for your bone density scan and refer you for colonoscopy -Take prilosec 1-2 hrs before breakfast for acid reflux  -Please come back in 6 weeks to recheck your thyroid, nice meeting you today!   Hyperthyroidism The thyroid is a large gland located in the lower front part of your neck. The thyroid helps control metabolism. Metabolism is how your body uses food. It controls metabolism with the hormone thyroxine. When the thyroid is overactive, it produces too much hormone. When this happens, these following problems may occur:   Nervousness  Heat intolerance  Weight loss (in spite of increase food intake)  Diarrhea  Change in hair or skin texture  Palpitations (heart skipping or having extra beats)  Tachycardia (rapid heart rate)  Loss of menstruation (amenorrhea)  Shaking of the hands CAUSES  Grave's Disease (the immune system attacks the thyroid gland). This is the most common cause.  Inflammation of the thyroid gland.  Tumor (usually benign) in the thyroid gland or elsewhere.  Excessive use of thyroid medications (both prescription and 'natural').  Excessive ingestion of Iodine. DIAGNOSIS  To prove hyperthyroidism, your caregiver may do blood tests and ultrasound tests. Sometimes the signs are hidden. It may be necessary for your caregiver to watch this illness with blood tests, either before or after diagnosis and treatment. TREATMENT Short-term treatment There are several treatments to control symptoms. Drugs called beta blockers may give some relief. Drugs that decrease hormone production will provide temporary relief in many people. These measures will usually not give permanent relief. Definitive therapy There are treatments available which can be discussed between you and your caregiver which will permanently treat the  problem. These treatments range from surgery (removal of the thyroid), to the use of radioactive iodine (destroys the thyroid by radiation), to the use of antithyroid drugs (interfere with hormone synthesis). The first two treatments are permanent and usually successful. They most often require hormone replacement therapy for life. This is because it is impossible to remove or destroy the exact amount of thyroid required to make a person euthyroid (normal). HOME CARE INSTRUCTIONS  See your caregiver if the problems you are being treated for get worse. Examples of this would be the problems listed above. SEEK MEDICAL CARE IF: Your general condition worsens. MAKE SURE YOU:   Understand these instructions.  Will watch your condition.  Will get help right away if you are not doing well or get worse. Document Released: 11/14/2005 Document Revised: 02/06/2012 Document Reviewed: 03/28/2007 [facility] Patient Information 2015 [place], [place]. This information is not intended to replace advice given to you by your health care provider. Make sure you discuss any questions you have with your health care provider.    General Instructions:   Please bring your medicines with you each time you come to clinic.  Medicines may include prescription medications, over-the-counter medications, herbal remedies, eye drops, vitamins, or other pills.   Progress Toward Treatment Goals:  Treatment Goal 07/23/2014  Hemoglobin A1C at goal  Blood pressure at goal    Self Care Goals & Plans:  Self Care Goal 10/15/2014  Manage my medications take my medicines as prescribed; bring my medications to every visit; refill my medications on time  Eat healthy foods drink diet soda or water instead of juice or soda; eat more vegetables; eat foods that are low in salt; eat baked  foods instead of fried foods; eat fruit for snacks and desserts  Meeting treatment goals -    Home Blood Glucose Monitoring 03/18/2014  Check my  blood sugar no home glucose monitoring  When to check my blood sugar N/A     Care Management & Community Referrals:  Referral 03/18/2014  Referrals made for care management support none needed  Referrals made to community resources none

## 2014-12-25 DIAGNOSIS — E559 Vitamin D deficiency, unspecified: Secondary | ICD-10-CM | POA: Insufficient documentation

## 2014-12-25 DIAGNOSIS — Z86010 Personal history of colonic polyps: Secondary | ICD-10-CM | POA: Insufficient documentation

## 2014-12-25 LAB — VITAMIN D 25 HYDROXY (VIT D DEFICIENCY, FRACTURES): Vit D, 25-Hydroxy: 9 ng/mL — ABNORMAL LOW (ref 30–100)

## 2014-12-25 LAB — THYROID PEROXIDASE ANTIBODY: Thyroperoxidase Ab SerPl-aCnc: 1 IU/mL (ref <9)

## 2014-12-25 MED ORDER — VITAMIN D (ERGOCALCIFEROL) 1.25 MG (50000 UNIT) PO CAPS: 50000.0000 [IU] | ORAL_CAPSULE | ORAL | Status: DC

## 2014-12-25 NOTE — Assessment & Plan Note (Signed)
Assessment: Pt with history of GERD compliant with PPI therapy who presents with persistent acid reflux symptoms most likely due to inadequate timing of therapy with no alarm symptoms.   Plan:  -Pt instructed to take omeprazole 20 mg 1-2 hrs before breakfast -Consider BID dosing if symptoms do not improve   -Monitor for alarm symptoms warranting EGD

## 2014-12-25 NOTE — Assessment & Plan Note (Signed)
-  Pt declined screening mammogram at this visit, inquire at next visit.  -Referral placed to GI for screening colonoscopy  -Annual diabetic foot exam at next visit with PCP

## 2014-12-25 NOTE — Assessment & Plan Note (Signed)
Assessment: Pt with last lipid on on 12/04/13 with hypercholesteremia noncompliant with high intensity statin therapy with calculated 10-yr ASCVD risk of 28.8% with recommendations to continue moderate to high intensity statin therapy.   Plan:  -Obtain annual lipid panel ---> LDL worsened from 123 to 134 with stable total cholesterol at 222 -Pt instructed to continue atorvastatin 80 mg daily  -Monitor for myalgias  -Obtain CMP

## 2014-12-25 NOTE — Addendum Note (Signed)
Addended byOtis Brace: Vedika Dumlao on: 12/25/2014 04:43 AM   Modules accepted: Orders

## 2014-12-25 NOTE — Assessment & Plan Note (Addendum)
Assessment: Pt with history of subclinical hyperthyroidism since January of 2015 with last TSH 0.016 on 10/27/14 with normal free T4 level and thyroid uptake scan revealing possible Grave's disease vs MNG not on medical therapy who presents with skin changes, bilateral hand tremor, change in BM, and heat intolerance.   Plan:  -Prescribe methimazole 5 mg TID  -Obtain TSH ---> reduced from 0.016 to 0.012 -Obtain free T4 level ----> remains normal  -Obtain CMP, CBC w/dif, thyroid peroxide antibody, and TSI  -Obtain vitamin 25-OH level -DEXA scan to be scheduled  -Endocrinology referral in progress -Pt to return in 6 weeks for TSH recheck  ADDENDUM: Pt with vitamin D deficiency. Will start ergocalciferol 50K U weekly for 8 weeks.

## 2014-12-25 NOTE — Assessment & Plan Note (Signed)
Assessment: Pt with well-controlled hypertension compliant with two-class (diuretic & ACEi) anti-hypertensive therapy who presents with blood pressure of 122/79.   Plan:  -BP 122/79 at goal <140/90 -Continue HCTZ 12.5 mg daily and lisinopril 20 mg daily  -Obtain CMP

## 2014-12-25 NOTE — Assessment & Plan Note (Signed)
Pt with history of 2 sessile colon polyps that were removed in 2011, refer to GI for 3-year colonoscopy which she is overdue for.

## 2014-12-26 ENCOUNTER — Other Ambulatory Visit (INDEPENDENT_AMBULATORY_CARE_PROVIDER_SITE_OTHER): Payer: PPO

## 2014-12-26 DIAGNOSIS — E0590 Thyrotoxicosis, unspecified without thyrotoxic crisis or storm: Secondary | ICD-10-CM

## 2014-12-26 DIAGNOSIS — E02 Subclinical iodine-deficiency hypothyroidism: Secondary | ICD-10-CM

## 2014-12-26 LAB — COMPLETE METABOLIC PANEL WITH GFR
ALBUMIN: 3.5 g/dL (ref 3.5–5.2)
ALT: <8 U/L (ref 0–35)
AST: 12 U/L (ref 0–37)
Alkaline Phosphatase: 108 U/L (ref 39–117)
BUN: 12 mg/dL (ref 6–23)
CALCIUM: 9.1 mg/dL (ref 8.4–10.5)
CHLORIDE: 107 meq/L (ref 96–112)
CO2: 26 mEq/L (ref 19–32)
CREATININE: 0.79 mg/dL (ref 0.50–1.10)
GFR, EST AFRICAN AMERICAN: 87 mL/min
GFR, Est Non African American: 76 mL/min
Glucose, Bld: 97 mg/dL (ref 70–99)
POTASSIUM: 3.8 meq/L (ref 3.5–5.3)
SODIUM: 143 meq/L (ref 135–145)
TOTAL PROTEIN: 6.5 g/dL (ref 6.0–8.3)
Total Bilirubin: 0.8 mg/dL (ref 0.2–1.2)

## 2014-12-26 LAB — CBC WITH DIFFERENTIAL/PLATELET
BASOS ABS: 0.1 10*3/uL (ref 0.0–0.1)
BASOS PCT: 1 % (ref 0–1)
EOS PCT: 9 % — AB (ref 0–5)
Eosinophils Absolute: 0.5 10*3/uL (ref 0.0–0.7)
HEMATOCRIT: 37.3 % (ref 36.0–46.0)
Hemoglobin: 12.7 g/dL (ref 12.0–15.0)
LYMPHS ABS: 2.3 10*3/uL (ref 0.7–4.0)
LYMPHS PCT: 38 % (ref 12–46)
MCH: 30.4 pg (ref 26.0–34.0)
MCHC: 34 g/dL (ref 30.0–36.0)
MCV: 89.2 fL (ref 78.0–100.0)
MPV: 9.5 fL (ref 8.6–12.4)
Monocytes Absolute: 0.3 10*3/uL (ref 0.1–1.0)
Monocytes Relative: 5 % (ref 3–12)
NEUTROS ABS: 2.8 10*3/uL (ref 1.7–7.7)
Neutrophils Relative %: 47 % (ref 43–77)
Platelets: 197 10*3/uL (ref 150–400)
RBC: 4.18 MIL/uL (ref 3.87–5.11)
RDW: 13.6 % (ref 11.5–15.5)
WBC: 6 10*3/uL (ref 4.0–10.5)

## 2014-12-29 ENCOUNTER — Encounter: Payer: Self-pay | Admitting: Gastroenterology

## 2014-12-29 LAB — THYROID STIMULATING IMMUNOGLOBULIN: TSI: 23 % baseline (ref <140)

## 2014-12-31 ENCOUNTER — Encounter: Payer: Self-pay | Admitting: *Deleted

## 2015-01-28 ENCOUNTER — Encounter: Payer: Self-pay | Admitting: Internal Medicine

## 2015-01-28 ENCOUNTER — Other Ambulatory Visit: Payer: Self-pay | Admitting: Internal Medicine

## 2015-01-28 ENCOUNTER — Ambulatory Visit (INDEPENDENT_AMBULATORY_CARE_PROVIDER_SITE_OTHER): Payer: PPO | Admitting: Internal Medicine

## 2015-01-28 VITALS — BP 109/63 | HR 87 | Temp 97.9°F | Ht 61.5 in | Wt 160.6 lb

## 2015-01-28 DIAGNOSIS — E559 Vitamin D deficiency, unspecified: Secondary | ICD-10-CM

## 2015-01-28 DIAGNOSIS — I10 Essential (primary) hypertension: Secondary | ICD-10-CM

## 2015-01-28 DIAGNOSIS — E0590 Thyrotoxicosis, unspecified without thyrotoxic crisis or storm: Secondary | ICD-10-CM

## 2015-01-28 DIAGNOSIS — E119 Type 2 diabetes mellitus without complications: Secondary | ICD-10-CM

## 2015-01-28 DIAGNOSIS — J45909 Unspecified asthma, uncomplicated: Secondary | ICD-10-CM

## 2015-01-28 DIAGNOSIS — Z5181 Encounter for therapeutic drug level monitoring: Secondary | ICD-10-CM

## 2015-01-28 DIAGNOSIS — K219 Gastro-esophageal reflux disease without esophagitis: Secondary | ICD-10-CM

## 2015-01-28 DIAGNOSIS — E785 Hyperlipidemia, unspecified: Secondary | ICD-10-CM

## 2015-01-28 DIAGNOSIS — M170 Bilateral primary osteoarthritis of knee: Secondary | ICD-10-CM

## 2015-01-28 DIAGNOSIS — G252 Other specified forms of tremor: Secondary | ICD-10-CM

## 2015-01-28 DIAGNOSIS — R259 Unspecified abnormal involuntary movements: Secondary | ICD-10-CM

## 2015-01-28 DIAGNOSIS — Z0000 Encounter for general adult medical examination without abnormal findings: Secondary | ICD-10-CM

## 2015-01-28 DIAGNOSIS — R258 Other abnormal involuntary movements: Secondary | ICD-10-CM

## 2015-01-28 LAB — TSH: TSH: 4.512 u[IU]/mL — ABNORMAL HIGH (ref 0.350–4.500)

## 2015-01-28 LAB — CBC WITH DIFFERENTIAL/PLATELET
Basophils Absolute: 0.1 10*3/uL (ref 0.0–0.1)
Basophils Relative: 1 % (ref 0–1)
EOS ABS: 0.6 10*3/uL (ref 0.0–0.7)
Eosinophils Relative: 11 % — ABNORMAL HIGH (ref 0–5)
HEMATOCRIT: 39.1 % (ref 36.0–46.0)
HEMOGLOBIN: 13.1 g/dL (ref 12.0–15.0)
LYMPHS ABS: 2 10*3/uL (ref 0.7–4.0)
Lymphocytes Relative: 35 % (ref 12–46)
MCH: 30.3 pg (ref 26.0–34.0)
MCHC: 33.5 g/dL (ref 30.0–36.0)
MCV: 90.3 fL (ref 78.0–100.0)
MONO ABS: 0.3 10*3/uL (ref 0.1–1.0)
MONOS PCT: 6 % (ref 3–12)
MPV: 9.6 fL (ref 8.6–12.4)
Neutro Abs: 2.7 10*3/uL (ref 1.7–7.7)
Neutrophils Relative %: 47 % (ref 43–77)
Platelets: 246 10*3/uL (ref 150–400)
RBC: 4.33 MIL/uL (ref 3.87–5.11)
RDW: 14.5 % (ref 11.5–15.5)
WBC: 5.8 10*3/uL (ref 4.0–10.5)

## 2015-01-28 LAB — POCT GLYCOSYLATED HEMOGLOBIN (HGB A1C): HEMOGLOBIN A1C: 5.6

## 2015-01-28 LAB — T4, FREE: Free T4: 0.67 ng/dL — ABNORMAL LOW (ref 0.80–1.80)

## 2015-01-28 LAB — T3, FREE: T3 FREE: 2.5 pg/mL (ref 2.3–4.2)

## 2015-01-28 LAB — GLUCOSE, CAPILLARY: Glucose-Capillary: 127 mg/dL — ABNORMAL HIGH (ref 70–99)

## 2015-01-28 MED ORDER — GABAPENTIN 100 MG PO CAPS: 100.0000 mg | ORAL_CAPSULE | Freq: Three times a day (TID) | ORAL | Status: DC

## 2015-01-28 MED ORDER — HYDROCHLOROTHIAZIDE 12.5 MG PO TABS: 12.5000 mg | ORAL_TABLET | Freq: Every day | ORAL | Status: DC

## 2015-01-28 MED ORDER — LISINOPRIL 20 MG PO TABS: 20.0000 mg | ORAL_TABLET | Freq: Every day | ORAL | Status: DC

## 2015-01-28 NOTE — Progress Notes (Addendum)
   Subjective:    Patient ID: Nicole Baxter, female    DOB: 02/13/1941, 74 y.o.   MRN: [id number]  HPI Comments: [name] is a 74 year old woman with a PMH of HTN , DM type 2 (diet controlled, Hgb A1c 5.02 Oct 2014), knee OA, essential tremor and subclinical hyperthyroidism here for routine follow-up.  Please see problem based charting for updates.      Review of Systems  Constitutional: Negative for fever, chills, activity change, appetite change and fatigue.  Eyes: Negative for visual disturbance.  Respiratory: Negative for cough and shortness of breath.   Cardiovascular: Negative for chest pain, palpitations and leg swelling.  Gastrointestinal: Negative for nausea, vomiting, abdominal pain, diarrhea, constipation and blood in stool.  Endocrine: Negative for cold intolerance, heat intolerance, polydipsia, polyphagia and polyuria.  Genitourinary: Negative for dysuria and hematuria.  Neurological: Negative for syncope, weakness and light-headedness.  Psychiatric/Behavioral: Negative for dysphoric mood.       Filed Vitals:   01/28/15 1353  BP: 109/63  Pulse: 87  Temp: 97.9 F (36.6 C)  TempSrc: Oral  Height: 5' 1.5" (1.562 m)  Weight: 160 lb 9.6 oz (72.848 kg)  SpO2: 98%   Objective:   Physical Exam  Constitutional: She is oriented to person, place, and time. She appears well-developed. No distress.  HENT:  Head: Normocephalic and atraumatic.  Mouth/Throat: Oropharynx is clear and moist. No oropharyngeal exudate.  Eyes: EOM are normal. Pupils are equal, round, and reactive to light. No scleral icterus.  Neck: Neck supple. No thyromegaly present.  Cardiovascular: Normal rate, regular rhythm, normal heart sounds and intact distal pulses.  Exam reveals no gallop and no friction rub.   No murmur heard. Pulmonary/Chest: Effort normal and breath sounds normal. No respiratory distress. She has no wheezes. She has no rales.  Abdominal: Soft. Bowel sounds are normal. She exhibits no  distension. There is no tenderness. There is no rebound.  Musculoskeletal: She exhibits no edema or tenderness.  + mild rigidity of upper extremities; lower extremities with normal tone  Neurological: She is alert and oriented to person, place, and time. No cranial nerve deficit.  Resting tremor of B/L forearms/hands that resolves with intentional movement.  She is able to ambulate with assistance of cane; gait is slow.  Sensation is normal upper and lower extremities, including feet.  Skin: Skin is warm. She is not diaphoretic.  Psychiatric: She has a normal mood and affect. Her behavior is normal.  Vitals reviewed.         Assessment & Plan:  Please see problem based assessment and plan.

## 2015-01-28 NOTE — Assessment & Plan Note (Signed)
Controlled with OTC Prilosec.

## 2015-01-28 NOTE — Assessment & Plan Note (Signed)
Colonoscopy:  Scheduled for 03/04/15 DEXA:  Scheduled for 02/05/15 Mammo:  Will wait to refer at next visit since she has so many appointments coming up. Zostavax:  Patient was given rx for shingles vaccine at prior visit but reports that insurance co-pay was $200 so she could not afford it.  She now has new insurance company so will attempt rx again at future visit.

## 2015-01-28 NOTE — Progress Notes (Signed)
Internal Medicine Clinic Attending  Case discussed with Dr. Wilson soon after the resident saw the patient.  We reviewed the resident's history and exam and pertinent patient test results.  I agree with the assessment, diagnosis, and plan of care documented in the resident's note.  

## 2015-01-28 NOTE — Patient Instructions (Signed)
General Instructions: 1. I will call you if there are any lab abnormalities.  Otherwise, please continue to take methimazole three times per day.  I have referred you to endocrinology (thyroid specialist) but it may take a few months to get an appointment.     2. Please take all medications as prescribed.   I have prescribed a new medication to help with you tremor.  It is called gabapentin and you take it three times per day.  Please call me if you have any problems or experience side effects from this medication.  It may make you drowsy.   3. If you have worsening of your symptoms or new symptoms arise, please call the clinic [phone]), or go to the ER immediately if symptoms are severe.  If you develop sore throat or fever while taking methimazole please call for a clinic appointment immediately.  Treatment Goals:  Goals (1 Years of Data) as of 01/28/15          As of Today 12/24/14 10/15/14 07/23/14 07/18/14     Blood Pressure   . Blood Pressure < 140/90  109/63 122/79 124/88 123/60 128/81     Exercise   . Exercise 3x per week (30 min per time)            Progress Toward Treatment Goals:  Treatment Goal 01/28/2015  Hemoglobin A1C at goal  Blood pressure at goal    Self Care Goals & Plans:  Self Care Goal 01/28/2015  Manage my medications take my medicines as prescribed; bring my medications to every visit; refill my medications on time  Eat healthy foods drink diet soda or water instead of juice or soda; eat more vegetables; eat foods that are low in salt; eat baked foods instead of fried foods  Meeting treatment goals -    Home Blood Glucose Monitoring 01/28/2015  Check my blood sugar no home glucose monitoring  When to check my blood sugar N/A     Care Management & Community Referrals:  Referral 03/18/2014  Referrals made for care management support none needed  Referrals made to community resources none     Gabapentin capsules or tablets What is this  medicine? GABAPENTIN (GA ba pen tin) is used to control partial seizures in adults with epilepsy. It is also used to treat certain types of nerve pain. This medicine may be used for other purposes; ask your health care provider or pharmacist if you have questions. COMMON BRAND NAME(S): Ascencion DikeGabarone, Neurontin What should I tell my health care provider before I take this medicine? They need to know if you have any of these conditions: -kidney disease -suicidal thoughts, plans, or attempt; a previous suicide attempt by you or a family member -an unusual or allergic reaction to gabapentin, other medicines, foods, dyes, or preservatives -pregnant or trying to get pregnant -breast-feeding How should I use this medicine? Take this medicine by mouth with a glass of water. Follow the directions on the prescription label. You can take it with or without food. If it upsets your stomach, take it with food.Take your medicine at regular intervals. Do not take it more often than directed. Do not stop taking except on your doctor's advice. If you are directed to break the 600 or 800 mg tablets in half as part of your dose, the extra half tablet should be used for the next dose. If you have not used the extra half tablet within 28 days, it should be thrown away. A special MedGuide  will be given to you by the pharmacist with each prescription and refill. Be sure to read this information carefully each time. Talk to your pediatrician regarding the use of this medicine in children. Special care may be needed. Overdosage: If you think you have taken too much of this medicine contact a poison control center or emergency room at once. NOTE: This medicine is only for you. Do not share this medicine with others. What if I miss a dose? If you miss a dose, take it as soon as you can. If it is almost time for your next dose, take only that dose. Do not take double or extra doses. What may interact with this medicine? Do not  take this medicine with any of the following medications: -other gabapentin products This medicine may also interact with the following medications: -alcohol -antacids -antihistamines for allergy, cough and cold -certain medicines for anxiety or sleep -certain medicines for depression or psychotic disturbances -homatropine; hydrocodone -naproxen -narcotic medicines (opiates) for pain -phenothiazines like chlorpromazine, mesoridazine, prochlorperazine, thioridazine This list may not describe all possible interactions. Give your health care provider a list of all the medicines, herbs, non-prescription drugs, or dietary supplements you use. Also tell them if you smoke, drink alcohol, or use illegal drugs. Some items may interact with your medicine. What should I watch for while using this medicine? Visit your doctor or health care professional for regular checks on your progress. You may want to keep a record at home of how you feel your condition is responding to treatment. You may want to share this information with your doctor or health care professional at each visit. You should contact your doctor or health care professional if your seizures get worse or if you have any new types of seizures. Do not stop taking this medicine or any of your seizure medicines unless instructed by your doctor or health care professional. Stopping your medicine suddenly can increase your seizures or their severity. Wear a medical identification bracelet or chain if you are taking this medicine for seizures, and carry a card that lists all your medications. You may get drowsy, dizzy, or have blurred vision. Do not drive, use machinery, or do anything that needs mental alertness until you know how this medicine affects you. To reduce dizzy or fainting spells, do not sit or stand up quickly, especially if you are an older patient. Alcohol can increase drowsiness and dizziness. Avoid alcoholic drinks. Your mouth may get  dry. Chewing sugarless gum or sucking hard candy, and drinking plenty of water will help. The use of this medicine may increase the chance of suicidal thoughts or actions. Pay special attention to how you are responding while on this medicine. Any worsening of mood, or thoughts of suicide or dying should be reported to your health care professional right away. Women who become pregnant while using this medicine may enroll in the [place] Antiepileptic Drug Pregnancy Registry by calling [id number]. This registry collects information about the safety of antiepileptic drug use during pregnancy. What side effects may I notice from receiving this medicine? Side effects that you should report to your doctor or health care professional as soon as possible: -allergic reactions like skin rash, itching or hives, swelling of the face, lips, or tongue -worsening of mood, thoughts or actions of suicide or dying Side effects that usually do not require medical attention (report to your doctor or health care professional if they continue or are bothersome): -constipation -difficulty walking or controlling muscle  movements -dizziness -nausea -slurred speech -tiredness -tremors -weight gain This list may not describe all possible side effects. Call your doctor for medical advice about side effects. You may report side effects to FDA at 1-800-FDA-1088. Where should I keep my medicine? Keep out of reach of children. Store at room temperature between 15 and 30 degrees C (59 and 86 degrees F). Throw away any unused medicine after the expiration date. NOTE: This sheet is a summary. It may not cover all possible information. If you have questions about this medicine, talk to your doctor, pharmacist, or health care provider.  2015, Elsevier/Gold Standard. (2013-07-18 09:12:48)  Methimazole tablets What is this medicine? METHIMAZOLE (meth IM a zole) prevents the thyroid gland from producing too much  thyroid hormone. It is used to treat a condition known as hyperthyroidism. This medicine may be used for other purposes; ask your health care provider or pharmacist if you have questions. COMMON BRAND NAME(S): Northyx, Tapazole What should I tell my health care provider before I take this medicine? They need to know if you have any of these conditions: -bone marrow disease -liver disease -an unusual or allergic reaction to methimazole, other medicines, foods, dyes, or preservatives -pregnant or trying to get pregnant -breast-feeding How should I use this medicine? Take this medicine by mouth with a glass of water. Follow the directions on the prescription label. You can take this medicine with or without food. However, you should always take it the same way to make sure the effects are the same. Take your doses at regular intervals. Do not take your medicine more often than directed. Do not stop taking this medicine except on the advice of your doctor or health care professional. Talk to your pediatrician regarding the use of this medicine in children. Special care may be needed. While this drug may be prescribed for children for selected conditions, precautions do apply. Overdosage: If you think you have taken too much of this medicine contact a poison control center or emergency room at once. NOTE: This medicine is only for you. Do not share this medicine with others. What if I miss a dose? If you miss a dose, take it as soon as you can. If it is almost time for your next dose, take only that dose. Do not take double or extra doses. What may interact with this medicine? Do not take this medicine with any of the following medications: -sodium iodide -thyroid hormones This medicine may also interact with the following medications: -certain medicines for high blood pressure like metoprolol and propranolol -digoxin -theophylline -warfarin This list may not describe all possible interactions.  Give your health care provider a list of all the medicines, herbs, non-prescription drugs, or dietary supplements you use. Also tell them if you smoke, drink alcohol, or use illegal drugs. Some items may interact with your medicine. What should I watch for while using this medicine? Visit your doctor or health care professional for regular checks on your progress. Your thyroid hormone levels will need to be checked. This medicine can reduce your resistance to infection. Contact your doctor or health care professional if you have any infection or injury. Avoid people who have colds, flu, bronchitis or other infectious disease. Do not have any vaccinations without asking your doctor or health care professional. Avoid people who have recently received oral polio vaccine. What side effects may I notice from receiving this medicine? Side effects that you should report to your doctor or health care professional as soon  as possible: -black, tarry stools -fever, sore throat, hoarseness -numbness or tingling in the hands or feet -severe redness or itching of the skin, or dry cracked skin -stomach pain -swelling of the feet or legs -unusual bleeding or bruising, pinpoint red spots on the skin -unusual or sudden weight increase -unusually weak or tired -yellowing of skin or eyes Side effects that usually do not require medical attention (report to your doctor or health care professional if they continue or are bothersome): -headache -nausea, vomiting -mild skin rash, itching -muscle aches and pains This list may not describe all possible side effects. Call your doctor for medical advice about side effects. You may report side effects to FDA at 1-800-FDA-1088. Where should I keep my medicine? Keep out of the reach of children. Store at room temperature between 15 and 30 degrees C (59 and 86 degrees F). Throw away any unused medicine after the expiration date. NOTE: This sheet is a summary. It may not  cover all possible information. If you have questions about this medicine, talk to your doctor, pharmacist, or health care provider.  2015, Elsevier/Gold Standard. (2008-06-02 15:59:43)

## 2015-01-28 NOTE — Assessment & Plan Note (Addendum)
She is compliant with methimazole TID and reports no ADRs.  She is asymptomatic (tremor predates abnormal TSH levels and does not seem related).  I explained that she may feel well but overactive thyroid increases her cardiovascular disease risk and decreases bone density.  She agrees to continue medications and follow-up with endocrinology.  The endocrinology office [facility] was unable to reach [facility].  She says she did not receive the letter.  I have printed the letter with the office phone number and asked that she call and make an appointment so she can discuss long term management with a specialist.   - check TSH, free T3 and T4 (TSH may lag behind treatment) - continue methimazole; if necessary, adjust dose depending on today's lab results - DEXA was ordered incorrectly; re-ordered and scheduled for 02/05/15  - the patient and her husband agree to call and arrange Endocrine appointment when they get home today.  I explained the importance of calling soon because the appointment may be months away. - RTC in 1 month for monitoring/labs

## 2015-01-28 NOTE — Assessment & Plan Note (Addendum)
She continues to take benadryl for tremor.  She says she was told to do so by a Neurologist she saw years ago.  I advised she stop Benadryl due to risk of anticholinergic ADRs in elderly.  She is agreeable and willing to try another agent.  + rest tremor that improves with movement.  She seems more rigid on exam today.  I asked her to show me how she walks.  She was able to ambulate very slowly with her cane.  She says sometimes her feet get stuck.  Both she and her husband agree her walking has gotten worse.   Neuro note from [facility] in 2012 suggest that resting tremor is not c/w benign essential tremor and that Parkinson's should be considered, however she was having improvement in symptoms which did not support dx of PD.  Their recs at that time were for Benadryl, brain MRI and follow-up in 4 months.  It does not look like she returned for follow-up.  At this time, her slow gait, upper extremity rigidity and rest tremor are concerning for Parkinson's.   - Stop Benadryl - Start Gabapentin 100mg  TID (I cannot use propranolol due to her asthma) and see if it helps her tremor; she will call me if she does not tolerate it - will refer to Neurology at next visit in 19month since I she already has upcoming appt for DEXA, colonoscopy and will making Endocrinology appt.  Also, she seems a little resistant to so many appt so I think it will be easier for follow through if we focus more on the tremor next visit and make referral at that time.  - RTC in 19month

## 2015-01-28 NOTE — Assessment & Plan Note (Signed)
Lab Results  Component Value Date   HGBA1C 5.6 01/28/2015   HGBA1C 5.5 10/15/2014   HGBA1C 5.6 06/25/2014     Assessment: Diabetes control: good control (HgbA1C at goal) Progress toward A1C goal:  at goal  Plan: Medications: none, diet controlled Home glucose monitoring: Frequency: no home glucose monitoring Timing: N/A Instruction/counseling given: discussed diet Educational resources provided: brochure (denies) Self management tools provided:   Other plans: RTC in 3 months.

## 2015-01-28 NOTE — Assessment & Plan Note (Signed)
The patient has not take her Lipitor in some time because she is afraid of ADRs.  Will d/c for now because she is not willing to take it.  Will attempt to re-address at future visit.

## 2015-01-28 NOTE — Assessment & Plan Note (Signed)
Stable.  No increased inhaler use.

## 2015-01-28 NOTE — Assessment & Plan Note (Addendum)
BP Readings from Last 3 Encounters:  01/28/15 109/63  12/24/14 122/79  10/15/14 124/88    Lab Results  Component Value Date   NA 143 12/26/2014   K 3.8 12/26/2014   CREATININE 0.79 12/26/2014    Assessment: Blood pressure control:  well controlled Progress toward BP goal:   at goal Comments: compliant with medications  Plan: Medications:  continue current medications:  HCTZ 12.5mg  daily and lisinopril 20mg  daily. Educational resources provided: brochure (denies) Self management tools provided:   Other plans: RTC in 3 months

## 2015-01-28 NOTE — Assessment & Plan Note (Signed)
She is compliant with vitamin D and understands not to take any other OTC supplement while on high dose weekly vitamin D.  She will start OTC vitamin D once she completes course.  DEXA on 02/05/15.

## 2015-01-28 NOTE — Assessment & Plan Note (Signed)
Pain controlled with Tylenol arthritis.  She reports taking 1 per day.

## 2015-01-29 LAB — COMPLETE METABOLIC PANEL WITH GFR
ALBUMIN: 3.8 g/dL (ref 3.5–5.2)
ALK PHOS: 189 U/L — AB (ref 39–117)
ALT: 125 U/L — ABNORMAL HIGH (ref 0–35)
AST: 146 U/L — AB (ref 0–37)
BUN: 17 mg/dL (ref 6–23)
CALCIUM: 9.1 mg/dL (ref 8.4–10.5)
CO2: 24 mEq/L (ref 19–32)
CREATININE: 0.85 mg/dL (ref 0.50–1.10)
Chloride: 108 mEq/L (ref 96–112)
GFR, Est African American: 80 mL/min
GFR, Est Non African American: 69 mL/min
Glucose, Bld: 72 mg/dL (ref 70–99)
POTASSIUM: 3.5 meq/L (ref 3.5–5.3)
Sodium: 143 mEq/L (ref 135–145)
Total Bilirubin: 1.2 mg/dL (ref 0.2–1.2)
Total Protein: 7.2 g/dL (ref 6.0–8.3)

## 2015-01-29 NOTE — Addendum Note (Signed)
Addended by: Neomia DearPOWERS, [place] Graefe E on: 01/29/2015 12:32 PM   Modules accepted: Orders

## 2015-01-30 ENCOUNTER — Other Ambulatory Visit: Payer: Self-pay | Admitting: Internal Medicine

## 2015-01-30 DIAGNOSIS — E0590 Thyrotoxicosis, unspecified without thyrotoxic crisis or storm: Secondary | ICD-10-CM

## 2015-02-03 ENCOUNTER — Telehealth: Payer: Self-pay | Admitting: Internal Medicine

## 2015-02-03 ENCOUNTER — Other Ambulatory Visit: Payer: Self-pay | Admitting: Internal Medicine

## 2015-02-03 NOTE — Telephone Encounter (Signed)
Methimazole discontinued due to elevated LFTs.  Please see result note for 01/28/15 labs in results review section.

## 2015-02-04 ENCOUNTER — Telehealth: Payer: Self-pay | Admitting: Internal Medicine

## 2015-02-04 NOTE — Telephone Encounter (Signed)
I called and informed [name] that her LFTs were elevated and that she needs to stop taking methimazole.  She got the medication bottle and read the name to me and confirmed that she would stop taking it.  I will check her LFTs and TFTs at next visit in April.  I gave her Dr. Charlean SanfilippoGherghe's name and contact number again and advised her to call as soon as possible to schedule an appointment.  She agrees to this plan.

## 2015-02-05 ENCOUNTER — Ambulatory Visit (HOSPITAL_COMMUNITY)
Admission: RE | Admit: 2015-02-05 | Discharge: 2015-02-05 | Disposition: A | Discharge status: Home / Self Care | Payer: PPO | Source: Ambulatory Visit | Attending: Internal Medicine | Admitting: Internal Medicine

## 2015-02-05 DIAGNOSIS — Z780 Asymptomatic menopausal state: Secondary | ICD-10-CM | POA: Diagnosis not present

## 2015-02-05 DIAGNOSIS — Z13820 Encounter for screening for osteoporosis: Secondary | ICD-10-CM | POA: Insufficient documentation

## 2015-02-05 DIAGNOSIS — E0590 Thyrotoxicosis, unspecified without thyrotoxic crisis or storm: Secondary | ICD-10-CM

## 2015-02-18 ENCOUNTER — Ambulatory Visit (AMBULATORY_SURGERY_CENTER): Payer: Self-pay

## 2015-02-18 VITALS — Ht 61.5 in | Wt 159.6 lb

## 2015-02-18 DIAGNOSIS — Z860100 Personal history of colon polyps, unspecified: Secondary | ICD-10-CM

## 2015-02-18 MED ORDER — SUPREP BOWEL PREP KIT 17.5-3.13-1.6 GM/177ML PO SOLN: 1.0000 | Freq: Once | ORAL | Status: DC

## 2015-02-18 NOTE — Progress Notes (Signed)
No allergies to eggs or soy No diet/weight loss meds No home oxygen No past problems with anesthesia  No email 

## 2015-02-27 ENCOUNTER — Telehealth: Payer: Self-pay | Admitting: Internal Medicine

## 2015-02-27 NOTE — Telephone Encounter (Signed)
Call to patient to confirm appointment for 03/02/15 at 9:15 lmtcb.

## 2015-03-02 ENCOUNTER — Ambulatory Visit (INDEPENDENT_AMBULATORY_CARE_PROVIDER_SITE_OTHER): Payer: PPO | Admitting: Internal Medicine

## 2015-03-02 VITALS — BP 144/87 | HR 76 | Temp 98.1°F | Wt 160.3 lb

## 2015-03-02 DIAGNOSIS — G252 Other specified forms of tremor: Secondary | ICD-10-CM | POA: Diagnosis not present

## 2015-03-02 DIAGNOSIS — E0580 Other thyrotoxicosis without thyrotoxic crisis or storm: Secondary | ICD-10-CM

## 2015-03-02 DIAGNOSIS — M8580 Other specified disorders of bone density and structure, unspecified site: Secondary | ICD-10-CM | POA: Insufficient documentation

## 2015-03-02 DIAGNOSIS — R945 Abnormal results of liver function studies: Secondary | ICD-10-CM

## 2015-03-02 DIAGNOSIS — R259 Unspecified abnormal involuntary movements: Secondary | ICD-10-CM

## 2015-03-02 DIAGNOSIS — Z0000 Encounter for general adult medical examination without abnormal findings: Secondary | ICD-10-CM

## 2015-03-02 DIAGNOSIS — R748 Abnormal levels of other serum enzymes: Secondary | ICD-10-CM | POA: Insufficient documentation

## 2015-03-02 DIAGNOSIS — E0590 Thyrotoxicosis, unspecified without thyrotoxic crisis or storm: Secondary | ICD-10-CM

## 2015-03-02 LAB — COMPLETE METABOLIC PANEL WITH GFR
ALK PHOS: 142 U/L — AB (ref 39–117)
ALT: 22 U/L (ref 0–35)
AST: 13 U/L (ref 0–37)
Albumin: 3.8 g/dL (ref 3.5–5.2)
BILIRUBIN TOTAL: 1.1 mg/dL (ref 0.2–1.2)
BUN: 12 mg/dL (ref 6–23)
CO2: 27 mEq/L (ref 19–32)
CREATININE: 0.73 mg/dL (ref 0.50–1.10)
Calcium: 9.3 mg/dL (ref 8.4–10.5)
Chloride: 103 mEq/L (ref 96–112)
GFR, Est African American: >89 mL/min
GFR, Est Non African American: 83 mL/min
GLUCOSE: 83 mg/dL (ref 70–99)
Potassium: 3.7 mEq/L (ref 3.5–5.3)
Sodium: 140 mEq/L (ref 135–145)
Total Protein: 7.5 g/dL (ref 6.0–8.3)

## 2015-03-02 LAB — TSH: TSH: 0.129 u[IU]/mL — AB (ref 0.350–4.500)

## 2015-03-02 LAB — T3, FREE: T3, Free: 3.3 pg/mL (ref 2.3–4.2)

## 2015-03-02 LAB — T4, FREE: Free T4: 0.97 ng/dL (ref 0.80–1.80)

## 2015-03-02 MED ORDER — GABAPENTIN 100 MG PO CAPS: 100.0000 mg | ORAL_CAPSULE | Freq: Three times a day (TID) | ORAL | Status: DC

## 2015-03-02 NOTE — Progress Notes (Signed)
   Subjective:    Patient ID: Nicole Baxter, female    DOB: 08/15/1941, 74 y.o.   MRN: [id number]  HPI Comments: Nicole Baxter is a 74 year old woman with PMH as below here for follow-up of thyroid function and re-check of liver enzymes. Please see problem based charting for details.    Past Medical History  Diagnosis Date  . Obesity   . Hyperglycemia     isolated FBS 122, 95, 104 (Random 138)  . 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 GERD (gastroesophageal reflux disease)     controlled on prilosec  . Hyperlipidemia     on pravastatin  . Hypertension     controlled on 2 agents  . Tremor   Subclinical hyperthyroidism  Review of Systems  Constitutional: Negative for fever, chills and appetite change.  Respiratory: Negative for shortness of breath.   Cardiovascular: Negative for chest pain, palpitations and leg swelling.  Gastrointestinal: Negative for nausea, vomiting, abdominal pain, diarrhea, constipation and blood in stool.  Genitourinary: Negative for dysuria.  Neurological: Positive for tremors.       Filed Vitals:   03/02/15 0953 03/02/15 1035  BP: 151/121 144/87  Pulse: 76   Temp: 98.1 F (36.7 C)   TempSrc: Oral   Weight: 160 lb 4.8 oz (72.712 kg)   SpO2: 99%     Objective:   Physical Exam  Constitutional: She is oriented to person, place, and time. She appears well-developed. No distress.  HENT:  Head: Normocephalic and atraumatic.  Mouth/Throat: Oropharynx is clear and moist. No oropharyngeal exudate.  Eyes: EOM are normal. Pupils are equal, round, and reactive to light.  Neck: Neck supple. No thyromegaly present.  Cardiovascular: Normal rate and regular rhythm.  Exam reveals no gallop and no friction rub.   No murmur heard. Pulmonary/Chest: Effort normal and breath sounds normal. No respiratory distress. She has no wheezes. She has no rales.  Abdominal: Soft. Bowel sounds are normal. She exhibits no distension and no mass. There is no  tenderness. There is no rebound and no guarding.  Musculoskeletal: Normal range of motion. She exhibits no edema or tenderness.  Neurological: She is alert and oriented to person, place, and time. No cranial nerve deficit. She exhibits abnormal muscle tone.  + resting tremor of B/L forearms/hands that resolves with intentional movement; + increased tone of upper extremities with passive motion  Skin: Skin is warm. She is not diaphoretic.  Psychiatric: She has a normal mood and affect. Her behavior is normal. Judgment and thought content normal.  Vitals reviewed.         Assessment & Plan:  Please see problem based assessment and plan.

## 2015-03-02 NOTE — Assessment & Plan Note (Addendum)
01/28/2015 LFTs elevated while on methimazole for subclinical hyperthyroidism.  Methimazole likely caused the increased LFTs and this med was stopped at that time. - CMP today

## 2015-03-02 NOTE — Assessment & Plan Note (Signed)
She is unsure if she has been taking the gabapentin or not.  She did stop Benadryl like I asked her to.  Resting tremor, rigidity and bradykinesia (I had her walk at our last visit) concerning for PD.  She has been evaluated by neuro in the past and was to have followed up but has not been back.  She is agreeable to seeing neuro again. - gabapentin 100mg  TID - referral to neurology

## 2015-03-02 NOTE — Patient Instructions (Signed)
1. We are arranging an appointment with the endocrinologist (Dr. Elvera LennoxGherghe) to discuss treatment for your thyroid problem.  Please DO NOT take METHIMAZOLE.  I am checking labs today and will let you know if there are problems.  I am also referring you to the neurologist because of your tremor.  The neurologist's office will call you to arrange the appointment.    2. Please take all medications as prescribed.  DO NOT take METHIMAZOLE.  I have prescribed gabapentin to see if it will help with your tremor.  Now that you have finished taking high dose vitamin D on Fridays every week.  Please buy vitamin D (800 units daily) and Calcium (1200mg  daily) at your local pharmacy.  You should take the vitamin D (800 units) and calcium (1200mg ) daily.   3. If you have worsening of your symptoms or new symptoms arise, please call the clinic [phone]), or go to the ER immediately if symptoms are severe.   Calcium; Vitamin D chewable tablet What is this medicine? CALCIUM; VITAMIN D (KAL see um; VYE ta min D) is a vitamin supplement. It is used to prevent conditions of low calcium and vitamin D. This medicine may be used for other purposes; ask your health care provider or pharmacist if you have questions. COMMON BRAND NAME(S): Calcet Citrate Soft Chew, Caltrate 600+D, Caltrate Gummy Bites, Citracal Calcium, Citracal Creamy Bites, Os-Cal 500 Plus D, OSCAL with Vitamin D3, Oysco 500 + D What should I tell my health care provider before I take this medicine? They need to know if you have any of these conditions: -constipation -dehydration -heart disease -high level of calcium or vitamin D in the blood -high level of phosphate in the blood -kidney disease -kidney stones -liver disease -parathyroid disease -sarcoidosis -stomach ulcer or obstruction -an unusual or allergic reaction to calcium, vitamin D, tartrazine dye, other medicines, foods, dyes, or preservatives -pregnant or trying to get  pregnant -breast-feeding How should I use this medicine? Take this medicine by mouth. Chew it completely before swallowing. Follow the directions on the prescription label. Take with food or within 1 hour after a meal. Take your medicine at regular intervals. Do not take your medicine more often than directed. Talk to your pediatrician regarding the use of this medicine in children. While this medicine may be used in children for selected conditions, precautions do apply. Overdosage: If you think you have taken too much of this medicine contact a poison control center or emergency room at once. NOTE: This medicine is only for you. Do not share this medicine with others. What if I miss a dose? If you miss a dose, take it as soon as you can. If it is almost time for your next dose, take only that dose. Do not take double or extra doses. What may interact with this medicine? Do not take this medicine with any of the following medications: -ammonium chloride -methenamine This medicine may also interact with the following medications: -antibiotics like ciprofloxacin, gatifloxacin, tetracycline -captopril -delavirdine -diuretics -gabapentin -iron supplements -medicines for fungal infections like ketoconazole and itraconazole -medicines for seizures like ethotoin and phenytoin -mineral oil -mycophenolate -other vitamins with calcium, vitamin D, or minerals -quinidine -rosuvastatin -sucralfate -thyroid medicine This list may not describe all possible interactions. Give your health care provider a list of all the medicines, herbs, non-prescription drugs, or dietary supplements you use. Also tell them if you smoke, drink alcohol, or use illegal drugs. Some items may interact with your medicine. What should  I watch for while using this medicine? Taking this medicine is not a substitute for a well-balanced diet and exercise. Talk with your doctor or health care provider and follow a healthy  lifestyle. Do not take this medicine with high-fiber foods, large amounts of alcohol, or drinks containing caffeine. Do not take this medicine within 2 hours of any other medicines. What side effects may I notice from receiving this medicine? Side effects that you should report to your doctor or health care professional as soon as possible: -allergic reactions like skin rash, itching or hives, swelling of the face, lips, or tongue -confusion -dry mouth -high blood pressure -increased hunger or thirst -increased urination -irregular heartbeat -metallic taste -muscle or bone pain -pain when urinating -seizure -unusually weak or tired -weight loss Side effects that usually do not require medical attention (report to your doctor or health care professional if they continue or are bothersome): -constipation -diarrhea -headache -loss of appetite -nausea, vomiting -stomach upset This list may not describe all possible side effects. Call your doctor for medical advice about side effects. You may report side effects to FDA at 1-800-FDA-1088. Where should I keep my medicine? Keep out of the reach of children. Store at room temperature between 15 and 30 degrees C (59 and 86 degrees F). Protect from light. Keep container tightly closed. Throw away any unused medicine after the expiration date. NOTE: This sheet is a summary. It may not cover all possible information. If you have questions about this medicine, talk to your doctor, pharmacist, or health care provider.  2015, [employer]vier/[employer]. (2008-02-27 17:57:27)

## 2015-03-02 NOTE — Assessment & Plan Note (Signed)
Dx by DEXA 01/2015.  Advised supplement with 1200mg  calcium and 800 IU of vitamin D daily.  She agrees to pick these up OTC.

## 2015-03-02 NOTE — Assessment & Plan Note (Signed)
Colonoscopy:  Scheduled for 03/04/15 DEXA: completed Mammo: will refer at next visit because she has colonoscopy this week, appt with endocrine this month and I referred to neurology today Zostavax: insurance did not cover in the past.  Will attempt to re-prescribe in the future now that she has new insurance provider

## 2015-03-02 NOTE — Assessment & Plan Note (Signed)
Methimazole was d/c 1 month ago due to elevated LFTs.  TSH at that time was slightly elevated with anti-thyroid treatment.  She remains asymptomatic.  DEXA scan shows bone density in osteopenic range.   - check TSH, free T3, free T4 and LFTs - endocrinology appt scheduled for 03/26/15 with Dr. Elvera LennoxGherghe to discuss further management options (ie RAI)

## 2015-03-04 ENCOUNTER — Encounter: Payer: Self-pay | Admitting: Gastroenterology

## 2015-03-04 ENCOUNTER — Ambulatory Visit (AMBULATORY_SURGERY_CENTER): Payer: PPO | Admitting: Gastroenterology

## 2015-03-04 ENCOUNTER — Other Ambulatory Visit: Payer: Self-pay | Admitting: Internal Medicine

## 2015-03-04 VITALS — BP 129/67 | HR 229 | Temp 95.7°F | Resp 23 | Ht 61.5 in | Wt 159.0 lb

## 2015-03-04 DIAGNOSIS — Z86010 Personal history of colonic polyps: Secondary | ICD-10-CM

## 2015-03-04 DIAGNOSIS — K5730 Diverticulosis of large intestine without perforation or abscess without bleeding: Secondary | ICD-10-CM

## 2015-03-04 DIAGNOSIS — R7989 Other specified abnormal findings of blood chemistry: Secondary | ICD-10-CM

## 2015-03-04 DIAGNOSIS — R945 Abnormal results of liver function studies: Principal | ICD-10-CM

## 2015-03-04 MED ORDER — SODIUM CHLORIDE 0.9 % IV SOLN: 500.0000 mL | INTRAVENOUS | Status: DC

## 2015-03-04 NOTE — Progress Notes (Signed)
Internal Medicine Clinic Attending  Case discussed with Dr. Wilson soon after the resident saw the patient.  We reviewed the resident's history and exam and pertinent patient test results.  I agree with the assessment, diagnosis, and plan of care documented in the resident's note.  

## 2015-03-04 NOTE — Patient Instructions (Signed)
Discharge instructions given. Handouts on diverticulosis and hemorrhoids. Resume previous medications. YOU HAD AN ENDOSCOPIC PROCEDURE TODAY AT THE [facility]:   Refer to the procedure report that was given to you for any specific questions about what was found during the examination.  If the procedure report does not answer your questions, please call your gastroenterologist to clarify.  If you requested that your care partner not be given the details of your procedure findings, then the procedure report has been included in a sealed envelope for you to review at your convenience later.  YOU SHOULD EXPECT: Some feelings of bloating in the abdomen. Passage of more gas than usual.  Walking can help get rid of the air that was put into your GI tract during the procedure and reduce the bloating. If you had a lower endoscopy (such as a colonoscopy or flexible sigmoidoscopy) you may notice spotting of blood in your stool or on the toilet paper. If you underwent a bowel prep for your procedure, you may not have a normal bowel movement for a few days.  Please Note:  You might notice some irritation and congestion in your nose or some drainage.  This is from the oxygen used during your procedure.  There is no need for concern and it should clear up in a day or so.  SYMPTOMS TO REPORT IMMEDIATELY:   Following lower endoscopy (colonoscopy or flexible sigmoidoscopy):  Excessive amounts of blood in the stool  Significant tenderness or worsening of abdominal pains  Swelling of the abdomen that is new, acute  Fever of 100F or higher   For urgent or emergent issues, a gastroenterologist can be reached at any hour by calling [phone].   DIET: Your first meal following the procedure should be a small meal and then it is ok to progress to your normal diet. Heavy or fried foods are harder to digest and may make you feel nauseous or bloated.  Likewise, meals heavy in dairy and vegetables can  increase bloating.  Drink plenty of fluids but you should avoid alcoholic beverages for 24 hours.  ACTIVITY:  You should plan to take it easy for the rest of today and you should NOT DRIVE or use heavy machinery until tomorrow (because of the sedation medicines used during the test).    FOLLOW UP: Our staff will call the number listed on your records the next business day following your procedure to check on you and address any questions or concerns that you may have regarding the information given to you following your procedure. If we do not reach you, we will leave a message.  However, if you are feeling well and you are not experiencing any problems, there is no need to return our call.  We will assume that you have returned to your regular daily activities without incident.  If any biopsies were taken you will be contacted by phone or by letter within the next 1-3 weeks.  Please call us at [phone] if you have not heard about the biopsies in 3 weeks.    SIGNATURES/CONFIDENTIALITY: You and/or your care partner have signed paperwork which will be entered into your electronic medical record.  These signatures attest to the fact that that the information above on your After Visit Summary has been reviewed and is understood.  Full responsibility of the confidentiality of this discharge information lies with you and/or your care-partner. 

## 2015-03-04 NOTE — Op Note (Signed)
[facility] [address] [place], [id number]   COLONOSCOPY PROCEDURE REPORT  PATIENT: Nicole Baxter, [name]  MR#: [id number] BIRTHDATE: 31-Oct-1941 , 73  yrs. old GENDER: female ENDOSCOPIST: Louis Meckelobert D Ailed Defibaugh, MD REFERRED BY: PROCEDURE DATE:  03/04/2015 PROCEDURE:   Colonoscopy, surveillance First Screening Colonoscopy - Avg.  risk and is 50 yrs.  old or older - No.  Prior Negative Screening - Now for repeat screening. N/A  History of Adenoma - Now for follow-up colonoscopy & has been > or = to 3 yrs.  Yes hx of adenoma.  Has been 3 or more years since last colonoscopy. ASA CLASS:   Class II INDICATIONS:PH Colon Adenoma.  2011 MEDICATIONS: Monitored anesthesia care, Propofol 200 mg IV, and Lidocaine 40 mg IV  DESCRIPTION OF PROCEDURE:   After the risks benefits and alternatives of the procedure were thoroughly explained, informed consent was obtained.  The digital rectal exam revealed no abnormalities of the rectum.   The LB CF-H180AL Loaner [id number] endoscope was introduced through the anus and advanced to the ileum. No adverse events experienced.   The quality of the prep was (Suprep was used) excellent.  The instrument was then slowly withdrawn as the colon was fully examined.      COLON FINDINGS: There was severe diverticulosis noted in the sigmoid colon with associated muscular hypertrophy.   There was mild diverticulosis noted in the transverse colon and descending colon. Internal hemorrhoids were found.  Retroflexed views revealed no abnormalities. The time to cecum = 2.7 Withdrawal time = 7.3   The scope was withdrawn and the procedure completed. COMPLICATIONS: There were no immediate complications.  ENDOSCOPIC IMPRESSION: 1.   There was severe diverticulosis noted in the sigmoid colon 2.   There was mild diverticulosis noted in the transverse colon and descending colon 3.   Internal hemorrhoids  RECOMMENDATIONS: Colonoscopy 7 years  eSigned:  Louis Meckelobert  D Beth Goodlin, MD 03/04/2015 9:55 AM   cc: Evelena PeatAlex Wilson, MD

## 2015-03-05 ENCOUNTER — Telehealth: Payer: Self-pay | Admitting: *Deleted

## 2015-03-05 NOTE — Telephone Encounter (Signed)
  Follow up Call-  Call back number 03/04/2015  Post procedure Call Back phone  # [phone]  Permission to leave phone message Yes    Pt was asleep; spoke with husband Patient questions:  Do you have a fever, pain , or abdominal swelling? No. Pain Score  0 *  Have you tolerated food without any problems? Yes.    Have you been able to return to your normal activities? Yes.    Do you have any questions about your discharge instructions: Diet   No. Medications  No. Follow up visit  No.  Do you have questions or concerns about your Care? No.  Actions: * If pain score is 4 or above: No action needed, pain <4.

## 2015-03-06 ENCOUNTER — Telehealth: Payer: Self-pay | Admitting: *Deleted

## 2015-03-06 NOTE — Telephone Encounter (Signed)
CALLED PATIENT TO FOLLOW UP ON HER CALLING [facility] BACK. THEY WERE UNABLE TO GET IN CONTACT WITH THE PATIENT. PATIENT STATES SHE WAS SEEN BY THEM YESTERDAY. TOLD PATIENT THAT I WAS GLAD SHE CALL AND FOLLOWED UP WITH THEIR OFFICE. LELA STURDIVAT NTII  9:42AM

## 2015-03-10 ENCOUNTER — Encounter: Payer: Self-pay | Admitting: Internal Medicine

## 2015-03-10 DIAGNOSIS — K5790 Diverticulosis of intestine, part unspecified, without perforation or abscess without bleeding: Secondary | ICD-10-CM | POA: Insufficient documentation

## 2015-03-10 DIAGNOSIS — K648 Other hemorrhoids: Secondary | ICD-10-CM | POA: Insufficient documentation

## 2015-03-20 ENCOUNTER — Ambulatory Visit (INDEPENDENT_AMBULATORY_CARE_PROVIDER_SITE_OTHER): Payer: PPO | Admitting: Internal Medicine

## 2015-03-20 ENCOUNTER — Encounter: Payer: Self-pay | Admitting: Internal Medicine

## 2015-03-20 VITALS — BP 129/77 | HR 78 | Temp 98.2°F | Ht 61.0 in | Wt 158.8 lb

## 2015-03-20 DIAGNOSIS — M170 Bilateral primary osteoarthritis of knee: Secondary | ICD-10-CM

## 2015-03-20 DIAGNOSIS — R945 Abnormal results of liver function studies: Secondary | ICD-10-CM

## 2015-03-20 DIAGNOSIS — R748 Abnormal levels of other serum enzymes: Secondary | ICD-10-CM

## 2015-03-20 DIAGNOSIS — R7989 Other specified abnormal findings of blood chemistry: Secondary | ICD-10-CM

## 2015-03-20 LAB — HEPATIC FUNCTION PANEL
ALBUMIN: 3.6 g/dL (ref 3.5–5.2)
ALK PHOS: 119 U/L — AB (ref 39–117)
ALT: <8 U/L (ref 0–35)
AST: 17 U/L (ref 0–37)
Bilirubin, Direct: 0.2 mg/dL (ref 0.0–0.3)
Indirect Bilirubin: 0.9 mg/dL (ref 0.2–1.2)
TOTAL PROTEIN: 7.1 g/dL (ref 6.0–8.3)
Total Bilirubin: 1.1 mg/dL (ref 0.2–1.2)

## 2015-03-20 MED ORDER — CAPSAICIN 0.025 % EX CREA: TOPICAL_CREAM | Freq: Two times a day (BID) | CUTANEOUS | Status: DC

## 2015-03-20 NOTE — Patient Instructions (Signed)
It was a pleasure seeing you today, [name].   Osteoarthritis of Knees - Take Tylenol 500 mg twice a day (once in morning and once in evening) to help your pain - Try Capsaicin cream. Apply to knees 2 times daily.   General Instructions:   Please bring your medicines with you each time you come to clinic.  Medicines may include prescription medications, over-the-counter medications, herbal remedies, eye drops, vitamins, or other pills.   Progress Toward Treatment Goals:  Treatment Goal 01/28/2015  Hemoglobin A1C at goal  Blood pressure at goal    Self Care Goals & Plans:  Self Care Goal 03/02/2015  Manage my medications take my medicines as prescribed; bring my medications to every visit; refill my medications on time  Eat healthy foods eat foods that are low in salt; eat baked foods instead of fried foods  Meeting treatment goals -    Home Blood Glucose Monitoring 01/28/2015  Check my blood sugar no home glucose monitoring  When to check my blood sugar N/A     Care Management & Community Referrals:  Referral 03/18/2014  Referrals made for care management support none needed  Referrals made to community resources none

## 2015-03-21 NOTE — Assessment & Plan Note (Signed)
LFTs repeated today so that patient does not have to return for lab only visit. LFTs thought to be previously elevated due to her methimazole which she discontinued. LFTs appear normal again, except for alk phos which is elevated at 119.

## 2015-03-21 NOTE — Assessment & Plan Note (Signed)
Patient with worsening of her bilateral osteoarthritic knee pain.  - Recommended to increase Tylenol to twice a day - Prescription for capsaicin cream to be applied twice daily to her knees - Instructed to follow up if pain does not improve

## 2015-03-21 NOTE — Progress Notes (Signed)
   Subjective:    Patient ID: Nicole Baxter, female    DOB: 06/06/1941, 74 y.o.   MRN: [id number]  HPI Nicole Baxter is a 74yo woman with PMHx of HTN, Type 2 DM, osteoarthritis, and hyperlipidemia who presents today for an acute visit.  Patient reports she is having worsening stiffness and knee pain bilaterally. She states this has been ongoing for the past year. She reports the pain is intermittent and worse in the evenings. She reports pain with ambulating occasionally. She uses a walker or cane at baseline. She states she has been taking a Tylenol daily without relief. She does not know the dosage. She has a history of OA in both her knees.    Review of Systems General: Denies fever, chills, night sweats, changes in weight, changes in appetite HEENT: Denies headaches, ear pain, changes in vision, rhinorrhea, sore throat CV: Denies CP, palpitations, SOB, orthopnea Pulm: Denies SOB, cough, wheezing GI: Denies abdominal pain, nausea, vomiting, diarrhea, constipation, melena, hematochezia GU: Denies dysuria, hematuria, frequency Msk: Denies muscle cramps Neuro: Denies weakness, numbness, tingling Skin: Denies rashes, bruising    Objective:   Physical Exam General: sitting up in chair, pleasant, NAD CV: RRR, no m/g/r Pulm: CTA bilaterally, breaths non-labored  Ext: warm, no edema, no tenderness to palpation Neuro: alert and oriented x 3. Resting tremor present in bilateral forearms and hands.     Assessment & Plan:  Please refer to A&P documentation.

## 2015-03-22 NOTE — Progress Notes (Signed)
Internal Medicine Clinic Attending  Case discussed with Dr. Rivet at the time of the visit.  We reviewed the resident's history and exam and pertinent patient test results.  I agree with the assessment, diagnosis, and plan of care documented in the resident's note.  

## 2015-03-26 ENCOUNTER — Ambulatory Visit (INDEPENDENT_AMBULATORY_CARE_PROVIDER_SITE_OTHER): Payer: PPO | Admitting: Internal Medicine

## 2015-03-26 ENCOUNTER — Encounter: Payer: Self-pay | Admitting: Internal Medicine

## 2015-03-26 VITALS — BP 124/70 | HR 114 | Temp 98.2°F | Resp 14 | Wt 159.8 lb

## 2015-03-26 DIAGNOSIS — E0590 Thyrotoxicosis, unspecified without thyrotoxic crisis or storm: Secondary | ICD-10-CM

## 2015-03-26 LAB — T3, FREE: T3, Free: 2.8 pg/mL (ref 2.3–4.2)

## 2015-03-26 LAB — T4, FREE: FREE T4: 0.71 ng/dL (ref 0.60–1.60)

## 2015-03-26 LAB — TSH: TSH: 0.13 u[IU]/mL — ABNORMAL LOW (ref 0.35–4.50)

## 2015-03-26 NOTE — Progress Notes (Signed)
Patient ID: Nicole Baxter, female   DOB: 06/24/1941, 74 y.o.   MRN: [id number]   HPI  Nicole NgoRuby L Hunger is a 74 y.o.-year-old female, referred by her PCP, Dr. Evelena PeatAlex Wilson, for management of subclinical hyperthyroidism. She is here with her husband who offers part of the history.  Pt had a thyroid uptake and scan (11/05/2014), showing elevated uptake, with a nodular gland, consistent with either nodular Graves' disease or TMNG: Uniform uptake within a lobular thyroid gland. One more tense focus of uptake is noted in the inferior left lobe of thyroid gland towards the isthmus.  24 hour I 131 uptake = 39.7% (normal 10-30%)  IMPRESSION: Increased I 131 uptake within a nodular gland could either represent nodular Graves disease versus toxic multinodular goiter.  Pt was started on MMI 5 mg 3x a day in 01/2015 >> she responded well to this >> became slightly hypothyroid, however, her liver tests increased >> the methimazole had to be stopped. Since her hyperthyroidism was mild, the methimazole was stopped completely at that time. A repeat TSH was again low a month later. Her LFTs improved to normal.  I reviewed pt's thyroid tests: Lab Results  Component Value Date   TSH 0.129* 03/02/2015   TSH 4.512* 01/28/2015   TSH 0.012* 12/24/2014   TSH 0.016* 10/27/2014   TSH 0.025* 10/15/2014   TSH 0.019* 06/25/2014   TSH 0.139* 12/24/2013   TSH 0.663 07/13/2010   TSH 0.611 05/19/2009   FREET4 0.97 03/02/2015   FREET4 0.67* 01/28/2015   FREET4 0.96 12/24/2014   FREET4 0.90 10/27/2014   FREET4 0.66* 10/15/2014   FREET4 1.14 06/25/2014   FREET4 1.13 12/25/2013    I reviewed patient's LFTs (01/28/2015 on methimazole): Component     Latest Ref Rng 01/28/2015 03/02/2015 03/20/2015  Alkaline Phosphatase     39 - 117 U/L 189 (H) 142 (H) 119 (H)  AST     0 - 37 U/L 146 (H) 13 17  ALT     0 - 35 U/L 125 (H) 22 <8  Total Protein     6.0 - 8.3 g/dL 7.2 7.5 7.1  Albumin     3.5 - 5.2 g/dL 3.8 3.8 3.6   Pt  denies feeling nodules in neck, hoarseness, dysphagia/odynophagia, SOB with lying down; she c/o: - + fatigue - + tremors (not new, seen by neurology) - no excessive sweating/heat intolerance - no anxiety - no palpitations - no hyperdefecation - no weight loss - no hair loss  Pt does not have a FH of thyroid ds. No FH of thyroid cancer. No h/o radiation tx to head or neck.  No seaweed or kelp, no recent contrast studies. No steroid use. No herbal supplements. No Biotin use.  I reviewed her chart and she also has a history of essential tremor, HTN, HL, asthma.   ROS: Constitutional: + see HPI Eyes: no blurry vision, no xerophthalmia ENT: no sore throat, no nodules palpated in throat, no dysphagia/odynophagia, no hoarseness Cardiovascular: no CP/SOB/palpitations/+ leg swelling Respiratory: + all: cough/SOB/wheezing Gastrointestinal: no N/V/D/+ C Musculoskeletal: no muscle/+ joint aches Skin: no rashes Neurological: no tremors/numbness/tingling/dizziness Psychiatric: no depression/anxiety  Past Medical History  Diagnosis Date  . Obesity   . Hyperglycemia     isolated FBS 122, 95, 104 (Random 138)  . 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 GERD (gastroesophageal reflux disease)     controlled on prilosec  . Hyperlipidemia     on pravastatin  .  Hypertension     controlled on 2 agents  . Tremor    Past Surgical History  Procedure Laterality Date  . Colonoscopy     History   Social History  . Marital Status: Married    Spouse Name: N/A  . Number of Children: 5   Occupational History  .  retired    Social History Main Topics  . Smoking status: Never Smoker   . Smokeless tobacco: Never Used  . Alcohol Use: No  . Drug Use: No   Social History Narrative   Worked at [employer] 31 years. Married.Active, out and about q day.   5 children, 1 with sarcoidosis, 1 with COPD.   Current Outpatient Prescriptions on File Prior to Visit  Medication Sig  Dispense Refill  . albuterol (PROAIR HFA) 108 (90 BASE) MCG/ACT inhaler Inhale 2 puffs into the lungs every 4 (four) hours as needed for wheezing. 3.7 g 3  . capsaicin (ZOSTRIX) 0.025 % cream Apply topically 2 (two) times daily. 60 g 0  . Fluticasone-Salmeterol (ADVAIR) 500-50 MCG/DOSE AEPB Inhale 1 puff into the lungs 2 (two) times daily as needed (Asthma).    . hydrochlorothiazide (HYDRODIURIL) 12.5 MG tablet Take 1 tablet (12.5 mg total) by mouth daily. 90 tablet 1  . lisinopril (PRINIVIL,ZESTRIL) 20 MG tablet Take 1 tablet (20 mg total) by mouth daily. 90 tablet 1  . omeprazole (PRILOSEC) 40 MG capsule Take 40 mg by mouth daily.       No current facility-administered medications on file prior to visit.   No Known Allergies Family History  Problem Relation Age of Onset  . Heart disease Mother 64    died of MI @ 92  . Alcohol abuse Father   . Cancer Brother   . Colon cancer Neg Hx    PE: BP 124/70 mmHg  Pulse 114  Temp(Src) 98.2 F (36.8 C) (Oral)  Resp 14  Wt 159 lb 12.8 oz (72.485 kg)  SpO2 95% Body mass index is 30.21 kg/(m^2).  Pulse was rechecked at the end of the visit and it was in the 80s. Wt Readings from Last 3 Encounters:  03/26/15 159 lb 12.8 oz (72.485 kg)  03/20/15 158 lb 12.8 oz (72.031 kg)  03/04/15 159 lb (72.122 kg)   Constitutional: overweight, in NAD, in wheelchair Eyes: PERRLA, EOMI, no exophthalmos, no lid lag, no stare ENT: moist mucous membranes, no thyromegaly, no thyroid bruits, no cervical lymphadenopathy Cardiovascular: RRR at the time of the exam, No MRG Respiratory: CTA B Gastrointestinal: abdomen soft, NT, ND, BS+ Musculoskeletal: no deformities, strength intact in all 4 Skin: moist, warm, no rashes Neurological: +++ tremor in B hands, DTR not checked   ASSESSMENT: 1. Subclinical hyperthyroidism  PLAN:  1. Patient with at least 1-1/2 year history of subclinical hyperthyroidism, without thyrotoxic sxs: weight loss, heat intolerance,  hyperdefecation, palpitations, anxiety. However, she has osteopenia (reviewed with her and her husband her DEXA scan from this year, with T-scores significantly decreased since 2009). She also has essential tremor that may be exacerbated by her hyperthyroidism. At the beginning of the visit she had tachycardia, however, which has resolved towards the end of the visit. - We also reviewed together the results of her nuclear medicine scan from 10/2014 showing increased uptake of 39% (normal up to 30%), with multi nodularity. 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 - We discussed about possible diagnosis: Graves ds   toxic multinodular goiter/ toxic adenoma   -  we discussed about possible modalities of treatment for the above conditions, to include methimazole/PTU use, radioactive iodine ablation or (last resort) surgery. She tried methimazole which helped, however, it caused LFT elevation, so a retrial of an MMI or switched to PTU is not recommended. If we need to treat, I recommended RAI ablation and I explained that there is a risk for hypothyroidism afterwards, which would mandate levothyroxine therapy. Surgery would be the absolute last resort. Patient and her husband agree with RAI therapy, if needed. - I discussed with the patient and her husband that the main thing that we need to establish is whether we need to treat her hyperthyroidism. If her TFTs remain abnormal or are worsening, my suggestion would be for treatment due to age, osteopenia, tachycardia, tremors. If the tests are improving, I will continue to follow them.  - I suggested that we check the TSH, fT3 and fT4 and also add thyroid stimulating antibodies to screen for Graves' disease. - RTC in 3 months, but likely sooner for repeat labs  Office Visit on 03/26/2015  Component Date Value Ref Range Status  . TSH 03/26/2015 0.13* 0.35 - 4.50 uIU/mL Final  . Free T4 03/26/2015 0.71  0.60 -  1.60 ng/dL Final  . T3, Free 16/08/9603 2.8  2.3 - 4.2 pg/mL Final  . TSI 03/26/2015 47  <140 % baseline Final   Graves antibodies are negative. TSH stable. 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  Due to comorbidities (see above), I would suggest radioactive iodine treatment with a low dose I-131. No premedication required. I would like to repeat her TFTs 1.5 mo after the treatment.

## 2015-03-26 NOTE — Patient Instructions (Signed)
Please stop at the lab. Please come back for a follow-up appointment in 3 months  Hyperthyroidism The thyroid is a large gland located in the lower front part of your neck. The thyroid helps control metabolism. Metabolism is how your body uses food. It controls metabolism with the hormone thyroxine. When the thyroid is overactive, it produces too much hormone. When this happens, these following problems may occur:   Nervousness  Heat intolerance  Weight loss (in spite of increase food intake)  Diarrhea  Change in hair or skin texture  Palpitations (heart skipping or having extra beats)  Tachycardia (rapid heart rate)  Loss of menstruation (amenorrhea)  Shaking of the hands CAUSES  Grave's Disease (the immune system attacks the thyroid gland). This is the most common cause.  Inflammation of the thyroid gland.  Tumor (usually benign) in the thyroid gland or elsewhere.  Excessive use of thyroid medications (both prescription and 'natural').  Excessive ingestion of Iodine. DIAGNOSIS  To prove hyperthyroidism, your caregiver may do blood tests and ultrasound tests. Sometimes the signs are hidden. It may be necessary for your caregiver to watch this illness with blood tests, either before or after diagnosis and treatment. TREATMENT Short-term treatment There are several treatments to control symptoms. Drugs called beta blockers may give some relief. Drugs that decrease hormone production will provide temporary relief in many people. These measures will usually not give permanent relief. Definitive therapy There are treatments available which can be discussed between you and your caregiver which will permanently treat the problem. These treatments range from surgery (removal of the thyroid), to the use of radioactive iodine (destroys the thyroid by radiation), to the use of antithyroid drugs (interfere with hormone synthesis). The first two treatments are permanent and usually  successful. They most often require hormone replacement therapy for life. This is because it is impossible to remove or destroy the exact amount of thyroid required to make a person euthyroid (normal). HOME CARE INSTRUCTIONS  See your caregiver if the problems you are being treated for get worse. Examples of this would be the problems listed above. SEEK MEDICAL CARE IF: Your general condition worsens. MAKE SURE YOU:   Understand these instructions.  Will watch your condition.  Will get help right away if you are not doing well or get worse. Document Released: 11/14/2005 Document Revised: 02/06/2012 Document Reviewed: 03/28/2007 ExitCare Patient Information 2015 [employer]. This information is not intended to replace advice given to you by your health care provider. Make sure you discuss any questions you have with your health care provider.  

## 2015-03-31 ENCOUNTER — Other Ambulatory Visit: Payer: PPO

## 2015-03-31 LAB — THYROID STIMULATING IMMUNOGLOBULIN: TSI: 47 % baseline (ref <140)

## 2015-04-03 ENCOUNTER — Telehealth: Payer: Self-pay | Admitting: Internal Medicine

## 2015-04-07 ENCOUNTER — Encounter: Payer: Self-pay | Admitting: *Deleted

## 2015-04-08 NOTE — Telephone Encounter (Signed)
error 

## 2015-04-14 ENCOUNTER — Encounter: Payer: Self-pay | Admitting: Neurology

## 2015-04-14 ENCOUNTER — Ambulatory Visit (INDEPENDENT_AMBULATORY_CARE_PROVIDER_SITE_OTHER): Payer: PPO | Admitting: Neurology

## 2015-04-14 VITALS — BP 112/68 | HR 89 | Temp 98.0°F | Ht 61.0 in | Wt 149.2 lb

## 2015-04-14 DIAGNOSIS — G20 Parkinson's disease: Secondary | ICD-10-CM | POA: Diagnosis not present

## 2015-04-14 DIAGNOSIS — R251 Tremor, unspecified: Secondary | ICD-10-CM

## 2015-04-14 NOTE — Patient Instructions (Signed)
Overall you are doing fairly well but I do want to suggest a few things today:   Remember to drink plenty of fluid, eat healthy meals and do not skip any meals. Try to eat protein with a every meal and eat a healthy snack such as fruit or nuts in between meals. Try to keep a regular sleep-wake schedule and try to exercise daily, particularly in the form of walking, 20-30 minutes a day, if you can.   As far as your medications are concerned, I would like to suggest: Discussed a trial or parkinson's medications, can re-evaluate on return  As far as diagnostic testing: MRi brain  I would like to see you back in 4 weeks, sooner if we need to. Please call us with any interim questions, concerns, problems, updates or refill requests.   Please also call us for any test results so we can go over those with you on the phone.  My clinical assistant and will answer any of your questions and relay your messages to me and also relay most of my messages to you.   Our phone number is [phone]. We also have an after hours call service for urgent matters and there is a physician on-call for urgent questions. For any emergencies you know to call 911 or go to the nearest emergency room

## 2015-04-14 NOTE — Progress Notes (Signed)
[facility]    Provider:  Dr Lucia GaskinsAhern Referring Provider: Yolanda MangesWilson, Alex M, DO Primary Care Physician:  Evelena PeatWilson, Alex, DO  CC:  tremor  HPI:  Nicole NgoRuby L Porada is a 74 y.o. female here as a referral from Dr. Andrey CampanileWilson for tremor. She has a past medical history of recently diagnosed hyperthyroidism. She is here with her husband who provides much of the information. She also has a past medical history of essential tremor, hypertension, asthma, hyperlipidemia, asthma, diabetes, osteoarthritis. No history of dopamine blocking agents. 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  She has a daily tremor. She had a tremor in the past, similar tremor but it went away. It came back last year. She has good days and bad days with the tremor. Her right leg sometimes won't move. There are some days when it is better. She shuffles and is slow walking. She can't move when walking sometimes, she freezes. The tone of her voice is lower, husband says she used to talk much louder. She doesn't yell anymore. She denies decreased smell sensation. She has constipation. Her hand writing is terrible. She is able to eat with her tremor. Tremor is better with movement. Worse at rest. Answer legs shake, feels like nervousness. Mother has a tremor. Cousins have tremors so they think it is a family issue. She is hyperthyroid and is going to [facility] for treatment. No FHx of PD. She uses a walker at baseline. Walking has worsened.  Reviewed notes, labs and imaging from outside physicians, which showed: Notes state the patient has been seen by neurology in the past however I cannot find a note in Epic. She follows with Dr. Andrey CampanileWilson who notes resting tremor, rigidity and bradykinesia concerning for Parkinson's disease. She was started on gabapentin 100 mg 3 times a day and  referred to neurology. Notes state that years ago she was seen by neurologist and advised to take Benadryl for tremor. Dr. Andrey CampanileWilson advised her to stop due to risk of anticholinergic symptoms in the elderly. TSH decreased 0.132 weeks ago, liver function tests with elevated AST and a LT, BMP unremarkable  Review of Systems: Patient complains of symptoms per HPI as well as the following symptoms: Word vision, fatigue, weakness, tremor, joint pain, joint swelling, allergies, runny nose. Pertinent negatives per HPI. All others negative.   History   Social History  . Marital Status: Married    Spouse Name: N/A  . Number of Children: 5  . Years of Education: 12   Occupational History  . Not on file.   Social History Main Topics  . Smoking status: Never Smoker   . Smokeless tobacco: Never Used  . Alcohol Use: No  . Drug Use: No  . Sexual Activity: No   Other Topics Concern  . Not on file   Social History Narrative   Worked at [employer] 31 years. Married.Active, out and about q day.   5 children, 1 with sarcoidosis, 1 with COPD.   Caffeine use: Drinks a 6 pack of soda per week    Family History  Problem Relation Age of Onset  . Heart disease Mother 6876    died of MI @ 2276  . Alcohol abuse Father   . Cancer Brother   . Colon cancer Neg Hx     Past Medical History  Diagnosis Date  . Obesity   .  Hyperglycemia     isolated FBS 122, 95, 104 (Random 138)  . 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 GERD (gastroesophageal reflux disease)     controlled on prilosec  . Hyperlipidemia     on pravastatin  . Hypertension     controlled on 2 agents  . Tremor     Past Surgical History  Procedure Laterality Date  . Colonoscopy      Current Outpatient Prescriptions  Medication Sig Dispense Refill  . acetaminophen (TYLENOL) 500 MG tablet Take 500 mg by mouth 2 (two) times daily.    Marland Kitchen albuterol (PROAIR HFA) 108 (90 BASE) MCG/ACT inhaler Inhale 2 puffs into the lungs  every 4 (four) hours as needed for wheezing. 3.7 g 3  . Fluticasone-Salmeterol (ADVAIR) 500-50 MCG/DOSE AEPB Inhale 1 puff into the lungs 2 (two) times daily as needed (Asthma).    . hydrochlorothiazide (HYDRODIURIL) 12.5 MG tablet Take 1 tablet (12.5 mg total) by mouth daily. 90 tablet 1  . Investigational vitamin D 600 UNITS capsule SWOG S0812 Take 600 Units by mouth daily. Take with food.    Marland Kitchen lisinopril (PRINIVIL,ZESTRIL) 20 MG tablet Take 1 tablet (20 mg total) by mouth daily. 90 tablet 1  . omeprazole (PRILOSEC) 40 MG capsule Take 40 mg by mouth daily.      . capsaicin (ZOSTRIX) 0.025 % cream Apply topically 2 (two) times daily. (Patient not taking: Reported on 04/14/2015) 60 g 0   No current facility-administered medications for this visit.    Allergies as of 04/14/2015  . (No Known Allergies)    Vitals: BP 112/68 mmHg  Pulse 89  Temp(Src) 98 F (36.7 C)  Ht  (1.549 m)  Wt 149 lb 3.2 oz (67.677 kg)  BMI 28.21 kg/m2 Last Weight:  Wt Readings from Last 1 Encounters:  04/14/15 149 lb 3.2 oz (67.677 kg)   Last Height:   Ht Readings from Last 1 Encounters:  04/14/15  (1.549 m)    Physical exam: Exam: Gen: NAD, conversant, well nourised, well groomed                     CV: RRR, no MRG. No Carotid Bruits. No peripheral edema, warm, nontender Eyes: Conjunctivae clear without exudates or hemorrhage  Neuro: Detailed Neurologic Exam  Speech:    Speech is normal; fluent and spontaneous with normal comprehension.  Cognition:    The patient is oriented to person, place, and time;     recent and remote memory intact;     language fluent;     normal attention, concentration,     fund of knowledge Cranial Nerves:    The pupils are equal, round, and reactive to light. The fundi are flat. Visual fields are full to finger confrontation. Extraocular movements are intact. Trigeminal sensation is intact and the muscles of mastication are normal. The face is symmetric. The  palate elevates in the midline. Hearing intact. Voice is normal. Shoulder shrug is normal. The tongue has normal motion without fasciculations.   Coordination: bradykinesia on finger tap  Gait:  Decreased arm swing, re-emergent tremor. Bradykinesia.       Motor Observation:    Resting tremor in the bilateral upper extremities worse than lower extremities with postural and intention components.  Tone:   Increased tone in the upper extremities  Posture:    Slightly stooped    Strength:    Strength is intact in the upper and lower limbs.  Sensation: intact to LT     Reflex Exam:  DTR's: Hyporeflexic throughout but symmetrical       Toes:    The toes are downgoing bilaterally.   Clonus:    Clonus is absent.      Assessment/Plan:   Nicole Baxter is a 74 y.o. female here as a referral from Dr. Andrey CampanileWilson for tremor. She has a past medical history of recently diagnosed hyperthyroidism, essential tremor, hypertension, asthma, hyperlipidemia, diabetes, osteoarthritis. No history of dopamine blocking agents. 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  Patient does report freezing, shuffling of gait, resting tremor. Exam does show bradykinesia on finger taps and walking, increased tone in the upper extremities and resting tremor in all extremities. What is unclear is how much her hyperthyroidism is contributing to the tremor and other symptoms. Discussed this with patient and her husband, think that Parkinson's disease is a possibility however with need to reevaluate after treatment of hyperthyroidism. Did discuss trying medication with patient and her husband although they would prefer to wait as well. In the meantime I'll order an MRI of the brain without contrast. Follow-up in 4 weeks.  Naomie DeanAntonia Lolamae Voisin, MD  [facility] [address] [place], [place] [id number]  Phone [phone] Fax [phone]

## 2015-04-16 ENCOUNTER — Encounter (HOSPITAL_COMMUNITY)
Admission: RE | Admit: 2015-04-16 | Discharge: 2015-04-16 | Disposition: A | Discharge status: Home / Self Care | Payer: PPO | Source: Ambulatory Visit | Attending: Internal Medicine | Admitting: Internal Medicine

## 2015-04-16 DIAGNOSIS — E0590 Thyrotoxicosis, unspecified without thyrotoxic crisis or storm: Secondary | ICD-10-CM | POA: Insufficient documentation

## 2015-04-16 MED ORDER — SODIUM IODIDE I 131 CAPSULE
19.3000 | Freq: Once | INTRAVENOUS | Status: AC | PRN
  Administered 2015-04-16: 19.3 via ORAL

## 2015-04-29 ENCOUNTER — Other Ambulatory Visit: Payer: PPO

## 2015-04-29 ENCOUNTER — Other Ambulatory Visit (INDEPENDENT_AMBULATORY_CARE_PROVIDER_SITE_OTHER): Payer: PPO

## 2015-04-29 DIAGNOSIS — E0590 Thyrotoxicosis, unspecified without thyrotoxic crisis or storm: Secondary | ICD-10-CM | POA: Diagnosis not present

## 2015-04-29 LAB — T4, FREE: Free T4: 1.18 ng/dL (ref 0.60–1.60)

## 2015-04-29 LAB — TSH: TSH: 0.01 u[IU]/mL — ABNORMAL LOW (ref 0.35–4.50)

## 2015-04-29 LAB — T3, FREE: T3 FREE: 4.9 pg/mL — AB (ref 2.3–4.2)

## 2015-04-30 ENCOUNTER — Encounter: Payer: Self-pay | Admitting: *Deleted

## 2015-04-30 ENCOUNTER — Other Ambulatory Visit: Payer: Self-pay | Admitting: *Deleted

## 2015-04-30 ENCOUNTER — Telehealth: Payer: Self-pay | Admitting: *Deleted

## 2015-04-30 DIAGNOSIS — E0590 Thyrotoxicosis, unspecified without thyrotoxic crisis or storm: Secondary | ICD-10-CM

## 2015-04-30 NOTE — Telephone Encounter (Addendum)
Pt stopped by clinic with 2 request - 1. She went for a Brain MRI and was not able to stay in the scanner.  She is asking for medication to help with this.  She is rescheduled for next Thursday. 2. She is also asking for DME order for a wheelchair.  She states that insurance will cover the cost.  She is using a rental wheelchair and can purchase with an order.

## 2015-05-05 ENCOUNTER — Telehealth: Payer: Self-pay | Admitting: Neurology

## 2015-05-05 ENCOUNTER — Other Ambulatory Visit: Payer: Self-pay | Admitting: Neurology

## 2015-05-05 NOTE — Telephone Encounter (Signed)
Dondra SpryGail called and requested to speak with Kara MeadEmma RN regarding the patients MRI on Thursday. She would like to request some medication for the patient before the procedure. Please call and advise.

## 2015-05-05 NOTE — Telephone Encounter (Signed)
She is scheduled at [facility] per her request.  If she needs meds please give her xanex prior to scan.  Did I answer what you are asking?

## 2015-05-05 NOTE — Telephone Encounter (Signed)
Spoke with pt who stated she has not gone to get MRI and believes she "does not need MRI". I advised her that Dr. Lucia GaskinsAhern would like her to have this done and she then was confused between different tests she has had done and which doctor's have done which test. Unable to tell me where she was going for MRI for "her appointment on Tuesday next week". She stated "It's [address], I will know when I get there". I told her I would have to talk with Dr. Lucia GaskinsAhern and call her back. Pt verbalized understanding.   I also told her Dr. Andrey CampanileWilson office would not give her something before her MRI because he wanted Dr. Lucia GaskinsAhern to do this since she is the once who ordered the MRI. Pt stated she was not aware and stated "I asked them to fill prescription at my pharmacy and that never happened". I told her Dr. Andrey CampanileWilson office has been trying to contact her also about this issue and that I spoke with Inocencio HomesGayle from their office about this. Pt aware.

## 2015-05-05 NOTE — Telephone Encounter (Signed)
Left detailed VM for pt to see if she has tried to have MRI and about getting medication before MRI. Also told her to Call Dr. Ermalene SearingMartin's office back because they have been trying to reach her as well. Gave [facility] phone number and office hours.

## 2015-05-05 NOTE — Telephone Encounter (Signed)
Left message for pt to call back about getting medication (xanax) before MRI. Gave [facility] phone number and office hours.

## 2015-05-05 NOTE — Telephone Encounter (Signed)
Spoke with Inocencio HomesGayle who stated pt came to their office asking for something for MRI because she is claustrophobic. Gayle asked Dr. Andrey CampanileWilson who stated she needed to receive medication from Dr. Lucia GaskinsAhern since she was the one who ordered test. I told Inocencio HomesGayle I would ask Dr. Lucia GaskinsAhern and let her know. I told her I would also call pt to find out if pt went to try and have MRI done or not. Gayle verbalized understanding.   Inocencio HomesGayle stated they have tried to contact pt many times, but unable to reach her.

## 2015-05-05 NOTE — Telephone Encounter (Signed)
Talked with nurse at Dr. Trevor MaceAhern's office and she will look into pre med for MRI.

## 2015-05-06 NOTE — Telephone Encounter (Signed)
Ok. It looks like Dr. Trevor MaceAhern's office will take care of it.  Please let me know if this is not the case.

## 2015-05-07 ENCOUNTER — Other Ambulatory Visit: Payer: Self-pay | Admitting: Internal Medicine

## 2015-05-07 ENCOUNTER — Other Ambulatory Visit: Payer: Self-pay | Admitting: Neurology

## 2015-05-07 DIAGNOSIS — G20 Parkinson's disease: Secondary | ICD-10-CM

## 2015-05-07 DIAGNOSIS — R262 Difficulty in walking, not elsewhere classified: Secondary | ICD-10-CM

## 2015-05-07 MED ORDER — ALPRAZOLAM 0.5 MG PO TABS: ORAL_TABLET | ORAL | Status: DC

## 2015-05-07 NOTE — Telephone Encounter (Addendum)
Left detailed VM to let pt know Rx for Xanax 0.5 (2 tablets) is ready to be picked up at front desk. Asked her to bring valid photo ID with her and that she will have to sign that she picked up prescription. Gave her [facility] phone number and office hours and told her to call back if she had any more questions.   Placed Rx for Xanax up front on 05/07/15.

## 2015-05-07 NOTE — Telephone Encounter (Signed)
Let patient know I gave her 2 Xanax. Take 1-2 before MRI. They can be sedating, she needs an escort to imaging and she cannot drive. thanks

## 2015-05-11 NOTE — Telephone Encounter (Signed)
Left another detailed VM to let pt know Rx for Xanax 0.5 (2 tablets) is ready to be picked up at front desk. Told her she needed to make sure she had someone to drive her because this will make her drowsy. Asked her to bring valid photo ID with her and that she will have to sign that she picked up prescription. Gave her [facility] phone number and told her to call back if she had any more questions.   Check at the front and Rx is still waiting to be picked up on 05/11/15.

## 2015-05-13 ENCOUNTER — Telehealth: Payer: Self-pay | Admitting: Neurology

## 2015-05-13 ENCOUNTER — Encounter: Payer: Self-pay | Admitting: Neurology

## 2015-05-13 ENCOUNTER — Ambulatory Visit (INDEPENDENT_AMBULATORY_CARE_PROVIDER_SITE_OTHER): Payer: PPO | Admitting: Neurology

## 2015-05-13 VITALS — BP 141/87 | HR 87 | Temp 97.6°F | Ht 61.0 in | Wt 150.8 lb

## 2015-05-13 DIAGNOSIS — G20 Parkinson's disease: Secondary | ICD-10-CM | POA: Diagnosis not present

## 2015-05-13 MED ORDER — CARBIDOPA-LEVODOPA 25-100 MG PO TABS: 0.5000 | ORAL_TABLET | Freq: Three times a day (TID) | ORAL | Status: DC

## 2015-05-13 NOTE — Telephone Encounter (Signed)
The Patient wants a prescription for a wheel chair. Please call when it is ready. Thank you!

## 2015-05-13 NOTE — Progress Notes (Signed)
[facility]    Provider:  Dr Lucia Gaskins Referring Provider: Yolanda Manges, DO Primary Care Physician:  Evelena Peat, DO  [facility]    Provider: Dr Lucia Gaskins Referring Provider: Yolanda Manges, DO Primary Care Physician: Evelena Peat, DO  CC: tremor  Interval update 05/12/2014: She had the radioactive iodine treatment and tremor is much improved. She still has bilateral upper resting tremor. She says she is walking a little better but husband disagrees. Husband says her knee hurts and stays sore. Husband feels her voice volume is lower and he can't hear her. She shuffles. Her sense of smell is poor. She is off balance.  Discussed parkinson's disease, what it is and the symptoms. Discussed that Parkinson's is a neurodegenerative disease which involves dopamine in the brain. Progression is variable. People with Parkinson's can display tremor, shuffling, stooped posture, impaired balance and falls, lowered voice volume, constipation, decreased smell sensation. Risk of falls is especially problematic and vigilance as needed. Patient should not always use her walker. Provided patient handout on Parkinson's disorder, online organizations as well as Parkinson's support group that meets here in [place] with contact information.  We'll start Sinemet low dose. They are to call me back in 2 weeks to discuss how patient is responding. Discussed common side effects including nausea and GI side effects, orthostatic hypotension, dizziness and increased risk of falls. They are to stop the medication if significant side effects occur.     04/14/2015: Nicole Baxter is a 74 y.o. female here as a referral from Dr. Andrey Campanile for tremor. She has a past medical history of recently diagnosed hyperthyroidism. She is here with her husband who provides much of the information. She also has a past medical history of essential tremor, hypertension, asthma, hyperlipidemia, asthma,  diabetes, osteoarthritis. No history of dopamine blocking agents. 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  She has a daily tremor. She had a tremor in the past, similar tremor but it went away. It came back last year. She has good days and bad days with the tremor. Her right leg sometimes won't move. There are some days when it is better. She shuffles and is slow walking. She can't move when walking sometimes, she freezes. The tone of her voice is lower, husband says she used to talk much louder. She doesn't yell anymore. She denies decreased smell sensation. She has constipation. Her hand writing is terrible. She is able to eat with her tremor. Tremor is better with movement. Worse at rest. Answer legs shake, feels like nervousness. Mother has a tremor. Cousins have tremors so they think it is a family issue. She is hyperthyroid and is going to [facility] for treatment. No FHx of PD. She uses a walker at baseline. Walking has worsened.  Reviewed notes, labs and imaging from outside physicians, which showed: Notes state the patient has been seen by neurology in the past however I cannot find a note in Epic. She follows with Dr. Andrey Campanile who notes resting tremor, rigidity and bradykinesia concerning for Parkinson's disease. She was started on gabapentin 100 mg 3 times a day and referred to neurology. Notes state that years ago she was seen by neurologist and advised to take Benadryl for tremor. Dr. Andrey Campanile advised her to stop due to risk of anticholinergic symptoms in the elderly. TSH decreased 0.132 weeks ago, liver function  tests with elevated AST and a LT, BMP unremarkable   Review of Systems: Patient complains of symptoms per HPI as well as the following symptoms: no CP, no SOB, no dizziness. Pertinent negatives per HPI. All others  negative.   History   Social History  . Marital Status: Married    Spouse Name: N/A  . Number of Children: 5  . Years of Education: 12   Occupational History  . Not on file.   Social History Main Topics  . Smoking status: Never Smoker   . Smokeless tobacco: Never Used  . Alcohol Use: No  . Drug Use: No  . Sexual Activity: No   Other Topics Concern  . Not on file   Social History Narrative   Worked at [employer] 31 years. Married.Active, out and about q day.   5 children, 1 with sarcoidosis, 1 with COPD.   Caffeine use: Drinks a 6 pack of soda per week    Family History  Problem Relation Age of Onset  . Heart disease Mother 67    died of MI @ 42  . Alcohol abuse Father   . Cancer Brother   . Colon cancer Neg Hx     Past Medical History  Diagnosis Date  . Obesity   . Hyperglycemia     isolated FBS 122, 95, 104 (Random 138)  . 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 GERD (gastroesophageal reflux disease)     controlled on prilosec  . Hyperlipidemia     on pravastatin  . Hypertension     controlled on 2 agents  . Tremor     Past Surgical History  Procedure Laterality Date  . Colonoscopy      Current Outpatient Prescriptions  Medication Sig Dispense Refill  . acetaminophen (TYLENOL) 500 MG tablet Take 500 mg by mouth 2 (two) times daily.    Marland Kitchen albuterol (PROAIR HFA) 108 (90 BASE) MCG/ACT inhaler Inhale 2 puffs into the lungs every 4 (four) hours as needed for wheezing. 3.7 g 3  . ALPRAZolam (XANAX) 0.5 MG tablet Take 1-2 before MRI. Do not drive. 2 tablet 0  . capsaicin (ZOSTRIX) 0.025 % cream Apply topically 2 (two) times daily. (Patient not taking: Reported on 04/14/2015) 60 g 0  . Fluticasone-Salmeterol (ADVAIR) 500-50 MCG/DOSE AEPB Inhale 1 puff into the lungs 2 (two) times daily as needed (Asthma).    . hydrochlorothiazide (HYDRODIURIL) 12.5 MG tablet Take 1 tablet (12.5 mg total) by mouth daily. 90 tablet 1  . Investigational vitamin D  600 UNITS capsule SWOG S0812 Take 600 Units by mouth daily. Take with food.    Marland Kitchen lisinopril (PRINIVIL,ZESTRIL) 20 MG tablet Take 1 tablet (20 mg total) by mouth daily. 90 tablet 1  . omeprazole (PRILOSEC) 40 MG capsule Take 40 mg by mouth daily.       No current facility-administered medications for this visit.    Allergies as of 05/13/2015  . (No Known Allergies)    Vitals: There were no vitals taken for this visit. Last Weight:  Wt Readings from Last 1 Encounters:  04/14/15 149 lb 3.2 oz (67.677 kg)   Last Height:   Ht Readings from Last 1 Encounters:  04/14/15  (1.549 m)     Coordination: bradykinesia on finger tap  Gait:   Decreased arm swing, re-emergent tremor. Bradykinesia.     Motor Observation:  Resting tremor in the bilateral upper extremities worse than lower extremities with postural and  intention components. Much improved since radioactive iodine treatment. Still with bilateral resting tremor.  Tone:  Increased tone in the upper extremities  Posture:  Slightly stooped   Strength:  Strength is intact in the upper and lower limbs.    Sensation: intact to LT      Assessment/Plan: Nicole Baxter is a 74 y.o. female here as a referral from Dr. Andrey Campanile for tremor. She has a past medical history of recently diagnosed hyperthyroidism, essential tremor, hypertension, asthma, hyperlipidemia, diabetes, osteoarthritis. No history of dopamine blocking agents. 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  Patient does report freezing, shuffling of gait, resting tremor, hypophonia, impaired smell. Exam does show bradykinesia on finger taps and walking, increased tone in the upper extremities and resting tremor in upper extremities. What is unclear is how much her hyperthyroidism is contributing to the  tremor and other symptoms. Discussed this with patient and her husband. Will start low-dose Sinemet. See history of present illness more details on discussion with patient and her husband about Parkinson's,  how medications work and side effects.    Naomie Dean, MD  [facility] [address] [place], [place] [address]  Phone [phone] Fax [phone]  A total of 30 minutes was spent face-to-face with this patient and her husband. Over half this time was spent on counseling patient on the PD diagnosis and different diagnostic and therapeutic options available.

## 2015-05-13 NOTE — Patient Instructions (Addendum)
Overall you are doing fairly well but I do want to suggest a few things today:   Remember to drink plenty of fluid, eat healthy meals and do not skip any meals. Try to eat protein with a every meal and eat a healthy snack such as fruit or nuts in between meals. Try to keep a regular sleep-wake schedule and try to exercise daily, particularly in the form of walking, 20-30 minutes a day, if you can.   As far as your medications are concerned, I would like to suggest; Start Carbidopa/levodopa (Sinemet) 1/2 [ill 3x a day 4 hours apart. For example, 8am,noon and 4pm. -Try to separate Sinemet from food (especially protein-rich foods like meat, dairy, eggs) by about 30-60 mins - this will help the absorption of the medication. If you have some nausea with the medication, you can take it with some light food like crackers or ginger ale  I would like to see you back in 6 weeks, sooner if we need to. Please call us with any interim questions, concerns, problems, updates or refill requests.   There is an excellent Parkinson's support group in [place]. It is called "[employer]" and meets once a month. Please call Amy Marriot or Willa Frater at [phone] for more information. There is no charge/fee for joining the group.   Please also call us for any test results so we can go over those with you on the phone.  My clinical assistant and will answer any of your questions and relay your messages to me and also relay most of my messages to you.   Our phone number is [phone]. We also have an after hours call service for urgent matters and there is a physician on-call for urgent questions. For any emergencies you know to call 911 or go to the nearest emergency room

## 2015-05-14 ENCOUNTER — Encounter: Payer: Self-pay | Admitting: *Deleted

## 2015-05-14 ENCOUNTER — Other Ambulatory Visit: Payer: Self-pay | Admitting: *Deleted

## 2015-05-14 DIAGNOSIS — G20 Parkinson's disease: Secondary | ICD-10-CM

## 2015-05-14 NOTE — Telephone Encounter (Signed)
Nicole Baxter - can you please ask Nicole Baxter or others in the office how we prescribe this please? Is it a DME (durable medical equipment) order? Does it go through [facility]?  I will order a manual wheelchair for her. thanks

## 2015-05-14 NOTE — Telephone Encounter (Signed)
Dr. Lucia Gaskins, Referral to PT for eval of need for wheelchair faxed to [facility] at 4:02pm on 05/14/15. Received fax confirmation.   Thank you!

## 2015-05-14 NOTE — Progress Notes (Signed)
Faxed PT referral for eval for wheelchair to [facility] at [phone] at 4:20pm and received fax confirmation.

## 2015-06-02 ENCOUNTER — Telehealth: Payer: Self-pay | Admitting: Internal Medicine

## 2015-06-02 NOTE — Telephone Encounter (Signed)
Call to patient to confirm appointment for 06/03/15 at 3:15 lmtcb

## 2015-06-03 ENCOUNTER — Ambulatory Visit (INDEPENDENT_AMBULATORY_CARE_PROVIDER_SITE_OTHER): Payer: PPO | Admitting: Internal Medicine

## 2015-06-03 ENCOUNTER — Encounter: Payer: Self-pay | Admitting: Internal Medicine

## 2015-06-03 VITALS — BP 137/83 | HR 77 | Temp 98.1°F | Ht 61.0 in | Wt 152.9 lb

## 2015-06-03 DIAGNOSIS — Z1239 Encounter for other screening for malignant neoplasm of breast: Secondary | ICD-10-CM

## 2015-06-03 DIAGNOSIS — E0580 Other thyrotoxicosis without thyrotoxic crisis or storm: Secondary | ICD-10-CM | POA: Diagnosis not present

## 2015-06-03 DIAGNOSIS — E119 Type 2 diabetes mellitus without complications: Secondary | ICD-10-CM | POA: Diagnosis not present

## 2015-06-03 DIAGNOSIS — I10 Essential (primary) hypertension: Secondary | ICD-10-CM

## 2015-06-03 DIAGNOSIS — G20 Parkinson's disease: Secondary | ICD-10-CM | POA: Diagnosis not present

## 2015-06-03 DIAGNOSIS — Z0000 Encounter for general adult medical examination without abnormal findings: Secondary | ICD-10-CM

## 2015-06-03 DIAGNOSIS — E0590 Thyrotoxicosis, unspecified without thyrotoxic crisis or storm: Secondary | ICD-10-CM

## 2015-06-03 DIAGNOSIS — M8580 Other specified disorders of bone density and structure, unspecified site: Secondary | ICD-10-CM

## 2015-06-03 DIAGNOSIS — M170 Bilateral primary osteoarthritis of knee: Secondary | ICD-10-CM

## 2015-06-03 LAB — POCT GLYCOSYLATED HEMOGLOBIN (HGB A1C): Hemoglobin A1C: 5.3

## 2015-06-03 LAB — GLUCOSE, CAPILLARY: Glucose-Capillary: 91 mg/dL (ref 65–99)

## 2015-06-03 MED ORDER — DICLOFENAC SODIUM 1 % TD GEL: 2.0000 g | Freq: Four times a day (QID) | TRANSDERMAL | Status: DC

## 2015-06-03 NOTE — Assessment & Plan Note (Addendum)
BP Readings from Last 3 Encounters:  06/03/15 113/96  05/13/15 141/87  04/14/15 112/68    Lab Results  Component Value Date   NA 140 03/02/2015   K 3.7 03/02/2015   CREATININE 0.73 03/02/2015    Assessment: Blood pressure control:  well controlled Progress toward BP goal:   DBP slightly elevated Comments: Compliant with medications.  Plan: Medications:  continue current medications:  HCTZ 12.5mg  daily, lisinopril 20mg  daily Other plans: RTC in 4-6 months for follow-up.

## 2015-06-03 NOTE — Assessment & Plan Note (Signed)
Colonoscopy:  Completed DEXA:  Completed Mammo:  Refer today Zostavax:  Insurance did not cover in the past.  Will attempt to re-prescribe at future visit now that she has new insurance provider

## 2015-06-03 NOTE — Assessment & Plan Note (Addendum)
Right knee has been causing pain.  No swelling today.  Mild TTP on exam but does not limit ROM.  She has a compression brace on that she says helps.  Steroids haven't worked in the past.  She is not interested in surgery. - STOP capsaicin and START voltaren gel QID prn to knee - continue  Tylenol prn (she is taking BID, can increase to TID if needed) - refer back to sports med for possible hyaluronate injection since steroid injection have not worked

## 2015-06-03 NOTE — Assessment & Plan Note (Addendum)
Reviewed results of DEXA.  She reports compliance with OTC calcium and vitamin D.

## 2015-06-03 NOTE — Progress Notes (Signed)
Subjective:    Patient ID: Nicole Baxter, female    DOB: [birth year], 74 y.o.   MRN: [id number]  HPI Comments: Nicole Baxter is a 74 year old woman with PMH as below here for follow-up of chronic conditions.  Please see problem based charting for assessment and plan.    Past Medical History  Diagnosis Date  . Obesity   . Hyperglycemia     isolated FBS 122, 95, 104 (Random 138)  . 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 GERD (gastroesophageal reflux disease)     controlled on prilosec  . Hyperlipidemia     on pravastatin  . Hypertension     controlled on 2 agents  . Tremor    Current Outpatient Prescriptions on File Prior to Visit  Medication Sig Dispense Refill  . acetaminophen (TYLENOL) 500 MG tablet Take 500 mg by mouth 2 (two) times daily.    Marland Kitchen albuterol (PROAIR HFA) 108 (90 BASE) MCG/ACT inhaler Inhale 2 puffs into the lungs every 4 (four) hours as needed for wheezing. 3.7 g 3  . ALPRAZolam (XANAX) 0.5 MG tablet Take 1-2 before MRI. Do not drive. 2 tablet 0  . carbidopa-levodopa (SINEMET) 25-100 MG per tablet Take 0.5 tablets by mouth 3 (three) times daily. Take 4 hours apart. 90 tablet 6  . Fluticasone-Salmeterol (ADVAIR) 500-50 MCG/DOSE AEPB Inhale 1 puff into the lungs 2 (two) times daily as needed (Asthma).    . hydrochlorothiazide (HYDRODIURIL) 12.5 MG tablet Take 1 tablet (12.5 mg total) by mouth daily. 90 tablet 1  . Investigational vitamin D 600 UNITS capsule SWOG S0812 Take 600 Units by mouth daily. Take with food.    Marland Kitchen lisinopril (PRINIVIL,ZESTRIL) 20 MG tablet Take 1 tablet (20 mg total) by mouth daily. 90 tablet 1  . omeprazole (PRILOSEC) 40 MG capsule Take 40 mg by mouth daily.       No current facility-administered medications on file prior to visit.    Review of Systems  Constitutional: Negative for fever, chills, appetite change, fatigue and unexpected weight change.  Respiratory: Negative for shortness of breath.   Cardiovascular: Negative for  chest pain, palpitations and leg swelling.  Gastrointestinal: Negative for nausea, vomiting, abdominal pain, diarrhea, constipation and blood in stool.  Endocrine: Negative for cold intolerance, heat intolerance, polydipsia, polyphagia and polyuria.  Genitourinary: Negative for dysuria, hematuria and difficulty urinating.       Filed Vitals:   06/03/15 1530 06/03/15 1637  BP: 113/96 137/83  Pulse: 77   Temp: 98.1 F (36.7 C)   TempSrc: Oral   Height:  (1.549 m)   Weight: 152 lb 14.4 oz (69.355 kg)   SpO2: 100%      Objective:   Physical Exam  Constitutional: She is oriented to person, place, and time. She appears well-developed. No distress.  HENT:  Head: Normocephalic and atraumatic.  Mouth/Throat: Oropharynx is clear and moist. No oropharyngeal exudate.  Eyes: EOM are normal. Pupils are equal, round, and reactive to light.  Neck: Neck supple. No thyromegaly present.  Cardiovascular: Normal rate, regular rhythm and normal heart sounds.  Exam reveals no gallop and no friction rub.   No murmur heard. Pulmonary/Chest: Effort normal and breath sounds normal. No respiratory distress. She has no wheezes. She has no rales.  Abdominal: Soft. Bowel sounds are normal. She exhibits no distension. There is no tenderness. There is no rebound.  Musculoskeletal: Normal range of motion. She exhibits tenderness. She exhibits no edema.  Left knee minimally TTP; no pain with valgus or varus stress; no swelling.  Neurological: She is alert and oriented to person, place, and time. No cranial nerve deficit. She exhibits abnormal muscle tone.  Increased tone and resting tremor - upper extremities.   Skin: Skin is warm. She is not diaphoretic.  Psychiatric: She has a normal mood and affect. Her behavior is normal. Judgment and thought content normal.  Vitals reviewed.         Assessment & Plan:  Please see problem based charting for assessment and plan.

## 2015-06-03 NOTE — Assessment & Plan Note (Addendum)
Evaluated by endocrine (Dr. Elvera LennoxGherghe) in April 2016 with findings c/w mild TMNG.  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  - TFTs today (it has been about 6 weeks since RAI) - follow-up with Dr. Elvera LennoxGherghe

## 2015-06-03 NOTE — Assessment & Plan Note (Signed)
Lab Results  Component Value Date   HGBA1C 5.3 06/03/2015   HGBA1C 5.6 01/28/2015   HGBA1C 5.5 10/15/2014     Assessment: Diabetes control:  good control Progress toward A1C goal:   at goal  Plan: Medications: none, diet controlled Home glucose monitoring: none Frequency:   Timing:   Other plans: recheck in 6 months

## 2015-06-03 NOTE — Patient Instructions (Addendum)
1. Please try Voltaren gel for your knee pain instead of Capsaicin.  You can use it up to four times per day.  You can increase your Tylenol 650mg  to three times per day when needed.  I will refer you back to Sports Medicine to see if there is another injection they can try besides steroid.   2. Please take all medications as prescribed.  Please pickup your Sinemet from [facility].   3. If you have worsening of your symptoms or new symptoms arise, please call the clinic [phone]), or go to the ER immediately if symptoms are severe.   We will call you to schedule mammogram.  Please return to see me in 4-6 months.

## 2015-06-03 NOTE — Assessment & Plan Note (Signed)
She was evaluated by neuro and Sinemet prescribed, however, she says it was not at the pharmacy when she went to get it and she never notified neuro office.  I called [facility] to confirm they have the prescription, which they do.  The copay is $2.90 and patient is agreeable to pick this med up and start taking it. - Sinemet per neuro instruction - Follow-up with neuro as previously scheduled on 07/27

## 2015-06-04 LAB — T4, FREE: Free T4: 0.84 ng/dL (ref 0.80–1.80)

## 2015-06-04 LAB — TSH: TSH: 1.176 u[IU]/mL (ref 0.350–4.500)

## 2015-06-04 LAB — T3, FREE: T3 FREE: 2.8 pg/mL (ref 2.3–4.2)

## 2015-06-04 NOTE — Progress Notes (Signed)
Medicine attending: Medical history, presenting problems, physical findings, and medications, reviewed with Dr Alex Wilson on the day of the patient visit   and I concur with her evaluation and management plan. 

## 2015-06-05 ENCOUNTER — Telehealth: Payer: Self-pay | Admitting: Internal Medicine

## 2015-06-05 DIAGNOSIS — E0590 Thyrotoxicosis, unspecified without thyrotoxic crisis or storm: Secondary | ICD-10-CM

## 2015-06-05 NOTE — Telephone Encounter (Signed)
I spoke with patient regarding normal TFTs s/p RAI but explained that she may become hypothyroid.  I have communicated with Dr. Elvera LennoxGherghe who recommends TFT recheck in 4-6 weeks and patient can follow with her or with me for thyroid issues.  [name] [facility] and continued follow-up.  She agrees to follow-up for lab work in 6 weeks.  I will forward message to front desk to arrange appt.

## 2015-06-19 ENCOUNTER — Encounter: Payer: Self-pay | Admitting: Family Medicine

## 2015-06-19 ENCOUNTER — Ambulatory Visit (INDEPENDENT_AMBULATORY_CARE_PROVIDER_SITE_OTHER): Payer: PPO | Admitting: Family Medicine

## 2015-06-19 VITALS — BP 119/77 | Ht 62.0 in | Wt 167.0 lb

## 2015-06-19 DIAGNOSIS — M170 Bilateral primary osteoarthritis of knee: Secondary | ICD-10-CM

## 2015-06-19 MED ORDER — TRAMADOL HCL 50 MG PO TABS: 50.0000 mg | ORAL_TABLET | Freq: Three times a day (TID) | ORAL | Status: DC | PRN

## 2015-06-19 NOTE — Progress Notes (Signed)
Patient ID: Nicole Baxter, female   DOB: Jun 29, 1941, 75 y.o.   MRN: [id number] [facility] Attending Note: I have seen and examined this patient with the medical student. I have  reviewed the history, physical examination, assessment and plan as documented in the medical student's note.  I agree with the medical student's note and findings, assessment and treatment plan as documented with the following additions or changes: We discussed options which are limited. Severe OA bilateral knees. No response to last CSI therapy. I doubt visco-supplementation would be useful but told her I recommend she see an orthopedist---they may want to try that but ultimately she likely needs TKR. Recent complication however is her recent dx of parkinson's disease. She says pain is so bad and she is unable to do anything she wants to do so she would like to consider surgery. Her Husband had THR by Dr. Eulah Pont and she requests to see him which we will set up. I will give her some tramadol for pain but further refills should come from her PCP.

## 2015-06-19 NOTE — Assessment & Plan Note (Addendum)
Patient continues to have bilateral knee pain. No h No response with 2 trials of corticosteroid injections. Given the significant loss of joint space, hyaluronate injections will likely not relieve pain. She is interested in knee replacement surgery.  - Referral to Dr. Eulah Pont for evaluation for knee replacement - Tramadol q8hr PRN for pain

## 2015-06-19 NOTE — Progress Notes (Signed)
  LEJLA MOESER - 74 y.o. female MRN [id number]  Date of birth: 11-Apr-1941  SUBJECTIVE:  Including CC & ROS.  No chief complaint on file. Patient is a 74 yo female with a PMH significant for diabetes, parkinsons (dx in June 2016), HTN, bilateral knee osteoarthritis who presents for bilateral knee pain. She complains of persistant pain in both knees that is worse with prolonged standing, walking for long distances, and stairs. She ambultates with a walker. She reports that her knees occasionally lock up when she is walking. She hears her knees pop often. Capsacin, volteran gel, 650 mg Tylenol have not relieved pain.   She has had 2 trials of corticosteroid injections with no response. Knee radiographs from July 2015 show that the medial joint spaces bilaterally were significantly narrowed (despite the fact that the knee radiographs were not standing views). Femoral condyle and peripatellar osteophytes were seen.  At her last appointment in 06/2014, they discussed visco-supplementation. She is now interested in knee-replacement surgery.  HISTORY: Past Medical, Surgical, Social, and Family History Reviewed & Updated per EMR.   Pertinent Historical Findings include: Per HPI.  DATA REVIEWED: July 2015 x ray (not standing views): Significant narrowing in medial joint spaces bilaterally were significantly. Femoral condyle and peripatellar osteophytes were seen.   PHYSICAL EXAM:  VS: BP:119/77 mmHg  HR: bpm  TEMP: ( )  RESP:   HT:5\' 2"  (157.5 cm)   WT:167 lb (75.751 kg)  BMI:30.6 PHYSICAL EXAM: Gen: NAD Skin: no erythema, rashes noted MSK: Knees: Left knee: No effusion. ROM reduced (15 degrees extension to 100 degrees on flexion) with crepitus and popping. Medial joint line tenderness. No pain with patellar compression. No pain or instability with varus or valgus stress. Negative Mcmurray's. Right knee: No effusion. ROM is 15 degrees extension to 110 degrees on flexion. No pain or instability  with valgus or varus stress. No pain with patellar compression. Negative mcmurray's. Gait: shuffling gait, walks with walker  Neuro: Patient has a resting pill-rolling tremor in both upper extremities, worsens with intention and when upset.    ASSESSMENT & PLAN: See problem based charting & AVS for pt instructions.

## 2015-06-19 NOTE — Patient Instructions (Signed)
Dr Richardson Landry Tuesday 06/30/15 at 10am Arrival time is 945am [address] [place] [address] [phone]

## 2015-06-24 ENCOUNTER — Encounter: Payer: Self-pay | Admitting: Neurology

## 2015-06-24 ENCOUNTER — Ambulatory Visit (INDEPENDENT_AMBULATORY_CARE_PROVIDER_SITE_OTHER): Payer: PPO | Admitting: Neurology

## 2015-06-24 ENCOUNTER — Telehealth: Payer: Self-pay | Admitting: *Deleted

## 2015-06-24 VITALS — BP 129/95 | HR 80 | Temp 98.3°F | Ht 62.0 in

## 2015-06-24 DIAGNOSIS — G20 Parkinson's disease: Secondary | ICD-10-CM

## 2015-06-24 MED ORDER — CARBIDOPA-LEVODOPA 25-100 MG PO TABS: 1.0000 | ORAL_TABLET | Freq: Three times a day (TID) | ORAL | Status: DC

## 2015-06-24 NOTE — Patient Instructions (Signed)
Remember to drink plenty of fluid, eat healthy meals and do not skip any meals. Try to eat protein with a every meal and eat a healthy snack such as fruit or nuts in between meals. Try to keep a regular sleep-wake schedule and try to exercise daily, particularly in the form of walking, 20-30 minutes a day, if you can.   As far as your medications are concerned, I would like to suggest: Increase the Sinemet to 1 pill three times a day. -Try to separate Sinemet from food (especially protein-rich foods like meat, dairy, eggs) by about 30-60 mins - this will help the absorption of the medication. If you have some nausea with the medication, you can take it with some light food like crackers or ginger ale  I would like to see you back in 3 months, sooner if we need to. Please call us with any interim questions, concerns, problems, updates or refill requests.   Please also call us for any test results so we can go over those with you on the phone.  My clinical assistant and will answer any of your questions and relay your messages to me and also relay most of my messages to you.   Our phone number is [phone]. We also have an after hours call service for urgent matters and there is a physician on-call for urgent questions. For any emergencies you know to call 911 or go to the nearest emergency room

## 2015-06-24 NOTE — Progress Notes (Addendum)
[facility]    Provider: Dr Lucia Gaskins Referring Provider: Yolanda Manges, DO Primary Care Physician: Evelena Peat, DO  CC: tremor  Addendum 07/22/2015:  Called to see how she is doing. She hasn't taken the Sinemet since yesterday. Not taking it three times a day as prescribed. Sounds like it is difficullt for patient to remember to take her medication. She does say when she takes the medication it helps. We can probably try a neupro patch instead of the sinemet (and/or Azilect once daily). Discussed side effects including dizziness, nausea, risk of falls, hypotension, compulsive behaviors. Stop for any side effects and call me.   Interval update 06/24/2015: They are taking 1/2 pill of the sinemet with breakfast. She forgot she is not supposed to take it with food. It helps the tremor. No side effects from the Sinemet. Will increase the sinemet to a whole pill three times a day. She did not take her sinemet this morning. Symptoms brain they're Sinemet with some the next time they come, we can take it in clinic and evaluate response. But if the Sinemet appears to help with her tremor, and this confirms the diagnosis of Parkinson's. May consider adding Azilect concurrently.  Interval update 05/12/2014: She had the radioactive iodine treatment and tremor is much improved. She still has bilateral upper resting tremor. She says she is walking a little better but husband disagrees. Husband says her knee hurts and stays sore. Husband feels her voice volume is lower and he can't hear her. She shuffles. Her sense of smell is poor. She is off balance.  Discussed parkinson's disease, what it is and the symptoms. Discussed that Parkinson's is a neurodegenerative disease which involves dopamine in the brain. Progression is variable. People with Parkinson's can display tremor, shuffling, stooped posture, impaired balance and falls, lowered voice volume, constipation, decreased smell sensation. Risk  of falls is especially problematic and vigilance as needed. Patient should not always use her walker. Provided patient handout on Parkinson's disorder, online organizations as well as Parkinson's support group that meets here in [place] with contact information.  We'll start Sinemet low dose. They are to call me back in 2 weeks to discuss how patient is responding. Discussed common side effects including nausea and GI side effects, orthostatic hypotension, dizziness and increased risk of falls. They are to stop the medication if significant side effects occur.   04/14/2015: [name] is a 74 y.o. female here as a referral from Dr. Andrey Campanile for tremor. She has a past medical history of recently diagnosed hyperthyroidism. She is here with her husband who provides much of the information. She also has a past medical history of essential tremor, hypertension, asthma, hyperlipidemia, asthma, diabetes, osteoarthritis. No history of dopamine blocking agents. 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  She has a daily tremor. She had a tremor in the past, similar tremor but it went away. It came back last year. She has good days and bad days with the tremor. Her right leg sometimes won't move. There are some days when it is better. She shuffles and is slow walking. She can't move when walking sometimes, she freezes. The tone of her voice is lower, husband says she used to talk much louder. She doesn't yell anymore. She denies decreased smell sensation. She has constipation. Her hand writing is terrible. She is able to eat  with her tremor. Tremor is better with movement. Worse at rest. Answer legs shake, feels like nervousness. Mother has a tremor. Cousins have tremors so they think it is a family issue. She is hyperthyroid and is going to [facility] for  treatment. No FHx of PD. She uses a walker at baseline. Walking has worsened.  Reviewed notes, labs and imaging from outside physicians, which showed: Notes state the patient has been seen by neurology in the past however I cannot find a note in Epic. She follows with Dr. Andrey Campanile who notes resting tremor, rigidity and bradykinesia concerning for Parkinson's disease. She was started on gabapentin 100 mg 3 times a day and referred to neurology. Notes state that years ago she was seen by neurologist and advised to take Benadryl for tremor. Dr. Andrey Campanile advised her to stop due to risk of anticholinergic symptoms in the elderly. TSH decreased 0.132 weeks ago, liver function tests with elevated AST and a LT, BMP unremarkable  Review of Systems: Patient complains of symptoms per HPI as well as the following symptoms: No chest pain, no shortness of breath. Pertinent negatives per HPI. All others negative.   History   Social History  . Marital Status: Married    Spouse Name: N/A  . Number of Children: 5  . Years of Education: 12   Occupational History  . Not on file.   Social History Main Topics  . Smoking status: Never Smoker   . Smokeless tobacco: Never Used  . Alcohol Use: No  . Drug Use: No  . Sexual Activity: No   Other Topics Concern  . Not on file   Social History Narrative   Worked at [employer] 31 years. Married.Active, out and about q day.   5 children, 1 with sarcoidosis, 1 with COPD.   Caffeine use: Drinks a 6 pack of soda per week    Family History  Problem Relation Age of Onset  . Heart disease Mother 78    died of MI @ 15  . Alcohol abuse Father   . Cancer Brother   . Colon cancer Neg Hx     Past Medical History  Diagnosis Date  . Obesity   . Hyperglycemia     isolated FBS 122, 95, 104 (Random 138)  . 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 GERD (gastroesophageal reflux disease)     controlled on prilosec  . Hyperlipidemia     on pravastatin  .  Hypertension     controlled on 2 agents  . Tremor     Past Surgical History  Procedure Laterality Date  . Colonoscopy      Current Outpatient Prescriptions  Medication Sig Dispense Refill  . acetaminophen (TYLENOL) 500 MG tablet Take 500 mg by mouth 2 (two) times daily.    Marland Kitchen albuterol (PROAIR HFA) 108 (90 BASE) MCG/ACT inhaler Inhale 2 puffs into the lungs every 4 (four) hours as needed for wheezing. 3.7 g 3  . Calcium Carbonate-Vitamin D (CALCIUM 600/VITAMIN D) 600-400 MG-UNIT per tablet Take 2 tablets by mouth daily.    . carbidopa-levodopa (SINEMET) 25-100 MG per tablet Take 0.5 tablets by mouth 3 (three) times daily. Take 4 hours apart. 90 tablet 6  . diclofenac sodium (VOLTAREN) 1 % GEL Apply 2 g topically 4 (four) times daily. 100 g 0  . Fluticasone-Salmeterol (ADVAIR) 500-50 MCG/DOSE AEPB Inhale 1 puff into the lungs 2 (two) times daily as needed (Asthma).    . hydrochlorothiazide (  HYDRODIURIL) 12.5 MG tablet Take 1 tablet (12.5 mg total) by mouth daily. 90 tablet 1  . lisinopril (PRINIVIL,ZESTRIL) 20 MG tablet Take 1 tablet (20 mg total) by mouth daily. 90 tablet 1  . traMADol (ULTRAM) 50 MG tablet Take 1 tablet (50 mg total) by mouth every 8 (eight) hours as needed. 60 tablet 0  . ALPRAZolam (XANAX) 0.5 MG tablet Take 1-2 before MRI. Do not drive. (Patient not taking: Reported on 06/24/2015) 2 tablet 0   No current facility-administered medications for this visit.    Allergies as of 06/24/2015  . (No Known Allergies)    Vitals: BP 129/95 mmHg  Pulse 80  Temp(Src) 98.3 F (36.8 C) (Oral)  Ht 5\' 2"  (1.575 m) Last Weight:  Wt Readings from Last 1 Encounters:  06/19/15 167 lb (75.751 kg)   Last Height:   Ht Readings from Last 1 Encounters:  06/24/15 5\' 2"  (1.575 m)    Coordination: bradykinesia on finger tap  Gait:   Decreased arm swing, re-emergent tremor. Bradykinesia.     Motor Observation:  Resting tremor in the bilateral upper extremities worse than  lower extremities with postural and intention components. Much improved since radioactive iodine treatment. Still with bilateral resting tremor.  Tone:  Increased tone in the upper extremities  Posture:  Slightly stooped   Strength:  Strength is intact in the upper and lower limbs.    Sensation: intact to LT      Assessment/Plan: CLARYCE FRIEL is a 73 y.o. female here as a referral from Dr. Andrey Campanile for tremor. She has a past medical history of recently diagnosed hyperthyroidism, essential tremor, hypertension, asthma, hyperlipidemia, diabetes, osteoarthritis. No history of dopamine blocking agents. 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  Patient does report freezing, shuffling of gait, resting tremor, hypophonia, impaired smell. Exam does show bradykinesia on finger taps and walking, increased tone in the upper extremities and resting tremor in upper extremities. What is unclear is how much her hyperthyroidism is contributing to the tremor and other symptoms. Discussed this with patient and her husband. Will start low-dose Sinemet. See history of present illness more details on discussion with patient and her husband about Parkinson's, how medications work and side effects.   Will increase Sinemet to 1 pill 3x a day. Asked husband to pay attention when she takes the pill and see one tremor improves, how much it improves, and when the Sinemet starts wearing off. Watch for side effects. Symptom home with Azilect samples and asked them to hold onto them, we'll touch base with him at home in 2 weeks and may start azilect .5mg  concurrently  Naomie Dean, MD  [facility] [address] [place], [place] [address]  Phone [phone] Fax [phone]  A total of 15  minutes was spent face-to-face  with this patient. Over half this time was spent on counseling patient on the PD diagnosis and different diagnostic and therapeutic options available.

## 2015-06-24 NOTE — Telephone Encounter (Signed)
Thank you :)

## 2015-06-24 NOTE — Telephone Encounter (Signed)
Spoke w/ Nicole Baxter from [employer] and she talked w/ Herbert Seta from their office and she stated they do have her paperwork and will call the pt this afternoon to get her set up.

## 2015-07-02 ENCOUNTER — Telehealth: Payer: Self-pay | Admitting: *Deleted

## 2015-07-02 NOTE — Telephone Encounter (Signed)
Spoke w/ Pattricia Boss to let her know we received a fax about needing clearance for left total knee replacement from Dr. Mckinley Jewel. I informed her Dr. Lucia Gaskins is out of the office until Monday and wanted to let them know. Was not sure when pt was having surgery. She verbalized understanding and will let them know. They will get the message in the morning.

## 2015-07-06 NOTE — Telephone Encounter (Signed)
Faxed signed surgery clearence back to Dr. Mckinley Jewel at [facility]. Fax# [phone]. Dr. Lucia Gaskins cleared pt from neurological stand-point. Pt needs to be cleared by PCP per Dr. Lucia Gaskins. Received fax confirmation.

## 2015-07-15 ENCOUNTER — Other Ambulatory Visit (INDEPENDENT_AMBULATORY_CARE_PROVIDER_SITE_OTHER): Payer: PPO

## 2015-07-15 DIAGNOSIS — E0580 Other thyrotoxicosis without thyrotoxic crisis or storm: Secondary | ICD-10-CM | POA: Diagnosis not present

## 2015-07-15 DIAGNOSIS — E0590 Thyrotoxicosis, unspecified without thyrotoxic crisis or storm: Secondary | ICD-10-CM

## 2015-07-16 LAB — T3, FREE: T3, Free: 2.5 pg/mL (ref 2.0–4.4)

## 2015-07-16 LAB — TSH: TSH: 3.18 u[IU]/mL (ref 0.450–4.500)

## 2015-07-16 LAB — T4, FREE: Free T4: 0.88 ng/dL (ref 0.82–1.77)

## 2015-07-22 ENCOUNTER — Telehealth: Payer: Self-pay | Admitting: Neurology

## 2015-07-22 MED ORDER — ROTIGOTINE 4 MG/24HR TD PT24: 1.0000 | MEDICATED_PATCH | Freq: Every day | TRANSDERMAL | Status: DC

## 2015-07-22 NOTE — Telephone Encounter (Signed)
Called to see how she is doing. She hasn't the Sinemet since yesterday. Not takin it three times a day as prescribed. Sounds like it difficullt for patient to remember to take her medication. She does say when she takes the medication it helps. We can probably try a neupro patch instead of the sinemet.discussed side effects including dizziness, nausea, risk of falls, hypotension, compulsive behaviors. Stop for any side effects and call me.

## 2015-08-05 ENCOUNTER — Ambulatory Visit (INDEPENDENT_AMBULATORY_CARE_PROVIDER_SITE_OTHER): Payer: PPO | Admitting: Internal Medicine

## 2015-08-05 ENCOUNTER — Encounter: Payer: Self-pay | Admitting: Internal Medicine

## 2015-08-05 ENCOUNTER — Telehealth: Payer: Self-pay | Admitting: *Deleted

## 2015-08-05 VITALS — BP 105/59 | HR 81 | Temp 97.8°F | Ht 62.0 in | Wt 152.7 lb

## 2015-08-05 DIAGNOSIS — M170 Bilateral primary osteoarthritis of knee: Secondary | ICD-10-CM

## 2015-08-05 DIAGNOSIS — G20 Parkinson's disease: Secondary | ICD-10-CM

## 2015-08-05 DIAGNOSIS — E0590 Thyrotoxicosis, unspecified without thyrotoxic crisis or storm: Secondary | ICD-10-CM | POA: Diagnosis not present

## 2015-08-05 DIAGNOSIS — E119 Type 2 diabetes mellitus without complications: Secondary | ICD-10-CM

## 2015-08-05 LAB — GLUCOSE, CAPILLARY: GLUCOSE-CAPILLARY: 121 mg/dL — AB (ref 65–99)

## 2015-08-05 NOTE — Assessment & Plan Note (Signed)
TFTs normal last month (3 months s/p RAI).  She has no symptoms of hyper or hypothyroidism.  Will continue to monitor in [facility] periodically (instead of returning to endocrine, to save patient extra expense/copay). - check TSH in 2 months

## 2015-08-05 NOTE — Assessment & Plan Note (Addendum)
Bradykinesia and tremor from Parkinson's limits mobility.  She is able to tend to most BADLs but needs assistance from husband with some activities (like showering).  A walker or can will not provide the necessary assistance she needs in walking long distances. She needs help with getting around outside of the home and having a wheelchair would make travel outside of the home easier for her.  She has only been taking Sinemet BID and not TID as recommended by Dr. Lucia Gaskins.  She is unsure if she notices a difference in symptoms on Sinement.  Neupro was sent in as an alternative (a patch instead of pill) but the patient did not pick it up and prefers to remain on Sinemet.  She agrees to take TID and note any change in symptoms to report back to neuro. - Sinemet TID - DME Wheelchair

## 2015-08-05 NOTE — Assessment & Plan Note (Addendum)
Nicole Baxter has had little benefit from conservative care and has met with Dr. Percell Miller and decided on left TKR.  She is low-risk for this surgery, 0.4% of major post-op cardiac event.  No further cardiac work-up necessary.  Ok to proceed with surgery.

## 2015-08-05 NOTE — Progress Notes (Signed)
Subjective:    Patient ID: Nicole Baxter, female    DOB: [birth year], 74 y.o.   MRN: [id number]  HPI Comments: Nicole Baxter is a 74 year old woman with PMH as below here for pre-operative evaluation.  Please see problem based charting for assessment and plan.     Past Medical History  Diagnosis Date  . Obesity   . Hyperglycemia     isolated FBS 122, 95, 104 (Random 138)  . 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 GERD (gastroesophageal reflux disease)     controlled on prilosec  . Hyperlipidemia     on pravastatin  . Hypertension     controlled on 2 agents  . Tremor    Current Outpatient Prescriptions on File Prior to Visit  Medication Sig Dispense Refill  . acetaminophen (TYLENOL) 500 MG tablet Take 500 mg by mouth 2 (two) times daily.    Marland Kitchen albuterol (PROAIR HFA) 108 (90 BASE) MCG/ACT inhaler Inhale 2 puffs into the lungs every 4 (four) hours as needed for wheezing. 3.7 g 3  . ALPRAZolam (XANAX) 0.5 MG tablet Take 1-2 before MRI. Do not drive. (Patient not taking: Reported on 06/24/2015) 2 tablet 0  . Calcium Carbonate-Vitamin D (CALCIUM 600/VITAMIN D) 600-400 MG-UNIT per tablet Take 2 tablets by mouth daily.    . carbidopa-levodopa (SINEMET) 25-100 MG per tablet Take 1 tablet by mouth 3 (three) times daily. Take 4 hours apart. 90 tablet 6  . diclofenac sodium (VOLTAREN) 1 % GEL Apply 2 g topically 4 (four) times daily. 100 g 0  . Fluticasone-Salmeterol (ADVAIR) 500-50 MCG/DOSE AEPB Inhale 1 puff into the lungs 2 (two) times daily as needed (Asthma).    . hydrochlorothiazide (HYDRODIURIL) 12.5 MG tablet Take 1 tablet (12.5 mg total) by mouth daily. 90 tablet 1  . lisinopril (PRINIVIL,ZESTRIL) 20 MG tablet Take 1 tablet (20 mg total) by mouth daily. 90 tablet 1  . rotigotine (NEUPRO) 4 MG/24HR Place 1 patch onto the skin daily. Every morning clean the skin on the arm or thigh. Discard the old patch and place a new one. 30 patch 11  . traMADol (ULTRAM) 50 MG tablet Take 1  tablet (50 mg total) by mouth every 8 (eight) hours as needed. 60 tablet 0   No current facility-administered medications on file prior to visit.    Review of Systems  Constitutional: Negative for fever, chills and appetite change.  Respiratory: Negative for shortness of breath.   Cardiovascular: Negative for chest pain, palpitations and leg swelling.  Gastrointestinal: Positive for constipation. Negative for nausea, vomiting, abdominal pain, diarrhea and blood in stool.       Occasional constipation responding to stool softener.  Endocrine: Negative for polydipsia, polyphagia and polyuria.  Genitourinary: Negative for dysuria and difficulty urinating.  Musculoskeletal: Positive for arthralgias.  Neurological: Positive for tremors. Negative for seizures, syncope, weakness and light-headedness.       Filed Vitals:   08/05/15 1327  BP: 105/59  Pulse: 81  Temp: 97.8 F (36.6 C)  TempSrc: Oral  Height:  (1.575 m)  Weight: 152 lb 11.2 oz (69.264 kg)  SpO2: 98%    Objective:   Physical Exam  Constitutional: She is oriented to person, place, and time. She appears well-developed. No distress.  HENT:  Head: Normocephalic and atraumatic.  Mouth/Throat: Oropharynx is clear and moist. No oropharyngeal exudate.  Eyes: EOM are normal. Pupils are equal, round, and reactive to light.  Neck: Normal range  of motion. Neck supple.  Cardiovascular: Normal rate, regular rhythm and normal heart sounds.  Exam reveals no gallop and no friction rub.   No murmur heard. Pulmonary/Chest: Effort normal and breath sounds normal. No respiratory distress. She has no wheezes. She has no rales.  Abdominal: Soft. Bowel sounds are normal. She exhibits no distension. There is no tenderness. There is no rebound.  Musculoskeletal: Normal range of motion. She exhibits no edema or tenderness.  Neurological: She is alert and oriented to person, place, and time. No cranial nerve deficit. She exhibits abnormal  muscle tone.  Increased upper extremity tone.  Rest tremor of upper extremity.  Strength equal and intact.  Skin: Skin is warm. She is not diaphoretic.  Psychiatric: She has a normal mood and affect. Her behavior is normal. Judgment and thought content normal.  Vitals reviewed.         Assessment & Plan:  Please see problem based charting for assessment and plan.

## 2015-08-05 NOTE — Patient Instructions (Signed)
1. Please return to see me in 2 months for follow-up and thyroid labs.   2. Please take all medications as prescribed.    3. If you have worsening of your symptoms or new symptoms arise, please call the clinic ([phone]), or go to the ER immediately if symptoms are severe.

## 2015-08-05 NOTE — Telephone Encounter (Signed)
LVM for Heather Hartsell- [employer] to call back regarding documentation needed for power wheel chair. Gave [facility] phone number, pt name and DOB.

## 2015-08-05 NOTE — Progress Notes (Signed)
Internal Medicine Clinic Attending  Case discussed with Dr. Wilson soon after the resident saw the patient.  We reviewed the resident's history and exam and pertinent patient test results.  I agree with the assessment, diagnosis, and plan of care documented in the resident's note.  

## 2015-08-10 NOTE — Telephone Encounter (Signed)
Called and LVM for Heather to call me back from [employer] regarding power wheelchair. Told her it looks like she was trying to send to a Dr. Evelena Peat. Asked her to call back for clarification. Gave [facility] phone number, pt name and DOB.

## 2015-08-12 NOTE — Telephone Encounter (Signed)
Heather with [employer] is returning your call regarding the patient.

## 2015-08-12 NOTE — Telephone Encounter (Signed)
LVM for Heather to call back regarding pt. Gave [facility] phone number and office hours.

## 2015-08-13 NOTE — Telephone Encounter (Signed)
LVM for Heather to call back about pt. Gave [facility] phone number and office hours. Advised Dr. Lucia Gaskins and I are not in the office on Friday's.

## 2015-08-18 NOTE — Telephone Encounter (Signed)
LVM for pt to call back. Please schedule her for a follow-up and explain she needs a face to face appointment for Dr. Lucia Gaskins for Wheelchair. [employer] contacted [name] about this. Gave [facility] phone number and office hours.

## 2015-08-18 NOTE — Telephone Encounter (Signed)
Called and spoke with Herbert Seta from [employer]. She stated it has to state in Dr. Lucia Gaskins notes that she needs a wheelchair and what type of wheelchair. They cannot take an addendum note if the office visit has been more than 30 days prior. Patient will have to come in for a face-to-face appointment and new office note created to explain need for wheelchair. Requesting power wheelchair. Reason on why normal wheel chair cannot be used must be explained as well.   Back on 06/24/15 I spoke w/ Herbert Seta and they stated they had pt paperwork and will call patient to get her set up. They contacted our office on 08/04/15 stating they needed a face to face and new notes sent. I was unable to reach Heather until 08/18/15.

## 2015-08-19 NOTE — Telephone Encounter (Signed)
Pt called back and said she can not afford the wheelchair and will not be coming in for an appt. For it.

## 2015-08-20 NOTE — Telephone Encounter (Signed)
Matilde Bash-- Custom [employer] coordinator  Phone: [phone] ext [phone] Fax: [phone]

## 2015-08-20 NOTE — Telephone Encounter (Signed)
Janet/Shannon : I requested a wheelchair for a patient. We sent the paperwork over right away. Back on 06/24/15 Kara Mead spoke w/ Herbert Seta and they stated they had pt paperwork and will call patient to get her set up. They contacted our office on 08/04/15 stating they needed a face to face and new notes sent. I was unable to reach Heather until 08/18/15. Now they need a new note for patient and patient cannot afford to come in. Sounds like [employer] was negligent in following up with patient, do we have any recourse?  Kara Mead - what is your contact number for [employer]?

## 2015-09-08 ENCOUNTER — Other Ambulatory Visit: Payer: Self-pay | Admitting: Physician Assistant

## 2015-09-08 NOTE — H&P (Signed)
This is a 74 year-old female who is a new patient to the office.  Presents accompanied by her husband.  Unrelenting, worsening, chronic pain in both knees.  More than two years.  This has progressed to a point of marked functional impact.  Using a cane all the time.  Rest pain and night pain.  Increasing deformity.  The left is a little worse than the right.  Cortisone injections really did not help, other than just a couple of days.  She really would like to proceed with definitive treatment.  She has been told she probably needs a knee replacement.  Comes in for evaluation and to discuss this. Remaining history reviewed.  This is significant for hypertension, but also a relatively new tremor that still tends to come and go.  This looks like a picture of Parkinson's, but she has not gotten that definite diagnosis, although she is being seen by a neurologist.  This definitely has an effect on her balance which makes the situation with her knees even worse because of their pain and increasing deformity.    EXAMINATION: General exam is outlined and included in the chart.  Specifically, 74 year-old female.  Height: 5?1.  Weight: 147 pounds.  She has very typical pill rolling mannerism, both hands, consistent with Parkinson's.  Overall her strength is not bad.  Negative log roll of both hips.  Negative straight leg raise.  Other than the tremor she is neurovascularly intact.  Both knees have about 5-7 degrees of varus, somewhat correctable.  Left knee motion 5-90, right 5-95.  Marked tibiofemoral and patellofemoral crepitus.    X-RAYS: Four view standing x-rays with sizing balls reveal end stage changes, bone on bone medially with marked extensive tricompartmental changes.  Still reasonable bone stock.    DISPOSITION:  The only true viable option she has is bilateral total knee replacements.  I certainly wouldn't do these at the same time, given this individual.  I would feel better to get clearance from  neurology from a point of view of what is going on with her Parkinson's.  Sequential bilateral knee replacements discussed, doing the left side first.  Procedure, risks, benefits and complications reviewed.  Again, I want to get primary care, as well as neurology, clearance before we proceed.  We filled out all the paperwork and answered all her questions.  We will see her just prior to operative intervention if we get clearance.     Daniel F. Murphy, M.D.  

## 2015-09-09 ENCOUNTER — Other Ambulatory Visit: Payer: Self-pay | Admitting: Internal Medicine

## 2015-09-09 DIAGNOSIS — I10 Essential (primary) hypertension: Secondary | ICD-10-CM

## 2015-09-09 DIAGNOSIS — E119 Type 2 diabetes mellitus without complications: Secondary | ICD-10-CM

## 2015-09-09 NOTE — Telephone Encounter (Signed)
Pt requesting all meds to be filled @ [facility]. 

## 2015-09-10 MED ORDER — CALCIUM CARBONATE-VITAMIN D 600-400 MG-UNIT PO TABS: 2.0000 | ORAL_TABLET | Freq: Every day | ORAL | Status: DC

## 2015-09-10 MED ORDER — HYDROCHLOROTHIAZIDE 12.5 MG PO TABS: 12.5000 mg | ORAL_TABLET | Freq: Every day | ORAL | Status: DC

## 2015-09-10 MED ORDER — ALBUTEROL SULFATE HFA 108 (90 BASE) MCG/ACT IN AERS: 2.0000 | INHALATION_SPRAY | RESPIRATORY_TRACT | Status: DC | PRN

## 2015-09-10 MED ORDER — LISINOPRIL 20 MG PO TABS: 20.0000 mg | ORAL_TABLET | Freq: Every day | ORAL | Status: DC

## 2015-09-11 ENCOUNTER — Encounter (HOSPITAL_COMMUNITY): Payer: Self-pay

## 2015-09-11 ENCOUNTER — Encounter (HOSPITAL_COMMUNITY)
Admission: RE | Admit: 2015-09-11 | Discharge: 2015-09-11 | Disposition: A | Discharge status: Home / Self Care | Payer: PPO | Source: Ambulatory Visit | Attending: Orthopedic Surgery | Admitting: Orthopedic Surgery

## 2015-09-11 DIAGNOSIS — M1712 Unilateral primary osteoarthritis, left knee: Secondary | ICD-10-CM | POA: Insufficient documentation

## 2015-09-11 DIAGNOSIS — R9431 Abnormal electrocardiogram [ECG] [EKG]: Secondary | ICD-10-CM | POA: Insufficient documentation

## 2015-09-11 DIAGNOSIS — E785 Hyperlipidemia, unspecified: Secondary | ICD-10-CM | POA: Diagnosis not present

## 2015-09-11 DIAGNOSIS — I10 Essential (primary) hypertension: Secondary | ICD-10-CM | POA: Diagnosis not present

## 2015-09-11 DIAGNOSIS — Z01818 Encounter for other preprocedural examination: Secondary | ICD-10-CM | POA: Insufficient documentation

## 2015-09-11 DIAGNOSIS — Z01812 Encounter for preprocedural laboratory examination: Secondary | ICD-10-CM | POA: Diagnosis not present

## 2015-09-11 DIAGNOSIS — Z0183 Encounter for blood typing: Secondary | ICD-10-CM | POA: Diagnosis not present

## 2015-09-11 DIAGNOSIS — G20 Parkinson's disease: Secondary | ICD-10-CM | POA: Diagnosis not present

## 2015-09-11 HISTORY — DX: Unspecified osteoarthritis, unspecified site: M19.90

## 2015-09-11 HISTORY — DX: Reserved for inherently not codable concepts without codable children: IMO0001

## 2015-09-11 HISTORY — DX: Parkinson's disease: G20

## 2015-09-11 HISTORY — DX: Parkinson's disease without dyskinesia, without mention of fluctuations: G20.A1

## 2015-09-11 LAB — CBC WITH DIFFERENTIAL/PLATELET
BASOS PCT: 1 %
Basophils Absolute: 0 10*3/uL (ref 0.0–0.1)
EOS ABS: 0.3 10*3/uL (ref 0.0–0.7)
EOS PCT: 6 %
HCT: 41 % (ref 36.0–46.0)
HEMOGLOBIN: 13.5 g/dL (ref 12.0–15.0)
LYMPHS ABS: 1.9 10*3/uL (ref 0.7–4.0)
Lymphocytes Relative: 33 %
MCH: 31.2 pg (ref 26.0–34.0)
MCHC: 32.9 g/dL (ref 30.0–36.0)
MCV: 94.7 fL (ref 78.0–100.0)
MONOS PCT: 4 %
Monocytes Absolute: 0.3 10*3/uL (ref 0.1–1.0)
Neutro Abs: 3.2 10*3/uL (ref 1.7–7.7)
Neutrophils Relative %: 56 %
PLATELETS: 193 10*3/uL (ref 150–400)
RBC: 4.33 MIL/uL (ref 3.87–5.11)
RDW: 13.2 % (ref 11.5–15.5)
WBC: 5.7 10*3/uL (ref 4.0–10.5)

## 2015-09-11 LAB — COMPREHENSIVE METABOLIC PANEL
ALBUMIN: 3.6 g/dL (ref 3.5–5.0)
ALK PHOS: 96 U/L (ref 38–126)
ALT: 8 U/L — ABNORMAL LOW (ref 14–54)
ANION GAP: 8 (ref 5–15)
AST: 15 U/L (ref 15–41)
BUN: 11 mg/dL (ref 6–20)
CHLORIDE: 107 mmol/L (ref 101–111)
CO2: 25 mmol/L (ref 22–32)
Calcium: 9.2 mg/dL (ref 8.9–10.3)
Creatinine, Ser: 0.8 mg/dL (ref 0.44–1.00)
GFR calc non Af Amer: >60 mL/min (ref >60)
GLUCOSE: 89 mg/dL (ref 65–99)
POTASSIUM: 3.3 mmol/L — AB (ref 3.5–5.1)
SODIUM: 140 mmol/L (ref 135–145)
Total Bilirubin: 1.6 mg/dL — ABNORMAL HIGH (ref 0.3–1.2)
Total Protein: 7.7 g/dL (ref 6.5–8.1)

## 2015-09-11 LAB — SURGICAL PCR SCREEN
MRSA, PCR: NEGATIVE
STAPHYLOCOCCUS AUREUS: NEGATIVE

## 2015-09-11 LAB — TYPE AND SCREEN
ABO/RH(D): B POS
ANTIBODY SCREEN: NEGATIVE

## 2015-09-11 LAB — ABO/RH: ABO/RH(D): B POS

## 2015-09-11 LAB — PROTIME-INR
INR: 1.16 (ref 0.00–1.49)
Prothrombin Time: 14.9 seconds (ref 11.6–15.2)

## 2015-09-11 LAB — APTT: APTT: 28 s (ref 24–37)

## 2015-09-11 NOTE — Progress Notes (Signed)
PCP - Dr. Evelena PeatAlex Wilson Cardiologist - denies  EKG- 09/11/15- Epic CXR - denies  Stress Test/Echo/Cardiac cath - denies  Patient denies chest pain and shortness of breath at PAT appointment.

## 2015-09-11 NOTE — Pre-Procedure Instructions (Signed)
    Nicole Baxter  09/11/2015      [facility] [place] [address] [place] [id number] Phone: [phone] Fax: [phone]  [facility] - [place], [place] - [address] [place] [address] [place] [address] [place] [id number] Phone: [phone] Fax: [phone]    Your procedure is scheduled on Wednesday, October 26th, 2016.  [facility]:30 [facility].M.  Call this number if you have problems the morning of surgery:  [phone]   Remember:  Do not eat food or drink liquids after midnight.   Take these medicines the morning of surgery with A SIP OF WATER: Acetaminophen (Tylenol) if needed, Albuterol inhaler (please bring with you), Carbidopa-levodopa (Sinemet).  7 days prior to surgery, stop taking the following: Aspirin, NSAIDS, Ibuprofen, Advil, Motrin, Naproxen, Aleve, fish oil, all herbal medications, and all vitamins.    Do not wear jewelry, make-up or nail polish.  Do not wear lotions, powders, or perfumes.  You may wear deodorant.  Do not shave 48 hours prior to surgery.    Do not bring valuables to the hospital.  [facility] is not responsible for any belongings or valuables.  Contacts, dentures or bridgework may not be worn into surgery.  Leave your suitcase in the car.  After surgery it may be brought to your room.  For patients admitted to the hospital, discharge time will be determined by your treatment team.  Patients discharged the day of surgery will not be allowed to drive home.   Special instructions:  See attached.   Please read over the following fact sheets that you were given. Pain Booklet, Coughing and Deep Breathing, Blood Transfusion Information, MRSA Information and Surgical Site Infection Prevention

## 2015-09-13 LAB — URINE CULTURE

## 2015-09-14 NOTE — Progress Notes (Signed)
Anesthesia Chart Review:  Pt is 74 year old female scheduled for L total knee arthroplasty on 09/23/2015 with Dr. Jamison Neighbor. Murphy.   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 Never smoker. BMI 28.   Medications include: albuterol, carbidopa-levodopa, advair, hctz, lisinopril.   Preoperative labs reviewed.    EKG 09/11/2015: NSR. Low voltage QRS. Nonspecific T wave abnormality.  If no changes, I anticipate pt can proceed with surgery as scheduled.   Rica Mastngela Kabbe, FNP-BC [facility]An[facility]hesiology Phone: [phone] 09/14/2015 1:12 PM

## 2015-09-22 MED ORDER — LACTATED RINGERS IV SOLN
INTRAVENOUS | Status: DC
  Administered 2015-09-23 (×3): via INTRAVENOUS

## 2015-09-22 MED ORDER — CHLORHEXIDINE GLUCONATE 4 % EX LIQD: 60.0000 mL | Freq: Once | CUTANEOUS | Status: DC

## 2015-09-22 MED ORDER — SODIUM CHLORIDE 0.9 % IV SOLN
1000.0000 mg | INTRAVENOUS | Status: DC
  Filled 2015-09-22: qty 10

## 2015-09-22 MED ORDER — CEFAZOLIN SODIUM-DEXTROSE 2-3 GM-% IV SOLR
2.0000 g | INTRAVENOUS | Status: AC
  Administered 2015-09-23: 2 g via INTRAVENOUS

## 2015-09-23 ENCOUNTER — Encounter (HOSPITAL_COMMUNITY)
Admission: RE | Disposition: A | Discharge status: Skilled Nursing Facility | Payer: Self-pay | Source: Ambulatory Visit | Attending: Orthopedic Surgery

## 2015-09-23 ENCOUNTER — Inpatient Hospital Stay (HOSPITAL_COMMUNITY): Payer: PPO | Admitting: Anesthesiology

## 2015-09-23 ENCOUNTER — Inpatient Hospital Stay (HOSPITAL_COMMUNITY): Payer: PPO

## 2015-09-23 ENCOUNTER — Inpatient Hospital Stay (HOSPITAL_COMMUNITY): Payer: PPO | Admitting: Emergency Medicine

## 2015-09-23 ENCOUNTER — Encounter (HOSPITAL_COMMUNITY): Payer: Self-pay | Admitting: General Practice

## 2015-09-23 ENCOUNTER — Inpatient Hospital Stay (HOSPITAL_COMMUNITY)
Admission: RE | Admit: 2015-09-23 | Discharge: 2015-09-26 | DRG: 470 | Disposition: A | Discharge status: Skilled Nursing Facility | Payer: PPO | Source: Ambulatory Visit | Attending: Orthopedic Surgery | Admitting: Orthopedic Surgery

## 2015-09-23 DIAGNOSIS — I10 Essential (primary) hypertension: Secondary | ICD-10-CM | POA: Diagnosis present

## 2015-09-23 DIAGNOSIS — M1712 Unilateral primary osteoarthritis, left knee: Principal | ICD-10-CM | POA: Diagnosis present

## 2015-09-23 DIAGNOSIS — D62 Acute posthemorrhagic anemia: Secondary | ICD-10-CM | POA: Diagnosis not present

## 2015-09-23 DIAGNOSIS — M1710 Unilateral primary osteoarthritis, unspecified knee: Secondary | ICD-10-CM | POA: Diagnosis present

## 2015-09-23 DIAGNOSIS — E785 Hyperlipidemia, unspecified: Secondary | ICD-10-CM | POA: Diagnosis present

## 2015-09-23 DIAGNOSIS — K219 Gastro-esophageal reflux disease without esophagitis: Secondary | ICD-10-CM | POA: Diagnosis present

## 2015-09-23 DIAGNOSIS — E119 Type 2 diabetes mellitus without complications: Secondary | ICD-10-CM | POA: Diagnosis present

## 2015-09-23 DIAGNOSIS — G20C Parkinsonism, unspecified: Secondary | ICD-10-CM | POA: Diagnosis present

## 2015-09-23 DIAGNOSIS — Z6828 Body mass index (BMI) 28.0-28.9, adult: Secondary | ICD-10-CM | POA: Diagnosis not present

## 2015-09-23 DIAGNOSIS — M179 Osteoarthritis of knee, unspecified: Secondary | ICD-10-CM | POA: Diagnosis present

## 2015-09-23 DIAGNOSIS — E669 Obesity, unspecified: Secondary | ICD-10-CM | POA: Diagnosis present

## 2015-09-23 DIAGNOSIS — G20 Parkinson's disease: Secondary | ICD-10-CM | POA: Diagnosis present

## 2015-09-23 DIAGNOSIS — Z96659 Presence of unspecified artificial knee joint: Secondary | ICD-10-CM

## 2015-09-23 DIAGNOSIS — E559 Vitamin D deficiency, unspecified: Secondary | ICD-10-CM | POA: Diagnosis present

## 2015-09-23 DIAGNOSIS — M25562 Pain in left knee: Secondary | ICD-10-CM | POA: Diagnosis present

## 2015-09-23 HISTORY — PX: TOTAL KNEE ARTHROPLASTY: SHX125

## 2015-09-23 LAB — GLUCOSE, CAPILLARY: GLUCOSE-CAPILLARY: 135 mg/dL — AB (ref 65–99)

## 2015-09-23 SURGERY — ARTHROPLASTY, KNEE, TOTAL
Anesthesia: Spinal | Site: Knee | Laterality: Left

## 2015-09-23 MED ORDER — ACETAMINOPHEN 650 MG RE SUPP: 650.0000 mg | Freq: Four times a day (QID) | RECTAL | Status: DC | PRN

## 2015-09-23 MED ORDER — POLYETHYLENE GLYCOL 3350 17 G PO PACK: 17.0000 g | PACK | Freq: Every day | ORAL | Status: DC | PRN

## 2015-09-23 MED ORDER — ZOLPIDEM TARTRATE 5 MG PO TABS: 5.0000 mg | ORAL_TABLET | Freq: Every evening | ORAL | Status: DC | PRN

## 2015-09-23 MED ORDER — POTASSIUM CHLORIDE IN NACL 20-0.9 MEQ/L-% IV SOLN
INTRAVENOUS | Status: DC
  Administered 2015-09-23: 16:00:00 via INTRAVENOUS
  Administered 2015-09-24: 100 mL/h via INTRAVENOUS
  Filled 2015-09-23 (×2): qty 1000

## 2015-09-23 MED ORDER — DOCUSATE SODIUM 100 MG PO CAPS
100.0000 mg | ORAL_CAPSULE | Freq: Two times a day (BID) | ORAL | Status: DC
  Administered 2015-09-23 – 2015-09-26 (×6): 100 mg via ORAL
  Filled 2015-09-23 (×6): qty 1

## 2015-09-23 MED ORDER — CARBIDOPA-LEVODOPA 25-100 MG PO TABS
1.0000 | ORAL_TABLET | Freq: Three times a day (TID) | ORAL | Status: DC
  Administered 2015-09-23 – 2015-09-26 (×8): 1 via ORAL
  Filled 2015-09-23 (×7): qty 1

## 2015-09-23 MED ORDER — OXYCODONE HCL 5 MG PO TABS
5.0000 mg | ORAL_TABLET | ORAL | Status: DC | PRN
  Administered 2015-09-23: 5 mg via ORAL
  Administered 2015-09-23 (×2): 10 mg via ORAL
  Filled 2015-09-23: qty 1
  Filled 2015-09-23 (×2): qty 2

## 2015-09-23 MED ORDER — METOCLOPRAMIDE HCL 5 MG/ML IJ SOLN: 5.0000 mg | Freq: Three times a day (TID) | INTRAMUSCULAR | Status: DC | PRN

## 2015-09-23 MED ORDER — ALBUTEROL SULFATE (2.5 MG/3ML) 0.083% IN NEBU: 3.0000 mL | INHALATION_SOLUTION | RESPIRATORY_TRACT | Status: DC | PRN

## 2015-09-23 MED ORDER — MIDAZOLAM HCL 5 MG/5ML IJ SOLN
INTRAMUSCULAR | Status: DC | PRN
  Administered 2015-09-23 (×2): 1 mg via INTRAVENOUS

## 2015-09-23 MED ORDER — ONDANSETRON HCL 4 MG/2ML IJ SOLN: 4.0000 mg | Freq: Four times a day (QID) | INTRAMUSCULAR | Status: DC | PRN

## 2015-09-23 MED ORDER — ALUM & MAG HYDROXIDE-SIMETH 200-200-20 MG/5ML PO SUSP: 30.0000 mL | ORAL | Status: DC | PRN

## 2015-09-23 MED ORDER — PROPOFOL 500 MG/50ML IV EMUL
INTRAVENOUS | Status: DC | PRN
  Administered 2015-09-23: 60 ug/kg/min via INTRAVENOUS

## 2015-09-23 MED ORDER — FENTANYL CITRATE (PF) 100 MCG/2ML IJ SOLN
INTRAMUSCULAR | Status: DC | PRN
  Administered 2015-09-23: 50 ug via INTRAVENOUS

## 2015-09-23 MED ORDER — BUPIVACAINE HCL 0.5 % IJ SOLN
INTRAMUSCULAR | Status: DC | PRN
  Administered 2015-09-23: 10 mL

## 2015-09-23 MED ORDER — CEFAZOLIN SODIUM-DEXTROSE 2-3 GM-% IV SOLR
INTRAVENOUS | Status: AC
  Filled 2015-09-23: qty 50

## 2015-09-23 MED ORDER — OXYCODONE-ACETAMINOPHEN 5-325 MG PO TABS: 1.0000 | ORAL_TABLET | ORAL | Status: DC | PRN

## 2015-09-23 MED ORDER — METHOCARBAMOL 1000 MG/10ML IJ SOLN
500.0000 mg | Freq: Four times a day (QID) | INTRAVENOUS | Status: DC | PRN
  Filled 2015-09-23: qty 5

## 2015-09-23 MED ORDER — HYDROMORPHONE HCL 1 MG/ML IJ SOLN
0.5000 mg | INTRAMUSCULAR | Status: DC | PRN
  Administered 2015-09-23 – 2015-09-24 (×3): 1 mg via INTRAVENOUS
  Filled 2015-09-23 (×3): qty 1

## 2015-09-23 MED ORDER — BUPIVACAINE HCL (PF) 0.5 % IJ SOLN
INTRAMUSCULAR | Status: AC
  Filled 2015-09-23: qty 10

## 2015-09-23 MED ORDER — FENTANYL CITRATE (PF) 250 MCG/5ML IJ SOLN
INTRAMUSCULAR | Status: AC
  Filled 2015-09-23: qty 5

## 2015-09-23 MED ORDER — MAGNESIUM CITRATE PO SOLN: 1.0000 | Freq: Once | ORAL | Status: DC | PRN

## 2015-09-23 MED ORDER — METOCLOPRAMIDE HCL 5 MG PO TABS: 5.0000 mg | ORAL_TABLET | Freq: Three times a day (TID) | ORAL | Status: DC | PRN

## 2015-09-23 MED ORDER — BISACODYL 5 MG PO TBEC
5.0000 mg | DELAYED_RELEASE_TABLET | Freq: Every day | ORAL | Status: DC | PRN
Start: 2015-09-23 — End: 2016-08-16

## 2015-09-23 MED ORDER — ONDANSETRON HCL 4 MG/2ML IJ SOLN: 4.0000 mg | Freq: Once | INTRAMUSCULAR | Status: DC | PRN

## 2015-09-23 MED ORDER — HYDROCHLOROTHIAZIDE 25 MG PO TABS
12.5000 mg | ORAL_TABLET | Freq: Every day | ORAL | Status: DC
  Administered 2015-09-23 – 2015-09-26 (×4): 12.5 mg via ORAL
  Filled 2015-09-23 (×4): qty 1

## 2015-09-23 MED ORDER — APIXABAN 2.5 MG PO TABS
2.5000 mg | ORAL_TABLET | Freq: Two times a day (BID) | ORAL | Status: DC
  Administered 2015-09-24 – 2015-09-26 (×4): 2.5 mg via ORAL
  Filled 2015-09-23 (×4): qty 1

## 2015-09-23 MED ORDER — INSULIN ASPART 100 UNIT/ML ~~LOC~~ SOLN
0.0000 [IU] | Freq: Three times a day (TID) | SUBCUTANEOUS | Status: DC
  Administered 2015-09-24 (×2): 1 [IU] via SUBCUTANEOUS

## 2015-09-23 MED ORDER — CELECOXIB 200 MG PO CAPS
200.0000 mg | ORAL_CAPSULE | Freq: Two times a day (BID) | ORAL | Status: DC
Start: 2015-09-23 — End: 2015-09-26
  Administered 2015-09-23 – 2015-09-26 (×6): 200 mg via ORAL
  Filled 2015-09-23 (×6): qty 1

## 2015-09-23 MED ORDER — METHOCARBAMOL 500 MG PO TABS
500.0000 mg | ORAL_TABLET | Freq: Four times a day (QID) | ORAL | Status: DC | PRN
  Administered 2015-09-23 (×2): 500 mg via ORAL
  Filled 2015-09-23 (×2): qty 1

## 2015-09-23 MED ORDER — MENTHOL 3 MG MT LOZG: 1.0000 | LOZENGE | OROMUCOSAL | Status: DC | PRN

## 2015-09-23 MED ORDER — ACETAMINOPHEN 325 MG PO TABS
650.0000 mg | ORAL_TABLET | Freq: Four times a day (QID) | ORAL | Status: DC | PRN
  Administered 2015-09-25: 650 mg via ORAL

## 2015-09-23 MED ORDER — METHOCARBAMOL 1000 MG/10ML IJ SOLN
500.0000 mg | INTRAVENOUS | Status: DC
  Filled 2015-09-23: qty 5

## 2015-09-23 MED ORDER — SODIUM CHLORIDE 0.9 % IR SOLN
Status: DC | PRN
  Administered 2015-09-23 (×2): 1000 mL

## 2015-09-23 MED ORDER — BISACODYL 10 MG RE SUPP: 10.0000 mg | Freq: Every day | RECTAL | Status: DC | PRN

## 2015-09-23 MED ORDER — APIXABAN 2.5 MG PO TABS: ORAL_TABLET | ORAL | Status: DC

## 2015-09-23 MED ORDER — MEPERIDINE HCL 25 MG/ML IJ SOLN: 6.2500 mg | INTRAMUSCULAR | Status: DC | PRN

## 2015-09-23 MED ORDER — PROPOFOL 10 MG/ML IV BOLUS
INTRAVENOUS | Status: AC
  Filled 2015-09-23: qty 20

## 2015-09-23 MED ORDER — TRANEXAMIC ACID 1000 MG/10ML IV SOLN
1000.0000 mg | INTRAVENOUS | Status: AC
  Administered 2015-09-23: 1000 mg via INTRAVENOUS
  Filled 2015-09-23: qty 10

## 2015-09-23 MED ORDER — DEXAMETHASONE SODIUM PHOSPHATE 10 MG/ML IJ SOLN
10.0000 mg | Freq: Once | INTRAMUSCULAR | Status: AC
  Administered 2015-09-24: 10 mg via INTRAVENOUS
  Filled 2015-09-23: qty 1

## 2015-09-23 MED ORDER — LISINOPRIL 20 MG PO TABS
20.0000 mg | ORAL_TABLET | Freq: Every day | ORAL | Status: DC
  Administered 2015-09-23 – 2015-09-26 (×4): 20 mg via ORAL
  Filled 2015-09-23 (×4): qty 1

## 2015-09-23 MED ORDER — HYDROMORPHONE HCL 1 MG/ML IJ SOLN: 0.2500 mg | INTRAMUSCULAR | Status: DC | PRN

## 2015-09-23 MED ORDER — DIPHENHYDRAMINE HCL 12.5 MG/5ML PO ELIX: 12.5000 mg | ORAL_SOLUTION | ORAL | Status: DC | PRN

## 2015-09-23 MED ORDER — ONDANSETRON HCL 4 MG PO TABS: 4.0000 mg | ORAL_TABLET | Freq: Four times a day (QID) | ORAL | Status: DC | PRN

## 2015-09-23 MED ORDER — ONDANSETRON HCL 4 MG PO TABS: 4.0000 mg | ORAL_TABLET | Freq: Three times a day (TID) | ORAL | Status: DC | PRN

## 2015-09-23 MED ORDER — PHENOL 1.4 % MT LIQD: 1.0000 | OROMUCOSAL | Status: DC | PRN

## 2015-09-23 MED ORDER — MOMETASONE FURO-FORMOTEROL FUM 200-5 MCG/ACT IN AERO
2.0000 | INHALATION_SPRAY | Freq: Two times a day (BID) | RESPIRATORY_TRACT | Status: DC
  Administered 2015-09-23 – 2015-09-26 (×5): 2 via RESPIRATORY_TRACT
  Filled 2015-09-23: qty 8.8

## 2015-09-23 MED ORDER — PROPOFOL 10 MG/ML IV BOLUS
INTRAVENOUS | Status: DC | PRN
Start: 2015-09-23 — End: 2015-09-23
  Administered 2015-09-23: 50 mg via INTRAVENOUS

## 2015-09-23 MED ORDER — MIDAZOLAM HCL 2 MG/2ML IJ SOLN
INTRAMUSCULAR | Status: AC
  Filled 2015-09-23: qty 4

## 2015-09-23 MED ORDER — 0.9 % SODIUM CHLORIDE (POUR BTL) OPTIME
TOPICAL | Status: DC | PRN
  Administered 2015-09-23: 1000 mL

## 2015-09-23 MED ORDER — BUPIVACAINE LIPOSOME 1.3 % IJ SUSP
20.0000 mL | Freq: Once | INTRAMUSCULAR | Status: AC
  Administered 2015-09-23: 20 mL
  Filled 2015-09-23: qty 20

## 2015-09-23 SURGICAL SUPPLY — 66 items
BANDAGE ELASTIC 4 VELCRO ST LF (GAUZE/BANDAGES/DRESSINGS) ×3 IMPLANT
BANDAGE ELASTIC 6 VELCRO ST LF (GAUZE/BANDAGES/DRESSINGS) ×3 IMPLANT
BANDAGE ESMARK 6X9 LF (GAUZE/BANDAGES/DRESSINGS) ×1 IMPLANT
BENZOIN TINCTURE PRP APPL 2/3 (GAUZE/BANDAGES/DRESSINGS) ×3 IMPLANT
BLADE SAG 18X100X1.27 (BLADE) ×6 IMPLANT
BNDG ESMARK 6X9 LF (GAUZE/BANDAGES/DRESSINGS) ×3
BOWL SMART MIX CTS (DISPOSABLE) ×3 IMPLANT
CAPT KNEE TOTAL 3 ×3 IMPLANT
CEMENT BONE SIMPLEX SPEEDSET (Cement) ×6 IMPLANT
CLOSURE WOUND 1/2 X4 (GAUZE/BANDAGES/DRESSINGS) ×1
COVER SURGICAL LIGHT HANDLE (MISCELLANEOUS) ×3 IMPLANT
CUFF TOURNIQUET SINGLE 34IN LL (TOURNIQUET CUFF) ×3 IMPLANT
DRAPE EXTREMITY T 121X128X90 (DRAPE) ×3 IMPLANT
DRAPE IMP U-DRAPE 54X76 (DRAPES) ×3 IMPLANT
DRAPE PROXIMA HALF (DRAPES) ×3 IMPLANT
DRAPE U-SHAPE 47X51 STRL (DRAPES) ×3 IMPLANT
DRSG PAD ABDOMINAL 8X10 ST (GAUZE/BANDAGES/DRESSINGS) ×3 IMPLANT
DURAPREP 26ML APPLICATOR (WOUND CARE) ×6 IMPLANT
ELECT CAUTERY BLADE 6.4 (BLADE) ×3 IMPLANT
ELECT REM PT RETURN 9FT ADLT (ELECTROSURGICAL) ×3
ELECTRODE REM PT RTRN 9FT ADLT (ELECTROSURGICAL) ×1 IMPLANT
EVACUATOR 1/8 PVC DRAIN (DRAIN) ×3 IMPLANT
FACESHIELD WRAPAROUND (MASK) ×6 IMPLANT
GAUZE SPONGE 4X4 12PLY STRL (GAUZE/BANDAGES/DRESSINGS) ×3 IMPLANT
GLOVE BIOGEL PI IND STRL 6.5 (GLOVE) ×2 IMPLANT
GLOVE BIOGEL PI IND STRL 7.0 (GLOVE) ×1 IMPLANT
GLOVE BIOGEL PI INDICATOR 6.5 (GLOVE) ×4
GLOVE BIOGEL PI INDICATOR 7.0 (GLOVE) ×2
GLOVE ORTHO TXT STRL SZ7.5 (GLOVE) ×3 IMPLANT
GLOVE SS N UNI LF 8.0 STRL (GLOVE) ×9 IMPLANT
GLOVE SURG ORTHO 7.0 STRL STRW (GLOVE) ×3 IMPLANT
GLOVE SURG SS PI 6.5 STRL IVOR (GLOVE) ×6 IMPLANT
GOWN STRL REUS W/ TWL LRG LVL3 (GOWN DISPOSABLE) ×2 IMPLANT
GOWN STRL REUS W/ TWL XL LVL3 (GOWN DISPOSABLE) ×2 IMPLANT
GOWN STRL REUS W/TWL LRG LVL3 (GOWN DISPOSABLE) ×4
GOWN STRL REUS W/TWL XL LVL3 (GOWN DISPOSABLE) ×4
HANDPIECE INTERPULSE COAX TIP (DISPOSABLE) ×2
IMMOBILIZER KNEE 22 UNIV (SOFTGOODS) ×3 IMPLANT
IMMOBILIZER KNEE 24 THIGH 36 (MISCELLANEOUS) IMPLANT
IMMOBILIZER KNEE 24 UNIV (MISCELLANEOUS)
KIT BASIN OR (CUSTOM PROCEDURE TRAY) ×3 IMPLANT
KIT ROOM TURNOVER OR (KITS) ×3 IMPLANT
MANIFOLD NEPTUNE II (INSTRUMENTS) ×3 IMPLANT
NEEDLE 18GX1X1/2 (RX/OR ONLY) (NEEDLE) ×3 IMPLANT
NEEDLE HYPO 25GX1X1/2 BEV (NEEDLE) ×3 IMPLANT
NS IRRIG 1000ML POUR BTL (IV SOLUTION) ×3 IMPLANT
PACK TOTAL JOINT (CUSTOM PROCEDURE TRAY) ×3 IMPLANT
PACK UNIVERSAL I (CUSTOM PROCEDURE TRAY) ×3 IMPLANT
PAD ARMBOARD 7.5X6 YLW CONV (MISCELLANEOUS) ×6 IMPLANT
PAD CAST 4YDX4 CTTN HI CHSV (CAST SUPPLIES) ×1 IMPLANT
PADDING CAST COTTON 4X4 STRL (CAST SUPPLIES) ×2
SET HNDPC FAN SPRY TIP SCT (DISPOSABLE) ×1 IMPLANT
STRIP CLOSURE SKIN 1/2X4 (GAUZE/BANDAGES/DRESSINGS) ×2 IMPLANT
SUCTION FRAZIER TIP 10 FR DISP (SUCTIONS) ×3 IMPLANT
SUT MNCRL AB 4-0 PS2 18 (SUTURE) ×3 IMPLANT
SUT VIC AB 0 CT1 27 (SUTURE) ×4
SUT VIC AB 0 CT1 27XBRD ANBCTR (SUTURE) ×2 IMPLANT
SUT VIC AB 1 CTX 36 (SUTURE) ×4
SUT VIC AB 1 CTX36XBRD ANBCTR (SUTURE) ×2 IMPLANT
SUT VIC AB 2-0 CT1 27 (SUTURE) ×6
SUT VIC AB 2-0 CT1 TAPERPNT 27 (SUTURE) ×3 IMPLANT
SYR 50ML LL SCALE MARK (SYRINGE) ×3 IMPLANT
SYR CONTROL 10ML LL (SYRINGE) ×3 IMPLANT
TOWEL OR 17X24 6PK STRL BLUE (TOWEL DISPOSABLE) ×3 IMPLANT
TOWEL OR 17X26 10 PK STRL BLUE (TOWEL DISPOSABLE) ×3 IMPLANT
TRAY CATH 16FR W/PLASTIC CATH (SET/KITS/TRAYS/PACK) ×3 IMPLANT

## 2015-09-23 NOTE — H&P (View-Only) (Signed)
This is a 74 year-old female who is a new patient to the office.  Presents accompanied by her husband.  Unrelenting, worsening, chronic pain in both knees.  More than two years.  This has progressed to a point of marked functional impact.  Using a cane all the time.  Rest pain and night pain.  Increasing deformity.  The left is a little worse than the right.  Cortisone injections really did not help, other than just a couple of days.  She really would like to proceed with definitive treatment.  She has been told she probably needs a knee replacement.  Comes in for evaluation and to discuss this. Remaining history reviewed.  This is significant for hypertension, but also a relatively new tremor that still tends to come and go.  This looks like a picture of Parkinson's, but she has not gotten that definite diagnosis, although she is being seen by a neurologist.  This definitely has an effect on her balance which makes the situation with her knees even worse because of their pain and increasing deformity.    EXAMINATION: General exam is outlined and included in the chart.  Specifically, 74 year-old female.  Height: 5?1.  Weight: 147 pounds.  She has very typical pill rolling mannerism, both hands, consistent with Parkinson's.  Overall her strength is not bad.  Negative log roll of both hips.  Negative straight leg raise.  Other than the tremor she is neurovascularly intact.  Both knees have about 5-7 degrees of varus, somewhat correctable.  Left knee motion 5-90, right 5-95.  Marked tibiofemoral and patellofemoral crepitus.    X-RAYS: Four view standing x-rays with sizing balls reveal end stage changes, bone on bone medially with marked extensive tricompartmental changes.  Still reasonable bone stock.    DISPOSITION:  The only true viable option she has is bilateral total knee replacements.  I certainly wouldn't do these at the same time, given this individual.  I would feel better to get clearance from  neurology from a point of view of what is going on with her Parkinson's.  Sequential bilateral knee replacements discussed, doing the left side first.  Procedure, risks, benefits and complications reviewed.  Again, I want to get primary care, as well as neurology, clearance before we proceed.  We filled out all the paperwork and answered all her questions.  We will see her just prior to operative intervention if we get clearance.     Loreta Aveaniel F. Murphy, M.D.

## 2015-09-23 NOTE — Transfer of Care (Signed)
Immediate Anesthesia Transfer of Care Note  Patient: Nicole Baxter  Procedure(s) Performed: Procedure(s): LEFT TOTAL KNEE ARTHROPLASTY (Left)  Patient Location: PACU  Anesthesia Type:Spinal  Level of Consciousness: awake, patient cooperative and responds to stimulation  Airway & Oxygen Therapy: Patient Spontanous Breathing and Patient connected to nasal cannula oxygen  Post-op Assessment: Report given to RN, Post -op Vital signs reviewed and stable and Patient moving all extremities X 4  Post vital signs: Reviewed and stable  Last Vitals:  Filed Vitals:   09/23/15 0700  BP:   Pulse:   Temp: 36.8 C  Resp:     Complications: No apparent anesthesia complications

## 2015-09-23 NOTE — Anesthesia Preprocedure Evaluation (Signed)
Anesthesia Evaluation  Patient identified by MRN, date of birth, ID band Patient awake    Reviewed: Allergy & Precautions, NPO status , Patient's Chart, lab work & pertinent test results  Airway Mallampati: I  TM Distance: >3 FB Neck ROM: Full    Dental   Pulmonary    Pulmonary exam normal        Cardiovascular hypertension, Pt. on medications Normal cardiovascular exam     Neuro/Psych    GI/Hepatic GERD  Controlled,  Endo/Other    Renal/GU      Musculoskeletal   Abdominal   Peds  Hematology   Anesthesia Other Findings   Reproductive/Obstetrics                             Anesthesia Physical Anesthesia Plan  ASA: II  Anesthesia Plan: Spinal   Post-op Pain Management:    Induction: Intravenous  Airway Management Planned: Natural Airway  Additional Equipment:   Intra-op Plan:   Post-operative Plan:   Informed Consent: I have reviewed the patients History and Physical, chart, labs and discussed the procedure including the risks, benefits and alternatives for the proposed anesthesia with the patient or authorized representative who has indicated his/her understanding and acceptance.     Plan Discussed with: CRNA and Surgeon  Anesthesia Plan Comments:         Anesthesia Quick Evaluation

## 2015-09-23 NOTE — Progress Notes (Signed)
Per previous note from Dr. Eulah PontMurphy, patient was supposed to have neurology clearance.  Patient states that she had an appointment scheduled for Monday, 10/24 but canceled appointment due to transportation.  Left message for Dr. Eulah PontMurphy.

## 2015-09-23 NOTE — Progress Notes (Signed)
Report given to steve shaw rn as caregiver 

## 2015-09-23 NOTE — Interval H&P Note (Signed)
History and Physical Interval Note:  09/23/2015 8:20 AM  Nicole Baxter  has presented today for surgery, with the diagnosis of OA LEFT KNEE  The various methods of treatment have been discussed with the patient and family. After consideration of risks, benefits and other options for treatment, the patient has consented to  Procedure(s): LEFT TOTAL KNEE ARTHROPLASTY (Left) as a surgical intervention .  The patient's history has been reviewed, patient examined, no change in status, stable for surgery.  I have reviewed the patient's chart and labs.  Questions were answered to the patient's satisfaction.     [name]

## 2015-09-23 NOTE — Anesthesia Postprocedure Evaluation (Signed)
Anesthesia Post Note  Patient: Nicole Baxter  Procedure(s) Performed: Procedure(s) (LRB): LEFT TOTAL KNEE ARTHROPLASTY (Left)  Anesthesia type: spinal  Patient location: PACU  Post pain: Pain level controlled  Post assessment: Patient's Cardiovascular Status Stable  Last Vitals:  Filed Vitals:   09/23/15 1437  BP: 121/79  Pulse: 88  Temp: 36.6 C  Resp: 16    Post vital signs: Reviewed and stable  Level of consciousness: awake  Complications: No apparent anesthesia complications

## 2015-09-23 NOTE — Anesthesia Procedure Notes (Addendum)
Spinal Patient location during procedure: OR Start time: 09/23/2015 8:40 AM End time: 09/23/2015 8:45 AM Staffing Anesthesiologist: Arta BruceSSEY, KEVIN Performed by: anesthesiologist  Preanesthetic Checklist Completed: patient identified, site marked, surgical consent, pre-op evaluation, timeout performed, IV checked, risks and benefits discussed and monitors and equipment checked Spinal Block Patient position: sitting Prep: Betadine Patient monitoring: heart rate, cardiac monitor, continuous pulse ox and blood pressure Approach: right paramedian Location: L3-4 Injection technique: single-shot Needle Needle type: Pencan  Needle gauge: 24 G Needle length: 9 cm Needle insertion depth: 7 cm  Procedure Name: MAC Date/Time: 09/23/2015 8:46 AM Performed by: Patrcia DollyMOSES, Tennelle Taflinger Pre-anesthesia Checklist: Patient identified, Emergency Drugs available, Suction available, Timeout performed and Patient being monitored Patient Re-evaluated:Patient Re-evaluated prior to inductionOxygen Delivery Method: Non-rebreather mask

## 2015-09-23 NOTE — Evaluation (Addendum)
Physical Therapy Evaluation Patient Details Name: Nicole Baxter MRN: [id number] DOB: 06/02/1941 Today's Date: 09/23/2015   History of Present Illness  74 y.o. female s/p Lt TKA PMH: parkinsons, Obesity, hypertension, lung disease  Clinical Impression  Pt is s/p TKA resulting in the deficits listed below (see PT Problem List). Pt will benefit from skilled PT to increase their independence and safety with mobility to allow discharge to SNF. Initial mobility requiring +2 max assist with transfers and gait. Patient reporting need to get out of bed prior to session due to having to urinate and increasing back pain. Patient confirms ability to use call light and awareness to call nursing if needing to return to bed or requesting pain medication. Will progress mobility as tolerated.       Follow Up Recommendations SNF;Supervision/Assistance - 24 hour    Equipment Recommendations  None recommended by PT;Other (comment) (patient reports having rw at home)    Recommendations for Other Services       Precautions / Restrictions Precautions Precautions: Fall;Knee Precaution Booklet Issued: Yes (comment) Precaution Comments: HEP provided Restrictions Weight Bearing Restrictions: Yes LLE Weight Bearing: Weight bearing as tolerated      Mobility  Bed Mobility Overal bed mobility: Needs Assistance Bed Mobility: Supine to Sit     Supine to sit: Max assist;HOB elevated        Transfers Overall transfer level: Needs assistance Equipment used: Rolling walker (2 wheeled) Transfers: Sit to/from Stand Sit to Stand: Max assist;+2 physical assistance Stand pivot transfers: +2 physical assistance;Max assist       General transfer comment: encouragement for patient to assist with transfers. Manual assistance needed to move UEs from bed to walker. Heavy cues and physical assist needed throughout transfers. Patient transferring [facility] to chair.   Ambulation/Gait Ambulation/Gait  assistance: +2 physical assistance;Max assist Ambulation Distance (Feet): 3 Feet (BSC to chair) Assistive device: Rolling walker (2 wheeled) Gait Pattern/deviations: Shuffle Gait velocity: very decreased   General Gait Details: manual assist needed to initiate gait from standing, verbal and physical assist for posture.   Stairs            Wheelchair Mobility    Modified Rankin (Stroke Patients Only)       Balance Overall balance assessment: Needs assistance Sitting-balance support: Bilateral upper extremity supported Sitting balance-Leahy Scale: Poor     Standing balance support: Bilateral upper extremity supported Standing balance-Leahy Scale: Poor Standing balance comment: physical assist and rw needed.                              Pertinent Vitals/Pain Pain Assessment: Faces Faces Pain Scale: Hurts even more Pain Location: Low back and Lt knee (reporting back worse pain than knee) Pain Descriptors / Indicators: Guarding;Grimacing Pain Intervention(s): Limited activity within patient's tolerance;Monitored during session    Home Living Family/patient expects to be discharged to:: Skilled nursing facility Living Arrangements: Spouse/significant other Available Help at Discharge: Available 24 hours/day;Family Type of Home: Mobile home Home Access: Ramped entrance     Home Layout: One level Home Equipment: Walker - 2 wheels      Prior Function Level of Independence: Needs assistance   Gait / Transfers Assistance Needed: using rw with mobility  ADL's / Homemaking Assistance Needed: reports husband assisting with dressing and bathing needs        Hand Dominance        Extremity/Trunk Assessment  Lower Extremity Assessment: Generalized weakness         Communication   Communication: No difficulties  Cognition Arousal/Alertness: Awake/alert Behavior During Therapy: Flat affect Overall Cognitive Status: Within  Functional Limits for tasks assessed                      General Comments      Exercises        Assessment/Plan    PT Assessment Patient needs continued PT services  PT Diagnosis Difficulty walking;Generalized weakness;Acute pain   PT Problem List Decreased strength;Decreased range of motion;Decreased activity tolerance;Decreased balance;Decreased mobility  PT Treatment Interventions DME instruction;Gait training;Stair training;Functional mobility training;Therapeutic activities;Therapeutic exercise;Balance training;Patient/family education   PT Goals (Current goals can be found in the Care Plan section) Acute Rehab PT Goals Patient Stated Goal: move better PT Goal Formulation: With patient Time For Goal Achievement: 10/07/15 Potential to Achieve Goals: Good    Frequency 7X/week   Barriers to discharge        Co-evaluation               End of Session Equipment Utilized During Treatment: Gait belt Activity Tolerance: Patient limited by fatigue Patient left: in chair;with call bell/phone within reach;with family/visitor present Nurse Communication: Mobility status;Precautions;Weight bearing status         Time: 1610-9604 PT Time Calculation (min) (ACUTE ONLY): 24 min   Charges:     PT Treatments $Therapeutic Activity: 8-22 mins   PT G Codes:        Christiane Ha, PT, CSCS Pager [phone] Office [phone]  09/23/2015, 4:25 PM

## 2015-09-23 NOTE — Progress Notes (Signed)
Orthopedic Tech Progress Note Patient Details:  Randa NgoRuby L Milley 12/02/1940 [id number] Viewed order from doctor's order list CPM Left Knee CPM Left Knee: On Left Knee Flexion (Degrees): 90 Left Knee Extension (Degrees): 0 Additional Comments: trapzwe bar patient helper   Nikki DomCrawford, Layten Aiken 09/23/2015, 11:12 AM

## 2015-09-23 NOTE — Discharge Summary (Addendum)
Patient ID: MCKINZEY ENTWISTLE MRN: [id number] DOB/AGE: 1940-12-31 74 y.o.  Addendum: no interval changes since yesterday. Pt remains stable. Ready for dc. Please see dc summary below  Jari Pigg, PA-C [facility] [phone] (P)   Admit date: 09/23/2015 Discharge date: 09/26/2015  Admission Diagnoses:  Principal Problem:   DJD (degenerative joint disease) of knee Active Problems:   Diabetes mellitus type 2, diet-controlled ([place])   Hyperlipidemia   Parkinsonism ([place])   Essential hypertension   Vitamin D deficiency   Discharge Diagnoses:  Same  Past Medical History  Diagnosis Date  . Obesity   . Hyperglycemia     isolated FBS 122, 95, 104 (Random 138)  . 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 Hyperlipidemia     on pravastatin  . Hypertension     controlled on 2 agents  . Parkinson's disease (tremor, stiffness, slow motion, unstable posture) ([place])   . Tremor   . GERD (gastroesophageal reflux disease)     pt. states she no longer has GERD (09/11/15)  . Arthritis   . Shortness of breath dyspnea     pt. states that she no longer has SOB )09/11/15    Surgeries: Procedure(s): LEFT TOTAL KNEE ARTHROPLASTY on 09/23/2015   Consultants:    Discharged Condition: Improved  Hospital Course: AZYIAH BO is an 74 y.o. female who was admitted 09/23/2015 for operative treatment of primary localized osteoarthritis left knee. Patient has severe unremitting pain that affects sleep, daily activities, and work/hobbies. After pre-op clearance the patient was taken to the operating room on 09/23/2015 and underwent  Procedure(s): LEFT TOTAL KNEE ARTHROPLASTY.  Patient with a pre-op Hb of 13.5 developed ABLA on pod #1 with a Hb of 11.3 and 9.9 on pod#2.  She is currently stable but we will continue to follow.  Patient was given perioperative antibiotics:      Anti-infectives    Start     Dose/Rate Route Frequency Ordered Stop   09/23/15 0800  ceFAZolin  (ANCEF) IVPB 2 g/50 mL premix     2 g 100 mL/hr over 30 Minutes Intravenous To ShortStay [facility] 09/22/15 1309 09/23/15 0850   09/23/15 0608  ceFAZolin (ANCEF) 2-3 GM-% IVPB SOLR    Comments:  Sharmaine Base   : cabinet override      09/23/15 2482 09/23/15 1814       Patient was given sequential compression devices, early ambulation, and chemoprophylaxis to prevent DVT.  Patient benefited maximally from hospital stay and there were no complications.    Recent vital signs:  Patient Vitals for the past 24 hrs:  BP Temp Temp src Pulse Resp SpO2  09/26/15 0845 - - - - - (!) 86 %  09/26/15 0316 125/65 mmHg 99.3 F (37.4 C) Oral 86 17 100 %  09/25/15 1942 (!) 130/96 mmHg 99.1 F (37.3 C) Oral (!) 102 18 100 %  09/25/15 1452 133/80 mmHg 98.8 F (37.1 C) Oral 89 18 98 %     Recent laboratory studies:   Recent Labs  09/25/15 0419 09/26/15 0542  WBC 9.1 7.7  HGB 9.9* 10.5*  HCT 30.4* 31.6*  PLT 176 173  NA 137 137  K 3.9 3.6  CL 103 99*  CO2 29 26  BUN 14 14  CREATININE 1.00 0.84  GLUCOSE 113* 95  CALCIUM 8.9 8.7*   Results for MARIEN, MANSHIP (MRN [id number]) as of 09/26/2015 10:52  Ref. Range 09/26/2015 05:42  Sodium Latest Ref  Range: 135-145 mmol/L 137  Potassium Latest Ref Range: 3.5-5.1 mmol/L 3.6  Chloride Latest Ref Range: 101-111 mmol/L 99 (L)  CO2 Latest Ref Range: 22-32 mmol/L 26  BUN Latest Ref Range: 6-20 mg/dL 14  Creatinine Latest Ref Range: 0.44-1.00 mg/dL 0.84  Calcium Latest Ref Range: 8.9-10.3 mg/dL 8.7 (L)  EGFR (Non-African Amer.) Latest Ref Range: >60 mL/min >60  EGFR (African American) Latest Ref Range: >60 mL/min >60  Glucose Latest Ref Range: 65-99 mg/dL 95  Anion gap Latest Ref Range: 5-15  12  WBC Latest Ref Range: 4.0-10.5 K/uL 7.7  RBC Latest Ref Range: 3.87-5.11 MIL/uL 3.38 (L)  Hemoglobin Latest Ref Range: 12.0-15.0 g/dL 10.5 (L)  HCT Latest Ref Range: 36.0-46.0 % 31.6 (L)  MCV Latest Ref Range: 78.0-100.0 fL 93.5  MCH Latest Ref  Range: 26.0-34.0 pg 31.1  MCHC Latest Ref Range: 30.0-36.0 g/dL 33.2  RDW Latest Ref Range: 11.5-15.5 % 13.1  Platelets Latest Ref Range: 150-400 K/uL 173  Results for ELYNN, PATTESON (MRN [id number]) as of 09/26/2015 10:52  Ref. Range 09/25/2015 09:10  Appearance Latest Ref Range: CLEAR  CLEAR  Bilirubin Urine Latest Ref Range: NEGATIVE  NEGATIVE  Color, Urine Latest Ref Range: YELLOW  YELLOW  Glucose Latest Ref Range: NEGATIVE mg/dL NEGATIVE  Hgb urine dipstick Latest Ref Range: NEGATIVE  NEGATIVE  Ketones, ur Latest Ref Range: NEGATIVE mg/dL NEGATIVE  Leukocytes, UA Latest Ref Range: NEGATIVE  NEGATIVE  Nitrite Latest Ref Range: NEGATIVE  NEGATIVE  pH Latest Ref Range: 5.0-8.0  5.5  Protein Latest Ref Range: NEGATIVE mg/dL NEGATIVE  Specific Gravity, Urine Latest Ref Range: 1.005-1.030  1.012  Urobilinogen, UA Latest Ref Range: 0.0-1.0 mg/dL 0.2    Discharge Medications:     Medication List    STOP taking these medications        acetaminophen 500 MG tablet  Commonly known as:  TYLENOL     diclofenac sodium 1 % Gel  Commonly known as:  VOLTAREN     Fluticasone-Salmeterol 500-50 MCG/DOSE Aepb  Commonly known as:  ADVAIR      TAKE these medications        albuterol 108 (90 BASE) MCG/ACT inhaler  Commonly known as:  PROAIR HFA  Inhale 2 puffs into the lungs every 4 (four) hours as needed for wheezing.     apixaban 2.5 MG Tabs tablet  Commonly known as:  ELIQUIS  Take 1 tab po q12 hours x 14 days following surgery to prevent blood clots     bisacodyl 5 MG EC tablet  Commonly known as:  DULCOLAX  Take 1 tablet (5 mg total) by mouth daily as needed for moderate constipation.     Calcium Carbonate-Vitamin D 600-400 MG-UNIT tablet  Commonly known as:  CALCIUM 600/VITAMIN D  Take 2 tablets by mouth daily.     carbidopa-levodopa 25-100 MG tablet  Commonly known as:  SINEMET  Take 1 tablet by mouth 3 (three) times daily. Take 4 hours apart.     hydrochlorothiazide 12.5  MG tablet  Commonly known as:  HYDRODIURIL  Take 1 tablet (12.5 mg total) by mouth daily.     HYDROcodone-acetaminophen 5-325 MG tablet  Commonly known as:  NORCO  Take 1 tablet by mouth every 4 (four) hours as needed for moderate pain.     lisinopril 20 MG tablet  Commonly known as:  PRINIVIL,ZESTRIL  Take 1 tablet (20 mg total) by mouth daily.     ondansetron 4 MG tablet  Commonly known as:  ZOFRAN  Take 1 tablet (4 mg total) by mouth every 8 (eight) hours as needed for nausea or vomiting.        Diagnostic Studies: Dg Knee Left Port  09/23/2015  CLINICAL DATA:  Post-op left knee replacement EXAM: PORTABLE LEFT KNEE - 1-2 VIEW COMPARISON:  06/25/2014 FINDINGS: Status post knee arthroplasty. Soft tissue in joint space gas are present following surgery. There is normal alignment. No dislocation or acute fracture identified. IMPRESSION: Status post knee arthroplasty.  No adverse features identified. Electronically Signed   By: Nolon Nations M.D.   On: 09/23/2015 11:03    Disposition: 01-Home or Self Care    Follow-up Information    Follow up with Ninetta Lights, MD. Schedule an appointment as soon as possible for a visit in 1 week.   Specialty:  Orthopedic Surgery   Contact information:   [address] [place] [address] [phone]        Signed: Jari Pigg 09/26/2015, 10:51 AM

## 2015-09-24 ENCOUNTER — Encounter (HOSPITAL_COMMUNITY): Payer: Self-pay | Admitting: Orthopedic Surgery

## 2015-09-24 LAB — BASIC METABOLIC PANEL
Anion gap: 7 (ref 5–15)
BUN: 11 mg/dL (ref 6–20)
CALCIUM: 8.5 mg/dL — AB (ref 8.9–10.3)
CO2: 24 mmol/L (ref 22–32)
Chloride: 103 mmol/L (ref 101–111)
Creatinine, Ser: 0.8 mg/dL (ref 0.44–1.00)
GFR calc Af Amer: >60 mL/min (ref >60)
GLUCOSE: 150 mg/dL — AB (ref 65–99)
POTASSIUM: 3.8 mmol/L (ref 3.5–5.1)
SODIUM: 134 mmol/L — AB (ref 135–145)

## 2015-09-24 LAB — GLUCOSE, CAPILLARY
GLUCOSE-CAPILLARY: 143 mg/dL — AB (ref 65–99)
GLUCOSE-CAPILLARY: 123 mg/dL — AB (ref 65–99)
Glucose-Capillary: 122 mg/dL — ABNORMAL HIGH (ref 65–99)
Glucose-Capillary: 111 mg/dL — ABNORMAL HIGH (ref 65–99)

## 2015-09-24 LAB — CBC
HCT: 33.8 % — ABNORMAL LOW (ref 36.0–46.0)
HEMOGLOBIN: 11.3 g/dL — AB (ref 12.0–15.0)
MCH: 31.4 pg (ref 26.0–34.0)
MCHC: 33.4 g/dL (ref 30.0–36.0)
MCV: 93.9 fL (ref 78.0–100.0)
Platelets: 190 10*3/uL (ref 150–400)
RBC: 3.6 MIL/uL — AB (ref 3.87–5.11)
RDW: 13.1 % (ref 11.5–15.5)
WBC: 10.6 10*3/uL — ABNORMAL HIGH (ref 4.0–10.5)

## 2015-09-24 MED ORDER — LORAZEPAM 2 MG/ML IJ SOLN
1.0000 mg | Freq: Four times a day (QID) | INTRAMUSCULAR | Status: DC | PRN
Start: 2015-09-24 — End: 2015-09-26
  Administered 2015-09-24: 1 mg via INTRAVENOUS
  Filled 2015-09-24 (×2): qty 1

## 2015-09-24 MED ORDER — HYDROCODONE-ACETAMINOPHEN 5-325 MG PO TABS
1.0000 | ORAL_TABLET | Freq: Four times a day (QID) | ORAL | Status: DC | PRN
  Administered 2015-09-25 (×3): 1 via ORAL
  Filled 2015-09-24 (×3): qty 1

## 2015-09-24 NOTE — Op Note (Signed)
NAME:  Grayling CongressJONES, Nicole                  ACCOUNT NO.:  [id number]  MEDICAL RECORD NO.:  [id number]  LOCATION:  [id number]                        FACILITY:  [facility]  PHYSICIAN:  Loreta Aveaniel F. Vivien Barretto, M.D. DATE OF BIRTH:  [birth year]  DATE OF PROCEDURE: DATE OF DISCHARGE:                              OPERATIVE REPORT   PREOPERATIVE DIAGNOSIS:  Left knee end-stage arthritis primary generalized.  Marked spur formation.  Marked varus with bone loss, medial side, mostly on the tibia.  POSTOPERATIVE DIAGNOSIS:  Left knee end-stage arthritis primary generalized.  Marked spur formation.  Marked varus with bone loss, medial side, mostly on the tibia.  PROCEDURE:  Left knee modified minimally invasive total knee replacement utilizing Stryker Triathlon prosthesis.  Cemented pegged cruciate retaining #3 femoral component, cemented #3 tibial component, an 11 mm CS insert.  Cemented resurfacing 32-mm patellar component.  SURGEON:  Loreta Aveaniel F. Cyndal Kasson, MD  ASSISTANT:  Mikey KirschnerLindsey Stanberry, PA, present throughout the entire case and necessary for timely completion of procedure.  ANESTHESIA:  Spinal.  BLOOD LOSS:  Minimal.  SPECIMENS:  None.  CULTURES:  None.  COMPLICATIONS:  None.  DRESSINGS:  Soft compressive.  TOURNIQUET TIME:  45 minutes.  PROCEDURE IN DETAIL:  Patient was brought to the operating room and after adequate anesthesia had been obtained, tourniquet applied, prepped and draped in usual sterile fashion.  Exsanguinated with elevation of Esmarch.  Tourniquet inflated to 350 mmHg.  Straight incision above the patella down to tibial tubercle.  Medial arthrotomy, vastus splitting. Marked spurs, loose bodies removed.  8 mm resection, distal femur with flexible intramedullary guide, 5 degrees of valgus.  Using epicondylar axis, the femur was sized, cut, and fitted for a pegged cruciate retaining #3 component.  Proximal tibial resection, extramedullary guide below the defect medially.  Sized to #3  component.  Rotation was set with trials.  An 11-mm insert.  Resurfacing patella 32 mm component. With trials and with trial components in place, I was very pleased by mechanical axis, balancing in flexion, extension, good motion, and good patella tracking.  After the tibia was marked, it was hand reamed.  All trials removed.  Copious irrigation with pulse irrigating device. Cement prepared, placed on all components, firmly seated.  Polyethylene attached to tibia, knee reduced.  Patella with a clamp.  Once cement hardened, the knee was irrigated, again injected with Exparel. Arthrotomy closed with Vicryl.  Subcutaneous and subcuticular closure. Margins were injected with Marcaine.  Sterile compressive dressing applied.  Tourniquet deflated and removed.  Knee immobilizer applied. Anesthesia reversed.  Brought to the recovery room.  Tolerated the surgery well.  No complications.     Loreta Aveaniel F. Arman Loy, M.D.     DFM/MEDQ  D:  09/23/2015  T:  09/24/2015  Job:  [id number]

## 2015-09-24 NOTE — Progress Notes (Signed)
Occupational Therapy Treatment Patient Details Name: Nicole NgoRuby L Baxter MRN: [id number] DOB: 05/25/1941 Today's Date: 09/24/2015    History of present illness 74 y.o. female s/p Lt TKA PMH: parkinsons, Obesity, hypertension, lung disease   OT comments  Attempted to help nsg with transfer to bed and assess progression of functional mobility this pm. [facility] Pt agitated, yelling and attempting to hit staff. Attempted unsuccessfully to de-escalate pt's agitation. Nsg in room. Husband expressing concerns about not being able to "handle his wife at this level".   Follow Up Recommendations  SNF;Supervision/Assistance - 24 hour    Equipment Recommendations  Other (comment)    Recommendations for Other Services      Precautions / Restrictions Precautions Precautions: Fall;Knee Precaution Booklet Issued: No Restrictions Weight Bearing Restrictions: Yes LLE Weight Bearing: Weight bearing as tolerated       Mobility   Balance    ADL       Functional mobility during ADLs: +2 for physical assistance;Maximal assistance;Cueing for sequencing;Cueing for safety General ADL Comments: Per nsg request, attempted to help nsg with tranfser back to bed. Pt yelling at CNA. Legrest lowered. Attempted to mobilize pt, however, pt attempting to hit at therapist.  Nsg notified of unsafe situation at this time. Pt reclined back into chair. CNA present. Husband present. Nsg to notify physician of pt's current status and difficulty with mobilizing pt due to apparent agitation.       Vision                     Perception     Praxis      Cognition   Behavior During Therapy: Flat affect; impulsive;agitated Overall Cognitive Status: Impaired/Different from baseline Area of Impairment: Orientation;Attention;Following commands;Safety/judgement;Awareness;Problem solving Orientation Level: Disoriented to;Time Current Attention Level: Sustained Memory: Decreased recall of  precautions;Decreased short-term memory  Following Commands: Follows one step commands with increased time Safety/Judgement: Decreased awareness of safety;Decreased awareness of deficits Awareness: Emergent Problem Solving: Slow processing;Decreased initiation;Difficulty sequencing;Requires verbal cues General Comments: no longer lethargic but agitated, cussing and yelling at staff     Extremity/Trunk Assessment   Exercises   Shoulder Instructions   General Comments      Pertinent Vitals/ Pain       Pain Assessment: Faces Faces Pain Scale: Hurts little more Pain Location: unable to determine Pain Descriptors / Indicators: Grimacing;Guarding Pain Intervention(s): Limited activity within patient's tolerance;Monitored during session  Home Living Family/patient expects to be discharged to:: Skilled nursing facility Living Arrangements: Spouse/significant other Available Help at Discharge: Available 24 hours/day;Family Type of Home: Mobile home Home Access: Ramped entrance     Home Layout: One level               Home Equipment: Environmental consultantWalker - 2 wheels          Prior Functioning/Environment Level of Independence: Needs assistance  Gait / Transfers Assistance Needed: using rw with mobility; husband helps with transfers minimally ADL's / Homemaking Assistance Needed: reports husband assisting with dressing and bathing needs       Frequency Min 2X/week     Progress Toward Goals  OT Goals(current goals can now be found in the care plan section)  Progress towards OT goals: Not progressing toward goals - comment (due to agitation)  Acute Rehab OT Goals Patient Stated Goal: move better OT Goal Formulation: With family Time For Goal Achievement: 10/08/15 Potential to Achieve Goals: Fair ADL Goals Pt Will Transfer to Toilet: with min  assist;bedside commode;stand pivot transfer (with caregiver assisting) Pt Will Perform Toileting - Clothing Manipulation and hygiene: with min  assist;sit to/from stand;with caregiver independent in assisting Additional ADL Goal #1: Husband will demonstrate ability to assist pt with ADL at level safe for D/C home (min A)  Plan Discharge plan remains appropriate    Co-evaluation                 End of Session Equipment Utilized During Treatment: Gait belt CPM Left Knee CPM Left Knee: Off   Activity Tolerance Treatment limited secondary to agitation   Patient Left in chair;with call bell/phone within reach;with nursing/sitter in room;with family/visitor present   Nurse Communication Mobility status;Other (comment) (need to notify physician of increased agitation)        Time: 1352-1405 OT Time Calculation (min): 13 min  Charges: OT General Charges  OT Treatments $Self Care/Home Management : 8-22 mins  Jayna Mulnix,HILLARY 09/24/2015, 2:42 PM   [facility], OTR/L  [phone] 09/24/2015

## 2015-09-24 NOTE — Clinical Social Work Note (Addendum)
CSW received consult for SNF for patient to have short term rehab.  CSW met with patient and her family to discuss SNF placement and process.  Patient's family in agreement to patient going to SNF for short term rehab, CSW to begin SNF search process, formal assessment to follow.  Patient's family requesting [facility], CSW informed patient's family that [facility] may not have a bed available, and they gave permission to explore other SNF facilities in [place] and [place].  Dishner Broom. [place], MSW, [place] 09/24/2015 5:47 PM

## 2015-09-24 NOTE — Progress Notes (Signed)
Subjective: 1 Day Post-Op Procedure(s) (LRB): LEFT TOTAL KNEE ARTHROPLASTY (Left) Patient reports pain as mild.  Patient resting comfortably and is in CPM without pain.    Objective: Vital signs in last 24 hours: Temp:  [97.7 F (36.5 C)-98.3 F (36.8 C)] 98.1 F (36.7 C) (10/27 0557) Pulse Rate:  [49-91] 91 (10/27 0557) Resp:  [11-20] 19 (10/27 0557) BP: (118-165)/(71-100) 121/93 mmHg (10/27 0557) SpO2:  [96 %-100 %] 96 % (10/27 0557) Weight:  [68.72 kg (151 lb 8 oz)] 68.72 kg (151 lb 8 oz) (10/26 [id number])  Intake/Output from previous day: 10/26 0701 - 10/27 0700 In: 2120 [P.O.:120; I.V.:2000] Out: 450 [Urine:450] Intake/Output this shift: Total I/O In: -  Out: 400 [Urine:400]   Recent Labs  09/24/15 0456  HGB 11.3*    Recent Labs  09/24/15 0456  WBC 10.6*  RBC 3.60*  HCT 33.8*  PLT 190   No results for input(s): NA, K, CL, CO2, BUN, CREATININE, GLUCOSE, CALCIUM in the last 72 hours. No results for input(s): LABPT, INR in the last 72 hours.  Neurologically intact Neurovascular intact Sensation intact distally Intact pulses distally Dorsiflexion/Plantar flexion intact Compartment soft  Negative homans bilaterally   Assessment/Plan: 1 Day Post-Op Procedure(s) (LRB): LEFT TOTAL KNEE ARTHROPLASTY (Left) Advance diet Up with therapy D/C IV fluids Discharge home with home health today vs. Tomorrow (depending on how well she does with PT) WBAT LLE ABLA-mild and stable Dry dressing change prn  Otilio SaberM Lindsey Stanbery 09/24/2015, 6:28 AM

## 2015-09-24 NOTE — Significant Event (Signed)
Rapid Response Event Note  Overview: Time Called: 1940 Arrival Time: 1945 Event Type: Other (Comment)  Initial Focused Assessment:  Called by RN to evaluate patient for unresponsiveness.  Upon my arrival to patients room, RN and family at bedside.  Patient in chair, not following commands or communicating.  Light sternal rub done and patient opened eyes briefly and pushed my hand away.  While in room assessing patient did open eyes spontaneously x 2, and moaned/mumbled.   Interventions:  Patient will not follow commands, attempted to lift patient extremities up and patient resisting my efforts, will not allow me to open eyes.  Skin warm and dry.  VSS 116/70, 100% on nasal cannula 2 lPm, RR 13.  NAD noted.   Event Summary:  Recommend Rn to call MD, RN stated PA was notified    at      at          [facility], Maryagnes Amosenise Ann

## 2015-09-24 NOTE — Discharge Instructions (Signed)
INSTRUCTIONS AFTER JOINT REPLACEMENT   o Remove items at home which could result in a fall. This includes throw rugs or furniture in walking pathways o ICE to the affected joint every three hours while awake for 30 minutes at a time, for at least the first 3-5 days, and then as needed for pain and swelling.  Continue to use ice for pain and swelling. You may notice swelling that will progress down to the foot and ankle.  This is normal after surgery.  Elevate your leg when you are not up walking on it.   o Continue to use the breathing machine you got in the hospital (incentive spirometer) which will help keep your temperature down.  It is common for your temperature to cycle up and down following surgery, especially at night when you are not up moving around and exerting yourself.  The breathing machine keeps your lungs expanded and your temperature down.  BLOOD THINNER: TAKE ELIQUIS AS DIRECTED FOR A TOTAL OF 14 DAYS FOLLOWING SURGERY.  ONCE FINISHED WITH THIS, TAKE ASPIRIN 325MG  ONE TAB ONCE DAILY FOR THE NEXT 14 DAYS.  THESE ARE BOTH USED TO PREVENT  BLOOD CLOTS.  DIET:  As you were doing prior to hospitalization, we recommend a well-balanced diet.  DRESSING / WOUND CARE / SHOWERING  You may change your dressing 3-5 days after surgery.  Then change the dressing every day with sterile gauze.  Please use good hand washing techniques before changing the dressing.  Do not use any lotions or creams on the incision until instructed by your surgeon. and You may shower 3 days after surgery, but keep the wounds dry during showering.  You may use an occlusive plastic wrap (Press'n Seal for example), NO SOAKING/SUBMERGING IN THE BATHTUB.  If the bandage gets wet, change with a clean dry gauze.  If the incision gets wet, pat the wound dry with a clean towel.  ACTIVITY  o Increase activity slowly as tolerated, but follow the weight bearing instructions below.   o No driving for 6 weeks or until further  direction given by your physician.  You cannot drive while taking narcotics.  o No lifting or carrying greater than 10 lbs. until further directed by your surgeon. o Avoid periods of inactivity such as sitting longer than an hour when not asleep. This helps prevent blood clots.  o You may return to work once you are authorized by your doctor.     WEIGHT BEARING   Weight bearing as tolerated with assist device (walker, cane, etc) as directed, use it as long as suggested by your surgeon or therapist, typically at least 4-6 weeks.   EXERCISES  Results after joint replacement surgery are often greatly improved when you follow the exercise, range of motion and muscle strengthening exercises prescribed by your doctor. Safety measures are also important to protect the joint from further injury. Any time any of these exercises cause you to have increased pain or swelling, decrease what you are doing until you are comfortable again and then slowly increase them. If you have problems or questions, call your caregiver or physical therapist for advice.   Rehabilitation is important following a joint replacement. After just a few days of immobilization, the muscles of the leg can become weakened and shrink (atrophy).  These exercises are designed to build up the tone and strength of the thigh and leg muscles and to improve motion. Often times heat used for twenty to thirty minutes before working out  will loosen up your tissues and help with improving the range of motion but do not use heat for the first two weeks following surgery (sometimes heat can increase post-operative swelling).   These exercises can be done on a training (exercise) mat, on the floor, on a table or on a bed. Use whatever works the best and is most comfortable for you.    Use music or television while you are exercising so that the exercises are a pleasant break in your day. This will make your life better with the exercises acting as a  break in your routine that you can look forward to.   Perform all exercises about fifteen times, three times per day or as directed.  You should exercise both the operative leg and the other leg as well.  Exercises include:    Quad Sets - Tighten up the muscle on the front of the thigh (Quad) and hold for 5-10 seconds.    Straight Leg Raises - With your knee straight (if you were given a brace, keep it on), lift the leg to 60 degrees, hold for 3 seconds, and slowly lower the leg.  Perform this exercise against resistance later as your leg gets stronger.   Leg Slides: Lying on your back, slowly slide your foot toward your buttocks, bending your knee up off the floor (only go as far as is comfortable). Then slowly slide your foot back down until your leg is flat on the floor again.   Angel Wings: Lying on your back spread your legs to the side as far apart as you can without causing discomfort.   Hamstring Strength:  Lying on your back, push your heel against the floor with your leg straight by tightening up the muscles of your buttocks.  Repeat, but this time bend your knee to a comfortable angle, and push your heel against the floor.  You may put a pillow under the heel to make it more comfortable if necessary.   A rehabilitation program following joint replacement surgery can speed recovery and prevent re-injury in the future due to weakened muscles. Contact your doctor or a physical therapist for more information on knee rehabilitation.    CONSTIPATION  Constipation is defined medically as fewer than three stools per week and severe constipation as less than one stool per week.  Even if you have a regular bowel pattern at home, your normal regimen is likely to be disrupted due to multiple reasons following surgery.  Combination of anesthesia, postoperative narcotics, change in appetite and fluid intake all can affect your bowels.   YOU MUST use at least one of the following options; they are  listed in order of increasing strength to get the job done.  They are all available over the counter, and you may need to use some, POSSIBLY even all of these options:    Drink plenty of fluids (prune juice may be helpful) and high fiber foods Colace 100 mg by mouth twice a day  Senokot for constipation as directed and as needed Dulcolax (bisacodyl), take with full glass of water  Miralax (polyethylene glycol) once or twice a day as needed.  If you have tried all these things and are unable to have a bowel movement in the first 3-4 days after surgery call either your surgeon or your primary doctor.    If you experience loose stools or diarrhea, hold the medications until you stool forms back up.  If your symptoms do not get better  within 1 week or if they get worse, check with your doctor.  If you experience "the worst abdominal pain ever" or develop nausea or vomiting, please contact the office immediately for further recommendations for treatment.   ITCHING:  If you experience itching with your medications, try taking only a single pain pill, or even half a pain pill at a time.  You can also use Benadryl over the counter for itching or also to help with sleep.   TED HOSE STOCKINGS:  Use stockings on both legs until for at least 2 weeks or as directed by physician office. They may be removed at night for sleeping.  MEDICATIONS:  See your medication summary on the After Visit Summary that nursing will review with you.  You may have some home medications which will be placed on hold until you complete the course of blood thinner medication.  It is important for you to complete the blood thinner medication as prescribed.  PRECAUTIONS:  If you experience chest pain or shortness of breath - call 911 immediately for transfer to the hospital emergency department.   If you develop a fever greater that 101 F, purulent drainage from wound, increased redness or drainage from wound, foul odor from the  wound/dressing, or calf pain - CONTACT YOUR SURGEON.                                                   FOLLOW-UP APPOINTMENTS:  If you do not already have a post-op appointment, please call the office for an appointment to be seen by your surgeon.  Guidelines for how soon to be seen are listed in your After Visit Summary, but are typically between 1-4 weeks after surgery.  OTHER INSTRUCTIONS:   Knee Replacement:  Do not place pillow under knee, focus on keeping the knee straight while resting. CPM instructions: 0-90 degrees, 2 hours in the morning, 2 hours in the afternoon, and 2 hours in the evening. Place foam block, curve side up under heel at all times except when in CPM or when walking.  DO NOT modify, tear, cut, or change the foam block in any way.  MAKE SURE YOU:   Understand these instructions.   Get help right away if you are not doing well or get worse.    Thank you for letting us be a part of your medical care team.  It is a privilege we respect greatly.  We hope these instructions will help you stay on track for a fast and full recovery!   Information on my medicine - ELIQUIS (apixaban)  This medication education was reviewed with me or my healthcare representative as part of my discharge preparation.  The pharmacist that spoke with me during my hospital stay was:  Tad Moore, [facility]  Why was Eliquis prescribed for you? Eliquis was prescribed for you to reduce the risk of blood clots forming after orthopedic surgery.    What do You need to know about Eliquis? Take your Eliquis TWICE DAILY - one tablet in the morning and one tablet in the evening with or without food.  It would be best to take the dose about the same time each day.  If you have difficulty swallowing the tablet whole please discuss with your pharmacist how to take the medication safely.  Take Eliquis exactly as prescribed by  your doctor and DO NOT stop taking Eliquis without talking to the doctor  who prescribed the medication.  Stopping without other medication to take the place of Eliquis may increase your risk of developing a clot.  After discharge, you should have regular check-up appointments with your healthcare provider that is prescribing your Eliquis.  What do you do if you miss a dose? If a dose of ELIQUIS is not taken at the scheduled time, take it as soon as possible on the same day and twice-daily administration should be resumed.  The dose should not be doubled to make up for a missed dose.  Do not take more than one tablet of ELIQUIS at the same time.  Important Safety Information A possible side effect of Eliquis is bleeding. You should call your healthcare provider right away if you experience any of the following: ? Bleeding from an injury or your nose that does not stop. ? Unusual colored urine (red or dark brown) or unusual colored stools (red or black). ? Unusual bruising for unknown reasons. ? A serious fall or if you hit your head (even if there is no bleeding).  Some medicines may interact with Eliquis and might increase your risk of bleeding or clotting while on Eliquis. To help avoid this, consult your healthcare provider or pharmacist prior to using any new prescription or non-prescription medications, including herbals, vitamins, non-steroidal anti-inflammatory drugs (NSAIDs) and supplements.  This website has more information on Eliquis (apixaban): http://www.eliquis.com/eliquis/home

## 2015-09-24 NOTE — Progress Notes (Signed)
RN came into room d/t pt screaming uncontrollably.  When entering the room, husband at bedside, across room on the phone with pt's son.  When interviewing pt about screaming, pt continued screaming profanities.  Pt would not let staff ambulate her to bed from chair.  Pt was also told this morning after PT/OT by this RN she will not be safe going home with husband d/t x3 max assist. PA notified.  STAT SW consult d/t SNFplacement for pt, Ativan 1mg  PRN IV q6hrs for agitation/anxiety.  Discontinue Robaxin 500mg  q6 po for spasms.  UA will also be performed per protocol.  Nsg to continue to monitor for status changes.

## 2015-09-24 NOTE — Progress Notes (Signed)
Occupational Therapy Evaluation Patient Details Name: Nicole NgoRuby L Gong MRN: [id number] DOB: 08/17/1941 Today's Date: 09/24/2015    History of Present Illness 74 y.o. female s/p Lt TKA PMH: parkinsons, Obesity, hypertension, lung disease   Clinical Impression   PTA, husband assisted minimally with mobility @ RW level and mod A with ADL. Pt currently requires Max A +2 for mobility and total A for ADL. Pt will benefit from skilled services at a SNF to facilitate safe D/C home with husband. Husband in agreement with SNF as he states he can not "handle her at this level". Will follow acutely to address established goals and facilitate D/C to next venue of care.     Follow Up Recommendations  SNF;Supervision/Assistance - 24 hour    Equipment Recommendations  Other (comment) (TBA at SNF)    Recommendations for Other Services       Precautions / Restrictions Precautions Precautions: Fall;Knee Precaution Booklet Issued: No Restrictions Weight Bearing Restrictions: Yes LLE Weight Bearing: Weight bearing as tolerated      Mobility Bed Mobility Overal bed mobility: +2 for physical assistance;Needs Assistance;+ 2 for safety/equipment Bed Mobility: Supine to Sit     Supine to sit: +2 for physical assistance;+2 for safety/equipment;HOB elevated;Max assist     General bed mobility comments: Pt up in chair  Transfers Overall transfer level: Needs assistance Equipment used: Rolling walker (2 wheeled) Transfers: Sit to/from BJ'sStand;Stand Pivot Transfers Sit to Stand: Max assist;+2 physical assistance Stand pivot transfers: +2 physical assistance;Max assist       General transfer comment: unable to use RW. required use of +2 Max with gait belt and lift pad    Balance Overall balance assessment: Needs assistance Sitting-balance support: Single extremity supported;Feet supported Sitting balance-Leahy Scale: Poor Sitting balance - Comments: Pt. was unable to maintain proper sitting  posture on EOB; therapist stabilized trunk    Standing balance support: Bilateral upper extremity supported Standing balance-Leahy Scale: Poor Standing balance comment: Max. assist and RW needed to maintain balance                            ADL Overall ADL's : Needs assistance/impaired Eating/Feeding:  (husband assisting due to tremors)   Grooming: Maximal assistance   Upper Body Bathing: Maximal assistance   Lower Body Bathing: Maximal assistance;Sit to/from stand   Upper Body Dressing : Maximal assistance   Lower Body Dressing: Total assistance   Toilet Transfer: +2 for physical assistance;Maximal assistance;BSC;Stand-pivot;Cueing for sequencing;Cueing for safety   Toileting- Clothing Manipulation and Hygiene: Total assistance;Sit to/from stand;+2 for physical assistance       Functional mobility during ADLs: +2 for physical assistance;Maximal assistance;Cueing for sequencing;Cueing for safety General ADL Comments: At [place], husband assists with ADL and mobility as needed; however, husband unable to assist at this level. Pt currently requies +2Max to toal A  ADL significantly impacted by apparent Parkinson's symptoms     Vision     Perception     Praxis      Pertinent Vitals/Pain Pain Assessment: Faces Faces Pain Scale: Hurts a little bit Pain Location: L knee Pain Descriptors / Indicators: Grimacing Pain Intervention(s): Limited activity within patient's tolerance     Hand Dominance     Extremity/Trunk Assessment Upper Extremity Assessment Upper Extremity Assessment: Generalized weakness (limited strength. LUE stronger than R; tremors B)   Lower Extremity Assessment Lower Extremity Assessment: Defer to PT evaluation;LLE deficits/detail LLE Deficits / Details: LTKA   Cervical / Trunk  Assessment Cervical / Trunk Assessment: Normal   Communication Communication Communication: No difficulties   Cognition Arousal/Alertness:  Awake/alert Behavior During Therapy: Flat affect Overall Cognitive Status: Impaired/Different from baseline Area of Impairment: Orientation;Attention;Following commands;Safety/judgement;Awareness;Problem solving Orientation Level: Disoriented to;Time Current Attention Level: Sustained Memory: Decreased recall of precautions;Decreased short-term memory Following Commands: Follows one step commands with increased time Safety/Judgement: Decreased awareness of safety;Decreased awareness of deficits Awareness: Emergent Problem Solving: Slow processing;Decreased initiation;Difficulty sequencing;Requires verbal cues General Comments: lethargic but participated with transfer to toilet.  Husband states this is not her baseline cognitive level.   General Comments       Exercises Exercises: Total Joint     Shoulder Instructions      Home Living Family/patient expects to be discharged to:: Skilled nursing facility Living Arrangements: Spouse/significant other Available Help at Discharge: Available 24 hours/day;Family Type of Home: Mobile home Home Access: Ramped entrance     Home Layout: One level               Home Equipment: Walker - 2 wheels          Prior Functioning/Environment Level of Independence: Needs assistance  Gait / Transfers Assistance Needed: using rw with mobility; husband helps with transfers minimally ADL's / Homemaking Assistance Needed: reports husband assisting with dressing and bathing needs        OT Diagnosis: Generalized weakness;Cognitive deficits;Acute pain   OT Problem List: Decreased strength;Decreased range of motion;Decreased activity tolerance;Impaired balance (sitting and/or standing);Decreased coordination;Decreased cognition;Decreased safety awareness;Decreased knowledge of use of DME or AE;Decreased knowledge of precautions;Impaired tone;Impaired UE functional use;Pain   OT Treatment/Interventions: Self-care/ADL training;DME and/or AE  instruction;Therapeutic activities;Cognitive remediation/compensation;Patient/family education;Balance training    OT Goals(Current goals can be found in the care plan section) Acute Rehab OT Goals Patient Stated Goal: move better OT Goal Formulation: With family Time For Goal Achievement: 10/08/15 Potential to Achieve Goals: Fair  OT Frequency: Min 2X/week   Barriers to D/C:            Co-evaluation              End of Session Equipment Utilized During Treatment: Gait belt CPM Left Knee CPM Left Knee: Off Nurse Communication: Mobility status  Activity Tolerance: Patient limited by lethargy Patient left: in chair;with call bell/phone within reach;with chair alarm set;with family/visitor present   Time: 1210-1231 OT Time Calculation (min): 21 min Charges:  OT General Charges $OT Visit: 1 Procedure OT Evaluation $Initial OT Evaluation Tier I: 1 Procedure G-Codes:    Hoda Hon,HILLARY 09/27/2015, 2:26 PM   [facility], OTR/L  [phone] 09-27-2015

## 2015-09-24 NOTE — Progress Notes (Signed)
Physical Therapy Treatment Patient Details Name: Nicole Baxter MRN: [id number] DOB: 05-26-1941 Today's Date: 09/24/2015    History of Present Illness 74 y.o. female s/p Lt TKA PMH: parkinsons, Obesity, hypertension, lung disease    PT Comments    The pt. was very lethargic this morning and took a great amount of verbal and tactile cues to arouse.  She was in and out of lethargy during entire treatment.  She continues to require a great amount of assistance with bed mobility and transfers.  She is able to maintain holding on to the walker if her hands are placed by therapist but has difficulty standing up straight and shifting her COG forward.  Ambulation may be possible with +3 assist if patient is more alert/awake next treatment.     Follow Up Recommendations  SNF;Supervision/Assistance - 24 hour     Equipment Recommendations  None recommended by PT;Other (comment) (Pt. reports having RW at home)       Precautions / Restrictions Precautions Precautions: Fall;Knee Precaution Booklet Issued: No Restrictions Weight Bearing Restrictions: Yes LLE Weight Bearing: Weight bearing as tolerated    Mobility  Bed Mobility Overal bed mobility: +2 for physical assistance;Needs Assistance;+ 2 for safety/equipment Bed Mobility: Supine to Sit     Supine to sit: +2 for physical assistance;+2 for safety/equipment;HOB elevated;Total assist     General bed mobility comments: HOB slightly elevated; Pt. was unable to help due to lethargy; +3 - 1 therapist at feet, 2 therapists at trunk   Transfers Overall transfer level: Needs assistance Equipment used: Rolling walker (2 wheeled) Transfers: Sit to/from [employer] Sit to Stand: Max assist;+2 physical assistance;+2 safety/equipment Stand pivot transfers: Max assist;+2 physical assistance;+2 safety/equipment       General transfer comment: encouragement for patient to assist with transfers. Manual assistance needed to move UEs  from bed to walker. Heavy cues and physical assist needed throughout transfers. Pt. was able to slightly turn trunk but was not able to move feet or step to turn Patient transferring from bed to chair; was asked if pt. needed to use the [facility], pt. denied.       Balance Overall balance assessment: Needs assistance Sitting-balance support: Feet supported Sitting balance-Leahy Scale: Zero Sitting balance - Comments: Pt. was unable to maintain proper sitting posture on EOB; therapist stabilized trunk    Standing balance support: Bilateral upper extremity supported Standing balance-Leahy Scale: Zero Standing balance comment: Max. assist and RW needed to maintain balance                    Cognition Arousal/Alertness: Lethargic;Suspect due to medications Behavior During Therapy: Flat affect Overall Cognitive Status: Impaired/Different from baseline Area of Impairment: Following commands;Safety/judgement;Awareness;Problem solving;Memory;Attention Orientation Level: Disoriented to;Time Current Attention Level: Focused Memory: Decreased recall of precautions;Decreased short-term memory Following Commands: Follows one step commands inconsistently;Follows one step commands with increased time Safety/Judgement: Decreased awareness of safety;Decreased awareness of deficits Awareness: Intellectual Problem Solving: Slow processing;Decreased initiation;Difficulty sequencing;Requires verbal cues;Requires tactile cues General Comments: Pt. would open eyes and respond after a few verbal cues; pt. was slightly more alert at end of treatment but still had difficulty keeping eyes open    Exercises Total Joint Exercises Ankle Circles/Pumps: PROM;Left;10 reps;Seated Heel Slides: PROM;Left;10 reps;Seated Hip ABduction/ADduction: PROM;Left;10 reps;Seated Straight Leg Raises: PROM;Left;10 reps;Seated    General Comments General comments (skin integrity, edema, etc.): Max. assist and RW needed to  maintain balance      Pertinent Vitals/Pain Pain Assessment: No/denies pain Faces  Pain Scale: Hurts little more Pain Location: unable to determine Pain Descriptors / Indicators: Grimacing;Guarding Pain Intervention(s): Limited activity within patient's tolerance;Monitored during session           PT Goals (current goals can now be found in the care plan section) Acute Rehab PT Goals Patient Stated Goal: none stated due to lethargy Progress towards PT goals: Progressing toward goals    Frequency  7X/week    PT Plan Current plan remains appropriate       End of Session Equipment Utilized During Treatment: Gait belt Activity Tolerance: Patient limited by lethargy;No increased pain Patient left: in chair;with call bell/phone within reach     Time: [id number] PT Time Calculation (min) (ACUTE ONLY): 28 min  Charges:  1 TA, 1 TE                    Leota JacobsenMcKinnley Cathie Bonnell, [place]PTA  [phone] - Office   09/24/2015, 2:51 PM

## 2015-09-24 NOTE — Progress Notes (Addendum)
PT Cancellation Note  Patient Details Name: Nicole Baxter MRN: [id number] DOB: 07/16/1941   Cancelled Treatment:    Reason Eval/Treat Not Completed: Other (comment).  Pt now more alert, but very agitated yelling and swinging at staff.  PT holding on second session today.  PT to check back in AM.  Thanks,    Lurena Joinerebecca B. Pryce Folts, PT, DPT [phone]   09/24/2015, 2:34 PM

## 2015-09-24 NOTE — Progress Notes (Signed)
Utilization review completed.  

## 2015-09-25 LAB — CBC
HCT: 30.4 % — ABNORMAL LOW (ref 36.0–46.0)
HEMOGLOBIN: 9.9 g/dL — AB (ref 12.0–15.0)
MCH: 30.3 pg (ref 26.0–34.0)
MCHC: 32.6 g/dL (ref 30.0–36.0)
MCV: 93 fL (ref 78.0–100.0)
Platelets: 176 10*3/uL (ref 150–400)
RBC: 3.27 MIL/uL — ABNORMAL LOW (ref 3.87–5.11)
RDW: 13 % (ref 11.5–15.5)
WBC: 9.1 10*3/uL (ref 4.0–10.5)

## 2015-09-25 LAB — URINALYSIS, ROUTINE W REFLEX MICROSCOPIC
Bilirubin Urine: NEGATIVE
Glucose, UA: NEGATIVE mg/dL
HGB URINE DIPSTICK: NEGATIVE
KETONES UR: NEGATIVE mg/dL
LEUKOCYTES UA: NEGATIVE
Nitrite: NEGATIVE
PROTEIN: NEGATIVE mg/dL
Specific Gravity, Urine: 1.012 (ref 1.005–1.030)
UROBILINOGEN UA: 0.2 mg/dL (ref 0.0–1.0)
pH: 5.5 (ref 5.0–8.0)

## 2015-09-25 LAB — GLUCOSE, CAPILLARY
GLUCOSE-CAPILLARY: 97 mg/dL (ref 65–99)
GLUCOSE-CAPILLARY: 106 mg/dL — AB (ref 65–99)
Glucose-Capillary: 116 mg/dL — ABNORMAL HIGH (ref 65–99)
Glucose-Capillary: 98 mg/dL (ref 65–99)

## 2015-09-25 LAB — BASIC METABOLIC PANEL
Anion gap: 5 (ref 5–15)
BUN: 14 mg/dL (ref 6–20)
CALCIUM: 8.9 mg/dL (ref 8.9–10.3)
CO2: 29 mmol/L (ref 22–32)
CREATININE: 1 mg/dL (ref 0.44–1.00)
Chloride: 103 mmol/L (ref 101–111)
GFR, EST NON AFRICAN AMERICAN: 55 mL/min — AB (ref >60)
Glucose, Bld: 113 mg/dL — ABNORMAL HIGH (ref 65–99)
Potassium: 3.9 mmol/L (ref 3.5–5.1)
SODIUM: 137 mmol/L (ref 135–145)

## 2015-09-25 MED ORDER — HYDROCODONE-ACETAMINOPHEN 5-325 MG PO TABS: 1.0000 | ORAL_TABLET | ORAL | Status: DC | PRN

## 2015-09-25 NOTE — [place] FL2 (Signed)
[place] MEDICAID FL2 LEVEL OF CARE SCREENING TOOL     IDENTIFICATION  Patient Name: Nicole Baxter Birthdate: 09/08/1941 Sex: female Admission Date (Current Location): 09/23/2015  [facility] [place]   Facility and Address:  [facility] [place], [address], [place], [place] [address]      Provider Number: [id number]  Attending Physician Name and Address:  Loreta Aveaniel F Murphy, MD  Relative Name and Phone Number:       Current Level of Care: SNF Recommended Level of Care: Skilled Nursing Facility Prior Approval Number:    Date Approved/Denied:   PASRR Number: [id number]  Discharge Plan: SNF    Current Diagnoses: Patient Active Problem List   Diagnosis Date Noted  . DJD (degenerative joint disease) of knee 09/23/2015  . Tremor 04/14/2015  . Diverticulosis 03/10/2015  . Internal hemorrhoids 03/10/2015  . Osteopenia 03/02/2015  . Elevated liver enzymes 03/02/2015  . History of colon polyps 12/25/2014  . Vitamin D deficiency 12/25/2014  . Tendinopathy of right rotator cuff 02/21/2014  . Health care maintenance 02/13/2014  . Subclinical hyperthyroidism 12/25/2013  . Right arm pain 12/24/2013  . Ambulatory dysfunction 12/06/2013  . Kidney mass 06/11/2012  . NSAID-associated gastropathy 06/06/2012  . Right kidney mass 06/06/2012  . Osteoarthritis of both knees 03/15/2012  . Asthma 09/20/2011  . Parkinsonism (HCC) 07/13/2010  . Diabetes mellitus type 2, diet-controlled (HCC) 05/22/2009  . ALLERGIC RHINITIS, SEASONAL 02/20/2007  . OCCUPATIONAL ASTHMA 12/11/2006  . Hyperlipidemia 09/27/2006  . OBESITY 09/27/2006  . Essential hypertension 09/27/2006  . GERD 09/27/2006    Orientation ACTIVITIES/SOCIAL BLADDER RESPIRATION    Time, Situation, Place, Self    Incontinent, Indwelling catheter O2 (As needed)  BEHAVIORAL SYMPTOMS/MOOD NEUROLOGICAL BOWEL NUTRITION STATUS      Incontinent, Continent Diet  PHYSICIAN VISITS COMMUNICATION  OF NEEDS Height & Weight Skin    Verbally 5' 1.5" (156.2 cm) 151 lbs. Surgical wounds          AMBULATORY STATUS RESPIRATION    Supervision limited O2 (As needed)      Personal Care Assistance Level of Assistance  Feeding, Dressing, Bathing Bathing Assistance: Limited assistance Feeding assistance: Limited assistance Dressing Assistance: Limited assistance      Functional Limitations Info    Sight Info: Adequate           SPECIAL CARE FACTORS FREQUENCY                      Additional Factors Info                  Current Medications (09/25/2015): Current Facility-Administered Medications  Medication Dose Route Frequency Provider Last Rate Last Dose  . acetaminophen (TYLENOL) tablet 650 mg  650 mg Oral Q6H PRN Cristie HemMary L Stanbery, PA-C       Or  . acetaminophen (TYLENOL) suppository 650 mg  650 mg Rectal Q6H PRN Cristie HemMary L Stanbery, PA-C      . albuterol (PROVENTIL) (2.5 MG/3ML) 0.083% nebulizer solution 3 mL  3 mL Inhalation Q4H PRN Cristie HemMary L Stanbery, PA-C      . alum & mag hydroxide-simeth (MAALOX/MYLANTA) 200-200-20 MG/5ML suspension 30 mL  30 mL Oral Q4H PRN Cristie HemMary L Stanbery, PA-C      . apixaban Everlene Balls(ELIQUIS) tablet 2.5 mg  2.5 mg Oral Q12H Cristie HemMary L Stanbery, PA-C   2.5 mg at 09/25/15 0932  . bisacodyl (DULCOLAX) suppository 10 mg  10 mg Rectal Daily PRN Cristie HemMary L Stanbery,  PA-C      . carbidopa-levodopa (SINEMET IR) 25-100 MG per tablet immediate release 1 tablet  1 tablet Oral TID Cristie Hem, PA-C   1 tablet at 09/25/15 0934  . celecoxib (CELEBREX) capsule 200 mg  200 mg Oral Q12H Cristie Hem, PA-C   200 mg at 09/25/15 0932  . diphenhydrAMINE (BENADRYL) 12.5 MG/5ML elixir 12.5-25 mg  12.5-25 mg Oral Q4H PRN Cristie Hem, PA-C      . docusate sodium (COLACE) capsule 100 mg  100 mg Oral BID Cristie Hem, PA-C   100 mg at 09/24/15 1104  . hydrochlorothiazide (HYDRODIURIL) tablet 12.5 mg  12.5 mg Oral Daily Cristie Hem, PA-C   12.5 mg at 09/24/15 1103  .  HYDROcodone-acetaminophen (NORCO/VICODIN) 5-325 MG per tablet 1-2 tablet  1-2 tablet Oral Q6H PRN Cristie Hem, PA-C   1 tablet at 09/25/15 0934  . HYDROmorphone (DILAUDID) injection 0.5-1 mg  0.5-1 mg Intravenous Q2H PRN Cristie Hem, PA-C   1 mg at 09/24/15 0402  . insulin aspart (novoLOG) injection 0-9 Units  0-9 Units Subcutaneous TID WC Cristie Hem, PA-C   1 Units at 09/24/15 1804  . lisinopril (PRINIVIL,ZESTRIL) tablet 20 mg  20 mg Oral Daily Cristie Hem, PA-C   20 mg at 09/24/15 1104  . LORazepam (ATIVAN) injection 1 mg  1 mg Intravenous Q6H PRN Cristie Hem, PA-C   1 mg at 09/24/15 1427  . magnesium citrate solution 1 Bottle  1 Bottle Oral Once PRN Cristie Hem, PA-C      . menthol-cetylpyridinium (CEPACOL) lozenge 3 mg  1 lozenge Oral PRN Cristie Hem, PA-C       Or  . phenol (CHLORASEPTIC) mouth spray 1 spray  1 spray Mouth/Throat PRN Cristie Hem, PA-C      . metoCLOPramide (REGLAN) tablet 5-10 mg  5-10 mg Oral Q8H PRN Cristie Hem, PA-C       Or  . metoCLOPramide (REGLAN) injection 5-10 mg  5-10 mg Intravenous Q8H PRN Cristie Hem, PA-C      . mometasone-formoterol [facility]) 200-5 MCG/ACT inhaler 2 puff  2 puff Inhalation BID Cristie Hem, PA-C   2 puff at 09/25/15 0848  . ondansetron (ZOFRAN) tablet 4 mg  4 mg Oral Q6H PRN Cristie Hem, PA-C       Or  . ondansetron [facility]) injection 4 mg  4 mg Intravenous Q6H PRN Cristie Hem, PA-C      . polyethylene glycol (MIRALAX / GLYCOLAX) packet 17 g  17 g Oral Daily PRN Cristie Hem, PA-C      . zolpidem (AMBIEN) tablet 5 mg  5 mg Oral QHS PRN Cristie Hem, PA-C       Do not use this list as official medication orders. Please verify with discharge summary.  Discharge Medications:   Medication List    STOP taking these medications        acetaminophen 500 MG tablet  Commonly known as:  TYLENOL     diclofenac sodium 1 % Gel  Commonly known as:  VOLTAREN     Fluticasone-Salmeterol  500-50 MCG/DOSE Aepb  Commonly known as:  ADVAIR      TAKE these medications        albuterol 108 (90 BASE) MCG/ACT inhaler  Commonly known as:  PROAIR HFA  Inhale 2 puffs into the lungs every 4 (four) hours as needed for wheezing.  apixaban 2.5 MG Tabs tablet  Commonly known as:  ELIQUIS  Take 1 tab po q12 hours x 14 days following surgery to prevent blood clots     bisacodyl 5 MG EC tablet  Commonly known as:  DULCOLAX  Take 1 tablet (5 mg total) by mouth daily as needed for moderate constipation.     Calcium Carbonate-Vitamin D 600-400 MG-UNIT tablet  Commonly known as:  CALCIUM 600/VITAMIN D  Take 2 tablets by mouth daily.     carbidopa-levodopa 25-100 MG tablet  Commonly known as:  SINEMET  Take 1 tablet by mouth 3 (three) times daily. Take 4 hours apart.     hydrochlorothiazide 12.5 MG tablet  Commonly known as:  HYDRODIURIL  Take 1 tablet (12.5 mg total) by mouth daily.     HYDROcodone-acetaminophen 5-325 MG tablet  Commonly known as:  NORCO  Take 1 tablet by mouth every 4 (four) hours as needed for moderate pain.     lisinopril 20 MG tablet  Commonly known as:  PRINIVIL,ZESTRIL  Take 1 tablet (20 mg total) by mouth daily.     ondansetron 4 MG tablet  Commonly known as:  ZOFRAN  Take 1 tablet (4 mg total) by mouth every 8 (eight) hours as needed for nausea or vomiting.        Relevant Imaging Results:  Relevant Lab Results:  Recent Labs    Additional Information Full Code  Shizuo Biskup, Ervin Knack, LCSWA

## 2015-09-25 NOTE — Progress Notes (Addendum)
Subjective: 2 Days Post-Op Procedure(s) (LRB): LEFT TOTAL KNEE ARTHROPLASTY (Left) Patient reports pain as mild.  Patient less agitated this am but very upset that she will need to go to SNF.  No nausea/vomiting, lightheadedness/dizziness, chest pain/sob.    Objective: Vital signs in last 24 hours: Temp:  [97.8 F (36.6 C)-99.3 F (37.4 C)] 98.6 F (37 C) (10/28 0649) Pulse Rate:  [81-107] 91 (10/28 0649) Resp:  [18] 18 (10/28 0649) BP: (103-140)/(53-83) 140/83 mmHg (10/28 0649) SpO2:  [94 %-100 %] 100 % (10/28 0649)  Intake/Output from previous day: 10/27 0701 - 10/28 0700 In: 240 [P.O.:240] Out: -  Intake/Output this shift:     Recent Labs  09/24/15 0456 09/25/15 0419  HGB 11.3* 9.9*    Recent Labs  09/24/15 0456 09/25/15 0419  WBC 10.6* 9.1  RBC 3.60* 3.27*  HCT 33.8* 30.4*  PLT 190 176    Recent Labs  09/24/15 0456 09/25/15 0419  NA 134* 137  K 3.8 3.9  CL 103 103  CO2 24 29  BUN 11 14  CREATININE 0.80 1.00  GLUCOSE 150* 113*  CALCIUM 8.5* 8.9   No results for input(s): LABPT, INR in the last 72 hours.  Neurologically intact Neurovascular intact Sensation intact distally Intact pulses distally Dorsiflexion/Plantar flexion intact Compartment soft   Assessment/Plan: 2 Days Post-Op Procedure(s) (LRB): LEFT TOTAL KNEE ARTHROPLASTY (Left) Advance diet Up with therapy Discharge to SNF today WBAT LLE ABLA-mild and stable Will order u/a with cx to r/o UTI Dry dressing change prn Please fax FL2 to our office this AM: fax#[phone]  Otilio SaberM Lindsey Stanbery 09/25/2015, 7:58 AM

## 2015-09-25 NOTE — Clinical Social Work Note (Signed)
CSW presented bed offers to patient and her family.  [facility]'s [facility] but there is not any availability, patient and family [facility] to [facility].  [facility] who said they can take patient tomorrow, CSW notified Jari SportsmanMary Stanbery PA, and RN case Production designer, theatre/television/filmmanager.  Plan for patient to discharge tomorrow once discharge order has been received.  Ervin KnackEric R. Shown Dissinger, MSW, Theresia MajorsLCSWA [phone] 09/25/2015 4:06 PM

## 2015-09-25 NOTE — Progress Notes (Signed)
Physical Therapy Treatment Patient Details Name: Nicole Baxter MRN: [id number] DOB: 1941-01-30 Today's Date: 09/25/2015    History of Present Illness 74 y.o. female s/p Lt TKA PMH: parkinsons, Obesity, hypertension, lung disease    PT Comments    The pt. very well during treatment and was able to give slight assistance with transfers and gait training.  She showed no signs of agitation.  She became more lethargic when positioned in chair after transfer but was able to somewhat help with LE exercises.  Goniometric measurement 0-51 (PROM).  She may be able to ambulate a few more feet with max. assist +2.        Follow Up Recommendations  SNF;Supervision/Assistance - 24 hour     Equipment Recommendations  None recommended by PT;Other (comment) (Pt. reports having RW at home)       Precautions / Restrictions Precautions Precautions: Fall;Knee Precaution Booklet Issued: Yes (comment) Precaution Comments: HEP reviewed Restrictions Weight Bearing Restrictions: Yes LLE Weight Bearing: Weight bearing as tolerated    Mobility  Bed Mobility Overal bed mobility: +2 for physical assistance;Needs Assistance;+ 2 for safety/equipment Bed Mobility: Supine to Sit     Supine to sit: Total assist;+2 for physical assistance;+2 for safety/equipment;HOB elevated     General bed mobility comments: HOB slightly elevated; Pt. was able to hold herself up for only a few seconds at EOB; 1 therapist at feet, 1 therapist at trunk   Transfers Overall transfer level: Needs assistance Equipment used: Rolling walker (2 wheeled) Transfers: Sit to/from [employer] Sit to Stand: Max assist;+2 physical assistance;+2 safety/equipment Stand pivot transfers: Max assist;+2 physical assistance;+2 safety/equipment       General transfer comment: Verbal cues and encouragement for the pt. to assist with transfers. Manual assistance needed to move UEs from bed to walker. Heavy cues and physical  assist needed throughout transfers; Pt. was able to shift weight and take steps to turn with therapist's assistance to move feet forward; transferring from bed -> chair  Ambulation/Gait Ambulation/Gait assistance: Max assist;+2 physical assistance;+2 safety/equipment Ambulation Distance (Feet): 5 Feet (during transfer ) Assistive device: Rolling walker (2 wheeled) Gait Pattern/deviations: Step-to pattern;Shuffle;Trunk flexed Gait velocity: very decreased   General Gait Details: manual assist and encouragement needed to move feet forward and initiate gait from standing, verbal and max. physical assist for posture, safety, and control.        Balance Overall balance assessment: Needs assistance Sitting-balance support: Bilateral upper extremity supported;Feet supported Sitting balance-Leahy Scale: Zero Sitting balance - Comments: Pt. was able to maintain sitting posture on EOB for just a few seconds with bilateral arm support; therapist supported trunk otherwise   Standing balance support: Single extremity supported Standing balance-Leahy Scale: Zero Standing balance comment: Max. assist and RW needed to maintain balance                    Cognition Arousal/Alertness: Lethargic Behavior During Therapy: Flat affect Overall Cognitive Status: Difficult to assess Area of Impairment: Attention;Memory;Following commands;Problem solving;Safety/judgement   Current Attention Level: Sustained Memory: Decreased recall of precautions;Decreased short-term memory Following Commands: Follows one step commands inconsistently;Follows one step commands with increased time Safety/Judgement: Decreased awareness of safety;Decreased awareness of deficits Awareness: Emergent Problem Solving: Slow processing;Decreased initiation;Difficulty sequencing;Requires verbal cues;Requires tactile cues General Comments: Pt. would open eyes and respond after a few verbal cues; pt. was slightly more alert  throughout treatment had difficulty keeping eyes open when sitting in chair    Exercises Total Joint Exercises Ankle  Circles/Pumps: PROM;Left;Seated;AAROM;20 reps Heel Slides: PROM;Left;10 reps;Seated;AAROM Hip ABduction/ADduction: PROM;Left;10 reps;Seated;AAROM Straight Leg Raises: PROM;Left;10 reps;Seated;AAROM Goniometric ROM: 0-51 (long sitting in recliner - PROM)    General Comments General comments (skin integrity, edema, etc.): The pt. did well this treatment.  She reported no pain but continues to be very slow with movements and responses. She was being jovial with her family members during treatment and responding to their comments.  She still requires max. assist with transfers and ambulation but is more aware of what is going on.       Pertinent Vitals/Pain Pain Assessment: No/denies pain           PT Goals (current goals can now be found in the care plan section) Acute Rehab PT Goals Patient Stated Goal: none stated due to slight lethargy Progress towards PT goals: Progressing toward goals    Frequency  7X/week    PT Plan Current plan remains appropriate       End of Session Equipment Utilized During Treatment: Gait belt Activity Tolerance: Patient limited by lethargy;No increased pain Patient left: in chair;with call bell/phone within reach;with nursing/sitter in room     Time: 1018-1050 PT Time Calculation (min) (ACUTE ONLY): 32 min  Charges:  1 TA, 1 TE                  Nicole Baxter 09/25/2015, 11:16 AM

## 2015-09-25 NOTE — Progress Notes (Signed)
Patient's husband and daughter had questions regarding d/c today for the MD/ PA. This RN spoke with Tessa LernerLindsey Stanbery, PA by telephone - she stated she had talked with the Case Manager, will d/c tomorrow to SNF. PA requested this RN update d/c order and inform family.

## 2015-09-25 NOTE — Care Management Note (Signed)
Case Management Note  Patient Details  Name: Nicole NgoRuby L Baxter MRN: [id number] Date of Birth: 02/04/1941  Subjective/Objective:       S/p left total knee arthroplasty             Action/Plan: Set up with [facility] for HHPT by MD office. PT and OT recommending SNF. Referral made to CSW. Will continue to follow for d/c needs.  Expected Discharge Date:                  Expected Discharge Plan:  Skilled Nursing Facility  In-House Referral:  Clinical Social Work  Discharge planning Services  CM Consult  Post Acute Care Choice:    Choice offered to:     DME Arranged:    DME Agency:     HH Arranged:    HH Agency:     Status of Service:  In process, will continue to follow  Medicare Important Message Given:    Date Medicare IM Given:    Medicare IM give by:    Date Additional Medicare IM Given:    Additional Medicare Important Message give by:     If discussed at Long Length of Stay Meetings, dates discussed:    Additional Comments:  Monica BectonKrieg, Lejla Moeser Watson, RN 09/25/2015, 7:50 AM

## 2015-09-26 LAB — CBC
HCT: 31.6 % — ABNORMAL LOW (ref 36.0–46.0)
HEMOGLOBIN: 10.5 g/dL — AB (ref 12.0–15.0)
MCH: 31.1 pg (ref 26.0–34.0)
MCHC: 33.2 g/dL (ref 30.0–36.0)
MCV: 93.5 fL (ref 78.0–100.0)
PLATELETS: 173 10*3/uL (ref 150–400)
RBC: 3.38 MIL/uL — AB (ref 3.87–5.11)
RDW: 13.1 % (ref 11.5–15.5)
WBC: 7.7 10*3/uL (ref 4.0–10.5)

## 2015-09-26 LAB — BASIC METABOLIC PANEL
Anion gap: 12 (ref 5–15)
BUN: 14 mg/dL (ref 6–20)
CHLORIDE: 99 mmol/L — AB (ref 101–111)
CO2: 26 mmol/L (ref 22–32)
CREATININE: 0.84 mg/dL (ref 0.44–1.00)
Calcium: 8.7 mg/dL — ABNORMAL LOW (ref 8.9–10.3)
Glucose, Bld: 95 mg/dL (ref 65–99)
POTASSIUM: 3.6 mmol/L (ref 3.5–5.1)
SODIUM: 137 mmol/L (ref 135–145)

## 2015-09-26 LAB — GLUCOSE, CAPILLARY: GLUCOSE-CAPILLARY: 96 mg/dL (ref 65–99)

## 2015-09-26 NOTE — Clinical Social Work Note (Addendum)
[facility] to [facility]  Patient and family agreeable to plans will transport via ems RN to call report.  Daughter Rosey Batheresa has been notified.  Windell MouldingEric Zyion Leidner, MSW, Theresia MajorsLCSWA [phone]

## 2015-09-26 NOTE — Progress Notes (Signed)
[facility] Progress Note  Subjective  No specific complaints Doing well Ready to discharge to skilled nursing facility  No dysuria  Review of Systems  Constitutional: Negative for fever and chills.  Respiratory: Negative for shortness of breath and wheezing.   Cardiovascular: Negative for chest pain and palpitations.  Gastrointestinal: Negative for vomiting and abdominal pain.     Objective   BP 125/65 mmHg  Pulse 86  Temp(Src) 99.3 F (37.4 C) (Oral)  Resp 17  Ht 5' 1.5" (1.562 m)  Wt 68.72 kg (151 lb 8 oz)  BMI 28.17 kg/m2  SpO2 86%  Intake/Output      10/28 0701 - 10/29 0700 10/29 0701 - 10/30 0700   P.O. 120    Total Intake(mL/kg) 120 (1.7)    Net +120          Urine Occurrence 6 x 1 x     Labs  Results for Nicole Baxter, Nicole Baxter (MRN [id number]) as of 09/26/2015 10:31  Ref. Range 09/25/2015 09:10  Appearance Latest Ref Range: CLEAR  CLEAR  Bilirubin Urine Latest Ref Range: NEGATIVE  NEGATIVE  Color, Urine Latest Ref Range: YELLOW  YELLOW  Glucose Latest Ref Range: NEGATIVE mg/dL NEGATIVE  Hgb urine dipstick Latest Ref Range: NEGATIVE  NEGATIVE  Ketones, ur Latest Ref Range: NEGATIVE mg/dL NEGATIVE  Leukocytes, UA Latest Ref Range: NEGATIVE  NEGATIVE  Nitrite Latest Ref Range: NEGATIVE  NEGATIVE  pH Latest Ref Range: 5.0-8.0  5.5  Protein Latest Ref Range: NEGATIVE mg/dL NEGATIVE  Specific Gravity, Urine Latest Ref Range: 1.005-1.030  1.012  Urobilinogen, UA Latest Ref Range: 0.0-1.0 mg/dL 0.2   Results for Nicole Baxter, Nicole Baxter (MRN [id number]) as of 09/26/2015 10:31  Ref. Range 09/26/2015 05:42  Sodium Latest Ref Range: 135-145 mmol/L 137  Potassium Latest Ref Range: 3.5-5.1 mmol/L 3.6  Chloride Latest Ref Range: 101-111 mmol/L 99 (L)  CO2 Latest Ref Range: 22-32 mmol/L 26  BUN Latest Ref Range: 6-20 mg/dL 14  Creatinine Latest Ref Range: 0.44-1.00 mg/dL 0.84  Calcium Latest Ref Range: 8.9-10.3 mg/dL 8.7 (L)  EGFR (Non-African Amer.) Latest Ref  Range: >60 mL/min >60  EGFR (African American) Latest Ref Range: >60 mL/min >60  Glucose Latest Ref Range: 65-99 mg/dL 95  Anion gap Latest Ref Range: 5-15  12  WBC Latest Ref Range: 4.0-10.5 K/uL 7.7  RBC Latest Ref Range: 3.87-5.11 MIL/uL 3.38 (L)  Hemoglobin Latest Ref Range: 12.0-15.0 g/dL 10.5 (L)  HCT Latest Ref Range: 36.0-46.0 % 31.6 (L)  MCV Latest Ref Range: 78.0-100.0 fL 93.5  MCH Latest Ref Range: 26.0-34.0 pg 31.1  MCHC Latest Ref Range: 30.0-36.0 g/dL 33.2  RDW Latest Ref Range: 11.5-15.5 % 13.1  Platelets Latest Ref Range: 150-400 K/uL 173    Exam  Gen: Awake and alert, resting comfortably in bed Lungs: Clear anterior fields Cardiac: Regular rate and Rhythm, S1 and S2 Abd:+ Bowel sounds, nontender and nondistended Ext:       Left lower extremity  Incision looks fantastic  No erythema or signs of infection  Swelling controlled  Distal motor and sensory functions intact  Extremity is warm  + DP pulse  Compartments are soft and nontender  No deep calf tenderness   Assessment and Plan   POD/HD#: 92   74 year old female status post left total knee arthroplasty  1. 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 Percell Miller as noted on discharge paperwork- 1 week  Discharge to skilled nursing facility today    UA without concerning findings. Urine culture pending but low suspicion for UTI  2. Acute blood loss anemia  Stable and improved   3. Chronic Medical issues  Home medications  4. DVT and PE prophylaxis  apixaban   5. Disposition  Discharge to skilled nursing facility today   Quick review of patient's previous problems included vitamin D deficiency with a level of 9, last set of labs were obtained in January 2016. Placed order for vitamin D panel and follow-up. It is been shown that vitamin D levels greater than 45 can result in increase of muscle function as well as  decreased fall risk. This will need to be followed up as an outpatient. Did not note anywhere where patient is on vitamin D other than vitamin D with calcium and looks like she's getting a total of 800 IUs daily   Jari Pigg, PA-C [facility] [phone] (O) 09/26/2015 10:29 AM

## 2015-09-26 NOTE — Clinical Social Work Placement (Signed)
   CLINICAL SOCIAL WORK PLACEMENT  NOTE  Date:  09/26/2015  Patient Details  Name: Nicole Baxter MRN: [id number] Date of Birth: 01/18/1941  Clinical Social Work is seeking post-discharge placement for this patient at the Skilled  Nursing Facility level of care (*CSW will initial, date and re-position this form in  chart as items are completed):  Yes   Patient/family provided with [facility] Clinical Social Work Department's list of facilities offering this level of care within the geographic area requested by the patient (or if unable, by the patient's family).  Yes   Patient/family informed of their freedom to choose among providers that offer the needed level of care, that participate in Medicare, Medicaid or managed care program needed by the patient, have an available bed and are willing to accept the patient.  Yes   Patient/family informed of [facility]'s ownership interest in [facility] and [facility], as well as of the fact that they are under no obligation to receive care at these facilities.  PASRR submitted to EDS on 09/25/15     PASRR number received on 09/25/15     Existing PASRR number confirmed on       FL2 transmitted to all facilities in geographic area requested by pt/family on 09/25/15     FL2 transmitted to all facilities within larger geographic area on 09/25/15     Patient informed that his/her managed care company has contracts with or will negotiate with certain facilities, including the following:        Yes   Patient/family informed of bed offers received.  [facility]     Physician recommends and patient chooses bed at      [facility] on 09/26/15.  Patient to be transferred to facility by [facility]     Patient family notified on 09/26/15 of transfer.  Name of family member notified:  Rosey Batheresa, patient's daughter     PHYSICIAN       Additional Comment:     _______________________________________________ Darleene CleaverAnterhaus, Zanyia Silbaugh R, LCSWA 09/26/2015, 3:06 PM

## 2015-09-26 NOTE — Clinical Social Work Note (Signed)
Clinical Social Work Assessment  Patient Details  Name: Nicole Baxter MRN: [id number] Date of Birth: 07/11/1941  Date of referral:  09/25/15               Reason for consult:  Facility Placement                Permission sought to share information with:  Family Supports, Magazine features editoracility Contact Representative Permission granted to share information::  Yes, Verbal Permission Granted  Name::     Nicole Baxter patient's daughter, and Nicole Baxter patient's husband  Agency::  SNF admissions  Relationship::     Contact Information:     Housing/Transportation Living arrangements for the past 2 months:  Single Family Home Source of Information:  Adult Children, Spouse, Patient Patient Interpreter Needed:  None Criminal Activity/Legal Involvement Pertinent to Current Situation/Hospitalization:  No - Comment as needed Significant Relationships:  Adult Children, Spouse Lives with:  Spouse Do you feel safe going back to the place where you live?  Yes (Patient feels she needs some short term rehab in order to return back home.) Need for family participation in patient care:  Yes (Comment) (Patient has some confusion and also has Parkinson's  patient requests her daughter to help with decisions.)  Care giving concerns:  Patient's family are concerned that patient needs some short term rehab before she is able to return back home.  Social Worker assessment / plan:  Patient is a 74 year old female who lives with her husband.  Patient has Parkinsons and her husband has some medical issues of his own and expressed he can not take care of her right now until she gets her strength back up.  Patient asked questions about what to expect at a SNF placement, as did patient's family.  CSW explained process and how insurance pays for stay at SNF, patient's family are in agreement to having patient go to SNF for short term rehab.  Patient became agitated when she was first told about a SNF, but eventually agreed to it after speaking to  her family.  Patient and family's first choice was [facility], but there was not a bed available.  Patient and family [place]  Patient and family agreed to have [facility] go [facility] if there is a bed available.  Patient and family did not have any other questions.  Employment status:  Retired Database administratornsurance information:  Managed Medicare PT Recommendations:  Skilled Nursing Facility Information / Referral to community resources:  Skilled Nursing Facility  Patient/Family's Response to care:  Patient and family agreeable to going to SNF for short term rehab.  Patient/Family's Understanding of and Emotional Response to Diagnosis, Current Treatment, and Prognosis:  Patient not as aware of her diagnosis and current treatment but the family is agreeable to diagnosis and current treatment plan.  Emotional Assessment Appearance:  Appears stated age Attitude/Demeanor/Rapport:    Affect (typically observed):  Appropriate, Calm, Quiet Orientation:  Oriented to Self, Oriented to  Time, Oriented to Situation, Oriented to Place Alcohol / Substance use:  Not Applicable Psych involvement (Current and /or in the community):  No (Comment)  Discharge Needs  Concerns to be addressed:  Lack of Support Readmission within the last 30 days:  No Current discharge risk:  Lack of support system Barriers to Discharge:  No Barriers Identified   Nicole Baxter, Nicole Baxter, LCSWA 09/26/2015, 2:59 PM

## 2015-09-28 ENCOUNTER — Non-Acute Institutional Stay (SKILLED_NURSING_FACILITY): Payer: PPO | Admitting: Adult Health

## 2015-09-28 ENCOUNTER — Ambulatory Visit: Payer: PPO | Admitting: Neurology

## 2015-09-28 ENCOUNTER — Telehealth: Payer: Self-pay | Admitting: *Deleted

## 2015-09-28 DIAGNOSIS — I10 Essential (primary) hypertension: Secondary | ICD-10-CM | POA: Diagnosis not present

## 2015-09-28 DIAGNOSIS — M1712 Unilateral primary osteoarthritis, left knee: Secondary | ICD-10-CM

## 2015-09-28 DIAGNOSIS — G20 Parkinson's disease: Secondary | ICD-10-CM

## 2015-09-28 DIAGNOSIS — K5901 Slow transit constipation: Secondary | ICD-10-CM

## 2015-09-28 DIAGNOSIS — R2681 Unsteadiness on feet: Secondary | ICD-10-CM

## 2015-09-28 DIAGNOSIS — D62 Acute posthemorrhagic anemia: Secondary | ICD-10-CM

## 2015-09-28 NOTE — Telephone Encounter (Signed)
no showed f/u appointment  

## 2015-09-29 ENCOUNTER — Encounter: Payer: Self-pay | Admitting: Neurology

## 2015-09-29 LAB — URINE CULTURE

## 2015-09-29 LAB — CBC AND DIFFERENTIAL
HCT: 37 % (ref 36–46)
Hemoglobin: 11.8 g/dL — AB (ref 12.0–16.0)
Platelets: 325 10*3/uL (ref 150–399)
WBC: 10 10^3/mL

## 2015-09-29 LAB — BASIC METABOLIC PANEL
BUN: 28 mg/dL — AB (ref 4–21)
Creatinine: 0.9 mg/dL (ref 0.5–1.1)
GLUCOSE: 92 mg/dL
POTASSIUM: 4 mmol/L (ref 3.4–5.3)
SODIUM: 140 mmol/L (ref 137–147)

## 2015-09-30 ENCOUNTER — Telehealth: Payer: Self-pay | Admitting: *Deleted

## 2015-09-30 NOTE — Telephone Encounter (Signed)
Pt is at [facility] She is on cipro at the moment for uti Fax# [phone] Ph# [phone]

## 2015-10-02 ENCOUNTER — Encounter: Payer: Self-pay | Admitting: Internal Medicine

## 2015-10-02 ENCOUNTER — Encounter: Payer: Self-pay | Admitting: Adult Health

## 2015-10-02 ENCOUNTER — Non-Acute Institutional Stay (SKILLED_NURSING_FACILITY): Payer: PPO | Admitting: Internal Medicine

## 2015-10-02 DIAGNOSIS — I10 Essential (primary) hypertension: Secondary | ICD-10-CM

## 2015-10-02 DIAGNOSIS — G219 Secondary parkinsonism, unspecified: Secondary | ICD-10-CM

## 2015-10-02 DIAGNOSIS — M1712 Unilateral primary osteoarthritis, left knee: Secondary | ICD-10-CM | POA: Diagnosis not present

## 2015-10-02 DIAGNOSIS — D62 Acute posthemorrhagic anemia: Secondary | ICD-10-CM | POA: Diagnosis not present

## 2015-10-02 DIAGNOSIS — N3000 Acute cystitis without hematuria: Secondary | ICD-10-CM

## 2015-10-02 DIAGNOSIS — R2681 Unsteadiness on feet: Secondary | ICD-10-CM | POA: Diagnosis not present

## 2015-10-02 DIAGNOSIS — K5901 Slow transit constipation: Secondary | ICD-10-CM

## 2015-10-02 NOTE — Progress Notes (Signed)
Patient ID: Nicole Baxter, female   DOB: [birth year], 74 y.o.   MRN: [id number]     [facility]  PCP: Evelena Peat, DO  Code Status: Full Code   No Known Allergies  Chief Complaint  Patient presents with  . New Admit To SNF    New Admission      HPI:  74 y.o. patient is here for short term rehabilitation post hospital admission from 09/23/15-09/26/15 with left knee OA. She underwent left total knee arthroplasty. She is seen in her room today with her husband present. Her pain is under control with current regimen. She complaints of muscle spasm. No other concerns.   Review of Systems:  Constitutional: Negative for fever, chills, diaphoresis.  HENT: Negative for headache, congestion, nasal discharge. Eyes: Negative for eye pain, blurred vision, double vision and discharge.  Respiratory: Negative for cough, shortness of breath and wheezing.   Cardiovascular: Negative for chest pain, palpitations.  Gastrointestinal: Negative for heartburn, nausea, vomiting, abdominal pain. Genitourinary: Negative for dysuria and flank pain.  Musculoskeletal: Negative for back pain, falls. Skin: Negative for itching, rash.  Neurological: Negative for dizziness, tingling, focal weakness Psychiatric/Behavioral: Negative for depression.   Past Medical History  Diagnosis Date  . Obesity   . Hyperglycemia     isolated FBS 122, 95, 104 (Random 138)  . 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 Hyperlipidemia     on pravastatin  . Hypertension     controlled on 2 agents  . Parkinson's disease (tremor, stiffness, slow motion, unstable posture) (HCC)   . Tremor   . GERD (gastroesophageal reflux disease)     pt. states she no longer has GERD (09/11/15)  . Arthritis   . Shortness of breath dyspnea     pt. states that she no longer has SOB )09/11/15   Past Surgical History  Procedure Laterality Date  . Colonoscopy    . Cesarean section    . Total knee arthroplasty  Left 09/23/2015    Procedure: LEFT TOTAL KNEE ARTHROPLASTY;  Surgeon: Loreta Ave, MD;  Location: [facility];  Service: Orthopedics;  Laterality: Left;   Social History:   reports that she has never smoked. She has never used smokeless tobacco. She reports that she does not drink alcohol or use illicit drugs.  Family History  Problem Relation Age of Onset  . Heart disease Mother 26    died of MI @ 79  . Alcohol abuse Father   . Cancer Brother   . Colon cancer Neg Hx     Medications:   Medication List       This list is accurate as of: 10/02/15 12:13 PM.  Always use your most recent med list.               albuterol 108 (90 BASE) MCG/ACT inhaler  Commonly known as:  PROAIR HFA  Inhale 2 puffs into the lungs every 4 (four) hours as needed for wheezing.     apixaban 2.5 MG Tabs tablet  Commonly known as:  ELIQUIS  Take 1 tab po q12 hours x 14 days following surgery to prevent blood clots     bisacodyl 5 MG EC tablet  Commonly known as:  DULCOLAX  Take 1 tablet (5 mg total) by mouth daily as needed for moderate constipation.     Calcium Carbonate-Vitamin D 600-400 MG-UNIT tablet  Commonly known as:  CALCIUM 600/VITAMIN D  Take 2 tablets by  mouth daily.     carbidopa-levodopa 25-100 MG tablet  Commonly known as:  SINEMET  Take 1 tablet by mouth 3 (three) times daily. Take 4 hours apart.     ciprofloxacin 500 MG tablet  Commonly known as:  CIPRO  Take 500 mg by mouth 2 (two) times daily. X 10 days for UTI, end on 10/09/15     hydrochlorothiazide 12.5 MG tablet  Commonly known as:  HYDRODIURIL  Take 1 tablet (12.5 mg total) by mouth daily.     HYDROcodone-acetaminophen 5-325 MG tablet  Commonly known as:  NORCO  Take 1 tablet by mouth every 4 (four) hours as needed for moderate pain.     lisinopril 20 MG tablet  Commonly known as:  PRINIVIL,ZESTRIL  Take 1 tablet (20 mg total) by mouth daily.     ondansetron 4 MG tablet  Commonly known as:  ZOFRAN  Take 1 tablet  (4 mg total) by mouth every 8 (eight) hours as needed for nausea or vomiting.     polyethylene glycol packet  Commonly known as:  MIRALAX / GLYCOLAX  Take 17 g by mouth daily.     sennosides-docusate sodium 8.6-50 MG tablet  Commonly known as:  SENOKOT-S  Take 2 tablets by mouth 2 (two) times daily.         Physical Exam: Filed Vitals:   10/02/15 1206  BP: 102/68  Pulse: 96  Temp: 97.2 F (36.2 C)  TempSrc: Oral  Resp: 16  Height:  (1.549 m)  Weight: 146 lb 12.8 oz (66.588 kg)  SpO2: 98%    General- elderly female, well built, in no acute distress Head- normocephalic, atraumatic Nose- normal nasal mucosa Throat- moist mucus membrane Eyes- PERRLA, EOMI, no pallor, no icterus, no discharge, normal conjunctiva, normal sclera Neck- no cervical lymphadenopathy Cardiovascular- normal s1,s2, no murmur, palpable dorsalis pedis and radial pulses, trace left leg edema Respiratory- bilateral clear to auscultation, no wheeze, no rhonchi, no crackles, no use of accessory muscles Abdomen- bowel sounds present, soft, non tender Musculoskeletal- able to move all 4 extremities, limited left knee ROM Neurological- no focal deficit, alert and oriented to person, place and time Skin- warm and dry, left knee surgical incision with steri strips and healing well Psychiatry- normal mood and affect    Labs reviewed: Basic Metabolic Panel:  Recent Labs  16/10/96 0456 09/25/15 0419 09/26/15 0542 09/29/15  NA 134* 137 137 140  K 3.8 3.9 3.6 4.0  CL 103 103 99*  --   CO2 --   GLUCOSE 150* 113* 95  --   BUN 28*  CREATININE 0.80 1.00 0.84 0.9  CALCIUM 8.5* 8.9 8.7*  --    Liver Function Tests:  Recent Labs  03/02/15 1037 03/20/15 1616 09/11/15 1043  AST ALT 22 <8 8*  ALKPHOS 142* 119* 96  BILITOT 1.1 1.1 1.6*  PROT 7.5 7.1 7.7  ALBUMIN 3.8 3.6 3.6   No results for input(s): LIPASE, AMYLASE in the last 8760 hours. No results for input(s):  AMMONIA in the last 8760 hours. CBC:  Recent Labs  12/26/14 1020 01/28/15 1527 09/11/15 1043 09/24/15 0456 09/25/15 0419 09/26/15 0542 09/29/15  WBC 6.0 5.8 5.7 10.6* 9.1 7.7 10.0  NEUTROABS 2.8 2.7 3.2  --   --   --   --   HGB 12.7 13.1 13.5 11.3* 9.9* 10.5* 11.8*  HCT 37.3 39.1 41.0 33.8* 30.4* 31.6* 37  MCV 89.2  90.3 94.7 93.9 93.0 93.5  --   PLT 197 246 193 190 176 173 325   Cardiac Enzymes: No results for input(s): CKTOTAL, CKMB, CKMBINDEX, TROPONINI in the last 8760 hours. BNP: Invalid input(s): POCBNP CBG:  Recent Labs  09/25/15 1616 09/25/15 2131 09/26/15 0727  GLUCAP 116* 98 96    Assessment/Plan  Unsteady gait With left knee replacement surgery. Will have patient work with PT/OT as tolerated to regain strength and restore function.  Fall precautions are in place.  Left knee OA S/p left TKA. Will have her work with physical therapy and occupational therapy team to help with gait training and muscle strengthening exercises.fall precautions. Skin care. Encourage to be out of bed. Continue norco 5-325 mg q4h prn pain. Add robaxin 500 mg q12h prn for muscle spasm and monitor. Has f/u with dr Eulah Pontmurphy. Continue eliquis for dvt prophylaxis. Continue calcium with vitamin d  UTI Continue and complete course of ciprofloxacin 500 mg bid x 10 days, hydration encouraged  Blood loss anemia Post op, monitor h&h  constipation Slow transit, continue senna s 2 tab bid with miralax daily and monitor to encourage hydration  Parkinson's disease Continue sinemet 25-100 mg tid  HTN Stable bp, monitor. Continue hctz 12.5 mg daily, lisinopril 20 mg daily, monitor bp and bmp    Goals of care: short term rehabilitation   Labs/tests ordered: cbc, bmp  Family/ staff Communication: reviewed care plan with patient and nursing supervisor    Oneal GroutMAHIMA Rico Massar, MD  [facility] [phone] (Monday-Friday 8 am - 5 pm) [phone] (afterhours)

## 2015-10-14 ENCOUNTER — Ambulatory Visit (INDEPENDENT_AMBULATORY_CARE_PROVIDER_SITE_OTHER): Payer: PPO | Admitting: Internal Medicine

## 2015-10-14 VITALS — BP 117/44 | HR 100 | Temp 98.0°F | Ht 62.0 in | Wt 152.0 lb

## 2015-10-14 DIAGNOSIS — E119 Type 2 diabetes mellitus without complications: Secondary | ICD-10-CM | POA: Diagnosis not present

## 2015-10-14 DIAGNOSIS — M170 Bilateral primary osteoarthritis of knee: Secondary | ICD-10-CM | POA: Diagnosis not present

## 2015-10-14 DIAGNOSIS — R413 Other amnesia: Secondary | ICD-10-CM

## 2015-10-14 DIAGNOSIS — E038 Other specified hypothyroidism: Secondary | ICD-10-CM | POA: Diagnosis not present

## 2015-10-14 DIAGNOSIS — E0590 Thyrotoxicosis, unspecified without thyrotoxic crisis or storm: Secondary | ICD-10-CM

## 2015-10-14 LAB — POCT GLYCOSYLATED HEMOGLOBIN (HGB A1C): HEMOGLOBIN A1C: 5.6

## 2015-10-14 LAB — GLUCOSE, CAPILLARY: Glucose-Capillary: 81 mg/dL (ref 65–99)

## 2015-10-14 NOTE — Patient Instructions (Signed)
1. I am checking some labwork because of your memory problems.  I will call you with results.  If labs are normal, we will likely need to have you see Dr. Lucia GaskinsAhern.  2. Please take all medications as prescribed.    3. If you have worsening of your symptoms or new symptoms arise, please call the clinic [phone]), or go to the ER immediately if symptoms are severe.  Please come back to see me in 1 month.

## 2015-10-14 NOTE — Assessment & Plan Note (Addendum)
Assessment:  Patient's husband reports short term memory deficits and confusion since surgery on 10/26.  Her husband says she was agitated and confused in the hospital.  Patient went for short term rehab stay after discharge and was just discharged from the facility today but she could not remember being at the facility.  She admits to visual hallucinations (seeing people and bugs that are not there) but not experiencing this during my exam.  She is not experiencing REM sleep behavior problems.  She is oriented to person, place but not time (she knows it is Nov but tells me 1916 instead of 2016).  Her MMSE is 21/30 today c/w mild cognitive impairment.  Differential includes subacute delirium in the setting of recent hospitalization/rehab stay, hypothyroidism (she is 6 months s/p RAI trx for subclinical hyperthyroidism), Sinement ADR, early Parkinson's Disease Dementia (hallucinations, preceding Parkinsonism).  Less likely infection or electrolyte abnormality (labs 3 weeks ago look ok). Plan: - will check basic labs today to check for reversible causes (CMP, CBC, TSH, free T4, free T3, vitamin B12) - She has been using Vicodin q4h for post-op pain and I have asked her to decrease it in case it is contributing. - continue Sinemet but may need to consider med adjustments if symptoms persist - she is returning home with her husband who can provide 24 hour supervision (although she is not at risk for wandering off and does not seem to be a danger to herself) - RTC in 1 month - refer back to neurology (Dr. Lucia GaskinsAhern is managing her Parkinsonism) if above unrevealing

## 2015-10-14 NOTE — Assessment & Plan Note (Addendum)
Assessment:  Well healed surgical scar on left knee covered with steri strips and a bandage.  No erythema, tenderness or drainage.  Left knee and leg swollen compared to right but husband says the swelling has decreased since recent surgery.  No calf pain and patient was on Eliquis x 2 weeks for VTE ppx so DVT unlikely.  She has been receiving PT at rehab facility and will get outpatient PT now that she is going home.  She is scheduled for follow-up with surgeon in two days. Plan:  Follow-up with surgeon in two days.  She has taken Eliquis x 14 days for VTE ppx.  I will extend for another 14 days as I think her risk of clot is pretty high given her limited mobility 2/2 Parkinson's and now with increased confusion I don't know how her PT will proceed.  Rx sent for Eliquis 2.5mg  BID #28 no refills.

## 2015-10-14 NOTE — Assessment & Plan Note (Addendum)
Assessment:  She is having memory loss and confusion.   Plan:  TSH, free T4 and free T3

## 2015-10-14 NOTE — Progress Notes (Signed)
Subjective:    Patient ID: Nicole Baxter, female    DOB: 04/05/1941, 74 y.o.   MRN: [id number]  HPI Comments: Nicole Baxter is a 74 year old woman with PMH as below here for hospital follow-up.  Husband is also concerned that she has had memory problems since recent admission for left TKR.  Please see problem based charting for the status of this medical condition.     Past Medical History  Diagnosis Date  . Obesity   . Hyperglycemia     isolated FBS 122, 95, 104 (Random 138)  . 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 Hyperlipidemia     on pravastatin  . Hypertension     controlled on 2 agents  . Parkinson's disease (tremor, stiffness, slow motion, unstable posture) (HCC)   . Tremor   . GERD (gastroesophageal reflux disease)     pt. states she no longer has GERD (09/11/15)  . Arthritis   . Shortness of breath dyspnea     pt. states that she no longer has SOB )09/11/15   Current Outpatient Prescriptions on File Prior to Visit  Medication Sig Dispense Refill  . albuterol (PROAIR HFA) 108 (90 BASE) MCG/ACT inhaler Inhale 2 puffs into the lungs every 4 (four) hours as needed for wheezing. 3.7 g 1  . apixaban (ELIQUIS) 2.5 MG TABS tablet Take 1 tab po q12 hours x 14 days following surgery to prevent blood clots 28 tablet 0  . bisacodyl (DULCOLAX) 5 MG EC tablet Take 1 tablet (5 mg total) by mouth daily as needed for moderate constipation. 30 tablet 0  . Calcium Carbonate-Vitamin D (CALCIUM 600/VITAMIN D) 600-400 MG-UNIT tablet Take 2 tablets by mouth daily. 180 tablet 1  . carbidopa-levodopa (SINEMET) 25-100 MG per tablet Take 1 tablet by mouth 3 (three) times daily. Take 4 hours apart. 90 tablet 6  . ciprofloxacin (CIPRO) 500 MG tablet Take 500 mg by mouth 2 (two) times daily. X 10 days for UTI, end on 10/09/15    . hydrochlorothiazide (HYDRODIURIL) 12.5 MG tablet Take 1 tablet (12.5 mg total) by mouth daily. 90 tablet 1  . HYDROcodone-acetaminophen (NORCO) 5-325 MG tablet  Take 1 tablet by mouth every 4 (four) hours as needed for moderate pain. 60 tablet 0  . lisinopril (PRINIVIL,ZESTRIL) 20 MG tablet Take 1 tablet (20 mg total) by mouth daily. 90 tablet 1  . ondansetron (ZOFRAN) 4 MG tablet Take 1 tablet (4 mg total) by mouth every 8 (eight) hours as needed for nausea or vomiting. 40 tablet 0  . polyethylene glycol (MIRALAX / GLYCOLAX) packet Take 17 g by mouth daily.    . sennosides-docusate sodium (SENOKOT-S) 8.6-50 MG tablet Take 2 tablets by mouth 2 (two) times daily.     No current facility-administered medications on file prior to visit.    Review of Systems  Constitutional: Negative for fever, chills and appetite change.  HENT: Negative for trouble swallowing.   Respiratory: Negative for cough and shortness of breath.   Cardiovascular: Negative for chest pain, palpitations and leg swelling.  Gastrointestinal: Negative for nausea, vomiting, abdominal pain, diarrhea, constipation and blood in stool.  Endocrine: Negative for cold intolerance, heat intolerance, polydipsia, polyphagia and polyuria.  Genitourinary: Negative for difficulty urinating.  Psychiatric/Behavioral: Positive for hallucinations and dysphoric mood. Negative for suicidal ideas and sleep disturbance.       Seeing people and bugs that are not there.  No auditory hallucinations.  No REM sleep problems.  Filed Vitals:   10/14/15 1347  BP: 117/44  Pulse: 100  Temp: 98 F (36.7 C)  TempSrc: Oral  Height:  (1.575 m)  Weight: 152 lb (68.947 kg)  SpO2: 100%   Objective:   Physical Exam  Constitutional: She is oriented to person, place, and time. She appears well-developed. No distress.  HENT:  Head: Normocephalic and atraumatic.  Mouth/Throat: Oropharynx is clear and moist. No oropharyngeal exudate.  Eyes: EOM are normal. Pupils are equal, round, and reactive to light.  Neck: Neck supple.  Cardiovascular: Normal rate, regular rhythm and normal heart sounds.  Exam  reveals no gallop and no friction rub.   No murmur heard. Pulmonary/Chest: Effort normal and breath sounds normal. No respiratory distress. She has no wheezes. She has no rales.  Abdominal: Soft. Bowel sounds are normal. She exhibits no distension. There is no tenderness. There is no rebound.  Musculoskeletal: Normal range of motion. She exhibits edema. She exhibits no tenderness.  Left leg more swollen than right (but improved since surgery per patient and husband).  No calf tenderness.  Well healing surgical scar w/o redness/warmth/drainage.    Neurological: She is alert and oriented to person, place, and time. No cranial nerve deficit.  Rest tremor upper extremities.  Skin: Skin is warm. She is not diaphoretic.  Psychiatric: She has a normal mood and affect. Her behavior is normal. Judgment and thought content normal.  Vitals reviewed.         Assessment & Plan:  Please see problem based charting for assessment and plan.

## 2015-10-15 ENCOUNTER — Telehealth: Payer: Self-pay | Admitting: *Deleted

## 2015-10-15 LAB — CMP14 + ANION GAP
ALT: 5 IU/L (ref 0–32)
ANION GAP: 18 mmol/L (ref 10.0–18.0)
AST: 13 IU/L (ref 0–40)
Albumin/Globulin Ratio: 1.2 (ref 1.1–2.5)
Albumin: 4.1 g/dL (ref 3.5–4.8)
Alkaline Phosphatase: 117 IU/L (ref 39–117)
BUN/Creatinine Ratio: 14 (ref 11–26)
BUN: 12 mg/dL (ref 8–27)
Bilirubin Total: 0.6 mg/dL (ref 0.0–1.2)
CALCIUM: 10 mg/dL (ref 8.7–10.3)
CO2: 24 mmol/L (ref 18–29)
CREATININE: 0.87 mg/dL (ref 0.57–1.00)
Chloride: 99 mmol/L (ref 97–106)
GFR calc Af Amer: 77 mL/min/{1.73_m2} (ref >59)
GFR calc non Af Amer: 67 mL/min/{1.73_m2} (ref >59)
Globulin, Total: 3.5 g/dL (ref 1.5–4.5)
Glucose: 87 mg/dL (ref 65–99)
Potassium: 4.5 mmol/L (ref 3.5–5.2)
Sodium: 141 mmol/L (ref 136–144)
Total Protein: 7.6 g/dL (ref 6.0–8.5)

## 2015-10-15 LAB — T4, FREE: FREE T4: 1.02 ng/dL (ref 0.82–1.77)

## 2015-10-15 LAB — CBC
Hematocrit: 33.3 % — ABNORMAL LOW (ref 34.0–46.6)
Hemoglobin: 11 g/dL — ABNORMAL LOW (ref 11.1–15.9)
MCH: 30.2 pg (ref 26.6–33.0)
MCHC: 33 g/dL (ref 31.5–35.7)
MCV: 92 fL (ref 79–97)
PLATELETS: 266 10*3/uL (ref 150–379)
RBC: 3.64 x10E6/uL — AB (ref 3.77–5.28)
RDW: 14.1 % (ref 12.3–15.4)
WBC: 7.2 10*3/uL (ref 3.4–10.8)

## 2015-10-15 LAB — T3, FREE: T3 FREE: 2.9 pg/mL (ref 2.0–4.4)

## 2015-10-15 LAB — VITAMIN B12: Vitamin B-12: 185 pg/mL — ABNORMAL LOW (ref 211–946)

## 2015-10-15 LAB — TSH: TSH: 2.49 u[IU]/mL (ref 0.450–4.500)

## 2015-10-15 MED ORDER — APIXABAN 2.5 MG PO TABS: ORAL_TABLET | ORAL | Status: DC

## 2015-10-15 NOTE — Telephone Encounter (Signed)
Call to patient and husband about low Vit B 12 and the need to start injections.  Spoke with patient's husband about the results and that this may be contributing to her Memory loss.   Would also like for patient to return to Dr. Lucia GaskinsAhern.  Husband was asked to call to schedule the appointment of which he agreed to do.  Patient's husband was also told to continue Eliquis 2 times a day for the next 2 weeks.  Husband voiced understanding of and will bring patient in for injections.  Patient  Will come in on tomorrow as she has to come in with her son.  Angelina OkGladys  Ariellah Faust, RN 10/15/2015 4:46 PM

## 2015-10-16 ENCOUNTER — Ambulatory Visit (INDEPENDENT_AMBULATORY_CARE_PROVIDER_SITE_OTHER): Payer: PPO | Admitting: *Deleted

## 2015-10-16 ENCOUNTER — Other Ambulatory Visit: Payer: Self-pay | Admitting: Internal Medicine

## 2015-10-16 DIAGNOSIS — E538 Deficiency of other specified B group vitamins: Secondary | ICD-10-CM

## 2015-10-16 MED ORDER — CYANOCOBALAMIN 1000 MCG/ML IJ SOLN
1000.0000 ug | Freq: Once | INTRAMUSCULAR | Status: AC
Start: 2015-10-16 — End: 2015-10-16
  Administered 2015-10-16: 1000 ug via INTRAMUSCULAR

## 2015-10-16 MED ORDER — CYANOCOBALAMIN 1000 MCG/ML IJ SOLN: 1000.0000 ug | Freq: Once | INTRAMUSCULAR | Status: DC

## 2015-10-16 NOTE — Progress Notes (Signed)
Internal Medicine Clinic Attending  Case discussed with Dr. Wilson soon after the resident saw the patient.  We reviewed the resident's history and exam and pertinent patient test results.  I agree with the assessment, diagnosis, and plan of care documented in the resident's note.  

## 2015-10-19 ENCOUNTER — Ambulatory Visit (INDEPENDENT_AMBULATORY_CARE_PROVIDER_SITE_OTHER): Payer: PPO | Admitting: *Deleted

## 2015-10-19 DIAGNOSIS — E538 Deficiency of other specified B group vitamins: Secondary | ICD-10-CM | POA: Diagnosis not present

## 2015-10-19 MED ORDER — CYANOCOBALAMIN 1000 MCG/ML IJ SOLN
1000.0000 ug | Freq: Once | INTRAMUSCULAR | Status: AC
  Administered 2015-10-19: 1000 ug via INTRAMUSCULAR

## 2015-10-20 ENCOUNTER — Ambulatory Visit (INDEPENDENT_AMBULATORY_CARE_PROVIDER_SITE_OTHER): Payer: PPO | Admitting: *Deleted

## 2015-10-20 ENCOUNTER — Encounter: Payer: Self-pay | Admitting: Student

## 2015-10-20 DIAGNOSIS — E538 Deficiency of other specified B group vitamins: Secondary | ICD-10-CM | POA: Diagnosis not present

## 2015-10-20 MED ORDER — CYANOCOBALAMIN 1000 MCG/ML IJ SOLN
1000.0000 ug | Freq: Once | INTRAMUSCULAR | Status: AC
  Administered 2015-10-20: 1000 ug via INTRAMUSCULAR

## 2015-10-21 ENCOUNTER — Ambulatory Visit (INDEPENDENT_AMBULATORY_CARE_PROVIDER_SITE_OTHER): Payer: PPO | Admitting: *Deleted

## 2015-10-21 DIAGNOSIS — E538 Deficiency of other specified B group vitamins: Secondary | ICD-10-CM | POA: Diagnosis not present

## 2015-10-21 MED ORDER — CYANOCOBALAMIN 1000 MCG/ML IJ SOLN
1000.0000 ug | Freq: Once | INTRAMUSCULAR | Status: AC
  Administered 2015-10-21: 1000 ug via INTRAMUSCULAR

## 2015-10-26 ENCOUNTER — Ambulatory Visit: Payer: PPO

## 2015-10-28 ENCOUNTER — Ambulatory Visit (INDEPENDENT_AMBULATORY_CARE_PROVIDER_SITE_OTHER): Payer: PPO | Admitting: *Deleted

## 2015-10-28 ENCOUNTER — Encounter: Payer: PPO | Admitting: Internal Medicine

## 2015-10-28 DIAGNOSIS — R413 Other amnesia: Secondary | ICD-10-CM

## 2015-10-28 MED ORDER — CYANOCOBALAMIN 1000 MCG/ML IJ SOLN
1000.0000 ug | Freq: Once | INTRAMUSCULAR | Status: AC
  Administered 2015-10-28: 1000 ug via INTRAMUSCULAR

## 2015-10-29 ENCOUNTER — Ambulatory Visit (INDEPENDENT_AMBULATORY_CARE_PROVIDER_SITE_OTHER): Payer: PPO | Admitting: *Deleted

## 2015-10-29 DIAGNOSIS — R413 Other amnesia: Secondary | ICD-10-CM | POA: Diagnosis not present

## 2015-10-29 MED ORDER — CYANOCOBALAMIN 1000 MCG/ML IJ SOLN
1000.0000 ug | Freq: Once | INTRAMUSCULAR | Status: AC
  Administered 2015-10-29: 1000 ug via INTRAMUSCULAR

## 2015-10-30 ENCOUNTER — Other Ambulatory Visit: Payer: Self-pay | Admitting: Dietician

## 2015-10-30 ENCOUNTER — Ambulatory Visit (INDEPENDENT_AMBULATORY_CARE_PROVIDER_SITE_OTHER): Payer: PPO | Admitting: *Deleted

## 2015-10-30 DIAGNOSIS — R413 Other amnesia: Secondary | ICD-10-CM

## 2015-10-30 DIAGNOSIS — E119 Type 2 diabetes mellitus without complications: Secondary | ICD-10-CM | POA: Diagnosis not present

## 2015-10-30 LAB — HM DIABETES EYE EXAM

## 2015-10-30 MED ORDER — CYANOCOBALAMIN 1000 MCG/ML IJ SOLN
1000.0000 ug | Freq: Once | INTRAMUSCULAR | Status: AC
Start: 2015-10-30 — End: 2015-10-30
  Administered 2015-10-30: 1000 ug via INTRAMUSCULAR

## 2015-10-30 NOTE — Addendum Note (Signed)
Addended by: Baird CancerPLYLER, Jaedin Regina M on: 10/30/2015 03:45 PM   Modules accepted: Orders

## 2015-11-02 ENCOUNTER — Ambulatory Visit (INDEPENDENT_AMBULATORY_CARE_PROVIDER_SITE_OTHER): Payer: PPO | Admitting: *Deleted

## 2015-11-02 DIAGNOSIS — R251 Tremor, unspecified: Secondary | ICD-10-CM | POA: Diagnosis not present

## 2015-11-02 MED ORDER — CYANOCOBALAMIN 1000 MCG/ML IJ SOLN
1000.0000 ug | Freq: Once | INTRAMUSCULAR | Status: AC
  Administered 2015-11-02: 1000 ug via INTRAMUSCULAR

## 2015-11-03 ENCOUNTER — Ambulatory Visit (INDEPENDENT_AMBULATORY_CARE_PROVIDER_SITE_OTHER): Payer: PPO | Admitting: Neurology

## 2015-11-03 ENCOUNTER — Encounter: Payer: Self-pay | Admitting: Neurology

## 2015-11-03 VITALS — Ht 62.0 in | Wt 149.2 lb

## 2015-11-03 DIAGNOSIS — R413 Other amnesia: Secondary | ICD-10-CM

## 2015-11-03 DIAGNOSIS — G20 Parkinson's disease: Secondary | ICD-10-CM

## 2015-11-03 MED ORDER — ALPRAZOLAM 0.5 MG PO TABS: ORAL_TABLET | ORAL | Status: DC

## 2015-11-03 MED ORDER — CARBIDOPA-LEVODOPA 25-100 MG PO TABS: 1.0000 | ORAL_TABLET | Freq: Three times a day (TID) | ORAL | Status: DC

## 2015-11-03 MED ORDER — RIVASTIGMINE TARTRATE 3 MG PO CAPS: 3.0000 mg | ORAL_CAPSULE | Freq: Two times a day (BID) | ORAL | Status: DC

## 2015-11-03 NOTE — Progress Notes (Signed)
[facility]    Provider:  Dr Lucia Gaskins Referring Provider: Yolanda Manges, DO Primary Care Physician:  Evelena Peat, DO  CC: Parkinsonism  Interval History 11/03/2015: here for follow up of Parkinsonism. She says she ran out of the prescription a few days ago and she has not taken the Sinemet in a few days. It really helps when she takes it. When she takes the Sinemet, the tremors stop. No side effects from the Sinemet. She feels the Sinemet lasts until the next dose. She has memory loss. We had ordered an MRI of the brain and she tried to do it but got claustrophobic. We need to get an MRi of the brain due to parkinsonism and memory loss. Will order and give some Xanax before going in to an open MRI. She has a manual wheelchair, we tried to get her a power wheelchair but it was too expensive. She feels much improved since being discharged from the hospital and starting B12 injections. B12 was 185 and she is having injections. Last thyroid was normal. Will do a MoCA today and start Rivastigmine for memory loss. Denies any confusion or altered mentation recently. They take the sinemet at 9am then 3or 4pm then 9pm. Discussed should take it every 4 hours. 9am, 1pm and 5pm. Do not take it with protein. No falls. No problems swallowing, not choking on food. No dyskinesias.  Addendum 07/22/2015: Called to see how she is doing. She hasn't taken the Sinemet since yesterday. Not taking it three times a day as prescribed. Sounds like it is difficullt for patient to remember to take her medication. She does say when she takes the medication it helps.   Interval update 06/24/2015: They are taking 1/2 pill of the sinemet with breakfast. She forgot she is not supposed to take it with food. It helps the tremor. No side effects from the Sinemet. Will increase the sinemet to a whole pill three times a day. She did not take her sinemet this morning. Symptoms brain they're Sinemet with some the next time  they come, we can take it in clinic and evaluate response. But if the Sinemet appears to help with her tremor, and this confirms the diagnosis of Parkinson's. May consider adding Azilect concurrently.  Interval update 05/12/2014: She had the radioactive iodine treatment and tremor is much improved. She still has bilateral upper resting tremor. She says she is walking a little better but husband disagrees. Husband says her knee hurts and stays sore. Husband feels her voice volume is lower and he can't hear her. She shuffles. Her sense of smell is poor. She is off balance.   We'll start Sinemet low dose. They are to call me back in 2 weeks to discuss how patient is responding. Discussed common side effects including nausea and GI side effects, orthostatic hypotension, dizziness and increased risk of falls. They are to stop the medication if significant side effects occur.   04/14/2015: [name] is a 74 y.o. female here as a referral from Dr. Andrey Campanile for tremor. She has a past medical history of recently diagnosed hyperthyroidism. She is here with her husband who provides much of the information. She also has a past medical history of essential tremor, hypertension, asthma, hyperlipidemia, asthma, diabetes, osteoarthritis. No history of dopamine blocking agents. 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  She has a  daily tremor. She had a tremor in the past, similar tremor but it went away. It came back last year. She has good days and bad days with the tremor. Her right leg sometimes won't move. There are some days when it is better. She shuffles and is slow walking. She can't move when walking sometimes, she freezes. The tone of her voice is lower, husband says she used to talk much louder. She doesn't yell anymore. She denies decreased smell  sensation. She has constipation. Her hand writing is terrible. She is able to eat with her tremor. Tremor is better with movement. Worse at rest. Answer legs shake, feels like nervousness. Mother has a tremor. Cousins have tremors so they think it is a family issue. She is hyperthyroid and is going to [facility] for treatment. No FHx of PD. She uses a walker at baseline. Walking has worsened.  Reviewed notes, labs and imaging from outside physicians, which showed: Notes state the patient has been seen by neurology in the past however I cannot find a note in Epic. She follows with Dr. Andrey CampanileWilson who notes resting tremor, rigidity and bradykinesia concerning for Parkinson's disease. She was started on gabapentin 100 mg 3 times a day and referred to neurology. Notes state that years ago she was seen by neurologist and advised to take Benadryl for tremor. Dr. Andrey CampanileWilson advised her to stop due to risk of anticholinergic symptoms in the elderly. TSH decreased 0.132 weeks ago, liver function tests with elevated AST and a LT, BMP unremarkable  Review of Systems: Patient complains of symptoms per HPI as well as the following symptoms: joint pain, nack pain, neck pain, no CP, no SOB. Pertinent negatives per HPI. All others negative.   Social History   Social History  . Marital Status: Married    Spouse Name: N/A  . Number of Children: 5  . Years of Education: 12   Occupational History  . Not on file.   Social History Main Topics  . Smoking status: Never Smoker   . Smokeless tobacco: Never Used  . Alcohol Use: No  . Drug Use: No  . Sexual Activity: No   Other Topics Concern  . Not on file   Social History Narrative   Worked at [employer] 31 years. Married.Active, out and about q day.   5 children, 1 with sarcoidosis, 1 with COPD.   Caffeine use: Drinks a 6 pack of soda per week    Family History  Problem Relation Age of Onset  . Heart disease Mother 6376    died of MI @ 5476  . Alcohol abuse Father    . Cancer Brother   . Colon cancer Neg Hx     Past Medical History  Diagnosis Date  . Obesity   . Hyperglycemia     isolated FBS 122, 95, 104 (Random 138)  . 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 Hyperlipidemia     on pravastatin  . Hypertension     controlled on 2 agents  . Parkinson's disease (tremor, stiffness, slow motion, unstable posture) (HCC)   . Tremor   . GERD (gastroesophageal reflux disease)     pt. states she no longer has GERD (09/11/15)  . Arthritis   . Shortness of breath dyspnea     pt. states that she no longer has SOB )09/11/15    Past Surgical History  Procedure Laterality Date  . Colonoscopy    . Cesarean section    .  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 albuterol (PROAIR HFA) 108 (90 BASE) MCG/ACT inhaler Inhale 2 puffs into the lungs every 4 (four) hours as needed for wheezing. 3.7 g 1  . apixaban (ELIQUIS) 2.5 MG TABS tablet Take 1 tab po q12 hours for an additional 14 days to prevent blood clots. 28 tablet 0  . bisacodyl (DULCOLAX) 5 MG EC tablet Take 1 tablet (5 mg total) by mouth daily as needed for moderate constipation. 30 tablet 0  . Calcium Carbonate-Vitamin D (CALCIUM 600/VITAMIN D) 600-400 MG-UNIT tablet Take 2 tablets by mouth daily. 180 tablet 1  . carbidopa-levodopa (SINEMET) 25-100 MG per tablet Take 1 tablet by mouth 3 (three) times daily. Take 4 hours apart. 90 tablet 6  . hydrochlorothiazide (HYDRODIURIL) 12.5 MG tablet Take 1 tablet (12.5 mg total) by mouth daily. 90 tablet 1  . HYDROcodone-acetaminophen (NORCO) 5-325 MG tablet Take 1 tablet by mouth every 4 (four) hours as needed for moderate pain. 60 tablet 0  . lisinopril (PRINIVIL,ZESTRIL) 20 MG tablet Take 1 tablet (20 mg total) by mouth daily. 90 tablet 1    . ondansetron (ZOFRAN) 4 MG tablet Take 1 tablet (4 mg total) by mouth every 8 (eight) hours as needed for nausea or vomiting. 40 tablet 0  . polyethylene glycol (MIRALAX / GLYCOLAX) packet Take 17 g by mouth daily.    . sennosides-docusate sodium (SENOKOT-S) 8.6-50 MG tablet Take 2 tablets by mouth 2 (two) times daily.     No current facility-administered medications for this visit.    Allergies as of 11/03/2015  . (No Known Allergies)    Vitals: There were no vitals taken for this visit. Last Weight:  Wt Readings from Last 1 Encounters:  10/14/15 152 lb (68.947 kg)   Last Height:   Ht Readings from Last 1 Encounters:  10/14/15 5\' 2"  (1.575 m)   Montreal Cognitive Assessment  11/03/2015  Visuospatial/ Executive (0/5) 1  Naming (0/3) 3  Attention: Read list of digits (0/2) 1  Attention: Read list of letters (0/1) 1  Attention: Serial 7 subtraction starting at 100 (0/3) 0  Language: Repeat phrase (0/2) 0  Language : Fluency (0/1) 1  Abstraction (0/2) 1  Delayed Recall (0/5) 0  Orientation (0/6) 6  Total 14  Adjusted Score (based on education) 15     Coordination: bradykinesia and dec. amplitude  on finger taps  Gait: freezing, shuffling, en-bloc turning. Decreased arm swing, re-emergent tremor. Bradykinesia.     Motor Observation:  Resting tremor in the bilateral upper extremities.  Tone:  Increased tone in the upper extremities  Posture:  stooped   Strength:  Strength is intact in the upper and lower limbs.    Sensation: intact to LT      Assessment/Plan: Nicole Baxter is a 74 y.o. female here as a referral from Dr. Andrey Campanile for tremor. She has a past medical history of recently diagnosed hyperthyroidism(treated, last tsh wnl), essential tremor, hypertension, asthma, hyperlipidemia, diabetes, osteoarthritis. No history of dopamine blocking agents. Recent follow up with internal medicine documented either nodular Graves' disease or toxic  multinodular goiter and she is s/p radioactive iodine treatment which has improved her tremors.  Needs an MRI of the brain for parkinsonism and memory loss. Alprazolam for MRI.  Patient does report  freezing, shuffling of gait, resting tremor, hypophonia, impaired smell. Exam does show bradykinesia on finger taps and walking, freezing, en-bloc turning, increased tone in the upper extremities and resting tremor in upper extremities.   Will continue Sinemet to 1 pill 3x a day. Asked husband to pay attention when she takes the pill and see if tremor improves, how much it improves, and when the Sinemet starts wearing off. Watch for side effects. Take it 4 hours apart such as 9am, 1pm and 5pm. Do not take with protein, take 30 minutes before or an hour after eating protein.   She is in physical therapy currently. Taking the IM B12 injections. Her thyroid appears to be normalized, had hyperthyroidism.   Memory loss - MoCA 15/30. Start Rivastigmine  twice daily. Can increase to  twice daily if tolerated. Discussed side effects: Nausea, vomiting, diarrhea, loss of appetite/weight loss, dizziness, drowsiness, weakness, trouble sleeping, shakiness (tremor), or muscle cramps. More serious side effects can include AV block, bradycardia, syncope, seizures, GI bleeding, rash.  Can start Namenda at a later time.  Needs follow up in 3 months with me.  Discussed parkinson's disease vs Parkinsonism, what it is and the symptoms. Discussed that Parkinson's disease is a neurodegenerative disease which involves dopamine in the brain. Progression is variable. People with Parkinson's can display tremor, shuffling, stooped posture, impaired balance and falls, lowered voice volume, constipation, decreased smell sensation. Risk of falls is especially problematic and vigilance as needed. Patient should always use her walker. Provided patient handout on Parkinson's disorder, online organizations as well as Parkinson's  support group that meets here in [place] with contact information.    Naomie Dean, MD  [facility] [address] [place], [place] [address]  Phone [phone] Fax [phone]  A total of 25 minutes was spent face-to-face with this patient. Over half this time was spent on counseling patient on the Parkinsonism diagnosis and different diagnostic and therapeutic options available.

## 2015-11-03 NOTE — Patient Instructions (Addendum)
Remember to drink plenty of fluid, eat healthy meals and do not skip any meals. Try to eat protein with a every meal and eat a healthy snack such as fruit or nuts in between meals. Try to keep a regular sleep-wake schedule and try to exercise daily, particularly in the form of walking, 20-30 minutes a day, if you can.   As far as your medications are concerned, I would like to suggest: -Will continue Sinemet to 1 pill 3x a day.  Take it 4 hours apart such as 9am, 1pm and 5pm. Do not take with protein, take 30 minutes before or an hour after eating protein.  - Start Rivastigmine twice daily for memory loss  As far as diagnostic testing: MRI of the brain  I would like to see you back in 3 months, sooner if we need to. Please call us with any interim questions, concerns, problems, updates or refill requests.   Our phone number is [phone]. We also have an after hours call service for urgent matters and there is a physician on-call for urgent questions. For any emergencies you know to call 911 or go to the nearest emergency room

## 2015-11-09 ENCOUNTER — Ambulatory Visit (INDEPENDENT_AMBULATORY_CARE_PROVIDER_SITE_OTHER): Payer: PPO

## 2015-11-09 ENCOUNTER — Ambulatory Visit (INDEPENDENT_AMBULATORY_CARE_PROVIDER_SITE_OTHER): Payer: Self-pay

## 2015-11-09 DIAGNOSIS — Z0000 Encounter for general adult medical examination without abnormal findings: Secondary | ICD-10-CM

## 2015-11-09 DIAGNOSIS — E538 Deficiency of other specified B group vitamins: Secondary | ICD-10-CM

## 2015-11-09 DIAGNOSIS — G20C Parkinsonism, unspecified: Secondary | ICD-10-CM

## 2015-11-09 DIAGNOSIS — R413 Other amnesia: Secondary | ICD-10-CM | POA: Diagnosis not present

## 2015-11-09 DIAGNOSIS — Z0289 Encounter for other administrative examinations: Secondary | ICD-10-CM

## 2015-11-09 DIAGNOSIS — G20 Parkinson's disease: Secondary | ICD-10-CM | POA: Diagnosis not present

## 2015-11-09 MED ORDER — CYANOCOBALAMIN 1000 MCG/ML IJ SOLN
1000.0000 ug | Freq: Once | INTRAMUSCULAR | Status: AC
  Administered 2015-11-09: 1000 ug via INTRAMUSCULAR

## 2015-11-10 ENCOUNTER — Telehealth: Payer: Self-pay | Admitting: *Deleted

## 2015-11-10 NOTE — Telephone Encounter (Signed)
-----   Message from Anson FretAntonia B Ahern, MD sent at 11/10/2015 11:21 AM EST ----- MRI of the brin was largely unremarkable except for some atrophy. No strokes, no masses, no lesions. Thanks

## 2015-11-10 NOTE — Telephone Encounter (Signed)
LVM for pt to call back about results. Gave [facility] phone number.  

## 2015-11-11 ENCOUNTER — Encounter: Payer: Self-pay | Admitting: Dietician

## 2015-11-11 ENCOUNTER — Other Ambulatory Visit: Payer: Self-pay | Admitting: Internal Medicine

## 2015-11-11 DIAGNOSIS — H3530 Unspecified macular degeneration: Secondary | ICD-10-CM

## 2015-11-12 NOTE — Telephone Encounter (Signed)
LVM for pt to call about results. Gave [facility] phone number.  

## 2015-11-16 ENCOUNTER — Telehealth: Payer: Self-pay

## 2015-11-16 ENCOUNTER — Ambulatory Visit (INDEPENDENT_AMBULATORY_CARE_PROVIDER_SITE_OTHER): Payer: PPO

## 2015-11-16 DIAGNOSIS — E538 Deficiency of other specified B group vitamins: Secondary | ICD-10-CM

## 2015-11-16 DIAGNOSIS — R413 Other amnesia: Secondary | ICD-10-CM

## 2015-11-16 MED ORDER — CYANOCOBALAMIN 1000 MCG/ML IJ SOLN
1000.0000 ug | Freq: Once | INTRAMUSCULAR | Status: AC
  Administered 2015-11-16: 1000 ug via INTRAMUSCULAR

## 2015-11-16 NOTE — Telephone Encounter (Signed)
Pt requesting results of her MRI brian.

## 2015-11-16 NOTE — Telephone Encounter (Signed)
It looks like Dr. Trevor MaceAhern's office attempted to call her regarding her MRI results.  She can still call them back if she would like but it looks like the MRI was okay except for some atrophy.  There are no acute findings.

## 2015-11-17 ENCOUNTER — Encounter: Payer: Self-pay | Admitting: *Deleted

## 2015-11-17 NOTE — Telephone Encounter (Signed)
Spoke with patient to advise MRI results per Dr. Andrey CampanileWilson.  Also informed of referral to opthalmology after her retinal scan per Dr. Andrey CampanileWilson. Pt OK

## 2015-11-17 NOTE — Telephone Encounter (Signed)
Sent letter to pt regarding results. 

## 2015-11-24 ENCOUNTER — Encounter: Payer: Self-pay | Admitting: Pharmacist

## 2015-11-24 ENCOUNTER — Ambulatory Visit (INDEPENDENT_AMBULATORY_CARE_PROVIDER_SITE_OTHER): Payer: PPO | Admitting: Pharmacist

## 2015-11-24 ENCOUNTER — Ambulatory Visit (INDEPENDENT_AMBULATORY_CARE_PROVIDER_SITE_OTHER): Payer: PPO

## 2015-11-24 DIAGNOSIS — E785 Hyperlipidemia, unspecified: Secondary | ICD-10-CM

## 2015-11-24 DIAGNOSIS — Z79899 Other long term (current) drug therapy: Secondary | ICD-10-CM | POA: Diagnosis not present

## 2015-11-24 DIAGNOSIS — G20 Parkinson's disease: Secondary | ICD-10-CM

## 2015-11-24 DIAGNOSIS — Z719 Counseling, unspecified: Secondary | ICD-10-CM

## 2015-11-24 DIAGNOSIS — Z0000 Encounter for general adult medical examination without abnormal findings: Secondary | ICD-10-CM

## 2015-11-24 MED ORDER — CYANOCOBALAMIN 1000 MCG/ML IJ SOLN
1000.0000 ug | Freq: Once | INTRAMUSCULAR | Status: AC
  Administered 2015-11-24: 1000 ug via INTRAMUSCULAR

## 2015-11-24 MED ORDER — ATORVASTATIN CALCIUM 40 MG PO TABS: 40.0000 mg | ORAL_TABLET | Freq: Every day | ORAL | Status: DC

## 2015-11-24 NOTE — Assessment & Plan Note (Signed)
History of statin non-adherence. Although in the past patient has reported concerns regarding side effects of statins, today patient and husband state they do not recall history of statin therapy. Controlled T2DM, 10-year ASCVD risk 28.8%. Discussed with PCP and care plan approved to re-start atorvastatin 40 mg daily. Educated patient/husband as appropriate and advised to follow up if any side effects occur or if any changes in condition or questions regarding medications arise.

## 2015-11-24 NOTE — Patient Instructions (Signed)
Patient educated about medication as defined in this encounter and verbalized understanding by repeating back instructions provided.   

## 2015-11-24 NOTE — Progress Notes (Signed)
S: Nicole Baxter is a 74 y.o. female reports to clinical pharmacist appointment for medication review. Patient did  bring medication bottles. Patient is accompanied by her husband, who assists at home with medication management.  No Known Allergies  Past Medical History  Diagnosis Date  . Obesity   . Hyperglycemia     isolated FBS 122, 95, 104 (Random 138)  . 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 Hyperlipidemia     on pravastatin  . Hypertension     controlled on 2 agents  . Parkinson's disease (tremor, stiffness, slow motion, unstable posture) (HCC)   . Tremor   . GERD (gastroesophageal reflux disease)     pt. states she no longer has GERD (09/11/15)  . Arthritis   . Shortness of breath dyspnea     pt. states that she no longer has SOB )09/11/15   Social History   Social History  . Marital Status: Married    Spouse Name: N/A  . Number of Children: 5  . Years of Education: 4   Social History Main Topics  . Smoking status: Never Smoker   . Smokeless tobacco: Never Used  . Alcohol Use: No  . Drug Use: No  . Sexual Activity: No   Other Topics Concern  . None   Social History Narrative   Worked at [employer] 31 years. Married.Active, out and about q day.   5 children, 1 with sarcoidosis, 1 with COPD.   Caffeine use: Drinks a 6 pack of soda per week   Family History  Problem Relation Age of Onset  . Heart disease Mother 51    died of MI @ 37  . Alcohol abuse Father   . Cancer Brother   . Colon cancer Neg Hx    O:    Component Value Date/Time   CHOL 222* 12/24/2014 0949   HDL 75 12/24/2014 0949   TRIG 66 12/24/2014 0949   LDL 134 12/24/2014 0949   AST 13 10/14/2015 1453   ALT 5 10/14/2015 1453   NA 141 10/14/2015 1453   NA 137 09/26/2015 0542   K 4.5 10/14/2015 1453   CL 99 10/14/2015 1453   CO2 24 10/14/2015 1453   GLUCOSE 87 10/14/2015 1453   GLUCOSE 95 09/26/2015 0542   HGBA1C 5.6 10/14/2015 1538   HGBA1C 7.3 09/28/2010 1030   BUN  12 10/14/2015 1453   BUN 14 09/26/2015 0542   CREATININE 0.87 10/14/2015 1453   CREATININE 0.9 09/29/2015   CREATININE 0.73 03/02/2015 1037   CALCIUM 10.0 10/14/2015 1453   GFRAA 77 10/14/2015 1453   GFRAA >89 03/02/2015 1037   WBC 7.2 10/14/2015 1453   WBC 7.7 09/26/2015 0542   HGB 11.8* 09/29/2015   HCT 33.3* 10/14/2015 1453   HCT 37 09/29/2015   PLT 325 09/29/2015   TSH 2.490 10/14/2015 1453   Ht Readings from Last 2 Encounters:  11/03/15  (1.575 m)  10/14/15  (1.575 m)   Wt Readings from Last 2 Encounters:  11/03/15 149 lb 3.2 oz (67.677 kg)  10/14/15 152 lb (68.947 kg)   There is no weight on file to calculate BMI. BP Readings from Last 3 Encounters:  10/14/15 117/44  10/02/15 102/68  09/28/15 134/75    A/P: A medication review was performed and all medications were reviewed with the patient and husband, including name, instructions, indication, goals of therapy, potential side effects, importance of adherence, and safe use.  Findings/Recommendations:   Patient brought bottles of tramadol, methocarbamol, ondansetron, and rivaroxaban (empty) which were prescribed postoperatively in October. Advised patient to d/c and dispose of these. Patient did not have bottle of alprazolam. Medication list was updated according to findings. If needed in the future, can consider baclofen for muscle spasm.  Patient brought a bottle of OTC acetaminophen 500 mg and reports taking 1-2 tablets daily PRN pain. Provided education on safe use of acetaminophen for pain.  Patient requested easy open caps for her medications, pharmacy notified. Also provided patient with a pill box.  Dyslipidemia with history of statin non-adherence. Although in the past patient has reported concerns regarding side effects of statins, today patient and husband state they do not recall history of statin therapy. Controlled T2DM, 10-year ASCVD risk 28.8%. Discussed with PCP and care plan approved to  re-start atorvastatin 40 mg daily. Educated patient/husband as appropriate and advised to follow up if any side effects occur or if any changes in condition or questions regarding medications arise.   Patient is due for PCP follow up at which time we can evaluate statin tolerance/efficacy---will notify staff for scheduling.   An after visit summary was provided. The patient and family verbalized understanding of information provided by repeating back concepts discussed.   30 minutes spent face-to-face with the patient during the encounter. 50% of time spent on education. 50% of time was spent on assessment, plan, care coordination.

## 2015-11-25 NOTE — Progress Notes (Signed)
I have reviewed Dr. Elmyra RicksKim's medication management note.

## 2015-12-02 ENCOUNTER — Encounter: Payer: Self-pay | Admitting: *Deleted

## 2015-12-02 NOTE — Progress Notes (Signed)
Pt did not pick up printed rx xanax in office dated 05-07-2015. Shredded.

## 2015-12-03 ENCOUNTER — Other Ambulatory Visit: Payer: Self-pay | Admitting: Internal Medicine

## 2015-12-03 ENCOUNTER — Telehealth: Payer: Self-pay | Admitting: *Deleted

## 2015-12-03 ENCOUNTER — Other Ambulatory Visit (INDEPENDENT_AMBULATORY_CARE_PROVIDER_SITE_OTHER): Payer: PPO

## 2015-12-03 DIAGNOSIS — E538 Deficiency of other specified B group vitamins: Secondary | ICD-10-CM

## 2015-12-03 NOTE — Telephone Encounter (Signed)
Call to patient spoke with patient's husband [name](Larry) about need for Vit B 12 lab to see if Vit B 12 injections are improving the level per order of Dr. Andrey CampanileWilson.   Patient's husband was also told that patient will need to start taking Vit B 12 OTC.  Patients husband voiced understanding of the plan.  Will bring patient into for blood draw.  Appointment scheduled for today.  Angelina OkGladys Liane Tribbey, RN 12/03/2015 10:12 AM

## 2015-12-04 LAB — CBC
Hematocrit: 38.2 % (ref 34.0–46.6)
Hemoglobin: 12.2 g/dL (ref 11.1–15.9)
MCH: 28.6 pg (ref 26.6–33.0)
MCHC: 31.9 g/dL (ref 31.5–35.7)
MCV: 90 fL (ref 79–97)
Platelets: 196 10*3/uL (ref 150–379)
RBC: 4.26 x10E6/uL (ref 3.77–5.28)
RDW: 13.8 % (ref 12.3–15.4)
WBC: 5.4 10*3/uL (ref 3.4–10.8)

## 2015-12-04 LAB — VITAMIN B12: Vitamin B-12: 923 pg/mL (ref 211–946)

## 2015-12-10 ENCOUNTER — Telehealth: Payer: Self-pay | Admitting: *Deleted

## 2015-12-10 NOTE — Telephone Encounter (Signed)
CALLED BOTH PHONE NUMBERS LISTED FOR THE PATIENT AND LEFT VOICE MESSAGES ON BOTH. PATIENT NEEDS TO CALL DR MATTHEW OFFICE TO SET UP APPOINTMENT. OFFICE HAS BEEN TRYING TO GET IN TOUCH WITH THIS PATIENT.

## 2015-12-15 ENCOUNTER — Telehealth: Payer: Self-pay | Admitting: *Deleted

## 2015-12-15 DIAGNOSIS — Z96652 Presence of left artificial knee joint: Secondary | ICD-10-CM | POA: Diagnosis not present

## 2015-12-15 NOTE — Telephone Encounter (Signed)
Call to patient messages left on both Home number and Cell to call the Clinics concerning her Vitamin B 12.  Patients levels per Dr Andrey Campanile are normal and patient needs to continue taking the OTC Vitamin B 12.  Angelina Ok, RN 12/15/2015 11:35 AM

## 2015-12-16 NOTE — Telephone Encounter (Signed)
RTC from and to patient to inform her per Dr. Andrey Campanile that her Vit B 12 was normal and to continue taking her OTC Vit B 12 and to keep her upcoming appointment January 20th.  Patient voiced an understanding of the plan.  Angelina Ok, RN 12/16/2015 10:30 AM .

## 2015-12-18 ENCOUNTER — Encounter: Payer: Self-pay | Admitting: *Deleted

## 2015-12-18 ENCOUNTER — Encounter: Payer: Self-pay | Admitting: Internal Medicine

## 2015-12-18 ENCOUNTER — Other Ambulatory Visit: Payer: Self-pay | Admitting: Internal Medicine

## 2015-12-18 ENCOUNTER — Ambulatory Visit (INDEPENDENT_AMBULATORY_CARE_PROVIDER_SITE_OTHER): Payer: PPO | Admitting: Internal Medicine

## 2015-12-18 ENCOUNTER — Ambulatory Visit (HOSPITAL_COMMUNITY)
Admission: RE | Admit: 2015-12-18 | Discharge: 2015-12-18 | Disposition: A | Discharge status: Home / Self Care | Payer: PPO | Source: Ambulatory Visit | Attending: Student in an Organized Health Care Education/Training Program | Admitting: Student in an Organized Health Care Education/Training Program

## 2015-12-18 VITALS — BP 104/60 | HR 90 | Temp 98.6°F | Wt 149.9 lb

## 2015-12-18 DIAGNOSIS — J4520 Mild intermittent asthma, uncomplicated: Secondary | ICD-10-CM

## 2015-12-18 DIAGNOSIS — M7989 Other specified soft tissue disorders: Secondary | ICD-10-CM | POA: Diagnosis not present

## 2015-12-18 DIAGNOSIS — E0590 Thyrotoxicosis, unspecified without thyrotoxic crisis or storm: Secondary | ICD-10-CM | POA: Diagnosis not present

## 2015-12-18 DIAGNOSIS — Z0000 Encounter for general adult medical examination without abnormal findings: Secondary | ICD-10-CM

## 2015-12-18 DIAGNOSIS — Z1239 Encounter for other screening for malignant neoplasm of breast: Secondary | ICD-10-CM

## 2015-12-18 DIAGNOSIS — R413 Other amnesia: Secondary | ICD-10-CM

## 2015-12-18 DIAGNOSIS — Z7951 Long term (current) use of inhaled steroids: Secondary | ICD-10-CM | POA: Diagnosis not present

## 2015-12-18 DIAGNOSIS — M79605 Pain in left leg: Secondary | ICD-10-CM | POA: Diagnosis not present

## 2015-12-18 DIAGNOSIS — I10 Essential (primary) hypertension: Secondary | ICD-10-CM

## 2015-12-18 DIAGNOSIS — G20 Parkinson's disease: Secondary | ICD-10-CM

## 2015-12-18 DIAGNOSIS — J45909 Unspecified asthma, uncomplicated: Secondary | ICD-10-CM | POA: Diagnosis not present

## 2015-12-18 DIAGNOSIS — M170 Bilateral primary osteoarthritis of knee: Secondary | ICD-10-CM

## 2015-12-18 DIAGNOSIS — Z96652 Presence of left artificial knee joint: Secondary | ICD-10-CM

## 2015-12-18 DIAGNOSIS — I82402 Acute embolism and thrombosis of unspecified deep veins of left lower extremity: Secondary | ICD-10-CM | POA: Insufficient documentation

## 2015-12-18 DIAGNOSIS — R0601 Orthopnea: Secondary | ICD-10-CM

## 2015-12-18 MED ORDER — APIXABAN 5 MG PO TABS: 5.0000 mg | ORAL_TABLET | Freq: Two times a day (BID) | ORAL | Status: DC

## 2015-12-18 MED ORDER — APIXABAN 5 MG PO TABS: 10.0000 mg | ORAL_TABLET | Freq: Two times a day (BID) | ORAL | Status: DC

## 2015-12-18 NOTE — Progress Notes (Signed)
Subjective:    Patient ID: Nicole Baxter, female    DOB: [birth year], 75 y.o.   MRN: [id number]  HPI Comments: Nicole Baxter is a 75 year old woman with PMH as below here for follow-up of her memory loss. Please see problem based charting for status of her chronic medical conditions.  Shortness of Breath This is a recurrent problem. The current episode started more than 1 month ago. Episode frequency: on cold days. The problem has been unchanged. Associated symptoms include wheezing. Pertinent negatives include no abdominal pain, chest pain, fever, headaches, leg swelling, rhinorrhea, sore throat or vomiting. The symptoms are aggravated by weather changes (cold air). She has tried beta agonist inhalers for the symptoms. The treatment provided significant relief. Her past medical history is significant for asthma. There is no history of allergies, CAD, COPD, DVT, a heart failure or PE.     Past Medical History  Diagnosis Date  . Obesity   . Hyperglycemia     isolated FBS 122, 95, 104 (Random 138)  . 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 Hyperlipidemia     on pravastatin  . Hypertension     controlled on 2 agents  . Parkinson's disease (tremor, stiffness, slow motion, unstable posture) (HCC)   . Tremor   . GERD (gastroesophageal reflux disease)     pt. states she no longer has GERD (09/11/15)  . Arthritis   . Shortness of breath dyspnea     pt. states that she no longer has SOB )09/11/15   Current Outpatient Prescriptions on File Prior to Visit  Medication Sig Dispense Refill  . acetaminophen (TYLENOL) 500 MG tablet Take 500 mg by mouth every 6 (six) hours as needed.    Marland Kitchen albuterol (PROAIR HFA) 108 (90 BASE) MCG/ACT inhaler Inhale 2 puffs into the lungs every 4 (four) hours as needed for wheezing. 3.7 g 1  . atorvastatin (LIPITOR) 40 MG tablet Take 1 tablet (40 mg total) by mouth daily. Patient requested easy open caps for all medication bottles. 30 tablet 3  . bisacodyl  (DULCOLAX) 5 MG EC tablet Take 1 tablet (5 mg total) by mouth daily as needed for moderate constipation. 30 tablet 0  . Calcium Carbonate-Vitamin D (CALCIUM 600/VITAMIN D) 600-400 MG-UNIT tablet Take 2 tablets by mouth daily. 180 tablet 1  . carbidopa-levodopa (SINEMET) 25-100 MG tablet Take 1 tablet by mouth 3 (three) times daily. Take 4 hours apart. 270 tablet 4  . hydrochlorothiazide (HYDRODIURIL) 12.5 MG tablet Take 1 tablet (12.5 mg total) by mouth daily. 90 tablet 1  . HYDROcodone-acetaminophen (NORCO) 5-325 MG tablet Take 1 tablet by mouth every 4 (four) hours as needed for moderate pain. 60 tablet 0  . lisinopril (PRINIVIL,ZESTRIL) 20 MG tablet Take 1 tablet (20 mg total) by mouth daily. 90 tablet 1  . polyethylene glycol (MIRALAX / GLYCOLAX) packet Take 17 g by mouth daily.    . rivastigmine (EXELON) 3 MG capsule Take 1 capsule (3 mg total) by mouth 2 (two) times daily. 60 capsule 11  . sennosides-docusate sodium (SENOKOT-S) 8.6-50 MG tablet Take 2 tablets by mouth 2 (two) times daily.     No current facility-administered medications on file prior to visit.    Review of Systems  Constitutional: Negative for fever, chills, appetite change and unexpected weight change.  HENT: Negative for rhinorrhea and sore throat.   Respiratory: Positive for shortness of breath and wheezing.        Reports  dyspnea when she gets outside in cold air.  This just started since weather has gotten cold.   Stable 2 pillow orthopnea (years). + occasional snoring Denies DOE.  Cardiovascular: Negative for chest pain, palpitations and leg swelling.  Gastrointestinal: Negative for nausea, vomiting, abdominal pain, diarrhea, constipation and blood in stool.  Genitourinary: Negative for difficulty urinating.  Neurological: Positive for tremors. Negative for syncope and headaches.  Psychiatric/Behavioral: Positive for hallucinations.       Memory is a little better.  Decreased hallucinations but still  occasionally seeing people.   Fell 3 weeks ago at home while using walker and landed on floor.  She fell forward and landed on chest.  She did not hit her head or injure herself.  She denies pain.    Filed Vitals:   12/18/15 1031  BP: 104/60  Pulse: 90  Temp: 98.6 F (37 C)  TempSrc: Oral  Weight: 149 lb 14.4 oz (67.994 kg)  SpO2: 98%     Objective:   Physical Exam  Constitutional: She is oriented to person, place, and time. She appears well-developed. No distress.  HENT:  Head: Normocephalic and atraumatic.  Mouth/Throat: Oropharynx is clear and moist. No oropharyngeal exudate.  Eyes: Conjunctivae and EOM are normal. Pupils are equal, round, and reactive to light. Right eye exhibits no discharge. Left eye exhibits no discharge. No scleral icterus.  Neck: Neck supple. No thyromegaly present.  Cardiovascular: Normal rate, regular rhythm, normal heart sounds and intact distal pulses.  Exam reveals no gallop and no friction rub.   No murmur heard. Pulmonary/Chest: Effort normal and breath sounds normal. No respiratory distress. She has no wheezes. She has no rales.  Abdominal: Soft. Bowel sounds are normal. She exhibits no distension and no mass. There is no tenderness. There is no rebound and no guarding.  Musculoskeletal: Normal range of motion. She exhibits edema. She exhibits no tenderness.  Left left 2+ edema.  Right leg w/o edema.  The left knee surgical scar is well healed.  No calf TTP.  Neurological: She is alert and oriented to person, place, and time. No cranial nerve deficit.  B/L upper extremity rest tremor.  Rigid upper extremities.  Skin: Skin is warm. No rash noted. She is not diaphoretic. No erythema.  Psychiatric: She has a normal mood and affect. Her behavior is normal. Judgment and thought content normal.   MMSE 25/30     Assessment & Plan:  Please see problem based charting for A&P.

## 2015-12-18 NOTE — Assessment & Plan Note (Addendum)
Assessment:  Doing well two months post-op (LKR).  She just had follow-up with ortho earlier this week and I have reviewed their office note.  Xray revealed prosthesis in good position.  She was advised to use compression stockings for continued swelling.  She will follow-up with their office in 3 months. Plan:  I am concerned that her left leg is still so swollen.  Will obtain stat duplex US today to r/o DVT.  Follow-up with ortho as scheduled.

## 2015-12-18 NOTE — Assessment & Plan Note (Signed)
Assessment:  Recent labs WNL.  No symptoms or hyper/hypo.   Plan:  Repeat TSH, free T3 and free T4 next month.

## 2015-12-18 NOTE — Progress Notes (Signed)
VASCULAR LAB PRELIMINARY  PRELIMINARY  PRELIMINARY  PRELIMINARY  Bilateral lower extremity venous duplex completed per protocol with a DVT in the requested extremity  Preliminary report:  Bilateral  - Right - No evidence of DVT, superficial thrombosis, or Baker's cyst. Left - Positive for an acute DVT coursing throughout the entire lower extremity. There appears to be a mobile area in the distal common femoral. There is no evidence od a superficial thrombosis or Baker's cyst   Sundai Probert, RVS 12/18/2015, 4:50 PM

## 2015-12-18 NOTE — Assessment & Plan Note (Addendum)
Assessment:  B12 was low so we initiated daily and then weekly injection which she has completed.  Her MOCA was 15/30 at neurology visit last month so she was started on Exelon.  Memory is improving after course of B12 (levels are now normal) and addition of Exelon.  Her MMSE has improved to 25/30 today.  MRI did not reveal acute findings.  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 Plan:  Continue Exelon and follow-up with Dr. Lucia Gaskins (neurologist) as previously scheduled in two months.  RTC in June 2017 (the next time I will be in clinic) or sooner if she has problems.

## 2015-12-18 NOTE — Patient Instructions (Signed)
1. I will call you with results of leg ultrasound.  Please be sure to go for your mammogram and echo (ultrasound of your heart).     2. Please take all medications as prescribed.    3. If you have worsening of your symptoms or new symptoms arise, please call the clinic ([phone]), or go to the ER immediately if symptoms are severe.   Please come back next month for labwork only (to check your thyroid levels).  Please return to see me in June 2017 or sooner if you have problems.

## 2015-12-18 NOTE — Assessment & Plan Note (Signed)
Colonoscopy:  Completed DEXA:  Completed Mammo: refer today Zostavax:  Not covered by insurance in the past.  Will re-prescribe at next visit and see if it is now covered under new plan.

## 2015-12-18 NOTE — Assessment & Plan Note (Signed)
Assessment:  She reports dyspnea and wheezing when she first goes outside into the cold air in the AM.  This is usual for her in the winter months.  This responds to use of her albuterol inhaler.  She is only using it once per day.   Plan:  Continue albuterol inhaler prn.  She reports $45 co-pay for current inhaler so I will see if another brand is insurance preferred.

## 2015-12-18 NOTE — Assessment & Plan Note (Signed)
Assessment:  She uses a walker to ambulate at home.  She did have one fall last month while trying to walk to the bathroom. She did not hit her head or suffer injury.  Her husband says it was the same day she had the MRI so she had taken the Ativan which may have made her more unsteady on her feet.  No falls since.  She is on Sinemet for PD.   Plan:  Continue Sinemet and follow-up with Dr. Lucia Gaskins (neurology) in 2 months as scheduled.  Use walker at home.  Will order PT for gait/balance training.

## 2015-12-18 NOTE — Assessment & Plan Note (Signed)
Assessment:  Left leg w/ 2 + pitting edema.  Right leg has no swelling.  There is no leg pain.  The left leg swelling may be related to ortho procedure however she is 2 months out from surgery.  She did have 1 month post surgery trx with Eliquis but I am concerned that given her decreased mobility (Parkinson's ds) there is still some risk for DVT.   Plan:  Well's score for DVT is 2 putting her in high risk category so will proceed straight to STAT duplex US.

## 2015-12-18 NOTE — Assessment & Plan Note (Signed)
BP Readings from Last 3 Encounters:  12/18/15 104/60  10/14/15 117/44  10/02/15 102/68    Lab Results  Component Value Date   NA 141 10/14/2015   K 4.5 10/14/2015   CREATININE 0.87 10/14/2015    Assessment: Blood pressure control:  well controlled Progress toward BP goal:   at goal Comments: Reports compliance with lisinopril and HCTZ.  No ADRs.    Plan: Medications:  continue current medications:  Lisinopril  daily, HCTZ 12.5mg  daily. Other plans: Plan to send combo pill (lisinopril/HCTZ) with next refill to reduce pill burden.

## 2015-12-21 NOTE — Progress Notes (Signed)
[facility] Attending  Case discussed with Dr. Wilson at the time of the visit.  We reviewed the resident's history and exam and pertinent patient test results.  I agree with the assessment, diagnosis, and plan of care documented in the resident's note.  

## 2015-12-23 ENCOUNTER — Telehealth: Payer: Self-pay | Admitting: *Deleted

## 2015-12-23 NOTE — Telephone Encounter (Signed)
SPOKE WITH PATIENT AND REMINDED HER OF APPOINTMENT [phone] WITH [facility].

## 2015-12-25 ENCOUNTER — Other Ambulatory Visit: Payer: Self-pay | Admitting: Internal Medicine

## 2015-12-25 DIAGNOSIS — Z1231 Encounter for screening mammogram for malignant neoplasm of breast: Secondary | ICD-10-CM

## 2015-12-29 ENCOUNTER — Encounter (INDEPENDENT_AMBULATORY_CARE_PROVIDER_SITE_OTHER): Payer: PPO | Admitting: Ophthalmology

## 2015-12-29 DIAGNOSIS — I10 Essential (primary) hypertension: Secondary | ICD-10-CM | POA: Diagnosis not present

## 2015-12-29 DIAGNOSIS — H43813 Vitreous degeneration, bilateral: Secondary | ICD-10-CM | POA: Diagnosis not present

## 2015-12-29 DIAGNOSIS — H353132 Nonexudative age-related macular degeneration, bilateral, intermediate dry stage: Secondary | ICD-10-CM | POA: Diagnosis not present

## 2015-12-29 DIAGNOSIS — H35033 Hypertensive retinopathy, bilateral: Secondary | ICD-10-CM

## 2015-12-29 DIAGNOSIS — H3553 Other dystrophies primarily involving the sensory retina: Secondary | ICD-10-CM | POA: Diagnosis not present

## 2015-12-29 LAB — HM DIABETES EYE EXAM

## 2016-01-01 ENCOUNTER — Other Ambulatory Visit: Payer: Self-pay

## 2016-01-01 ENCOUNTER — Ambulatory Visit (HOSPITAL_COMMUNITY): Payer: PPO | Attending: Cardiology

## 2016-01-01 DIAGNOSIS — Z8249 Family history of ischemic heart disease and other diseases of the circulatory system: Secondary | ICD-10-CM | POA: Diagnosis not present

## 2016-01-01 DIAGNOSIS — E785 Hyperlipidemia, unspecified: Secondary | ICD-10-CM | POA: Insufficient documentation

## 2016-01-01 DIAGNOSIS — R0600 Dyspnea, unspecified: Secondary | ICD-10-CM | POA: Diagnosis not present

## 2016-01-01 DIAGNOSIS — I059 Rheumatic mitral valve disease, unspecified: Secondary | ICD-10-CM | POA: Diagnosis not present

## 2016-01-01 DIAGNOSIS — I10 Essential (primary) hypertension: Secondary | ICD-10-CM | POA: Diagnosis not present

## 2016-01-01 DIAGNOSIS — R0601 Orthopnea: Secondary | ICD-10-CM

## 2016-01-06 ENCOUNTER — Other Ambulatory Visit: Payer: Self-pay | Admitting: Internal Medicine

## 2016-01-06 DIAGNOSIS — J4520 Mild intermittent asthma, uncomplicated: Secondary | ICD-10-CM

## 2016-01-06 MED ORDER — ALBUTEROL SULFATE HFA 108 (90 BASE) MCG/ACT IN AERS: 2.0000 | INHALATION_SPRAY | Freq: Four times a day (QID) | RESPIRATORY_TRACT | Status: DC | PRN

## 2016-01-07 ENCOUNTER — Telehealth: Payer: Self-pay | Admitting: Internal Medicine

## 2016-01-07 NOTE — Telephone Encounter (Signed)
I sent a new inhaler (Proventil) into her pharmacy yesterday.  I asked her to call them and ask what the co-pay will be.  If it is just as expensive as ProAir then we can try another brand.

## 2016-01-07 NOTE — Telephone Encounter (Signed)
Patient is stating that she was supposed to have a different inhaler sent to the pharmacy because she can not afford the one we sent for her.

## 2016-01-08 NOTE — Telephone Encounter (Signed)
i spoke w/ pharm

## 2016-01-15 ENCOUNTER — Encounter: Payer: Self-pay | Admitting: Internal Medicine

## 2016-01-15 DIAGNOSIS — H35033 Hypertensive retinopathy, bilateral: Secondary | ICD-10-CM | POA: Insufficient documentation

## 2016-01-19 ENCOUNTER — Encounter: Payer: Self-pay | Admitting: *Deleted

## 2016-02-01 ENCOUNTER — Ambulatory Visit (INDEPENDENT_AMBULATORY_CARE_PROVIDER_SITE_OTHER): Payer: PPO | Admitting: Neurology

## 2016-02-01 ENCOUNTER — Encounter: Payer: Self-pay | Admitting: Neurology

## 2016-02-01 VITALS — BP 162/100 | HR 70 | Ht 62.0 in | Wt 148.4 lb

## 2016-02-01 DIAGNOSIS — G20 Parkinson's disease: Secondary | ICD-10-CM | POA: Diagnosis not present

## 2016-02-01 MED ORDER — RIVASTIGMINE TARTRATE 4.5 MG PO CAPS: 4.5000 mg | ORAL_CAPSULE | Freq: Two times a day (BID) | ORAL | Status: DC

## 2016-02-01 MED ORDER — ROTIGOTINE 2 MG/24HR TD PT24: 1.0000 | MEDICATED_PATCH | Freq: Every day | TRANSDERMAL | Status: DC

## 2016-02-01 NOTE — Patient Instructions (Addendum)
Remember to drink plenty of fluid, eat healthy meals and do not skip any meals. Try to eat protein with a every meal and eat a healthy snack such as fruit or nuts in between meals. Try to keep a regular sleep-wake schedule and try to exercise daily, particularly in the form of walking, 20-30 minutes a day, if you can.   As far as your medications are concerned, I would like to suggest:  Start Neupro patch if insurance approved. Discard and put a new one on every day. Use a different patch of skin ever day on the upper arms or thighs. Will call you in 2 weeks and see how it is working and may increase it Increase rivastigmine for memory to 4.5 mg twice daily.  I would like to see you back in 4 months, sooner if we need to. Please call us with any interim questions, concerns, problems, updates or refill requests.   Our phone number is [phone]. We also have an after hours call service for urgent matters and there is a physician on-call for urgent questions. For any emergencies you know to call 911 or go to the nearest emergency room

## 2016-02-01 NOTE — Progress Notes (Signed)
[facility]    Provider:  Dr Lucia Gaskins Referring Provider: Yolanda Manges, DO Primary Care Physician:  Evelena Peat, DO  Provider: Dr Lucia Gaskins Referring Provider: Yolanda Manges, DO Primary Care Physician: Evelena Peat, DO  CC: Parkinsonism  Interval history 02/01/2016: She feels the medication is helping. She has not taken the Sinemet since yesterday. When she takes the sinemet the tremor improves. She takes it 3x a day. Here with husband. Giving ut to her at 10am, 1pm and 8 pm. Advised to take it every 4 hours. Wears off in 2-3 hours. Discussed trying a patch every day to see if that helps with medication administration. When she takes the medication the tremor improves and then it wears off in 3 hours. No falls. She forgets to take the medication. She is having some hallucinations. She was seeing people that were not there and babies not there. Her memory is stable. She has drooling. Drool on her pillow and sometimes during the day and it starts dripping. No difficulty swallowing. No choking on foods. No falls. Can continue to take the Sinemet.   Interval History 11/03/2015: here for follow up of Parkinsonism. She says she ran out of the prescription a few days ago and she has not taken the Sinemet in a few days. It really helps when she takes it. When she takes the Sinemet, the tremors stop. No side effects from the Sinemet. She feels the Sinemet lasts until the next dose. She has memory loss. We had ordered an MRI of the brain and she tried to do it but got claustrophobic. We need to get an MRi of the brain due to parkinsonism and memory loss. Will order and give some Xanax before going in to an open MRI. She has a manual wheelchair, we tried to get her a power wheelchair but it was too expensive. She feels much improved since being discharged from the hospital and starting B12 injections. B12 was 185 and she is having injections. Last thyroid was normal. Will do a MoCA today and  start Rivastigmine for memory loss. Denies any confusion or altered mentation recently. They take the sinemet at 9am then 3or 4pm then 9pm. Discussed should take it every 4 hours. 9am, 1pm and 5pm. Do not take it with protein. No falls. No problems swallowing, not choking on food. No dyskinesias.  Addendum 07/22/2015: Called to see how she is doing. She hasn't taken the Sinemet since yesterday. Not taking it three times a day as prescribed. Sounds like it is difficullt for patient to remember to take her medication. She does say when she takes the medication it helps.   Interval update 06/24/2015: They are taking 1/2 pill of the sinemet with breakfast. She forgot she is not supposed to take it with food. It helps the tremor. No side effects from the Sinemet. Will increase the sinemet to a whole pill three times a day. She did not take her sinemet this morning. Symptoms brain they're Sinemet with some the next time they come, we can take it in clinic and evaluate response. But if the Sinemet appears to help with her tremor, and this confirms the diagnosis of Parkinson's. May consider adding Azilect concurrently.  Interval update 05/12/2014: She had the radioactive iodine treatment and tremor is much improved. She still has bilateral upper resting tremor. She says she is walking a little better but husband disagrees. Husband says her knee hurts and stays sore. Husband feels her voice volume is lower and  he can't hear her. She shuffles. Her sense of smell is poor. She is off balance.   We'll start Sinemet low dose. They are to call me back in 2 weeks to discuss how patient is responding. Discussed common side effects including nausea and GI side effects, orthostatic hypotension, dizziness and increased risk of falls. They are to stop the medication if significant side effects occur.   04/14/2015: Nicole Baxter is a 75 y.o. female here as a referral from Dr. Andrey Campanile for tremor. She has a past medical history of  recently diagnosed hyperthyroidism. She is here with her husband who provides much of the information. She also has a past medical history of essential tremor, hypertension, asthma, hyperlipidemia, asthma, diabetes, osteoarthritis. No history of dopamine blocking agents. 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  She has a daily tremor. She had a tremor in the past, similar tremor but it went away. It came back last year. She has good days and bad days with the tremor. Her right leg sometimes won't move. There are some days when it is better. She shuffles and is slow walking. She can't move when walking sometimes, she freezes. The tone of her voice is lower, husband says she used to talk much louder. She doesn't yell anymore. She denies decreased smell sensation. She has constipation. Her hand writing is terrible. She is able to eat with her tremor. Tremor is better with movement. Worse at rest. Answer legs shake, feels like nervousness. Mother has a tremor. Cousins have tremors so they think it is a family issue. She is hyperthyroid and is going to [facility] for treatment. No FHx of PD. She uses a walker at baseline. Walking has worsened.  Reviewed notes, labs and imaging from outside physicians, which showed: Notes state the patient has been seen by neurology in the past however I cannot find a note in Epic. She follows with Dr. Andrey Campanile who notes resting tremor, rigidity and bradykinesia concerning for Parkinson's disease. She was started on gabapentin 100 mg 3 times a day and referred to neurology. Notes state that years ago she was seen by neurologist and advised to take Benadryl for tremor. Dr. Andrey Campanile advised her to stop due to risk of anticholinergic symptoms in the elderly. TSH decreased 0.132 weeks ago, liver function tests with elevated AST and a  LT, BMP unremarkable  Review of Systems: Patient complains of symptoms per HPI as well as the following symptoms: joint pain, nack pain, neck pain, no CP, no SOB. Pertinent negatives per HPI. All others negative.   Social History   Social History  . Marital Status: Married    Spouse Name: N/A  . Number of Children: 5  . Years of Education: 12   Occupational History  . Not on file.   Social History Main Topics  . Smoking status: Never Smoker   . Smokeless tobacco: Never Used  . Alcohol Use: No  . Drug Use: No  . Sexual Activity: No   Other Topics Concern  . Not on file   Social History Narrative   Worked at [employer] 31 years. Married.Active, out and about q day.   5 children, 1 with sarcoidosis, 1 with COPD.   Caffeine use: Drinks a 6 pack of soda per week    Family History  Problem Relation Age of Onset  . Heart disease  Mother 50    died of MI @ 29  . Alcohol abuse Father   . Cancer Brother   . Colon cancer Neg Hx     Past Medical History  Diagnosis Date  . Obesity   . Hyperglycemia     isolated FBS 122, 95, 104 (Random 138)  . 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 Hyperlipidemia     on pravastatin  . Hypertension     controlled on 2 agents  . Parkinson's disease (tremor, stiffness, slow motion, unstable posture) (HCC)   . Tremor   . GERD (gastroesophageal reflux disease)     pt. states she no longer has GERD (09/11/15)  . Arthritis   . Shortness of breath dyspnea     pt. states that she no longer has SOB )09/11/15    Past Surgical History  Procedure Laterality Date  . Colonoscopy    . Cesarean section    . 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 acetaminophen (TYLENOL) 500 MG tablet Take 500 mg by mouth every 6 (six) hours  as needed.    Marland Kitchen albuterol (PROVENTIL HFA;VENTOLIN HFA) 108 (90 Base) MCG/ACT inhaler Inhale 2 puffs into the lungs every 6 (six) hours as needed for wheezing or shortness of breath. 1 Inhaler 2  . apixaban (ELIQUIS) 5 MG TABS tablet Take 1 tablet (5 mg total) by mouth 2 (two) times daily. 60 tablet 2  . atorvastatin (LIPITOR) 40 MG tablet Take 1 tablet (40 mg total) by mouth daily. Patient requested easy open caps for all medication bottles. 30 tablet 3  . bisacodyl (DULCOLAX) 5 MG EC tablet Take 1 tablet (5 mg total) by mouth daily as needed for moderate constipation. 30 tablet 0  . Calcium Carbonate-Vitamin D (CALCIUM 600/VITAMIN D) 600-400 MG-UNIT tablet Take 2 tablets by mouth daily. 180 tablet 1  . carbidopa-levodopa (SINEMET) 25-100 MG tablet Take 1 tablet by mouth 3 (three) times daily. Take 4 hours apart. 270 tablet 4  . hydrochlorothiazide (HYDRODIURIL) 12.5 MG tablet Take 1 tablet (12.5 mg total) by mouth daily. 90 tablet 1  . lisinopril (PRINIVIL,ZESTRIL) 20 MG tablet Take 1 tablet (20 mg total) by mouth daily. 90 tablet 1  . polyethylene glycol (MIRALAX / GLYCOLAX) packet Take 17 g by mouth daily.    . rivastigmine (EXELON) 4.5 MG capsule Take 1 capsule (4.5 mg total) by mouth 2 (two) times daily. 60 capsule 11  . apixaban (ELIQUIS) 5 MG TABS tablet Take 2 tablets (10 mg total) by mouth 2 (two) times daily. 28 tablet 0  . rotigotine (NEUPRO) 2 MG/24HR Place 1 patch onto the skin daily. 30 patch 12  . sennosides-docusate sodium (SENOKOT-S) 8.6-50 MG tablet Take 2 tablets by mouth 2 (two) times daily. Reported on 12/18/2015     No current facility-administered medications for this visit.    Allergies as of 02/01/2016  . (No Known Allergies)    Vitals: BP 162/100 mmHg  Pulse 70  Ht  (1.575 m)  Wt 148 lb 6.4 oz (67.314 kg)  BMI 27.14 kg/m2 Last Weight:  Wt Readings from Last 1 Encounters:  02/01/16 148 lb 6.4 oz (67.314 kg)   Last Height:   Ht Readings from Last 1  Encounters:  02/01/16  (1.575  m)     Coordination: bradykinesia and dec. amplitude on finger taps  Gait: freezing, shuffling, en-bloc turning. Decreased arm swing, re-emergent tremor. Bradykinesia.     Motor Observation:  Resting tremor in the bilateral upper extremities.  Tone:  Increased tone in the upper extremities  Posture:  stooped   Strength:  Strength is intact in the upper and lower limbs.    Sensation: intact to LT      Assessment/Plan: Nicole Baxter is a 75 y.o. female here as a referral from Dr. Andrey CampanileWilson for tremor. She has a past medical history of recently diagnosed hyperthyroidism(treated, last tsh wnl), essential tremor, hypertension, asthma, hyperlipidemia, diabetes, osteoarthritis. No history of dopamine blocking agents. 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 Sinemet helps her resting tremor however she forgets to take it.  Needs an MRI of the brain for parkinsonism and memory loss. Alprazolam for MRI. They have not completed despite multiple recommendations.  Patient does report freezing, shuffling of gait, resting tremor, hypophonia, impaired smell. Exam does show bradykinesia on finger taps and walking, freezing, en-bloc turning, increased tone in the upper extremities and resting tremor in upper extremities. We'll try a dopamine agonist patch and see if this helps with compliance. Can start Neupro patch 2 mg daily, discard old one and a point Daily. Can increase to 4 mg at next appointment. Discussed side effects such as hypotension, syncope, hallucinations, psychosis, dyskinesia, nausea, somnolence, dizziness, vomiting, fatigue, sleep disturbance, edema, hallucinations, compulsive behaviors and others. Stop for anything concerning.  Will continue  Sinemet to 1 pill 3x a day. Asked husband to pay attention when she takes the pill and see if tremor improves, how much it improves, and when the Sinemet starts wearing off. Watch for side effects. Take it 4 hours apart such as 9am, 1pm and 5pm. Do not take with protein, take 30 minutes before or an hour after eating protein.   She is in physical therapy currently. Taking the IM B12 injections. Her thyroid appears to be normalized, had hyperthyroidism.   Memory loss - MoCA 15/30. Increase Rivastigmine 4.5mg  twice daily.  Discussed side effects: Nausea, vomiting, diarrhea, loss of appetite/weight loss, dizziness, drowsiness, weakness, trouble sleeping, shakiness (tremor), or muscle cramps. More serious side effects can include AV block, bradycardia, syncope, seizures, GI bleeding, rash.  Can start Namenda at a later time.  Needs follow up in 3 months with me.  Discussed parkinson's disease vs Parkinsonism, what it is and the symptoms. Discussed that Parkinson's disease is a neurodegenerative disease which involves dopamine in the brain. Progression is variable. People with Parkinson's can display tremor, shuffling, stooped posture, impaired balance and falls, lowered voice volume, constipation, decreased smell sensation. Risk of falls is especially problematic and vigilance as needed. Patient should always use her walker. Provided patient handout on Parkinson's disorder, online organizations as well as Parkinson's support group [place] with contact information.    Naomie DeanAntonia Karie Skowron, MD  [facility] [address] [place], [place] [id number]  Phone [phone] Fax [phone]  A total of 25 minutes was spent face-to-face with this patient. Over half this time was spent on counseling patient on the Parkinsonism diagnosis and different diagnostic and therapeutic options available.

## 2016-02-02 ENCOUNTER — Telehealth: Payer: Self-pay | Admitting: Neurology

## 2016-02-02 NOTE — Telephone Encounter (Signed)
Called [facility] NEU PRO had to be ordered. [facility] relayed cost to me . I will give Kara MeadEmma RN and Dr. Lucia GaskinsAhern Form for assistance for patient . Kara Meadmma I will fax form and process for you . Kara Meadmma RX will need to be attached along with hight lighted area's. I completed as much as I could on form. Thanks Annabelle Harmanana.

## 2016-02-02 NOTE — Telephone Encounter (Signed)
Thank you :)

## 2016-02-05 ENCOUNTER — Other Ambulatory Visit: Payer: Self-pay | Admitting: *Deleted

## 2016-02-05 MED ORDER — ROTIGOTINE 2 MG/24HR TD PT24: 1.0000 | MEDICATED_PATCH | Freq: Every day | TRANSDERMAL | Status: DC

## 2016-02-15 NOTE — Telephone Encounter (Signed)
Called and left patient a message about her patient assistance . All forms are complete I need patient's     proof of income to submit or Patient  Assistance .

## 2016-03-02 ENCOUNTER — Ambulatory Visit: Payer: PPO | Admitting: Physical Therapy

## 2016-03-08 ENCOUNTER — Telehealth: Payer: Self-pay | Admitting: Neurology

## 2016-03-08 NOTE — Telephone Encounter (Signed)
Dr Lucia GaskinsAhern- I see an rx neupro patch printed on 3/10 but I don't think it was sent. Can you place new order? Thank you!

## 2016-03-08 NOTE — Telephone Encounter (Signed)
[facility] Faxed to pt pharmacy. Fax: [phone]. Received confirmation.

## 2016-03-08 NOTE — Telephone Encounter (Signed)
[facility] sent a fax relaying NEU PRO is covered under patient's insurance. RX can be sent in to Pharmacy . Kara Meadmma I will place [facility] fax on your desk just so you can see. But you don't have to Keep. I have called called patient's husband  But I left a message. Stating that Patient will  Not need Patient assistance for NEUPRO. Also Relayed Dr. Lucia GaskinsAhern was out the office until 04/196/2017.

## 2016-03-15 DIAGNOSIS — M25562 Pain in left knee: Secondary | ICD-10-CM | POA: Diagnosis not present

## 2016-03-16 ENCOUNTER — Ambulatory Visit: Payer: PPO | Admitting: Physical Therapy

## 2016-03-17 ENCOUNTER — Telehealth: Payer: Self-pay | Admitting: *Deleted

## 2016-03-17 ENCOUNTER — Telehealth (HOSPITAL_COMMUNITY): Payer: Self-pay | Admitting: Physical Therapy

## 2016-03-17 ENCOUNTER — Ambulatory Visit (HOSPITAL_COMMUNITY): Payer: PPO | Attending: Internal Medicine | Admitting: Physical Therapy

## 2016-03-17 DIAGNOSIS — M6281 Muscle weakness (generalized): Secondary | ICD-10-CM

## 2016-03-17 DIAGNOSIS — R293 Abnormal posture: Secondary | ICD-10-CM

## 2016-03-17 DIAGNOSIS — Z9181 History of falling: Secondary | ICD-10-CM | POA: Diagnosis not present

## 2016-03-17 DIAGNOSIS — R2681 Unsteadiness on feet: Secondary | ICD-10-CM | POA: Diagnosis not present

## 2016-03-17 DIAGNOSIS — R262 Difficulty in walking, not elsewhere classified: Secondary | ICD-10-CM | POA: Diagnosis not present

## 2016-03-17 NOTE — Telephone Encounter (Signed)
After finishing evaluation, called Dr. Tawana ScaleWilson's office and spoke to RN. Explained situation that patient had been diagnosed with blood clot and that patient/family say that she is not on any blood thinners right now/stopped taking them (patient cannot recall when) because she did not want to be on so many medicines. RN reports patient may likely receive home health nursing to ensure that medications are in order and are being taken correctly.  PT later spoke to rehabilitation supervisor, who however stated that even if patient is only receiving home health nursing, she will no longer qualify for reimbursement for outpatient PT, and will need HHPT during this period.   Attempted to call MD's office back with this information later in day, however line busy so send RN/MD a message through [facility] detailing situation.   Nedra HaiKristen Unger PT, DPT [phone]

## 2016-03-17 NOTE — Telephone Encounter (Signed)
[facility] and states when she did initial visit and assessment she found pt is stating that she is not taking some of her meds because she doesn't want to take a lot of pills, she states she is not taking the eliquis and due to pt's risk of DVT PT was not initiated in usual strong manner. Could we look into [facility] especially for med management Will attempt to make pt appt, dr Andrey Campanilewilson your first available is 6/2 Will send to dr Lorenso Courierwilson, shana and dr Selena Battenkim

## 2016-03-17 NOTE — Telephone Encounter (Signed)
[name].  I will contact patient and inquire if [name] is [facility] for RN or Pharmacist medication education and management.

## 2016-03-17 NOTE — Therapy (Signed)
[facility] [address] [place], [place], [address] Phone: [phone]   Fax:  [phone]  Physical Therapy Evaluation  Patient Details  Name: Nicole Baxter MRN: [id number] Date of Birth: [birth year] Referring Provider: Evelena Peat   Encounter Date: 03/17/2016      PT End of Session - 03/17/16 1131    Visit Number 1   Number of Visits 1   Date for PT Re-Evaluation 04/21/16   Authorization Type [employer]    Authorization Time Period 03/17/16 to 05/17/16   Authorization - Visit Number 1   Authorization - Number of Visits 10   PT Start Time 0948   PT Stop Time 1013   PT Time Calculation (min) 25 min   Activity Tolerance Patient tolerated treatment well   Behavior During Therapy [facility] for tasks assessed/performed      Past Medical History  Diagnosis Date  . Obesity   . Hyperglycemia     isolated FBS 122, 95, 104 (Random 138)  . 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 Hyperlipidemia     on pravastatin  . Hypertension     controlled on 2 agents  . Parkinson's disease (tremor, stiffness, slow motion, unstable posture) (HCC)   . Tremor   . GERD (gastroesophageal reflux disease)     pt. states she no longer has GERD (09/11/15)  . Arthritis   . Shortness of breath dyspnea     pt. states that she no longer has SOB )09/11/15    Past Surgical History  Procedure Laterality Date  . Colonoscopy    . Cesarean section    . 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       Subjective Assessment - 03/17/16 0958    Subjective Patient reports that she started not feeling good after she was put under for her total knee surgery, family reports that this is also when Parkinsonian symtpoms started showing up as well and that she was using a  walker before all of this. She does report difficulty in opening jars and dressing as well. Patient has not had any falls or close calls recently; she has only had one fall when she was getting out of the car when she fell on family member. Patient also later reports that she had a fall in her home not too long ago. Standing up is difficult for her to do right now; walking is also difficult. Not sleeping real well, reports hallucinations, also reports freezing episodes. Also reports festinating gait pattern.    Pertinent History blood thinner (not currently taking per patient- will check with MD), HTN, GERD, DM, Parkinson, OA, SOB, hx of acute blood clot in January 2017   Patient Stated Goals walking better, better mobility    Currently in Pain? Yes   Pain Score 10-Worst pain ever   Pain Location Heel   Pain Orientation Right   Pain Descriptors / Indicators Sore   Pain Type Chronic pain   Pain Radiating Towards none    Pain Onset More than a month ago   Pain Frequency Intermittent   Aggravating Factors  standing on it    Pain Relieving Factors not really sure    Effect of Pain on Daily Activities stops her from weight bearing  and walking             [facility] PT Assessment - 03/17/16 0001    Assessment   Medical Diagnosis parkinsons/gait difficulty    Referring Provider Evelena Peat    Onset Date/Surgical Date --  chronic    Next MD Visit no follow up    Precautions   Precaution Comments acute blood clot, no blood thinners- going to confirm with MD    Balance Screen   Has the patient fallen in the past 6 months Yes   How many times? 2   Has the patient had a decrease in activity level because of a fear of falling?  No   Is the patient reluctant to leave their home because of a fear of falling?  No   Prior Function   Level of Independence Needs assistance with ADLs;Needs assistance with homemaking;Needs assistance with gait;Needs assistance with transfers   Vocation Retired   Leisure  none    Observation/Other Assessments   Observations moderate resting tremor noted; RAM intact L UE, mild impairment R UE; mild rigidity noted with UE testing. Difficulty with transitional movments such as crossing through door frames, performing turns, waling over different surfaces   edmea increased R LE vs L LE    Posture/Postural Control   Posture Comments forward head, B IR shoulders, flexed posture, kyphosis    AROM   Overall AROM Comments stifness noted in ankles, hips; genearl postural stifness noted    Strength   Overall Strength Comments core 3 to 3+/5 in general    Right Hip Flexion 3/5   Right Hip ABduction 3+/5   Left Hip Flexion 3/5   Left Hip ABduction 3+/5   Right Knee Extension 3/5   Left Knee Extension 3/5   Right Ankle Dorsiflexion 3/5   Left Ankle Dorsiflexion 3/5   Transfers   Transfers Sit to Stand;Stand to Sit   Sit to Stand 4: Min guard;With upper extremity assist;With armrests;Multiple attempts   Stand to Sit 4: Min guard;Uncontrolled descent;With upper extremity assist   Ambulation/Gait   Ambulation/Gait Yes   Ambulation Distance (Feet) 25 Feet   Assistive device Rollator   Gait Pattern Step-to pattern;Decreased arm swing - left;Decreased arm swing - right;Decreased step length - right;Decreased step length - left;Decreased stance time - right;Decreased stance time - left;Decreased hip/knee flexion - right;Decreased hip/knee flexion - left;Decreased dorsiflexion - right;Decreased dorsiflexion - left;Right foot flat;Left foot flat;Shuffle;Festinating;Trunk flexed;Narrow base of support;Poor foot clearance - left;Poor foot clearance - right   Ambulation Surface Level   Gait Comments noted festinating, shuffling, and occasional freezing episodes during gait especially with transitions such as going through door frames and over different surfaces    6 minute walk test results    Endurance additional comments poor activity tolerance    High Level Balance   High  Level Balance Comments general unsteadiness noted                            PT Education - 03/17/16 1129    Education provided Yes   Education Details extensive education regarding improtance of adhering to blood thinners as prescrbied by MD, concerns regarding PE/death with mobiity without blood thinners. Eval limited today for safety.    Person(s) Educated Patient;Caregiver(s)   Methods Explanation   Comprehension Verbalized understanding;Need further instruction          PT Short Term Goals - 03/17/16 1154    PT SHORT  TERM GOAL #1   Title Patient and family to be educated and aware of inherent risks of not taking blood thinners as prescribed   Time 1   Period Days   Status Achieved           PT Long Term Goals - 03/17/16 1154    PT LONG TERM GOAL #1   Title N/A- MD referring to [facility] nursing, so patient will need HHPT as well    Time 1   Period Days   Status New               Plan - 03/17/16 1140    Clinical Impression Statement Patient arrives today with diagnosis of Parkinsons Disease and gait impairment; noted history of recent acute blood clot in patient's chart, and asked patient if she was continuing to take blood thinners (Eliquis)- however, patient said she was not and could not remember when she went off of the medicine, just stated that she stopped taking it because she did not want to take more medications. Family member confirmed taht she was no longer taking this medicine. Provided extensive edcuation regarding importance of taking blood thinners as prescribed by MD due to presence of clot, and potential hazardous effects of not taking medicine including pulmonary embolism and potentially death; limited intensity and extent of evaluation today due to patient status. Patient does demonstrate significant classic symptoms of parkinsons disease, including severe gait and functional mobiltiy limitations, impairments in balance with general  unsteadiness, freezing episodes and festination, mild rigidity and impaired RAM movements, postural stiffness and impairment, and in general reduced safety awareness. Called patient's MD immediately after dismissing patient today; spoke to RN and told her about situation- RN shocked to hear that patient not actively taking blood thinners and advised that they are going to have home health RN services come to patient to make sure she is taking medications as prescribed at this point. At this piont, since patient is giong to recieve home health nursing, unfortunately she will not be eligible for OP PT- PT wil plan to call MD office later today to educate that patient will need HHPT prescribed as well, will also call family later today.    Rehab Potential Good   PT Frequency 2x / week   PT Duration 8 weeks   PT Treatment/Interventions ADLs/Self Care Home Management;Biofeedback;Gait training;Stair training;Functional mobility training;Therapeutic activities;Therapeutic exercise;Balance training;Neuromuscular re-education;Patient/family education;Manual techniques;Passive range of motion;Energy conservation;Taping   PT Next Visit Plan PT will follow up with MD office regarding need for HHPT, will also educate family regarding situation and advise that they can return here after HHPT is done    PT Home Exercise Plan not given today due to acute clots with no blood thinner    Recommended Other Services MD assist in managing situation    Consulted and Agree with Plan of Care Patient;Family member/caregiver      Patient will benefit from skilled therapeutic intervention in order to improve the following deficits and impairments:  Abnormal gait, Decreased endurance, Hypomobility, Impaired tone, Decreased activity tolerance, Decreased strength, Pain, Decreased balance, Decreased mobility, Difficulty walking, Decreased range of motion, Improper body mechanics, Decreased coordination, Decreased safety awareness,  Impaired flexibility, Postural dysfunction  Visit Diagnosis: History of falling - Plan: PT plan of care cert/re-cert  Unsteadiness on feet - Plan: PT plan of care cert/re-cert  Difficulty in walking, not elsewhere classified - Plan: PT plan of care cert/re-cert  Muscle weakness (generalized) - Plan: PT plan of care cert/re-cert  Abnormal posture - Plan: PT plan of care cert/re-cert      G-Codes - 03/17/16 1156    Functional Assessment Tool Used Based on skilled clinical assessment of gait, functional mobility, functional balance,strength, safety awareness    Functional Limitation Mobility: Walking and moving around   Mobility: Walking and Moving Around Current Status ([id number]) At least 60 percent but less than 80 percent impaired, limited or restricted   Mobility: Walking and Moving Around Goal Status [phone]([id number]) At least 40 percent but less than 60 percent impaired, limited or restricted       Problem List Patient Active Problem List   Diagnosis Date Noted  . Hypertensive retinopathy of both eyes, grade 2 01/15/2016  . Left leg swelling 12/18/2015  . 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 Memory problem 10/14/2015  . DJD (degenerative joint disease) of knee 09/23/2015  . Tremor 04/14/2015  . Diverticulosis 03/10/2015  . Internal hemorrhoids 03/10/2015  . Osteopenia 03/02/2015  . Elevated liver enzymes 03/02/2015  . History of colon polyps 12/25/2014  . Vitamin D deficiency 12/25/2014  . Tendinopathy of right rotator cuff 02/21/2014  . Health care maintenance 02/13/2014  . Subclinical hyperthyroidism 12/25/2013  . Right arm pain 12/24/2013  . Ambulatory dysfunction 12/06/2013  . Kidney mass 06/11/2012  . NSAID-associated gastropathy 06/06/2012  . Right kidney mass 06/06/2012  . Osteoarthritis of both knees 03/15/2012  . Asthma 09/20/2011  . Parkinsonism (HCC) 07/13/2010  . Diabetes mellitus type 2, diet-controlled (HCC) 05/22/2009  . ALLERGIC  RHINITIS, SEASONAL 02/20/2007  . OCCUPATIONAL ASTHMA 12/11/2006  . Hyperlipidemia 09/27/2006  . OBESITY 09/27/2006  . Essential hypertension 09/27/2006  . GERD 09/27/2006    Nedra HaiKristen Unger PT, DPT [phone]  [facility] [address] [place], [place], [address] Phone: [phone]   Fax:  [phone]  Name: Randa NgoRuby L Reeb MRN: [id number] Date of Birth: 05/01/1941

## 2016-03-17 NOTE — Telephone Encounter (Signed)
Today, 03/17/16 is the last day for utilize Dr. Tawana ScaleWilson's 12/18/15 visit [facility] for the face to face encounter.  If order is not placed today, [name] must have a face to face encounter prior to home health referral.

## 2016-03-18 ENCOUNTER — Telehealth: Payer: Self-pay | Admitting: Licensed Clinical Social Worker

## 2016-03-18 NOTE — Telephone Encounter (Signed)
CSW placed call to Ms. Prashad to inquire if pt would be [facility] and medication education services.  CSW left message requesting return call. CSW provided contact hours and phone number.

## 2016-03-22 ENCOUNTER — Telehealth (HOSPITAL_COMMUNITY): Payer: Self-pay | Admitting: Physical Therapy

## 2016-03-22 ENCOUNTER — Telehealth: Payer: Self-pay

## 2016-03-22 NOTE — Telephone Encounter (Signed)
Called and spoke to patient's husband; he reports that they do not have home health nursing set up via MD at this point, and patient is not taking Eliquis because he thinks she is out of that medication. Educated again regarding importance of properly taking blood thinners and advised to cancel session on 4/26 pending PT talking to MD. Considering putting patient on hold until matter is settled one way or the other (home health nursing vs blood thinners), will follow up with this later.   Nedra HaiKristen Unger PT, DPT [phone]

## 2016-03-22 NOTE — Telephone Encounter (Signed)
PT was checking status of HH RN referral- advised that SW had [facility] and as of today had no response.  PT is unable to see pt until her medication management is under control (specifically her adherence to blood thinner).  She will put pt on hold and follow up in 1-2 weeks

## 2016-03-22 NOTE — Telephone Encounter (Signed)
RN returned phone call; reviewed patient's notes and advised that they are [facility], wasn't sure if they are going to follow up on home health nursing for blood thinners. PT advised nurse regarding situation on our end, IE not being eligible for outpatient PT if home health nursing in picture, and that patient not safe to do PT until blood thinners are in system.   At this time RN reports that they would like PT to put patient on hold for now, but not DC just yet; PT to follow up with MD in 2-3 weeks.   PT will have front desk follow up with patient/family in AM.  Nedra HaiKristen Unger PT, DPT [phone]

## 2016-03-22 NOTE — Telephone Encounter (Signed)
Please call back [facility].

## 2016-03-22 NOTE — Telephone Encounter (Signed)
Called MD office to find out if they are still planning on home health nursing; had to leave message requesting RN call PT back.  Nedra HaiKristen Cathey [place] PT, DPT [phone]

## 2016-03-23 ENCOUNTER — Ambulatory Visit (HOSPITAL_COMMUNITY)
Admission: RE | Admit: 2016-03-23 | Discharge: 2016-03-23 | Disposition: A | Discharge status: Home / Self Care | Payer: PPO | Source: Ambulatory Visit | Attending: Internal Medicine | Admitting: Internal Medicine

## 2016-03-23 ENCOUNTER — Ambulatory Visit (HOSPITAL_COMMUNITY): Payer: PPO

## 2016-03-23 ENCOUNTER — Ambulatory Visit (INDEPENDENT_AMBULATORY_CARE_PROVIDER_SITE_OTHER): Payer: PPO | Admitting: Internal Medicine

## 2016-03-23 ENCOUNTER — Telehealth (HOSPITAL_COMMUNITY): Payer: Self-pay

## 2016-03-23 ENCOUNTER — Encounter: Payer: Self-pay | Admitting: Internal Medicine

## 2016-03-23 ENCOUNTER — Other Ambulatory Visit: Payer: Self-pay | Admitting: *Deleted

## 2016-03-23 VITALS — BP 148/95 | HR 79 | Temp 98.2°F | Ht 62.0 in | Wt 148.6 lb

## 2016-03-23 DIAGNOSIS — K219 Gastro-esophageal reflux disease without esophagitis: Secondary | ICD-10-CM | POA: Diagnosis not present

## 2016-03-23 DIAGNOSIS — I82402 Acute embolism and thrombosis of unspecified deep veins of left lower extremity: Secondary | ICD-10-CM

## 2016-03-23 DIAGNOSIS — M7989 Other specified soft tissue disorders: Secondary | ICD-10-CM | POA: Diagnosis not present

## 2016-03-23 DIAGNOSIS — E785 Hyperlipidemia, unspecified: Secondary | ICD-10-CM | POA: Insufficient documentation

## 2016-03-23 DIAGNOSIS — Z7901 Long term (current) use of anticoagulants: Secondary | ICD-10-CM | POA: Diagnosis not present

## 2016-03-23 DIAGNOSIS — M19071 Primary osteoarthritis, right ankle and foot: Secondary | ICD-10-CM | POA: Insufficient documentation

## 2016-03-23 DIAGNOSIS — M25571 Pain in right ankle and joints of right foot: Secondary | ICD-10-CM

## 2016-03-23 DIAGNOSIS — I10 Essential (primary) hypertension: Secondary | ICD-10-CM | POA: Diagnosis not present

## 2016-03-23 DIAGNOSIS — J4520 Mild intermittent asthma, uncomplicated: Secondary | ICD-10-CM

## 2016-03-23 MED ORDER — APIXABAN 5 MG PO TABS: 5.0000 mg | ORAL_TABLET | Freq: Two times a day (BID) | ORAL | Status: DC

## 2016-03-23 NOTE — Telephone Encounter (Signed)
03/23/16 Kristen, PT asked that we cancel all appts for now per MD because of blood clots and once we got clearance from MD then we would reschedule her appts.  I called and left a message to let her know what and why we were cx appts.

## 2016-03-23 NOTE — Assessment & Plan Note (Addendum)
Pt with h/o DVT of the LLE in January.  It appears to have been provoked because she had a recent surgery.  However, she did not understand the importance of taking eliquis and only took it for a short time (about 1 month) according to her husband who accompanies her today.  She denies any dyspnea, chest pain.  However, she reports right ankle pain with no complaints in the LLE.  I sent her for a repeat doppler of both lower extremities and the LLE was found to have some residual clot. -will plan to restart eliquis for 3 months -requested Dr. Selena BattenKim discuss how to take her eliquis properly  -will obtain XR of the right foot since she was also complaining of this

## 2016-03-23 NOTE — Progress Notes (Signed)
Medication Samples have been provided to the patient.  Drug name: Eliquis (apixaban)       Strength: 5 mg        Qty: 28  LOT: [id number]  Exp.Date: May 2019  The patient has been instructed regarding the correct time, dose, and frequency of taking this medication, including desired effects and most common side effects.   Navi Ewton J 5:02 PM 03/23/2016

## 2016-03-23 NOTE — Telephone Encounter (Signed)
Walk in, pt comes in with her caregiver, sig other stating she needs to start back on "her blood thinners" and other medicines. Pt states she does not like taking pills and she does not take them sometimes because of that, she also states that she has a lot of pain, triage nurse explained to her that to reach her goal of feeling better, having less pain and being more independent that it is important for her to take her medication as directed because they work together to help her feel better and improve her health overall. She was agreeable to the above and wants to be seen today to get straightened out, she will wait for the only open appt today or if there are cancellations Ordered lunch trays, propped her swollen feet up on blankets

## 2016-03-23 NOTE — Progress Notes (Signed)
Patient ID: Nicole Baxter, female   DOB: Aug 04, 1941, 75 y.o.  MRN: [id number]     Subjective:   Patient ID: Nicole Baxter female    DOB: [birth year] 75 y.o.    MRN: [id number] Health Maintenance Due: Health Maintenance Due  Topic Date Due  . ZOSTAVAX  03/31/2003  . PNA vac Low Risk Adult (2 of 2 - PCV13) 09/29/2010  . MAMMOGRAM  07/11/2014  . COLON CANCER SCREENING ANNUAL FOBT  07/02/2015  . LIPID PANEL  12/25/2015  . FOOT EXAM  01/28/2016    _________________________________________________  HPI: [name] is a 75 y.o. female here for an acute visit to restart eliquis.  Pt has a PMH outlined below.  Please see problem-based charting assessment and plan for further status of patient's chronic medical problems addressed at today's visit.  PMH: Past Medical History  Diagnosis Date  . Obesity   . Hyperglycemia     isolated FBS 122, 95, 104 (Random 138)  . 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 Hyperlipidemia     on pravastatin  . Hypertension     controlled on 2 agents  . Parkinson's disease (tremor, stiffness, slow motion, unstable posture) (HCC)   . Tremor   . GERD (gastroesophageal reflux disease)     pt. states she no longer has GERD (09/11/15)  . Arthritis   . Shortness of breath dyspnea     pt. states that she no longer has SOB )09/11/15    Medications: Current Outpatient Prescriptions on File Prior to Visit  Medication Sig Dispense Refill  . acetaminophen (TYLENOL) 500 MG tablet Take 500 mg by mouth every 6 (six) hours as needed.    Marland Kitchen albuterol (PROVENTIL HFA;VENTOLIN HFA) 108 (90 Base) MCG/ACT inhaler Inhale 2 puffs into the lungs every 6 (six) hours as needed for wheezing or shortness of breath. 1 Inhaler 2  . apixaban (ELIQUIS) 5 MG TABS tablet Take 2 tablets (10 mg total) by mouth 2 (two) times daily. 28 tablet 0  . atorvastatin (LIPITOR) 40 MG tablet Take 1 tablet (40 mg total) by mouth daily. Patient requested easy open caps for all  medication bottles. 30 tablet 3  . bisacodyl (DULCOLAX) 5 MG EC tablet Take 1 tablet (5 mg total) by mouth daily as needed for moderate constipation. 30 tablet 0  . Calcium Carbonate-Vitamin D (CALCIUM 600/VITAMIN D) 600-400 MG-UNIT tablet Take 2 tablets by mouth daily. 180 tablet 1  . carbidopa-levodopa (SINEMET) 25-100 MG tablet Take 1 tablet by mouth 3 (three) times daily. Take 4 hours apart. 270 tablet 4  . hydrochlorothiazide (HYDRODIURIL) 12.5 MG tablet Take 1 tablet (12.5 mg total) by mouth daily. 90 tablet 1  . lisinopril (PRINIVIL,ZESTRIL) 20 MG tablet Take 1 tablet (20 mg total) by mouth daily. 90 tablet 1  . polyethylene glycol (MIRALAX / GLYCOLAX) packet Take 17 g by mouth daily.    . rivastigmine (EXELON) 4.5 MG capsule Take 1 capsule (4.5 mg total) by mouth 2 (two) times daily. 60 capsule 11  . rotigotine (NEUPRO) 2 MG/24HR Place 1 patch onto the skin daily. 30 patch 12  . sennosides-docusate sodium (SENOKOT-S) 8.6-50 MG tablet Take 2 tablets by mouth 2 (two) times daily. Reported on 12/18/2015     No current facility-administered medications on file prior to visit.    Allergies: No Known Allergies  FH: Family History  Problem Relation Age of Onset  . Heart disease Mother 26    died of  MI @ 3176  . Alcohol abuse Father   . Cancer Brother   . Colon cancer Neg Hx     SH: Social History   Social History  . Marital Status: Married    Spouse Name: N/A  . Number of Children: 5  . Years of Education: 4112   Social History Main Topics  . Smoking status: Never Smoker   . Smokeless tobacco: Never Used  . Alcohol Use: No  . Drug Use: No  . Sexual Activity: No   Other Topics Concern  . None   Social History Narrative   Worked at [employer] 31 years. Married.Active, out and about q day.   5 children, 1 with sarcoidosis, 1 with COPD.   Caffeine use: Drinks a 6 pack of soda per week    Review of Systems: Constitutional: Negative for fever, chills.  Eyes: Negative for  blurred vision.  Respiratory: Negative for cough and shortness of breath.  Cardiovascular: Negative for chest pain.  Gastrointestinal: Negative for nausea, vomiting. Neurological: Negative for dizziness.   Objective:   Vital Signs: Filed Vitals:   03/23/16 1409  BP: 148/95  Pulse: 79  Temp: 98.2 F (36.8 C)  TempSrc: Oral  Height: 5\' 2"  (1.575 m)  Weight: 148 lb 9.6 oz (67.405 kg)  SpO2: 100%      BP Readings from Last 3 Encounters:  03/23/16 148/95  02/01/16 162/100  12/18/15 104/60    Physical Exam: Constitutional: Vital signs reviewed.  Patient is in NAD and cooperative with exam.  Head: Normocephalic and atraumatic. Eyes: EOMI, conjunctivae nl, no scleral icterus.  Neck: Supple. Cardiovascular: RRR, no MRG. Pulmonary/Chest: normal effort, CTAB, no wheezes, rales, or rhonchi. Abdominal: Soft. NT/ND +BS. Neurological: A&O x3, cranial nerves II-XII are grossly intact, moving all extremities. Extremities: LLE edema>RLE.  No tenderness b/l.   Skin: Warm, dry and intact.    Assessment & Plan:   Assessment and plan was discussed and formulated with my attending.

## 2016-03-23 NOTE — Telephone Encounter (Signed)
Thank you.  At [facility] time[facility].

## 2016-03-23 NOTE — Progress Notes (Signed)
VASCULAR LAB PRELIMINARY  PRELIMINARY  PRELIMINARY  PRELIMINARY  Bilateral lower extremity venous duplex completed.    Preliminary report:  Right:  No evidence of DVT, superficial thrombosis, or Baker's cyst. Left: DVT noted Throughout the common femoral vein from the distal to the saphenofemoral junction to the confluence with the femoral vein. There has been recannaliztion of the vein since study of 11/2015  Previous DVT of the posterior tibial, peroneal, and popliteal veins appears to be resolved.  No evidence of superficial thrombosis.  No Baker's cyst.  Alvin Diffee, RVS 03/23/2016, 5:15 PM

## 2016-03-24 MED ORDER — ALBUTEROL SULFATE HFA 108 (90 BASE) MCG/ACT IN AERS: 2.0000 | INHALATION_SPRAY | Freq: Four times a day (QID) | RESPIRATORY_TRACT | Status: DC | PRN

## 2016-03-28 NOTE — Progress Notes (Signed)
Internal Medicine Clinic Attending  Case discussed with Dr. Gill soon after the resident saw the patient.  We reviewed the resident's history and exam and pertinent patient test results.  I agree with the assessment, diagnosis, and plan of care documented in the resident's note.  

## 2016-03-30 ENCOUNTER — Ambulatory Visit (HOSPITAL_COMMUNITY): Payer: PPO

## 2016-03-31 ENCOUNTER — Ambulatory Visit (HOSPITAL_COMMUNITY): Payer: PPO

## 2016-04-05 ENCOUNTER — Encounter: Payer: Self-pay | Admitting: Internal Medicine

## 2016-04-05 ENCOUNTER — Telehealth: Payer: Self-pay | Admitting: Internal Medicine

## 2016-04-05 ENCOUNTER — Telehealth (HOSPITAL_COMMUNITY): Payer: Self-pay | Admitting: Physical Therapy

## 2016-04-05 ENCOUNTER — Ambulatory Visit (HOSPITAL_COMMUNITY): Payer: PPO

## 2016-04-05 ENCOUNTER — Encounter (HOSPITAL_COMMUNITY): Payer: Self-pay | Admitting: Physical Therapy

## 2016-04-05 ENCOUNTER — Ambulatory Visit (INDEPENDENT_AMBULATORY_CARE_PROVIDER_SITE_OTHER): Payer: PPO | Admitting: Internal Medicine

## 2016-04-05 VITALS — BP 139/89 | HR 64 | Temp 98.4°F

## 2016-04-05 DIAGNOSIS — G20 Parkinson's disease: Secondary | ICD-10-CM | POA: Diagnosis not present

## 2016-04-05 DIAGNOSIS — Z1211 Encounter for screening for malignant neoplasm of colon: Secondary | ICD-10-CM

## 2016-04-05 DIAGNOSIS — Z0000 Encounter for general adult medical examination without abnormal findings: Secondary | ICD-10-CM

## 2016-04-05 DIAGNOSIS — M79671 Pain in right foot: Secondary | ICD-10-CM | POA: Diagnosis not present

## 2016-04-05 DIAGNOSIS — Z23 Encounter for immunization: Secondary | ICD-10-CM

## 2016-04-05 DIAGNOSIS — M25471 Effusion, right ankle: Secondary | ICD-10-CM | POA: Diagnosis not present

## 2016-04-05 DIAGNOSIS — M7661 Achilles tendinitis, right leg: Secondary | ICD-10-CM

## 2016-04-05 NOTE — Assessment & Plan Note (Signed)
She has pain directly over the inflamed achilles insertion of her right heel which makes me believe this is most likely achilles tendonitis. She also has some pain on dorsiflexion and has an effusion within the joint so she could have some retrocalcaneal bursitis as well. A foot x-ray last week showed some osteoarthritic changes but no fracture. Today I've recommended she continue taking acetaminophen 1000mg  every 6 hours as needed for pain, in addition to icing her ankle. I've referred her to podiatry to be considered for steroid injections.

## 2016-04-05 NOTE — Telephone Encounter (Signed)
Spoke with Baxter HireKristen from [facility]. She was requesting an order to resume services for the patient. She needs medical clearance. Thanks!

## 2016-04-05 NOTE — Telephone Encounter (Signed)
Called MD office to check in on patient status as previously discussed with RN a couple of weeks ago; left call-back number and requested that nurse please call back to discuss case.  Nedra HaiKristen Unger PT, DPT [phone]

## 2016-04-05 NOTE — Therapy (Signed)
[facility] [address] [place], [place], [address] Phone: [phone]   Fax:  [phone]  Patient Details  Name: Nicole Baxter MRN: [id number] Date of Birth: 10-18-1941 Referring Provider:  No ref. provider found  Encounter Date: 04/05/2016   PHYSICAL THERAPY DISCHARGE SUMMARY  Visits from Start of Care: 1  Current functional level related to goals / functional outcomes: Patient's course of care complicated by active blood clots for which she had stopped taking her blood thinners. Put patient on hold while MD worked with patient to sort out medication situation, however per today's MD note, she is now being referred to [place] services. Patient is more than welcome to return to skilled OP PT Parkinson's speciality services with new MD order once HHPT is complete.    Remaining deficits: No changes from initial evaluation    Education / Equipment: Called patient and educated regarding DC due to being placed in [place]; can return to skilled OP PT services with new MD order once HHPT is over with.  Plan: Patient agrees to discharge.  Patient goals were not met. Patient is being discharged due to not returning since the last visit.  ?????        Deniece Ree PT, DPT [facility] [address] [place], [place], [address] Phone: [phone]   Fax:  [phone]

## 2016-04-05 NOTE — Progress Notes (Signed)
Patient ID: Nicole Baxter, female   DOB: 05/03/1941, 75 y.o.   MRN: [id number] [facility] Subjective:   Patient ID: Nicole Baxter female   DOB: 08/03/1941 75 y.o.   MRN: [id number]  HPI: Nicole Baxter is a [age] y.o. female with Parkinson's disease and recent left lower extremity DVT here for evaluation of right foot pain.  Right foot pain: For the last 4 months, she has had pain over her right posterior achilles tendon. The pain is worse with walking and standing on her tip-toes. Acetaminophen has only helped mildly but ice seems to help quite a bit.  She is not smoking and I have reviewed her medications with her today.  Review of Systems  Constitutional: Negative for fever, chills and weight loss.  Respiratory: Negative for shortness of breath.   Cardiovascular: Positive for leg swelling.  Musculoskeletal: Positive for joint pain. Negative for myalgias.  Neurological: Positive for tremors. Negative for dizziness and loss of consciousness.   Objective:  Physical Exam: Filed Vitals:   04/05/16 0903  BP: 139/89  Pulse: 64  Temp: 98.4 F (36.9 C)  TempSrc: Oral  SpO2: 100%   General: friendly elderly lady resting in chair comfortably, appropriately conversational Cardiac: regular rate and rhythm, no rubs, murmurs or gallops Pulm: breathing well, clear to auscultation bilaterally Ext: right achilles tendon is enlarged and quite tender to palpation. The entire right ankle joint has a mild effusion. Her strength is 5/5 on dorsiflexion, plantarflexion, inversion, and eversion, with only mild pain on dorsiflexion. She does not have objective arthritis in her hands, shoulders, or knees. Skin: no rash, hair, or nail changes  Assessment & Plan:  Case discussed with Dr. Josem KaufmannKlima  Parkinsonism [facility](HCC) Her Parkinson's is improving with dopamine replacement. She'd like to work with the physical therapist at home again so I've placed the home health order.  Achilles  tendinitis of right lower extremity She has pain directly over the inflamed achilles insertion of her right heel which makes me believe this is most likely achilles tendonitis. She also has some pain on dorsiflexion and has an effusion within the joint so she could have some retrocalcaneal bursitis as well. A foot x-ray last week showed some osteoarthritic changes but no fracture. Today I've recommended she continue taking acetaminophen 1000mg  every 6 hours as needed for pain, in addition to icing her ankle. I've referred her to podiatry to be considered for steroid injections.   Other Orders Orders Placed This Encounter  Procedures  . Pneumococcal conjugate vaccine 13-valent IM  . Ambulatory referral to Podiatry    Referral Priority:  Routine    Referral Type:  Consultation    Referral Reason:  Specialty Services Required    Requested Specialty:  Podiatry    Number of Visits Requested:  1  . Ambulatory referral to Home Health    Referral Priority:  Routine    Referral Type:  Home Health Care    Referral Reason:  Specialty Services Required    Requested Specialty:  Home Health Services    Number of Visits Requested:  1   Follow Up: Return in about 3 months (around 07/06/2016) for general follow up with primary doctor.

## 2016-04-05 NOTE — Telephone Encounter (Signed)
Physical therapist needs to talk to nurse

## 2016-04-05 NOTE — Assessment & Plan Note (Signed)
She received her PCV 13 vaccine today and was provided FOBT cards. She declined getting a mammogram or Zostavax.

## 2016-04-05 NOTE — Telephone Encounter (Signed)
RN returned phone call; PT spoke to RN regarding if patient is now safe to return to PT due to blood clot situation. RN reports she is going to speak to another nurse who is more familiar with patient/ongoing situation  and return call.   Nicole HaiKristen Tallia Moehring PT, DPT [phone]

## 2016-04-05 NOTE — Telephone Encounter (Signed)
Reviewed patient's MD visit from today and noted that MD is now ordering HHPT services for patient. Called patient, explained that since she is going to be getting home health PT, that she will officially be discharged from skilled OP PT services at this time. Also educated that once she is done with HHPT, she is more than welcome to return to OP PT with new MD order for ongoing specialist work on her Parkinson's Disease. Patient stated verbal understanding of all education provided today.  Nedra HaiKristen Unger PT, DPT [phone]

## 2016-04-05 NOTE — Assessment & Plan Note (Signed)
Her Parkinson's is improving with dopamine replacement. She'd like to work with the physical therapist at home again so I've placed the home health order.

## 2016-04-07 ENCOUNTER — Ambulatory Visit (HOSPITAL_COMMUNITY): Payer: PPO

## 2016-04-07 NOTE — Progress Notes (Signed)
Case discussed with Dr. Flores at the time of the visit. We reviewed the resident's history and exam and pertinent patient test results. I agree with the assessment, diagnosis, and plan of care documented in the resident's note. 

## 2016-04-11 ENCOUNTER — Telehealth: Payer: Self-pay | Admitting: Licensed Clinical Social Worker

## 2016-04-11 NOTE — Addendum Note (Signed)
Addended by: Cicero DuckFLORES, Ciana Simmon H on: 04/11/2016 05:15 PM   Modules accepted: Orders

## 2016-04-11 NOTE — Telephone Encounter (Signed)
CSW placed call to [name] for home health PT referral and obtain pt's agency of choice.  Pt inquired what home health PT inquired.  CSW discussed HH PT much like outpatient PT but services are provided in the home.  [name] states "Oh, my schedule is busy.  I have a lot of doctor's appointments.  How much will it cost? My husband won't let anyone in the home.  Let me wait until I talk with the doctor at my appointment."  CSW inquired if pt wanted to [facility] which [facility] June [facility].  Pt would like to wait and speak with her podiatrist.  CSW discussed the option of returning to [facility].  [name] states she would like to stay in [place]. This telephone contact concludes with pt declining home health services and requesting to return to outpatient PT.  CSW notified physician.

## 2016-04-11 NOTE — Addendum Note (Signed)
Addended by: Neomia DearPOWERS, Darriel Sinquefield E on: 04/11/2016 02:57 PM   Modules accepted: Orders

## 2016-04-12 ENCOUNTER — Ambulatory Visit (HOSPITAL_COMMUNITY): Payer: PPO | Admitting: Physical Therapy

## 2016-04-13 ENCOUNTER — Other Ambulatory Visit (INDEPENDENT_AMBULATORY_CARE_PROVIDER_SITE_OTHER): Payer: PPO

## 2016-04-13 DIAGNOSIS — Z1211 Encounter for screening for malignant neoplasm of colon: Secondary | ICD-10-CM

## 2016-04-13 LAB — POC HEMOCCULT BLD/STL (HOME/3-CARD/SCREEN)
Card #2 Fecal Occult Blod, POC: NEGATIVE
FECAL OCCULT BLD: NEGATIVE
FECAL OCCULT BLD: NEGATIVE

## 2016-04-13 NOTE — Addendum Note (Signed)
Addended by: Bufford SpikesFULCHER, Koltyn Kelsay N on: 04/13/2016 12:03 PM   Modules accepted: Orders

## 2016-04-14 ENCOUNTER — Ambulatory Visit (HOSPITAL_COMMUNITY): Payer: PPO

## 2016-04-21 ENCOUNTER — Ambulatory Visit (INDEPENDENT_AMBULATORY_CARE_PROVIDER_SITE_OTHER): Payer: PPO

## 2016-04-21 ENCOUNTER — Encounter: Payer: Self-pay | Admitting: Podiatry

## 2016-04-21 ENCOUNTER — Ambulatory Visit (INDEPENDENT_AMBULATORY_CARE_PROVIDER_SITE_OTHER): Payer: PPO | Admitting: Podiatry

## 2016-04-21 VITALS — BP 151/92 | HR 69 | Resp 16

## 2016-04-21 DIAGNOSIS — M779 Enthesopathy, unspecified: Secondary | ICD-10-CM | POA: Diagnosis not present

## 2016-04-21 DIAGNOSIS — M7661 Achilles tendinitis, right leg: Secondary | ICD-10-CM | POA: Diagnosis not present

## 2016-04-21 MED ORDER — TRIAMCINOLONE ACETONIDE 10 MG/ML IJ SUSP
10.0000 mg | Freq: Once | INTRAMUSCULAR | Status: AC
  Administered 2016-04-21: 10 mg

## 2016-04-21 NOTE — Progress Notes (Signed)
   Subjective:    Patient ID: Nicole Baxter, female    DOB: 06/17/1941, 75 y.o.   MRN: [id number]  HPI Chief Complaint  Patient presents with  . Foot Pain    Right foot; back of heel; Since Jan. 2017      Review of Systems  All other systems reviewed and are negative.      Objective:   Physical Exam        Assessment & Plan:

## 2016-04-21 NOTE — Patient Instructions (Signed)

## 2016-04-21 NOTE — Progress Notes (Signed)
Subjective:     Patient ID: Nicole Baxter, female   DOB: 04/27/1941, 75 y.o.   MRN: [id number]  HPI patient presents with caregiver stating that the back of the right heel has been sore and making it hard to wear shoe gear comfortably. States it's been swollen and that it's been present for around 3 months   Review of Systems  All other systems reviewed and are negative.      Objective:   Physical Exam  Constitutional: She is oriented to person, place, and time.  Cardiovascular: Intact distal pulses.   Musculoskeletal: Normal range of motion.  Neurological: She is oriented to person, place, and time.  Skin: Skin is warm.  Nursing note and vitals reviewed.  neurovascular status intact muscle strength adequate range of motion within normal limits with inflammation and pain of the posterior lateral aspect of the Achilles at its insertion into the calcaneus. Central and medial side appears fine and there is normal muscle strength but the patient does have some reduction of her motion secondary to Parkinson's disease. Found to have good digital perfusion and well oriented 3     Assessment:     Inflammatory Achilles tendinitis lateral side right with the central and medial band doing well    Plan:     H&P and x-rays reviewed. At this point I discussed injection treatment and explain risk of rupture associated with this and she and her caregiver willing to undergo this. I did a careful lateral injection 3 mg Dexon some Kenalog 5 mg Xylocaine and advised on reduced activity and dispensed a ankle  brace with silicone to take pressure off the back of the heel. Reappoint 2 weeks or earlier if needed  X-ray report indicate spur formation with no indications of stress fracture or arthritis

## 2016-05-03 ENCOUNTER — Ambulatory Visit (HOSPITAL_COMMUNITY): Payer: PPO | Admitting: Physical Therapy

## 2016-05-10 ENCOUNTER — Telehealth: Payer: Self-pay

## 2016-05-10 NOTE — Telephone Encounter (Signed)
Nicole Baxter is a 75 y.o. female who was contacted via telephone for monitoring of apixaban (Eliquis) therapy.  No answer, message left and will call back.   ASSESSMENT Indication(s): DVT Duration: 3 months   Duane LopeHailey L Aberdeen Baxter PharmD Candidate  05/10/2016, 2:33 PM

## 2016-05-11 ENCOUNTER — Encounter: Payer: Self-pay | Admitting: *Deleted

## 2016-05-12 ENCOUNTER — Ambulatory Visit: Payer: PPO | Admitting: Internal Medicine

## 2016-05-13 NOTE — Telephone Encounter (Signed)
Randa NgoRuby L Schoon is a 75 y.o. female who was contacted via telephone for monitoring of apixaban (Eliquis) therapy.    ASSESSMENT Indication(s): DVT Duration: 3 months  Labs:    Component Value Date/Time   AST 13 10/14/2015 1453   ALT 5 10/14/2015 1453   NA 141 10/14/2015 1453   NA 137 09/26/2015 0542   K 4.5 10/14/2015 1453   CL 99 10/14/2015 1453   CO2 24 10/14/2015 1453   GLUCOSE 87 10/14/2015 1453   GLUCOSE 95 09/26/2015 0542   HGBA1C 5.6 10/14/2015 1538   HGBA1C 7.3 09/28/2010 1030   BUN 12 10/14/2015 1453   BUN 14 09/26/2015 0542   CREATININE 0.87 10/14/2015 1453   CREATININE 0.9 09/29/2015   CREATININE 0.73 03/02/2015 1037   CALCIUM 10.0 10/14/2015 1453   GFRNONAA 67 10/14/2015 1453   GFRNONAA 83 03/02/2015 1037   GFRAA 77 10/14/2015 1453   GFRAA >89 03/02/2015 1037   WBC 5.4 12/03/2015 1158   WBC 7.7 09/26/2015 0542   HGB 11.8* 09/29/2015   HCT 38.2 12/03/2015 1158   HCT 37 09/29/2015   PLT 196 12/03/2015 1158   PLT 325 09/29/2015    apixaban (Eliquis) Dose: 5 mg BID   Safety: Patient has not had recent bleeding/thromboembolic events. Patient reports no recent signs or symptoms of bleeding, no signs of symptoms of thromboembolism. Medication changes: no.  Adherence: Patient reports adherence challenges. Patient does not correctly recite the dose. Patient states she takes this once a day instead of BID like prescribed. Contacted pharmacy and records indicate refills are not consistent. First filled in 12/25/15 but never again until follow-up appointment and re-started for 3 months in 02/2016. Filled last time in 4/26.   Patient Instructions: Patient advised to contact clinic or seek medical attention if signs/symptoms of bleeding or thromboembolism occur. Patient verbalized understanding by repeating back information.  Follow-up Recommended labs to consider: CMP . Next appointment not scheduled.   Duane LopeHailey L Markees Carns PharmD Candidate  05/13/2016, 10:44 AM

## 2016-05-19 ENCOUNTER — Ambulatory Visit: Payer: PPO | Admitting: Pharmacist

## 2016-05-19 DIAGNOSIS — Z79899 Other long term (current) drug therapy: Secondary | ICD-10-CM

## 2016-05-19 NOTE — Progress Notes (Signed)
Medication Samples have been provided to the patient.  Drug name: Eliquis (apixaban)       Strength: 5 mg        Qty: 112 tablets  LOT: [id number]  Exp.Date: 11/2016  Dosing instructions: Take one tablet by mouth two times a day.  The patient has been instructed regarding the correct time, dose, and frequency of taking this medication, including desired effects and most common side effects.   Breshae Belcher L Patrice Moates 10:14 AM 05/19/2016

## 2016-05-19 NOTE — Progress Notes (Signed)
Patient was seen in clinic by Hailey Hill, PharmD candidate. I agree with the assessment and plan of care documented.  

## 2016-05-24 ENCOUNTER — Ambulatory Visit (HOSPITAL_COMMUNITY): Payer: PPO | Attending: Internal Medicine | Admitting: Physical Therapy

## 2016-05-24 DIAGNOSIS — R262 Difficulty in walking, not elsewhere classified: Secondary | ICD-10-CM | POA: Insufficient documentation

## 2016-05-24 DIAGNOSIS — Z9181 History of falling: Secondary | ICD-10-CM | POA: Insufficient documentation

## 2016-05-24 DIAGNOSIS — M6281 Muscle weakness (generalized): Secondary | ICD-10-CM | POA: Insufficient documentation

## 2016-05-24 DIAGNOSIS — R2681 Unsteadiness on feet: Secondary | ICD-10-CM | POA: Diagnosis not present

## 2016-05-24 DIAGNOSIS — M25671 Stiffness of right ankle, not elsewhere classified: Secondary | ICD-10-CM | POA: Diagnosis not present

## 2016-05-24 DIAGNOSIS — R293 Abnormal posture: Secondary | ICD-10-CM | POA: Diagnosis not present

## 2016-05-24 DIAGNOSIS — M25571 Pain in right ankle and joints of right foot: Secondary | ICD-10-CM

## 2016-05-24 NOTE — Therapy (Signed)
[facility] [address] [place], [place], [address] Phone: [phone]   Fax:  [phone]  Physical Therapy Evaluation  Patient Details  Name: Nicole NgoRuby L Baxter MRN: [id number] Date of Birth: 04/23/1941 Referring Provider: Kirtland BouchardK Flores/Alex Elson ClanM Wilson   Encounter Date: 05/24/2016      PT End of Session - 05/24/16 1136    Visit Number 1   Number of Visits 16   Date for PT Re-Evaluation 06/21/16   Authorization Type [employer]    Authorization Time Period 05/24/16 to 07/24/16   Authorization - Visit Number 1   Authorization - Number of Visits 10   PT Start Time 1032   PT Stop Time 1120   PT Time Calculation (min) 48 min   Activity Tolerance Patient tolerated treatment well   Behavior [facility] tasks assessed/performed      Past Medical History  Diagnosis Date  . Obesity   . Hyperglycemia     isolated FBS 122, 95, 104 (Random 138)  . 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 Hyperlipidemia     on pravastatin  . Hypertension     controlled on 2 agents  . Parkinson's disease (tremor, stiffness, slow motion, unstable posture) (HCC)   . Tremor   . GERD (gastroesophageal reflux disease)     pt. states she no longer has GERD (09/11/15)  . Arthritis   . Shortness of breath dyspnea     pt. states that she no longer has SOB )09/11/15    Past Surgical History  Procedure Laterality Date  . Colonoscopy    . Cesarean section    . Total knee arthroplasty Left 09/23/2015    Procedure: LEFT TOTAL KNEE ARTHROPLASTY;  Surgeon: Loreta Aveaniel F Murphy, MD;  [facility];  Service: Orthopedics;  Laterality: Left;    There were no vitals filed for this visit.       Subjective Assessment - 05/24/16 1041    Subjective Patient returns after having worked with her MD to get her medications, specifically eliquis, straightned out. The most major changes she has had was the pain in her achilles area, which started a  couple of months ago. They went to a foot doctor for it and got a shot, but patient reoprts that this only caused more pain. In temrs of the Parkinsons they have noticed that she has been having more festination. Two falls recently, she was trying to get up out of chair and lost her balance.    Pertinent History blood thinner (now currently taking per patient- will check with MD), HTN, GERD, DM, Parkinson, OA, SOB, hx of acute blood clot in January 2017; history of macular degeneration   Patient Stated Goals walking better, better mobility    Currently in Pain? Yes   Pain Score 5    Pain Location Heel   Pain Orientation Right   Pain Descriptors / Indicators Sharp;Pins and needles   Pain Type Chronic pain   Pain Radiating Towards none    Pain Onset More than a month ago   Pain Frequency Constant   Aggravating Factors  standing and walking, also sitting too long    Pain Relieving Factors pain pills    Effect of Pain on Daily Activities none             [facility] PT Assessment - 05/24/16 0001    Assessment   Medical Diagnosis parkinsons and achilles tendonitis    Referring  Provider K Flores/Alex Elson ClanM Wilson    Onset Date/Surgical Date --  chronic    Next MD Visit no followups    Precautions   Precaution Comments Eliquis, fall risk    Balance Screen   Has the patient fallen in the past 6 months Yes   How many times? 2   Has the patient had a decrease in activity level because of a fear of falling?  Yes   Is the patient reluctant to leave their home because of a fear of falling?  Yes   Prior Function   Level of Independence Needs assistance with transfers;Needs assistance with homemaking;Needs assistance with ADLs;Needs assistance with gait   Vocation Retired   Leisure none    Observation/Other Assessments   Observations resting tremor noted, poor core strength as well in standing and sitting    Posture/Postural Control   Posture/Postural Control Postural limitations   Postural  Limitations Rounded Shoulders;Forward head;Decreased thoracic kyphosis;Flexed trunk  classic flexed Parkinsonian posture    AROM   Right Ankle Dorsiflexion 9   Right Ankle Plantar Flexion 50   Right Ankle Inversion 30   Right Ankle Eversion 10   Strength   Right Hip Flexion 3+/5   Right Hip ABduction 2/5  hip flexor compensation    Left Hip Flexion 3/5   Left Hip ABduction 2/5  hip flexor compensation    Right Knee Extension 4/5   Left Knee Extension 4/5   Right Ankle Dorsiflexion 3/5   Right Ankle Inversion 4-/5   Right Ankle Eversion 2/5   Left Ankle Dorsiflexion 4/5   Left Ankle Inversion 4/5   Left Ankle Eversion 3/5   Bed Mobility   Rolling Right 4: Min assist   Rolling Left 3: Mod assist   Supine to Sit 4: Min assist   Sit to Supine 2: Max assist   Transfers   Sit to Stand 4: Min guard;3: Mod assist   Sit to Stand Details --  mod from low surfaces    Stand to Sit 4: Min guard   Ambulation/Gait   Gait Comments noted festination, tendency to walk on toes, reduced gait speed, unsteadiness, reduced BOS, classic parkinsonian gait with occasional freezing episodes    High Level Balance   High Level Balance Comments SLS 0-3 seconds each LE at best                            PT Education - 05/24/16 1135    Education provided Yes   Education Details all noted signs/symptoms are classic for Parkisnons and achilles tendonitis; giving ankle HEP, will progress to PD HEP as form improves; prognosis, HEP, POC   Person(s) Educated Patient;Spouse   Methods Explanation   Comprehension Verbalized understanding;Need further instruction          PT Short Term Goals - 05/24/16 1147    PT SHORT TERM GOAL #1   Title Patient to be able to complete all bed mobility with Min assist in order to demonstrate improved mobility and need for less assistance from family at home    Time 4   Period Weeks   Status New   PT SHORT TERM GOAL #2   Title Patient to be able to  complete all stand to sit/sit to stand transfers from various height surfaces with Min assist in order to demonstrate improved mobility and need for less assistance from family at home    Time 4  Period Weeks   Status New   PT SHORT TERM GOAL #3   Title Patient to demonstrate consistent improvements in gait pattern with LRAD, including minimal festination, increased step lengths/stance times, improved posture, and minimal instabilty in order to improve moblity at home and in community    Time 4   Period Weeks   Status New   PT SHORT TERM GOAL #4   Title Patient to demonstrate improved posture at least 70% of the time  in order to improve balance and overall mechanics of mobility    Time 4   Period Weeks   Status New   PT SHORT TERM GOAL #5   Title Patient to experience R ankle pain no more than 2/10 in order to improve QOL and increase tolerance of weight bearing activities    Time 4   Period Weeks   Status New   Additional Short Term Goals   Additional Short Term Goals Yes   PT SHORT TERM GOAL #6   Title Patient to be independent in correctly and consistently performing appropriate HEP, to be updated PRN    Time 4   Period Weeks   Status New           PT Long Term Goals - 05/24/16 1152    PT LONG TERM GOAL #1   Title Patient to be able to complete all bed mobility and sit to stand/stand to sit transfers with LRAD and Mod(I) in order to demonstrate improved mobility and overall QOL    Time 8   Period Weeks   Status New   PT LONG TERM GOAL #2   Title Patient to be able to ambulate for at least 20 minutes with mechanics WFL, LRAD, pain no more than 1/10 in order to demonstrate improved mobilty and overall QOL    Time 8   Period Weeks   Status New   PT LONG TERM GOAL #3   Title Patient to demonstrate strength 4/5 in all tested muscle groups in order to assist in improving mobiltiy, reducing pain, and improving balance    Time 8   Period Weeks   Status New   PT LONG TERM  GOAL #4   Title Patient to demonstrate full R ankle ROM and experience no more than occasional R ankle pain 1/10 in order to improve tolerance to mobility and assist in improving QOL    Time 8   Period Weeks   Status New   PT LONG TERM GOAL #5   Title Patient to be participatory in regular exercise at least 20 minutes in duration, of moderate intensity, at least 4 days per week, in order to maintain functional gains and assist in managing parkinsonian symptoms    Time 8   Period Weeks   Status New   Additional Long Term Goals   Additional Long Term Goals Yes   PT LONG TERM GOAL #6   Title Patient to report no falls within the past 4 weeks in order to demonstrate improved safety at home and within community    Time 8   Period Weeks   Status New               Plan - 05/24/16 1137    Clinical Impression Statement Patient arrives today stating that she has been working with MD to get her Eliquis more in check; she reports that her Parkinsons symptoms are mostly the same however she is having increased pain in her R heel area especially  with standing and walking. She has had multiple falls recently, which she states are from her just losing her balance getting up out of a chair or the couch. Upon examination, patient reveals classic Parkinsonian gait pattern, with festination forwards with quick small steps/very small stance times/step lengths and tendency to walk on her toes; she also displays significant postural deficits, functional muscle weakness, and unsteadiness, all of which appear to be directly related to Parkinsonism. She does display some tenderness of R Achilles area, as well as some R ankle stiffness and noted gait impairment. At this time suspect that development of Achilles tendonitis may be related to Parkinsons gait pattern and postual impairment, and suspect that this will improve with Parkinsons specific treatments, however will also incorporate achillles tendonitis  specific interventions moving forward. At this point recommend skilled PT services to address functional limitations, to assist in reaching optimal level of function, and to reduce overall fall risk.    Rehab Potential Good   PT Frequency 2x / week   PT Duration 8 weeks   PT Treatment/Interventions ADLs/Self Care Home Management;Biofeedback;Gait training;Stair training;Functional mobility training;Therapeutic activities;Therapeutic exercise;Balance training;Neuromuscular re-education;Patient/family education;Manual techniques;Passive range of motion;Energy conservation;Taping   PT Next Visit Plan review HEP and goals; functional stretching and ankle exercise; PWR training, gait and balance training. Follow up on information about home aide and a place to get rollator repaired.    PT Home Exercise Plan ankle circles, achillles/hamstring stretch    Recommended Other Services potential PWR OT for Parkinsons    Consulted and Agree with Plan of Care Patient;Family member/caregiver   Family Member Consulted spouse       Patient will benefit from skilled therapeutic intervention in order to improve the following deficits and impairments:  Abnormal gait, Decreased endurance, Hypomobility, Impaired tone, Decreased activity tolerance, Decreased strength, Pain, Decreased balance, Decreased mobility, Difficulty walking, Decreased range of motion, Improper body mechanics, Decreased coordination, Decreased safety awareness, Impaired flexibility, Postural dysfunction, Decreased knowledge of use of DME  Visit Diagnosis: Unsteadiness on feet - Plan: PT plan of care cert/re-cert  Difficulty in walking, not elsewhere classified - Plan: PT plan of care cert/re-cert  History of falling - Plan: PT plan of care cert/re-cert  Muscle weakness (generalized) - Plan: PT plan of care cert/re-cert  Abnormal posture - Plan: PT plan of care cert/re-cert  Pain in right ankle and joints of right foot - Plan: PT plan of care  cert/re-cert  Stiffness of right ankle, not elsewhere classified - Plan: PT plan of care cert/re-cert      G-Codes - 2016-05-26 1157    Functional Assessment Tool Used Based on skilled clinical assessment of gait, functional mobility, functional balance,strength, safety awareness, ROM    Functional Limitation Mobility: Walking and moving around   Mobility: Walking and Moving Around Current Status (Z6109) At least 60 percent but less than 80 percent impaired, limited or restricted   Mobility: Walking and Moving Around Goal Status (U0454) At least 40 percent but less than 60 percent impaired, limited or restricted       Problem List Patient Active Problem List   Diagnosis Date Noted  . Achilles tendinitis of right lower extremity 04/05/2016  . Hypertensive retinopathy of both eyes, grade 2 01/15/2016  . Left leg DVT (HCC) 12/18/2015  . 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 Diverticulosis 03/10/2015  . Internal hemorrhoids 03/10/2015  . History of colon polyps 12/25/2014  . Health care maintenance 02/13/2014  . Subclinical hyperthyroidism 12/25/2013  . Kidney  mass 06/11/2012  . NSAID-associated gastropathy 06/06/2012  . Osteoarthritis of both knees 03/15/2012  . Parkinsonism (HCC) 07/13/2010  . Diabetes mellitus type 2, diet-controlled (HCC) 05/22/2009  . ALLERGIC RHINITIS, SEASONAL 02/20/2007  . Hyperlipidemia 09/27/2006  . Essential hypertension 09/27/2006  . GERD 09/27/2006    Nedra Hai PT, DPT [id number]  [facility] [address] [place], [place], [address] Phone: [phone]   Fax:  [phone]  Name: Nicole Baxter MRN: [id number] Date of Birth: 1941-04-12

## 2016-05-24 NOTE — Patient Instructions (Signed)
   ANKLE CIRCLES  Move your ankle in a circular pattern clockwise 10-15 times, and then counterclockwise  10-15 tmes.  Repeat 2-3 times per day.    Seated Hamstring Stretch  Start: Seated on bed or table as pictured, place a towel or strap around the ball of your foot.   Movement: Pull on the strap and slightly lean into the pull for a stretch on the back of the leg and in your calf.   Hold for 30 seconds and repeat at least 3 times, 2-3 times per day.

## 2016-06-01 ENCOUNTER — Ambulatory Visit (HOSPITAL_COMMUNITY): Payer: PPO | Attending: Internal Medicine | Admitting: Physical Therapy

## 2016-06-01 DIAGNOSIS — M6281 Muscle weakness (generalized): Secondary | ICD-10-CM

## 2016-06-01 DIAGNOSIS — M25671 Stiffness of right ankle, not elsewhere classified: Secondary | ICD-10-CM | POA: Insufficient documentation

## 2016-06-01 DIAGNOSIS — R262 Difficulty in walking, not elsewhere classified: Secondary | ICD-10-CM | POA: Diagnosis not present

## 2016-06-01 DIAGNOSIS — M25571 Pain in right ankle and joints of right foot: Secondary | ICD-10-CM | POA: Insufficient documentation

## 2016-06-01 DIAGNOSIS — R2681 Unsteadiness on feet: Secondary | ICD-10-CM | POA: Diagnosis not present

## 2016-06-01 DIAGNOSIS — Z9181 History of falling: Secondary | ICD-10-CM

## 2016-06-01 DIAGNOSIS — R293 Abnormal posture: Secondary | ICD-10-CM | POA: Insufficient documentation

## 2016-06-01 NOTE — Therapy (Signed)
[facility] [address] [place], [place], [address] Phone: [phone]   Fax:  [phone]  Physical Therapy Treatment  Patient Details  Name: Nicole Baxter MRN: [id number] Date of Birth: 05/04/1941 Referring Provider: Kirtland Bouchard Flores/Alex Elson Clan   Encounter Date: 06/01/2016      PT End of Session - 06/01/16 1245    Visit Number 2   Number of Visits 16   Date for PT Re-Evaluation 06/21/16   Authorization Type [employer]    Authorization Time Period 05/24/16 to 07/24/16   Authorization - Visit Number 2   Authorization - Number of Visits 10   PT Start Time 1033   PT Stop Time 1116   PT Time Calculation (min) 43 min   Activity Tolerance Patient tolerated treatment well   Behavior During Therapy [facility] for tasks assessed/performed      Past Medical History  Diagnosis Date  . Obesity   . Hyperglycemia     isolated FBS 122, 95, 104 (Random 138)  . 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 Hyperlipidemia     on pravastatin  . Hypertension     controlled on 2 agents  . Parkinson's disease (tremor, stiffness, slow motion, unstable posture) (HCC)   . Tremor   . GERD (gastroesophageal reflux disease)     pt. states she no longer has GERD (09/11/15)  . Arthritis   . Shortness of breath dyspnea     pt. states that she no longer has SOB )09/11/15    Past Surgical History  Procedure Laterality Date  . Colonoscopy    . Cesarean section    . Total knee arthroplasty Left 09/23/2015    Procedure: LEFT TOTAL KNEE ARTHROPLASTY;  Surgeon: Loreta Ave, MD;  Location: [facility] OR;  Service: Orthopedics;  Laterality: Left;    There were no vitals filed for this visit.      Subjective Assessment - 06/01/16 1241    Subjective Patietn arrives today stating she is doing well hwoever is still having pain in her heel specifically; no major changes otehrwise    Patient is accompained by: Family member   Pertinent History blood  thinner (now currently taking per patient- will check with MD), HTN, GERD, DM, Parkinson, OA, SOB, hx of acute blood clot in January 2017; history of macular degeneration   Patient Stated Goals walking better, better mobility    Currently in Pain? Yes   Pain Score 8    Pain Location Heel   Pain Orientation Right   Pain Descriptors / Indicators Sharp   Pain Type Chronic pain   Pain Radiating Towards none    Pain Onset More than a month ago   Pain Frequency Constant   Aggravating Factors  standing, walking, also sitting too long    Pain Relieving Factors pain pills    Effect of Pain on Daily Activities none                          [facility] Adult PT Treatment/Exercise - 06/01/16 0001    Bed Mobility   Supine to Sit 4: Min assist   Sit to Supine 3: Mod assist   Exercises   Exercises Ankle   Ankle Exercises: Stretches   Gastroc Stretch 3 reps;30 seconds   Other Stretch 4 way ankle strenngth 1x10 with red TB            PWR ([facility]) -  06/01/16 1244    PWR! Up 1x10 mod cues supine    PWR! Rock 1x12 mod cues supine    PWR! Step 1x10 mod cues supine    PWR! Up 1x10 sitting   PWR! Rock 1x10 sitting    PWR! Step 1x10 sitting    Comments hurdles and cones, mod cues              PT Education - 06/01/16 1245    Education provided Yes   Education Details sit to stand sequence training    Person(s) Educated Patient;Spouse   Methods Explanation   Comprehension Verbalized understanding          PT Short Term Goals - 05/24/16 1147    PT SHORT TERM GOAL #1   Title Patient to be able to complete all bed mobility with Min assist in order to demonstrate improved mobility and need for less assistance from family at home    Time 4   Period Weeks   Status New   PT SHORT TERM GOAL #2   Title Patient to be able to complete all stand to sit/sit to stand transfers from various height surfaces with Min assist in order to demonstrate improved mobility and need for less  assistance from family at home    Time 4   Period Weeks   Status New   PT SHORT TERM GOAL #3   Title Patient to demonstrate consistent improvements in gait pattern with LRAD, including minimal festination, increased step lengths/stance times, improved posture, and minimal instabilty in order to improve moblity at home and in community    Time 4   Period Weeks   Status New   PT SHORT TERM GOAL #4   Title Patient to demonstrate improved posture at least 70% of the time  in order to improve balance and overall mechanics of mobility    Time 4   Period Weeks   Status New   PT SHORT TERM GOAL #5   Title Patient to experience R ankle pain no more than 2/10 in order to improve QOL and increase tolerance of weight bearing activities    Time 4   Period Weeks   Status New   Additional Short Term Goals   Additional Short Term Goals Yes   PT SHORT TERM GOAL #6   Title Patient to be independent in correctly and consistently performing appropriate HEP, to be updated PRN    Time 4   Period Weeks   Status New           PT Long Term Goals - 05/24/16 1152    PT LONG TERM GOAL #1   Title Patient to be able to complete all bed mobility and sit to stand/stand to sit transfers with LRAD and Mod(I) in order to demonstrate improved mobility and overall QOL    Time 8   Period Weeks   Status New   PT LONG TERM GOAL #2   Title Patient to be able to ambulate for at least 20 minutes with mechanics WFL, LRAD, pain no more than 1/10 in order to demonstrate improved mobilty and overall QOL    Time 8   Period Weeks   Status New   PT LONG TERM GOAL #3   Title Patient to demonstrate strength 4/5 in all tested muscle groups in order to assist in improving mobiltiy, reducing pain, and improving balance    Time 8   Period Weeks   Status New   PT LONG TERM  GOAL #4   Title Patient to demonstrate full R ankle ROM and experience no more than occasional R ankle pain 1/10 in order to improve tolerance to  mobility and assist in improving QOL    Time 8   Period Weeks   Status New   PT LONG TERM GOAL #5   Title Patient to be participatory in regular exercise at least 20 minutes in duration, of moderate intensity, at least 4 days per week, in order to maintain functional gains and assist in managing parkinsonian symptoms    Time 8   Period Weeks   Status New   Additional Long Term Goals   Additional Long Term Goals Yes   PT LONG TERM GOAL #6   Title Patient to report no falls within the past 4 weeks in order to demonstrate improved safety at home and within community    Time 8   Period Weeks   Status New               Plan - 06/01/16 1246    Clinical Impression Statement Focused todays' session on PWR moves in supine and sitting today, with mod cues for form with PWR moves today. Noted significant difficulty with trunk extension and lateral weight shifting as well as trunk rotations  as well, likely due to parkinsonian based rigidity and difficulty with coordination based activities. Did perform some treatments for ankle as well today, as patient does continue to complain of pain in this area. Limited in extent of activities by slow movement patterns and need for cues throughout today's session however patient did well overall.    Rehab Potential Good   PT Frequency 2x / week   PT Duration 8 weeks   PT Treatment/Interventions ADLs/Self Care Home Management;Biofeedback;Gait training;Stair training;Functional mobility training;Therapeutic activities;Therapeutic exercise;Balance training;Neuromuscular re-education;Patient/family education;Manual techniques;Passive range of motion;Energy conservation;Taping   PT Next Visit Plan review HEP and goals; functional stretching and ankle exercise; PWR training, gait and balance training. Follow up on information about home aide and a place to get rollator repaired.    Consulted and Agree with Plan of Care Patient;Family member/caregiver   Family  Member Consulted spouse       Patient will benefit from skilled therapeutic intervention in order to improve the following deficits and impairments:  Abnormal gait, Decreased endurance, Hypomobility, Impaired tone, Decreased activity tolerance, Decreased strength, Pain, Decreased balance, Decreased mobility, Difficulty walking, Decreased range of motion, Improper body mechanics, Decreased coordination, Decreased safety awareness, Impaired flexibility, Postural dysfunction, Decreased knowledge of use of DME  Visit Diagnosis: Unsteadiness on feet  Difficulty in walking, not elsewhere classified  History of falling  Muscle weakness (generalized)  Abnormal posture  Pain in right ankle and joints of right foot  Stiffness of right ankle, not elsewhere classified     Problem List Patient Active Problem List   Diagnosis Date Noted  . Achilles tendinitis of right lower extremity 04/05/2016  . Hypertensive retinopathy of both eyes, grade 2 01/15/2016  . Left leg DVT (HCC) 12/18/2015  . 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 Diverticulosis 03/10/2015  . Internal hemorrhoids 03/10/2015  . History of colon polyps 12/25/2014  . Health care maintenance 02/13/2014  . Subclinical hyperthyroidism 12/25/2013  . Kidney mass 06/11/2012  . NSAID-associated gastropathy 06/06/2012  . Osteoarthritis of both knees 03/15/2012  . Parkinsonism (HCC) 07/13/2010  . Diabetes mellitus type 2, diet-controlled (HCC) 05/22/2009  . ALLERGIC RHINITIS, SEASONAL 02/20/2007  . Hyperlipidemia 09/27/2006  . Essential hypertension 09/27/2006  .  GERD 09/27/2006    Nedra Hai PT, DPT [id number]  [facility] [address] [place], [place], [address] Phone: [phone]   Fax:  [phone]  Name: Nicole Baxter MRN: [id number] Date of Birth: [birth year]

## 2016-06-02 ENCOUNTER — Ambulatory Visit: Payer: PPO | Admitting: Adult Health

## 2016-06-06 ENCOUNTER — Encounter: Payer: Self-pay | Admitting: Adult Health

## 2016-06-08 ENCOUNTER — Ambulatory Visit (HOSPITAL_COMMUNITY): Payer: PPO | Admitting: Physical Therapy

## 2016-06-08 DIAGNOSIS — R262 Difficulty in walking, not elsewhere classified: Secondary | ICD-10-CM

## 2016-06-08 DIAGNOSIS — M25671 Stiffness of right ankle, not elsewhere classified: Secondary | ICD-10-CM

## 2016-06-08 DIAGNOSIS — M25571 Pain in right ankle and joints of right foot: Secondary | ICD-10-CM

## 2016-06-08 DIAGNOSIS — R2681 Unsteadiness on feet: Secondary | ICD-10-CM

## 2016-06-08 DIAGNOSIS — Z9181 History of falling: Secondary | ICD-10-CM

## 2016-06-08 DIAGNOSIS — M6281 Muscle weakness (generalized): Secondary | ICD-10-CM

## 2016-06-08 DIAGNOSIS — R293 Abnormal posture: Secondary | ICD-10-CM

## 2016-06-08 NOTE — Therapy (Signed)
[facility] [address] [place], [place], [address] Phone: [phone]   Fax:  [phone]  Physical Therapy Treatment  Patient Details  Name: Nicole Baxter MRN: [id number] Date of Birth: 11/05/1941 Referring Provider: Kirtland BouchardK Flores/Alex Elson ClanM Wilson   Encounter Date: 06/08/2016      PT End of Session - 06/08/16 0952    Visit Number 3   Number of Visits 16   Date for PT Re-Evaluation 06/21/16   Authorization Type [employer]    Authorization Time Period 05/24/16 to 07/24/16   Authorization - Visit Number 3   Authorization - Number of Visits 10   PT Start Time 0905   PT Stop Time 0945   PT Time Calculation (min) 40 min   Activity Tolerance Patient tolerated treatment well   Behavior [facility] for tasks assessed/performed      Past Medical History  Diagnosis Date  . Obesity   . Hyperglycemia     isolated FBS 122, 95, 104 (Random 138)  . 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 Hyperlipidemia     on pravastatin  . Hypertension     controlled on 2 agents  . Parkinson's disease (tremor, stiffness, slow motion, unstable posture) (HCC)   . Tremor   . GERD (gastroesophageal reflux disease)     pt. states she no longer has GERD (09/11/15)  . Arthritis   . Shortness of breath dyspnea     pt. states that she no longer has SOB )09/11/15    Past Surgical History  Procedure Laterality Date  . Colonoscopy    . Cesarean section    . 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      Subjective Assessment - 06/08/16 0922    Subjective Patient arrives today stating that her R foot is more swollen, she has had some increased pain in her heel the past couple of days. Nothing else major going on. She reports that she is still taking the  Eliquis as prescribed.    Patient is accompained by: Family member   Pertinent History blood thinner (now currently taking per patient- will check with MD), HTN, GERD, DM, Parkinson, OA, SOB, hx of acute blood clot in January 2017; history of macular degeneration   Patient Stated Goals walking better, better mobility    Currently in Pain? Yes   Pain Score 5    Pain Location Heel   Pain Orientation Right   Pain Descriptors / Indicators Sharp   Pain Type Chronic pain   Pain Radiating Towards none    Pain Onset More than a month ago   Pain Frequency Intermittent   Aggravating Factors  trying to walk    Pain Relieving Factors pain pills    Effect of Pain on Daily Activities limits mobilty             [facility] - 06/08/16 0001    Observation/Other Assessments   Observations increased edema dorsal L foot and ankle with increased temperature; however edema did appear to decrease shortly after shoe was removed, as did temperature and edema did not extend to calf of LE. Patient reports she is still on eliquis. Will continue to monitor, but does not appear to be clot related at this  time.                      [facility] Adult PT Treatment/Exercise - 06/08/16 0001    Ambulation/Gait   Ambulation/Gait Yes   Ambulation Distance (Feet) 65 Feet   Assistive device Rollator   Gait Comments cues for step length and heel-toe pattern            PWR [facility]) - 06/08/16 0924    PWR! Up 1x8 in sitting, mod cues    PWR! Rock 1x10, sitting,mod cues, cones    PWR! Twist 1x10, sitting, mod cues    PWR! Step 1s10, sitting              PT Education - 06/08/16 1037    Education provided Yes   Education Details keep working on heel-toe pattern durign gait; significant of acute edema in relation to possible clot formation, importance of consistently taking blood thinners as prescribed by MD, likelihood that this is not clot related today due to patient reports of adherence to  Eliquis and immediate improvement of area with removal of shoe    Person(s) Educated Patient;Spouse   Methods Explanation   Comprehension Verbalized understanding          PT Short Term Goals - 05/24/16 1147    PT SHORT TERM GOAL #1   Title Patient to be able to complete all bed mobility with Min assist in order to demonstrate improved mobility and need for less assistance from family at home    Time 4   Period Weeks   Status New   PT SHORT TERM GOAL #2   Title Patient to be able to complete all stand to sit/sit to stand transfers from various height surfaces with Min assist in order to demonstrate improved mobility and need for less assistance from family at home    Time 4   Period Weeks   Status New   PT SHORT TERM GOAL #3   Title Patient to demonstrate consistent improvements in gait pattern with LRAD, including minimal festination, increased step lengths/stance times, improved posture, and minimal instabilty in order to improve moblity at home and in community    Time 4   Period Weeks   Status New   PT SHORT TERM GOAL #4   Title Patient to demonstrate improved posture at least 70% of the time  in order to improve balance and overall mechanics of mobility    Time 4   Period Weeks   Status New   PT SHORT TERM GOAL #5   Title Patient to experience R ankle pain no more than 2/10 in order to improve QOL and increase tolerance of weight bearing activities    Time 4   Period Weeks   Status New   Additional Short Term Goals   Additional Short Term Goals Yes   PT SHORT TERM GOAL #6   Title Patient to be independent in correctly and consistently performing appropriate HEP, to be updated PRN    Time 4   Period Weeks   Status New           PT Long Term Goals - 05/24/16 1152    PT LONG TERM GOAL #1   Title Patient to be able to complete all bed mobility and sit to stand/stand to sit transfers with LRAD and Mod(I) in order to demonstrate improved mobility and overall QOL     Time 8   Period Weeks   Status New  PT LONG TERM GOAL #2   Title Patient to be able to ambulate for at least 20 minutes with mechanics WFL, LRAD, pain no more than 1/10 in order to demonstrate improved mobilty and overall QOL    Time 8   Period Weeks   Status New   PT LONG TERM GOAL #3   Title Patient to demonstrate strength 4/5 in all tested muscle groups in order to assist in improving mobiltiy, reducing pain, and improving balance    Time 8   Period Weeks   Status New   PT LONG TERM GOAL #4   Title Patient to demonstrate full R ankle ROM and experience no more than occasional R ankle pain 1/10 in order to improve tolerance to mobility and assist in improving QOL    Time 8   Period Weeks   Status New   PT LONG TERM GOAL #5   Title Patient to be participatory in regular exercise at least 20 minutes in duration, of moderate intensity, at least 4 days per week, in order to maintain functional gains and assist in managing parkinsonian symptoms    Time 8   Period Weeks   Status New   Additional Long Term Goals   Additional Long Term Goals Yes   PT LONG TERM GOAL #6   Title Patient to report no falls within the past 4 weeks in order to demonstrate improved safety at home and within community    Time 8   Period Weeks   Status New               Plan - 06/08/16 1027    Clinical Impression Statement Patient arrived reporting increased edema in her R foot, which comes and goes depending on which shoes she is wearing; she reports she is still taking Eliquis as prescribed by MD. Patient did demonstrate significant edema and slight increased temperature but this did improve when shoe was removed  and edema  does not appear to extend into calf at this point. Due to systemic presence of Eliquis and immediate improvement of symptoms after shoe removal, do not suspect edema is related to clot at this time. Focused on seated PWR exercises today with mod cues for form, patient displays  improved functional sit to stand/stand to sit transfers today as well. Finished session with gait training for step length and heel-toe pattern in order to improve mechanics and reduce stress on distal Achilles tendon junction; encouraged patient and husband to practice this at home.    Rehab Potential Good   PT Frequency 2x / week   PT Duration 8 weeks   PT Treatment/Interventions ADLs/Self Care Home Management;Biofeedback;Gait training;Stair training;Functional mobility training;Therapeutic activities;Therapeutic exercise;Balance training;Neuromuscular re-education;Patient/family education;Manual techniques;Passive range of motion;Energy conservation;Taping   PT Next Visit Plan review HEP and goals; functional stretching and ankle exercise; PWR training, gait and balance training.     PT Home Exercise Plan updated heel toe gait    Consulted and Agree with Plan of Care Patient;Family member/caregiver   Family Member Consulted spouse       Patient will benefit from skilled therapeutic intervention in order to improve the following deficits and impairments:  Abnormal gait, Decreased endurance, Hypomobility, Impaired tone, Decreased activity tolerance, Decreased strength, Pain, Decreased balance, Decreased mobility, Difficulty walking, Decreased range of motion, Improper body mechanics, Decreased coordination, Decreased safety awareness, Impaired flexibility, Postural dysfunction, Decreased knowledge of use of DME  Visit Diagnosis: Unsteadiness on feet  Difficulty in walking, not elsewhere classified  History of falling  Muscle weakness (generalized)  Abnormal posture  Pain in right ankle and joints of right foot  Stiffness of right ankle, not elsewhere classified     Problem List Patient Active Problem List   Diagnosis Date Noted  . Achilles tendinitis of right lower extremity 04/05/2016  . Hypertensive retinopathy of both eyes, grade 2 01/15/2016  . Left leg DVT (HCC) 12/18/2015   . 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 Diverticulosis 03/10/2015  . Internal hemorrhoids 03/10/2015  . History of colon polyps 12/25/2014  . Health care maintenance 02/13/2014  . Subclinical hyperthyroidism 12/25/2013  . Kidney mass 06/11/2012  . NSAID-associated gastropathy 06/06/2012  . Osteoarthritis of both knees 03/15/2012  . Parkinsonism (HCC) 07/13/2010  . Diabetes mellitus type 2, diet-controlled (HCC) 05/22/2009  . ALLERGIC RHINITIS, SEASONAL 02/20/2007  . Hyperlipidemia 09/27/2006  . Essential hypertension 09/27/2006  . GERD 09/27/2006    Nedra Hai PT, DPT [id number]  [facility] [address] [place], [place], [address] Phone: [phone]   Fax:  [phone]  Name: AILEENA IGLESIA MRN: [id number] Date of Birth: 05/25/1941

## 2016-06-14 ENCOUNTER — Ambulatory Visit (HOSPITAL_COMMUNITY): Payer: PPO | Admitting: Physical Therapy

## 2016-06-14 DIAGNOSIS — R2681 Unsteadiness on feet: Secondary | ICD-10-CM | POA: Diagnosis not present

## 2016-06-14 DIAGNOSIS — M25571 Pain in right ankle and joints of right foot: Secondary | ICD-10-CM

## 2016-06-14 DIAGNOSIS — R262 Difficulty in walking, not elsewhere classified: Secondary | ICD-10-CM

## 2016-06-14 DIAGNOSIS — R293 Abnormal posture: Secondary | ICD-10-CM

## 2016-06-14 DIAGNOSIS — M25671 Stiffness of right ankle, not elsewhere classified: Secondary | ICD-10-CM

## 2016-06-14 DIAGNOSIS — Z9181 History of falling: Secondary | ICD-10-CM

## 2016-06-14 DIAGNOSIS — M6281 Muscle weakness (generalized): Secondary | ICD-10-CM

## 2016-06-14 NOTE — Therapy (Signed)
[place] [facility] [address] [place], [place], [address] Phone: [phone]   Fax:  [phone]  Physical Therapy Treatment  Patient Details  Name: Nicole Baxter MRN: [id number] Date of Birth: 1941-08-27 Referring Provider: Kirtland Bouchard Flores/Alex Elson Clan   Encounter Date: 06/14/2016      PT End of Session - 06/14/16 1238    Visit Number 4   Number of Visits 16   Date for PT Re-Evaluation 06/21/16   Authorization Type [employer]    Authorization Time Period 05/24/16 to 07/24/16   Authorization - Visit Number 4   Authorization - Number of Visits 10   PT Start Time 1034   PT Stop Time 1114   PT Time Calculation (min) 40 min   Activity Tolerance Patient tolerated treatment well   Behavior During Therapy [facility] for tasks assessed/performed      Past Medical History  Diagnosis Date  . Obesity   . Hyperglycemia     isolated FBS 122, 95, 104 (Random 138)  . 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 Hyperlipidemia     on pravastatin  . Hypertension     controlled on 2 agents  . Parkinson's disease (tremor, stiffness, slow motion, unstable posture) (HCC)   . Tremor   . GERD (gastroesophageal reflux disease)     pt. states she no longer has GERD (09/11/15)  . Arthritis   . Shortness of breath dyspnea     pt. states that she no longer has SOB )09/11/15    Past Surgical History  Procedure Laterality Date  . Colonoscopy    . Cesarean section    . Total knee arthroplasty Left 09/23/2015    Procedure: LEFT TOTAL KNEE ARTHROPLASTY;  Surgeon: Loreta Ave, MD;  Location: [facility] OR;  Service: Orthopedics;  Laterality: Left;    There were no vitals filed for this visit.      Subjective Assessment - 06/14/16 1040    Subjective patient arrives today stating that her R ankle/foot is bothering her more, she thinks its gout and wants to focus on ankle/foot today instead of PWR    Pertinent History blood thinner (now currently  taking per patient- will check with MD), HTN, GERD, DM, Parkinson, OA, SOB, hx of acute blood clot in January 2017; history of macular degeneration   Patient Stated Goals walking better, better mobility    Currently in Pain? Yes   Pain Score 5    Pain Location Heel   Pain Orientation Right                         [facility] Adult PT Treatment/Exercise - 06/14/16 0001    Ambulation/Gait   Gait Comments cues for step length, heel toe    Manual Therapy   Manual Therapy Soft tissue mobilization   Manual therapy comments separate from all other skilled services   Soft tissue mobilization STM R achillles distal tendon, cross-friction x5 minutes    Ankle Exercises: Stretches   Gastroc Stretch 3 reps;30 seconds   Other Stretch hamstring stretch 3x30 seconds B    Ankle Exercises: Seated   ABC's 1 rep   Ankle Circles/Pumps 10 reps   Other Seated Ankle Exercises ankle pumps 1x15, 1 second holds    Ankle Exercises: Standing   Other Standing Ankle Exercises standing ankle DF and PF 1x10, cues for form  PT Education - 06/14/16 1238    Education provided No   Person(s) Educated Patient   Methods Explanation   Comprehension Verbalized understanding          PT Short Term Goals - 05/24/16 1147    PT SHORT TERM GOAL #1   Title Patient to be able to complete all bed mobility with Min assist in order to demonstrate improved mobility and need for less assistance from family at home    Time 4   Period Weeks   Status New   PT SHORT TERM GOAL #2   Title Patient to be able to complete all stand to sit/sit to stand transfers from various height surfaces with Min assist in order to demonstrate improved mobility and need for less assistance from family at home    Time 4   Period Weeks   Status New   PT SHORT TERM GOAL #3   Title Patient to demonstrate consistent improvements in gait pattern with LRAD, including minimal festination, increased step lengths/stance  times, improved posture, and minimal instabilty in order to improve moblity at home and in community    Time 4   Period Weeks   Status New   PT SHORT TERM GOAL #4   Title Patient to demonstrate improved posture at least 70% of the time  in order to improve balance and overall mechanics of mobility    Time 4   Period Weeks   Status New   PT SHORT TERM GOAL #5   Title Patient to experience R ankle pain no more than 2/10 in order to improve QOL and increase tolerance of weight bearing activities    Time 4   Period Weeks   Status New   Additional Short Term Goals   Additional Short Term Goals Yes   PT SHORT TERM GOAL #6   Title Patient to be independent in correctly and consistently performing appropriate HEP, to be updated PRN    Time 4   Period Weeks   Status New           PT Long Term Goals - 05/24/16 1152    PT LONG TERM GOAL #1   Title Patient to be able to complete all bed mobility and sit to stand/stand to sit transfers with LRAD and Mod(I) in order to demonstrate improved mobility and overall QOL    Time 8   Period Weeks   Status New   PT LONG TERM GOAL #2   Title Patient to be able to ambulate for at least 20 minutes with mechanics WFL, LRAD, pain no more than 1/10 in order to demonstrate improved mobilty and overall QOL    Time 8   Period Weeks   Status New   PT LONG TERM GOAL #3   Title Patient to demonstrate strength 4/5 in all tested muscle groups in order to assist in improving mobiltiy, reducing pain, and improving balance    Time 8   Period Weeks   Status New   PT LONG TERM GOAL #4   Title Patient to demonstrate full R ankle ROM and experience no more than occasional R ankle pain 1/10 in order to improve tolerance to mobility and assist in improving QOL    Time 8   Period Weeks   Status New   PT LONG TERM GOAL #5   Title Patient to be participatory in regular exercise at least 20 minutes in duration, of moderate intensity, at least 4 days per week, in  order to maintain functional gains and assist in managing parkinsonian symptoms    Time 8   Period Weeks   Status New   Additional Long Term Goals   Additional Long Term Goals Yes   PT LONG TERM GOAL #6   Title Patient to report no falls within the past 4 weeks in order to demonstrate improved safety at home and within community    Time 8   Period Weeks   Status New               Plan - 06/14/16 1238    Clinical Impression Statement Patient arrives today stating that she is having quite a bit of pain in her R ankle and wants to focus on this rather than parkinsons today. Worked on functional stretching, ankle mobility exercises, and CKC ankle strengthening today as well. Patient continues to display bradykinesia as well as regular tremors, also demonstrates festinating and retro-pulsion today with occasional Min(A) to correct. Cues for form throughout session today.    Rehab Potential Good   PT Frequency 2x / week   PT Duration 8 weeks   PT Treatment/Interventions ADLs/Self Care Home Management;Biofeedback;Gait training;Stair training;Functional mobility training;Therapeutic activities;Therapeutic exercise;Balance training;Neuromuscular re-education;Patient/family education;Manual techniques;Passive range of motion;Energy conservation;Taping   PT Next Visit Plan  functional stretching and ankle exercise; PWR training, gait and balance training.     Consulted and Agree with Plan of Care Patient;Family member/caregiver   Family Member Consulted spouse       Patient will benefit from skilled therapeutic intervention in order to improve the following deficits and impairments:  Abnormal gait, Decreased endurance, Hypomobility, Impaired tone, Decreased activity tolerance, Decreased strength, Pain, Decreased balance, Decreased mobility, Difficulty walking, Decreased range of motion, Improper body mechanics, Decreased coordination, Decreased safety awareness, Impaired flexibility, Postural  dysfunction, Decreased knowledge of use of DME  Visit Diagnosis: Unsteadiness on feet  Difficulty in walking, not elsewhere classified  History of falling  Muscle weakness (generalized)  Abnormal posture  Pain in right ankle and joints of right foot  Stiffness of right ankle, not elsewhere classified     Problem List Patient Active Problem List   Diagnosis Date Noted  . Achilles tendinitis of right lower extremity 04/05/2016  . Hypertensive retinopathy of both eyes, grade 2 01/15/2016  . Left leg DVT (HCC) 12/18/2015  . 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 Diverticulosis 03/10/2015  . Internal hemorrhoids 03/10/2015  . History of colon polyps 12/25/2014  . Health care maintenance 02/13/2014  . Subclinical hyperthyroidism 12/25/2013  . Kidney mass 06/11/2012  . NSAID-associated gastropathy 06/06/2012  . Osteoarthritis of both knees 03/15/2012  . Parkinsonism (HCC) 07/13/2010  . Diabetes mellitus type 2, diet-controlled (HCC) 05/22/2009  . ALLERGIC RHINITIS, SEASONAL 02/20/2007  . Hyperlipidemia 09/27/2006  . Essential hypertension 09/27/2006  . GERD 09/27/2006    Nedra Hai PT, DPT [phone]  [facility] [address] [place], [place], [address] Phone: [phone]   Fax:  [phone]  Name: JULANN MCGILVRAY MRN: [id number] Date of Birth: 09-26-1941

## 2016-06-17 ENCOUNTER — Ambulatory Visit (INDEPENDENT_AMBULATORY_CARE_PROVIDER_SITE_OTHER): Payer: PPO | Admitting: Internal Medicine

## 2016-06-17 ENCOUNTER — Encounter: Payer: Self-pay | Admitting: Internal Medicine

## 2016-06-17 VITALS — BP 110/64 | HR 68 | Temp 97.7°F | Ht 61.5 in | Wt 164.0 lb

## 2016-06-17 DIAGNOSIS — M7661 Achilles tendinitis, right leg: Secondary | ICD-10-CM

## 2016-06-17 NOTE — Progress Notes (Signed)
Medicine attending: I personally interviewed and briefly examined this patient on the day of the patient visit and reviewed pertinent clinical ,laboratory, and radiographic data  with resident physician Dr. Thomasene LotJames Taylor and we discussed a management plan. She has point tenderness on palpation over insertion of right achilles tendon. She can not take NSAIDS due to previous gastritis. She received no relief from a local steroid injection by a Podiatrist.  She may need to have a ankle immobilizer. Possible MRI. We will refer to Sports Medicine.

## 2016-06-17 NOTE — Assessment & Plan Note (Signed)
Patient continues to have pain associated with her right Achilles tendinitis. She said her pain has been worse over the last 3 weeks. She has had an extensive workup for this in the past ncluding diagnostic imaging and referral to podiatry.Her imaging was not concerning for any fracture or bony abnormality. She was diagnosed with Achilles tendinitis by the podiatrist and was given a steroid injection in May. Today she says this injection did not help her pain and she continues to have pain located over the right Achilles at its insertion. She describes the pain as a pins and needle sensation that is 10/10 when ambulating. She does not have pain at rest. Physical examination revealed pain to palpation along the insertion of the Achilles tendon.Flexion of the right foot was normal however extension was limited. There is no palpable gap of the Achilles tendon to indicate gross rupture. Her current symptoms and physical exam are most consistent with continued Achilles tendinitis of the right lower extremity. We recommend to continue with conservative treatment and symptom management and will make a referral to sports medicine for further evaluation, characterization and treatment. -- Referral to sports medicine -- Continue Tylenol for pain as needed -- Avoid NSAIDs in the setting of NSAID associated gastritis

## 2016-06-17 NOTE — Patient Instructions (Signed)
It was a pleasure seeing you today. [facility]  -- Follow-up with sports medicine

## 2016-06-17 NOTE — Progress Notes (Signed)
CC: Right heel pain HPI: Ms. Randa NgoRuby L Colborn is a 75 y.o. female with a h/o of hypertension, NSAID gastropathy, diabetes, obesity and hyperlipidemia who presents with chronic right heel pain.She has no additional acute complaints or concerns today's visit. She denies headaches, chest pain, shortness of breath, nausea, vomiting or abdominal pain. She also denies fevers, chills, night sweats or weight loss.  Please see problem-based charting for status of medical issues pertinent to this visit.     Past Medical History  Diagnosis Date  . Obesity   . Hyperglycemia     isolated FBS 122, 95, 104 (Random 138)  . 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 Hyperlipidemia     on pravastatin  . Hypertension     controlled on 2 agents  . Parkinson's disease (tremor, stiffness, slow motion, unstable posture) (HCC)   . Tremor   . GERD (gastroesophageal reflux disease)     pt. states she no longer has GERD (09/11/15)  . Arthritis   . Shortness of breath dyspnea     pt. states that she no longer has SOB )09/11/15   Current Outpatient Rx  Name  Route  Sig  Dispense  Refill  . acetaminophen (TYLENOL) 500 MG tablet   Oral   Take 1,000 mg by mouth every 6 (six) hours as needed.         Marland Kitchen. albuterol (PROVENTIL HFA;VENTOLIN HFA) 108 (90 Base) MCG/ACT inhaler   Inhalation   Inhale 2 puffs into the lungs every 6 (six) hours as needed for wheezing or shortness of breath.   1 Inhaler   2   . apixaban (ELIQUIS) 5 MG TABS tablet   Oral   Take 1 tablet (5 mg total) by mouth 2 (two) times daily. To start 03/29/16. BIN [id number] GRP [id number] PCN [id number] ID [id number]   60 tablet   2   . atorvastatin (LIPITOR) 40 MG tablet   Oral   Take 1 tablet (40 mg total) by mouth daily. Patient requested easy open caps for all medication bottles.   30 tablet   3   . bisacodyl (DULCOLAX) 5 MG EC tablet   Oral   Take 1 tablet (5 mg total) by mouth daily as needed for moderate constipation.   30  tablet   0   . Calcium Carbonate-Vitamin D (CALCIUM 600/VITAMIN D) 600-400 MG-UNIT tablet   Oral   Take 2 tablets by mouth daily.   180 tablet   1   . carbidopa-levodopa (SINEMET) 25-100 MG tablet   Oral   Take 1 tablet by mouth 3 (three) times daily. Take 4 hours apart.   270 tablet   4   . hydrochlorothiazide (HYDRODIURIL) 12.5 MG tablet   Oral   Take 1 tablet (12.5 mg total) by mouth daily.   90 tablet   1   . lisinopril (PRINIVIL,ZESTRIL) 20 MG tablet   Oral   Take 1 tablet (20 mg total) by mouth daily.   90 tablet   1   . polyethylene glycol (MIRALAX / GLYCOLAX) packet   Oral   Take 17 g by mouth daily.         . rivastigmine (EXELON) 4.5 MG capsule   Oral   Take 1 capsule (4.5 mg total) by mouth 2 (two) times daily.   60 capsule   11   . rotigotine (NEUPRO) 2 MG/24HR   Transdermal   Place 1 patch onto the skin daily.  30 patch   12   . sennosides-docusate sodium (SENOKOT-S) 8.6-50 MG tablet   Oral   Take 2 tablets by mouth 2 (two) times daily. Reported on 12/18/2015            Review of Systems: A complete ROS was negative except as per HPI.  Physical Exam: There were no vitals filed for this visit. General appearance: alert and cooperative Head: Normocephalic, without obvious abnormality, atraumatic Lungs: clear to auscultation bilaterally Heart: regular rate and rhythm and S1 normal prominent S2, no murmurs rubs or gallops appreciated  Abdomen: soft, non-tender; bowel sounds normal; no masses,  no organomegaly and bowel sounds present, no abdominal bruits auscultated Extremities: extremities normal, atraumatic, no cyanosis or edema and minor swelling over the right medial and lateral malleolus, pain upon palpation of the Achilles tendon at its insertion, good flexion of the right ankle but poor extension noted  Assessment & Plan:  See encounters tab for problem based medical decision making. Patient seen with Dr.  Cyndie Chime  Signed: Thomasene Lot, MD 06/17/2016, 2:02 PM  Pager: [phone]

## 2016-06-21 ENCOUNTER — Encounter (HOSPITAL_COMMUNITY): Payer: Self-pay | Admitting: Physical Therapy

## 2016-06-21 ENCOUNTER — Ambulatory Visit (HOSPITAL_COMMUNITY): Payer: PPO | Admitting: Physical Therapy

## 2016-06-21 DIAGNOSIS — Z9181 History of falling: Secondary | ICD-10-CM

## 2016-06-21 DIAGNOSIS — M6281 Muscle weakness (generalized): Secondary | ICD-10-CM

## 2016-06-21 DIAGNOSIS — R2681 Unsteadiness on feet: Secondary | ICD-10-CM

## 2016-06-21 DIAGNOSIS — R262 Difficulty in walking, not elsewhere classified: Secondary | ICD-10-CM

## 2016-06-21 DIAGNOSIS — R293 Abnormal posture: Secondary | ICD-10-CM

## 2016-06-21 NOTE — Therapy (Signed)
[facility] [address] [place], [place], [address] Phone: [phone]   Fax:  [phone]  Patient Details  Name: Nicole Baxter MRN: [id number] Date of Birth: [birth year] Referring Provider:  No ref. provider found  Encounter Date: 06/21/2016   Patient arrived today with her husband; husband came into clinic and asked to speak to PT, stating that patient is having pain so badly that she is unable to walk, he had to roll her to the car to get here. Went outside and spoke to patient, who stated that her pain is 10/10 and she really does not feel like doing anything with PT; she also stated that she saw a sports medicine MD and she is having surgery on her ankle Friday. Educated patient that we will cancel today's session and that she can call clinic rather than coming to cancel to save them burden of travel if she is not feeling well; next session is Thursday.  Reviewed EPIC notes and did not see mention of surgery from MDs, rather recommendation for continuation of conservative treatment and referral to sports medicine MD.   Will plan to continue with PT POC as scheduled and tolerated at this time unless MD requests termination of skilled PT services. No charge for today's session.    Nedra Hai PT, DPT [phone]  [facility] [address] [place], [place], [address] Phone: [phone]   Fax:  [phone]

## 2016-06-23 ENCOUNTER — Ambulatory Visit (HOSPITAL_COMMUNITY): Payer: PPO | Admitting: Physical Therapy

## 2016-06-23 ENCOUNTER — Telehealth (HOSPITAL_COMMUNITY): Payer: Self-pay

## 2016-06-24 ENCOUNTER — Encounter: Payer: Self-pay | Admitting: Family Medicine

## 2016-06-24 ENCOUNTER — Ambulatory Visit (INDEPENDENT_AMBULATORY_CARE_PROVIDER_SITE_OTHER): Payer: PPO | Admitting: Family Medicine

## 2016-06-24 VITALS — BP 127/81 | Ht 61.5 in | Wt 150.0 lb

## 2016-06-24 DIAGNOSIS — M7661 Achilles tendinitis, right leg: Secondary | ICD-10-CM | POA: Diagnosis not present

## 2016-06-24 MED ORDER — DICLOFENAC SODIUM 1 % TD GEL: TRANSDERMAL | 1 refills | Status: DC

## 2016-06-24 NOTE — Assessment & Plan Note (Signed)
She's currently in physical therapy and has had corticosteroid injection previously. Neither of these have seemed to help much. It's a difficult situation given her other underlying comorbidities including her NSAID-related gastropathy history. I will try her on a 3/16 inch heel left and some topical diclofenac gel area don't think there will be enough systemic absorption for this to cause gastric issues but she is cautioned about that so she develops any stomach upset or black tarry stool she's to stop. I would continue physical therapy. I would recommend ice or heat for additional pain relief. Upper see her back in one month.

## 2016-06-24 NOTE — Patient Instructions (Signed)
Apply a small amount of the topical gel to the RIGHT ACHILLES area three times a day. Wash your hands afterward and do not get in any excess gel your eyes. Wear the heel lift we placed in your shoe most of the time. See me back in about 4 weeks.

## 2016-06-24 NOTE — Progress Notes (Signed)
  Nicole Baxter - 75 y.o. female MRN [id number]  Date of birth: Jul 16, 1941    SUBJECTIVE:     Chief Complaint: Right Achilles pain  HPI: Several months of gradually worsening posterior right heel/Achilles tendon pain. Has been in physical therapy and podiatry without much improvement. She also had an injection  By Podiatry in May. No this has seemed to help much. ROS:     No unusual weight change, fever, sweats, chills.  PERTINENT  PMH / PSH FH / / SH:  Past Medical, Surgical, Social, and Family History Reviewed & Updated in the EMR.  Pertinent findings include:  Parkinson's disease, uses a seated walker.  History of NSAID associated gastropathy Hypertension Diabetes mellitus GERD History of kidney mass Decreased vision   OBJECTIVE: BP 127/81   Ht 5' 1.5" (1.562 m)   Wt 150 lb (68 kg)   BMI 27.88 kg/m   Physical Exam:  Vital signs are reviewed. GEN.: No acute distress ANKLE: Right. Achilles tendon tender to palpation on the distal portion in particularly along the insertion. There is mildly swollen. No defect is noted thinks  SKIN: There is no erythema, no lesions or rash noted at the area of the right Achilles FEET: Bilateral pes planus. Right hallux valgus. VASCULAR: Dorsalis pedis pulses 1-2+ bilaterally are symmetrical. GAIT: walker dependent, shuffling.  ULTRASOUND: Right Achilles tendon is intact with no sign of defect or rupture. The entire tenderness surrounded by fluid within the tendon sheath. There is mild increased Doppler activity at the insertion of the Achilles onto the calcaneus.  ASSESSMENT & PLAN:  See problem based charting & AVS for pt instructions.

## 2016-06-28 ENCOUNTER — Encounter: Payer: Self-pay | Admitting: Sports Medicine

## 2016-06-28 ENCOUNTER — Telehealth (HOSPITAL_COMMUNITY): Payer: Self-pay

## 2016-06-28 ENCOUNTER — Ambulatory Visit (INDEPENDENT_AMBULATORY_CARE_PROVIDER_SITE_OTHER): Payer: PPO | Admitting: Sports Medicine

## 2016-06-28 ENCOUNTER — Ambulatory Visit (HOSPITAL_COMMUNITY): Payer: PPO | Admitting: Physical Therapy

## 2016-06-28 VITALS — BP 136/78

## 2016-06-28 DIAGNOSIS — M7661 Achilles tendinitis, right leg: Secondary | ICD-10-CM | POA: Diagnosis not present

## 2016-06-28 NOTE — Patient Instructions (Signed)
Be sure to continue going to physical therapy. I've written an order for them to try some topical steroids for your heel pain. Be sure to keep your appointment with Dr. Jennette Kettle on August 25th. Continue to ice your heel 3 times a day for 10 minutes each.

## 2016-06-28 NOTE — Progress Notes (Signed)
   Subjective:    Patient ID: Nicole Baxter , female   DOB: 05/14/1941 , 75 y.o..   MRN: [id number]  HPI  Nicole Baxter is here for follow up for right achilles tendinitis. - Patient states her pain has not improved at all since being seen on 7/28.  - Her pain is constant and relieved only with ice. - Pain is worse with ambulation. - Uses a walker at baseline. - The swelling has not improved and it has possibly worsened.  - She has tried Diclofenac gel and shoe inserts but it does not help.  - Continues to attend physical therapy. - It is hard for her to stay off her feet as her husband just had hip surgery and she is taking care of him.  - Denies weakness, numbness, or paresthesias.   Past Medical History: Patient Active Problem List   Diagnosis Date Noted  . Achilles tendinitis of right lower extremity 04/05/2016  . Hypertensive retinopathy of both eyes, grade 2 01/15/2016  . Left leg DVT (HCC) 12/18/2015  . 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 Diverticulosis 03/10/2015  . Internal hemorrhoids 03/10/2015  . History of colon polyps 12/25/2014  . Health care maintenance 02/13/2014  . Subclinical hyperthyroidism 12/25/2013  . Kidney mass 06/11/2012  . NSAID-associated gastropathy 06/06/2012  . Osteoarthritis of both knees 03/15/2012  . Parkinsonism (HCC) 07/13/2010  . Diabetes mellitus type 2, diet-controlled (HCC) 05/22/2009  . ALLERGIC RHINITIS, SEASONAL 02/20/2007  . Hyperlipidemia 09/27/2006  . Essential hypertension 09/27/2006  . GERD 09/27/2006       Objective:   BP 136/78  Physical Exam  Gen: NAD, alert, cooperative with exam, frail appearing Ankles: Moderate swelling over right achilles tendon, no erythema, tender to palpation over right achilles tendon insertion site. Normal range of motion, neurovascularly intact Feet: Onychomycosis of all 10 toenails, bilateral hallux valgus, non tender, normal range of motion, 4/5 strength bilaterally,  neurovascularly intact. Neuro: Shuffling gait, requires walker. Resting tremor of bilateral arms.   Assessment & Plan:  Achilles Tendinitis of right lower extremity: Unchanged since last visit to sports medicine clinic. Standard treatment options are limited due to comorbidities. Would not recommend NSAIDs (hx of NSAID induced gastropathy), would not recommend steroids (hx of gastropathy and diabetes), would not recommend boot as patient has unsteady gait at baseline due to hx of Parkinson's. Diclofenac gel and show inserts have not helped.  - At this time, will continue daily ice therapy since that seems to be one thing that helps the pain -  Continue PT: add iontophoresis - F/U as scheduled at the end of the month with Dr Jennette Kettle  Patient seen and evaluated with the resident. I agree with the above plan of care. Patient has rather significant Achilles tendinopathy. Usual treatment is precluded in this patient with gastropathy and diabetes. Immobilization and a walking boot is not possible due to her shuffling gait from Parkinson's. Unfortunately, treatment options are limited. Ice does seem to be helping. I recommended she continue with this 3 times daily and I also ordered some iontophoresis to be done in physical therapy. She will follow-up with Dr. Jennette Kettle as scheduled at the end of this month.

## 2016-06-29 ENCOUNTER — Ambulatory Visit: Payer: PPO | Admitting: Sports Medicine

## 2016-06-30 ENCOUNTER — Ambulatory Visit (HOSPITAL_COMMUNITY): Payer: PPO | Admitting: Physical Therapy

## 2016-07-01 NOTE — Telephone Encounter (Signed)
cx

## 2016-07-05 ENCOUNTER — Ambulatory Visit (HOSPITAL_COMMUNITY): Payer: PPO | Attending: Internal Medicine | Admitting: Physical Therapy

## 2016-07-05 DIAGNOSIS — M6281 Muscle weakness (generalized): Secondary | ICD-10-CM | POA: Insufficient documentation

## 2016-07-05 DIAGNOSIS — M25571 Pain in right ankle and joints of right foot: Secondary | ICD-10-CM | POA: Diagnosis not present

## 2016-07-05 DIAGNOSIS — R2681 Unsteadiness on feet: Secondary | ICD-10-CM | POA: Diagnosis not present

## 2016-07-05 DIAGNOSIS — R262 Difficulty in walking, not elsewhere classified: Secondary | ICD-10-CM | POA: Diagnosis not present

## 2016-07-05 DIAGNOSIS — M25671 Stiffness of right ankle, not elsewhere classified: Secondary | ICD-10-CM | POA: Diagnosis not present

## 2016-07-05 DIAGNOSIS — Z9181 History of falling: Secondary | ICD-10-CM | POA: Diagnosis not present

## 2016-07-05 DIAGNOSIS — R293 Abnormal posture: Secondary | ICD-10-CM | POA: Diagnosis not present

## 2016-07-05 NOTE — Therapy (Signed)
[place] [facility] [address] , [place], [address] Phone: [phone]   Fax:  [phone]  Physical Therapy Treatment (Re-Assessment)  Patient Details  Name: Nicole Baxter MRN: [id number] Date of Birth: 11/30/1940 Referring Provider: Kirtland BouchardK Flores/Alex Elson ClanM Wilson  Encounter Date: 07/05/2016      PT End of Session - 07/05/16 1234    Visit Number 5   Number of Visits 13   Date for PT Re-Evaluation 08/02/16   Authorization Type [employer] (G-codes done 5th visit)   Authorization Time Period 05/24/16 to 07/24/16; recert done 07/05/16   Authorization - Visit Number 5   Authorization - Number of Visits 15   PT Start Time 1033   PT Stop Time 1119   PT Time Calculation (min) 46 min   Activity Tolerance Patient tolerated treatment well   Behavior During Therapy [facility] for tasks assessed/performed      Past Medical History:  Diagnosis Date  . Arthritis   . GERD (gastroesophageal reflux disease)    pt. states she no longer has GERD (09/11/15)  . Hyperglycemia    isolated FBS 122, 95, 104 (Random 138)  . Hyperlipidemia    on pravastatin  . Hypertension    controlled on 2 agents  . 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 Obesity   . Parkinson's disease (tremor, stiffness, slow motion, unstable posture) (HCC)   . Shortness of breath dyspnea    pt. states that she no longer has SOB )09/11/15  . Tremor     Past Surgical History:  Procedure Laterality Date  . CESAREAN SECTION    . COLONOSCOPY    . TOTAL KNEE ARTHROPLASTY Left 09/23/2015   Procedure: LEFT TOTAL KNEE ARTHROPLASTY;  Surgeon: Loreta Aveaniel F Murphy, MD;  [facility];  Service: Orthopedics;  Laterality: Left;    There were no vitals filed for this visit.      Subjective Assessment - 07/05/16 1042    Subjective Patient arrives today stating that her R ankle/foot is still bothering her. She reports that her pain is getting and staying higher, she has been to a  couple of doctors regarding her ankle, she states they are waiting on them to find the MD who put diabetes on her record as she believes she is not diabetic.    Pertinent History blood thinner (now currently taking per patient- will check with MD), HTN, GERD, DM, Parkinson, OA, SOB, hx of acute blood clot in January 2017; history of macular degeneration   Patient Stated Goals walking better, better mobility    Currently in Pain? Yes   Pain Score 5    Pain Location Ankle   Pain Orientation Right;Posterior;Distal   Pain Descriptors / Indicators Jabbing;Sharp   Pain Type Chronic pain   Pain Radiating Towards none    Pain Onset More than a month ago   Pain Frequency Constant   Aggravating Factors  trying to walk    Pain Relieving Factors not walking on it, not a lot helping    Effect of Pain on Daily Activities painful to walk             [facility] PT Assessment - 07/05/16 0001      Assessment   Medical Diagnosis parkinsons and achilles tendonitis    Referring Provider K Flores/Alex Elson ClanM Wilson   Onset Date/Surgical Date --  chrnoic    Next MD Visit not sure      Balance Screen  Has the patient fallen in the past 6 months Yes   How many times? many, unable to give specific number    Has the patient had a decrease in activity level because of a fear of falling?  Yes   Is the patient reluctant to leave their home because of a fear of falling?  Yes     Prior Function   Level of Independence Needs assistance with transfers;Needs assistance with homemaking;Needs assistance with ADLs;Needs assistance with gait   Vocation Retired     AROM   Right Ankle Dorsiflexion -9   Right Ankle Plantar Flexion --  wfl    Right Ankle Inversion 24   Right Ankle Eversion 14     Strength   Right Hip Flexion 3+/5   Right Hip ABduction 2/5   Left Hip Flexion 3-/5   Left Hip ABduction 2+/5   Right Knee Extension 4/5   Left Knee Extension 4+/5   Right Ankle Dorsiflexion 4/5   Left Ankle Dorsiflexion  4+/5     Bed Mobility   Rolling Right 2: Max assist   Rolling Left 4: Min guard   Supine to Sit 3: Mod assist   Sit to Supine 4: Min guard     Transfers   Sit to Stand 4: Min guard   Stand to Sit 4: Min guard     Ambulation/Gait   Gait Comments noted festinating, shuffling, and occasional freezing episodes during gait especially with transitions such as going through door frames and over different surfaces                      [facility] Adult PT Treatment/Exercise - 07/05/16 0001      Manual Therapy   Manual Therapy Soft tissue mobilization   Manual therapy comments separate from all other skilled services   Soft tissue mobilization STM R achillles distal tendon, cross-friction x5 minutes                 PT Education - 07/05/16 1233    Education provided Yes   Education Details prognosis, POC, significant importance of keeping up with HEP at home as well as compliance to PT schedule, going to focus on PT for achilles now; reasoning behind delaying ionto and trying later if more aggressive PT for achilles does not work    [employer]) Educated Patient   Methods Explanation   Comprehension Verbalized understanding          PT Short Term Goals - 07/05/16 1101      PT SHORT TERM GOAL #1   Title Patient to be able to complete all bed mobility with Min assist in order to demonstrate improved mobility and need for less assistance from family at home    Baseline 8/8- Mod-Max(A) for rolling, Min-Mod(A) for supine<-->sit    Time 4   Period Weeks   Status On-going     PT SHORT TERM GOAL #2   Title Patient to be able to complete all stand to sit/sit to stand transfers from various height surfaces with Min assist in order to demonstrate improved mobility and need for less assistance from family at home    Baseline 8/8- getting better but has not tried low surfaces yet    Time 4   Period Weeks   Status On-going     PT SHORT TERM GOAL #3   Title Patient to  demonstrate consistent improvements in gait pattern with LRAD, including minimal festination, increased step lengths/stance times,  improved posture, and minimal instabilty in order to improve moblity at home and in community    Time 4   Period Weeks   Status On-going     PT SHORT TERM GOAL #4   Title Patient to demonstrate improved posture at least 70% of the time  in order to improve balance and overall mechanics of mobility    Time 4   Period Weeks   Status On-going     PT SHORT TERM GOAL #5   Title Patient to experience R ankle pain no more than 2/10 in order to improve QOL and increase tolerance of weight bearing activities    Baseline 8/8- 5/10 today, 7/10 on average    Time 4   Period Weeks   Status On-going     PT SHORT TERM GOAL #6   Title Patient to be independent in correctly and consistently performing appropriate HEP, to be updated PRN    Baseline 8/8- reports compliance but unable to recite or demonstrate HEP, states she had more from PT when she was only given 2    Period Weeks   Status On-going           PT Long Term Goals - 07/05/16 1104      PT LONG TERM GOAL #1   Title Patient to be able to complete all bed mobility and sit to stand/stand to sit transfers with LRAD and Mod(I) in order to demonstrate improved mobility and overall QOL    Time 8   Period Weeks   Status On-going     PT LONG TERM GOAL #2   Title Patient to be able to ambulate for at least 20 minutes with mechanics WFL, LRAD, pain no more than 1/10 in order to demonstrate improved mobilty and overall QOL    Time 8   Period Weeks   Status On-going     PT LONG TERM GOAL #3   Title Patient to demonstrate strength 4/5 in all tested muscle groups in order to assist in improving mobiltiy, reducing pain, and improving balance    Time 8   Period Weeks   Status On-going     PT LONG TERM GOAL #4   Title Patient to demonstrate full R ankle ROM and experience no more than occasional R ankle pain 1/10  in order to improve tolerance to mobility and assist in improving QOL    Time 8   Period Weeks   Status On-going     PT LONG TERM GOAL #5   Title Patient to be participatory in regular exercise at least 20 minutes in duration, of moderate intensity, at least 4 days per week, in order to maintain functional gains and assist in managing parkinsonian symptoms    Time 8   Period Weeks   Status On-going     PT LONG TERM GOAL #6   Title Patient to report no falls within the past 4 weeks in order to demonstrate improved safety at home and within community    Baseline 8/8- falls reported yesterday for both patient and her husband    Time 8   Period Weeks   Status On-going               Plan - 07/05/16 1235    Clinical Impression Statement Re-assessment performed today. Patient does not appear to be making functional objective changes in any measures, and at this time appears to be experiencing more pain and dysfunction of movement than at her initial evaluation.  It is important to note that patient has not come to PT at the frequency initially prescribed by PT (she has only had 5 visits since her evaluation in late June including today's session), nor does she appear to have been compliant with HEP as she was not able to demonstrate it or recite it today. MD has re-ordered Achilles tendonitis PT with addition of Iontophoresis; however due to treatment sessions thus far being focused on Parkinsons (assessment indicates that tendonitis is related to Parkinsonian gait pattern), and due to concerns over tendon impairment from ionto secondary to chronic inactivity/disuse of patient/effects of dexamethasone on soft tissue, plan to trial more aggressive focus on PT for Achilles tendonitis and will transition to iontophoresis at later date if pain does not reduce. Will continue PT POC with focus on Achilles tendonitis at this time.    Rehab Potential Good   PT Frequency 2x / week   PT Duration 4 weeks    PT Treatment/Interventions ADLs/Self Care Home Management;Biofeedback;Gait training;Stair training;Functional mobility training;Therapeutic activities;Therapeutic exercise;Balance training;Neuromuscular re-education;Patient/family education;Manual techniques;Passive range of motion;Energy conservation;Taping   PT Next Visit Plan focus on achillles tendonitis, parkinsons on hold for now. Focus on stretching, manual, exercise, only go to ionto if pain is not reducing in 5-6 sessions.    Consulted and Agree with Plan of Care Patient      Patient will benefit from skilled therapeutic intervention in order to improve the following deficits and impairments:  Abnormal gait, Decreased endurance, Hypomobility, Impaired tone, Decreased activity tolerance, Decreased strength, Pain, Decreased balance, Decreased mobility, Difficulty walking, Decreased range of motion, Improper body mechanics, Decreased coordination, Decreased safety awareness, Impaired flexibility, Postural dysfunction, Decreased knowledge of use of DME  Visit Diagnosis: Unsteadiness on feet - Plan: PT plan of care cert/re-cert  Difficulty in walking, not elsewhere classified - Plan: PT plan of care cert/re-cert  History of falling - Plan: PT plan of care cert/re-cert  Muscle weakness (generalized) - Plan: PT plan of care cert/re-cert  Abnormal posture - Plan: PT plan of care cert/re-cert  Pain in right ankle and joints of right foot - Plan: PT plan of care cert/re-cert  Stiffness of right ankle, not elsewhere classified - Plan: PT plan of care cert/re-cert       G-Codes - 15-Jul-2016 1246    Functional Assessment Tool Used Based on skilled clinical assessment of gait, functional mobility, functional balance,strength, safety awareness, ROM    Functional Limitation Mobility: Walking and moving around   Mobility: Walking and Moving Around Current Status (W0981) At least 80 percent but less than 100 percent impaired, limited or  restricted   Mobility: Walking and Moving Around Goal Status (X9147) At least 60 percent but less than 80 percent impaired, limited or restricted      Problem List Patient Active Problem List   Diagnosis Date Noted  . Achilles tendinitis of right lower extremity 04/05/2016  . Hypertensive retinopathy of both eyes, grade 2 01/15/2016  . Left leg DVT (HCC) 12/18/2015  . 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 Diverticulosis 03/10/2015  . Internal hemorrhoids 03/10/2015  . History of colon polyps 12/25/2014  . Health care maintenance 02/13/2014  . Subclinical hyperthyroidism 12/25/2013  . Kidney mass 06/11/2012  . NSAID-associated gastropathy 06/06/2012  . Osteoarthritis of both knees 03/15/2012  . Parkinsonism (HCC) 07/13/2010  . Diabetes mellitus type 2, diet-controlled (HCC) 05/22/2009  . ALLERGIC RHINITIS, SEASONAL 02/20/2007  . Hyperlipidemia 09/27/2006  . Essential hypertension 09/27/2006  . GERD 09/27/2006  Deniece Ree PT, DPT [phone]  [place] [address] [place], [place], [address] Phone: [phone]   Fax:  [phone]  Name: Nicole Baxter MRN: [id number] Date of Birth: [birth year]

## 2016-07-07 ENCOUNTER — Ambulatory Visit (HOSPITAL_COMMUNITY): Payer: PPO | Admitting: Physical Therapy

## 2016-07-07 DIAGNOSIS — R2681 Unsteadiness on feet: Secondary | ICD-10-CM

## 2016-07-07 DIAGNOSIS — M25571 Pain in right ankle and joints of right foot: Secondary | ICD-10-CM

## 2016-07-07 DIAGNOSIS — R262 Difficulty in walking, not elsewhere classified: Secondary | ICD-10-CM

## 2016-07-07 DIAGNOSIS — R293 Abnormal posture: Secondary | ICD-10-CM

## 2016-07-07 DIAGNOSIS — Z9181 History of falling: Secondary | ICD-10-CM

## 2016-07-07 DIAGNOSIS — M25671 Stiffness of right ankle, not elsewhere classified: Secondary | ICD-10-CM

## 2016-07-07 DIAGNOSIS — M6281 Muscle weakness (generalized): Secondary | ICD-10-CM

## 2016-07-07 NOTE — Therapy (Signed)
[place] [facility] [address] [place], [place], [address] Phone: [phone]   Fax:  [phone]  Physical Therapy Treatment  Patient Details  Name: Nicole NgoRuby L Baxter MRN: [id number] Date of Birth: 02/24/1941 Referring Provider: Kirtland BouchardK Flores/Alex Elson ClanM Wilson  Encounter Date: 07/07/2016      PT End of Session - 07/07/16 1328    Visit Number 6   Number of Visits 13   Date for PT Re-Evaluation 08/02/16   Authorization Type [employer] (G-codes done 5th visit)   Authorization Time Period 05/24/16 to 07/24/16; recert done 07/05/16   Authorization - Visit Number 6   Authorization - Number of Visits 15   PT Start Time 1032   PT Stop Time 1113   PT Time Calculation (min) 41 min   Activity Tolerance Patient tolerated treatment well   Behavior During Therapy [facility] for tasks assessed/performed      Past Medical History:  Diagnosis Date  . Arthritis   . GERD (gastroesophageal reflux disease)    pt. states she no longer has GERD (09/11/15)  . Hyperglycemia    isolated FBS 122, 95, 104 (Random 138)  . Hyperlipidemia    on pravastatin  . Hypertension    controlled on 2 agents  . 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 Obesity   . Parkinson's disease (tremor, stiffness, slow motion, unstable posture) (HCC)   . Shortness of breath dyspnea    pt. states that she no longer has SOB )09/11/15  . Tremor     Past Surgical History:  Procedure Laterality Date  . CESAREAN SECTION    . COLONOSCOPY    . TOTAL KNEE ARTHROPLASTY Left 09/23/2015   Procedure: LEFT TOTAL KNEE ARTHROPLASTY;  Surgeon: Loreta Aveaniel F Murphy, MD;  [facility];  Service: Orthopedics;  Laterality: Left;    There were no vitals filed for this visit.      Subjective Assessment - 07/07/16 1039    Subjective [place] [facility]. She felt a little bit better after the massage that was done to her  ankle last time. Nothing else major going on right now.    Patient is accompained by: Family member   Pertinent History blood thinner (now currently taking per patient- will check with MD), HTN, GERD, DM, Parkinson, OA, SOB, hx of acute blood clot in January 2017; history of macular degeneration   Patient Stated Goals walking better, better mobility    Currently in Pain? Yes   Pain Score 8    Pain Location Ankle   Pain Orientation Right;Posterior;Distal                         [facility] Adult PT Treatment/Exercise - 07/07/16 0001      Ambulation/Gait   Ambulation/Gait Yes   Ambulation/Gait Assistance 4: Min guard   Ambulation Distance (Feet) 75 Feet  x2   Assistive device Rollator   Gait Comments cues for gait rhythm and step length, reciprocal pattern      Manual Therapy   Manual Therapy Joint mobilization;Soft tissue mobilization   Manual therapy comments separate from all other skilled services   Joint Mobilization grade II-III mobilization of R foot supination/pronation, talar DF, calcaneal inversion/eversion   Soft tissue mobilization STM R achillles distal tendon, cross-friction x5 minutes      Ankle Exercises: Stretches   Gastroc Stretch 4 reps;30 seconds  Other Stretch seated by PT      Ankle Exercises: Supine   Other Supine Ankle Exercises ankle DF with yellow band 1x10   Other Supine Ankle Exercises inversion/eversion 1x10 with tactile cues/guiding                 PT Education - 07/07/16 1327    Education provided No          PT Short Term Goals - 07/05/16 1101      PT SHORT TERM GOAL #1   Title Patient to be able to complete all bed mobility with Min assist in order to demonstrate improved mobility and need for less assistance from family at home    Baseline 8/8- Mod-Max(A) for rolling, Min-Mod(A) for supine<-->sit    Time 4   Period Weeks   Status On-going     PT SHORT TERM GOAL #2   Title Patient to be able to complete all stand  to sit/sit to stand transfers from various height surfaces with Min assist in order to demonstrate improved mobility and need for less assistance from family at home    Baseline 8/8- getting better but has not tried low surfaces yet    Time 4   Period Weeks   Status On-going     PT SHORT TERM GOAL #3   Title Patient to demonstrate consistent improvements in gait pattern with LRAD, including minimal festination, increased step lengths/stance times, improved posture, and minimal instabilty in order to improve moblity at home and in community    Time 4   Period Weeks   Status On-going     PT SHORT TERM GOAL #4   Title Patient to demonstrate improved posture at least 70% of the time  in order to improve balance and overall mechanics of mobility    Time 4   Period Weeks   Status On-going     PT SHORT TERM GOAL #5   Title Patient to experience R ankle pain no more than 2/10 in order to improve QOL and increase tolerance of weight bearing activities    Baseline 8/8- 5/10 today, 7/10 on average    Time 4   Period Weeks   Status On-going     PT SHORT TERM GOAL #6   Title Patient to be independent in correctly and consistently performing appropriate HEP, to be updated PRN    Baseline 8/8- reports compliance but unable to recite or demonstrate HEP, states she had more from PT when she was only given 2    Period Weeks   Status On-going           PT Long Term Goals - 07/05/16 1104      PT LONG TERM GOAL #1   Title Patient to be able to complete all bed mobility and sit to stand/stand to sit transfers with LRAD and Mod(I) in order to demonstrate improved mobility and overall QOL    Time 8   Period Weeks   Status On-going     PT LONG TERM GOAL #2   Title Patient to be able to ambulate for at least 20 minutes with mechanics WFL, LRAD, pain no more than 1/10 in order to demonstrate improved mobilty and overall QOL    Time 8   Period Weeks   Status On-going     PT LONG TERM GOAL #3    Title Patient to demonstrate strength 4/5 in all tested muscle groups in order to assist in improving mobiltiy, reducing pain, and  improving balance    Time 8   Period Weeks   Status On-going     PT LONG TERM GOAL #4   Title Patient to demonstrate full R ankle ROM and experience no more than occasional R ankle pain 1/10 in order to improve tolerance to mobility and assist in improving QOL    Time 8   Period Weeks   Status On-going     PT LONG TERM GOAL #5   Title Patient to be participatory in regular exercise at least 20 minutes in duration, of moderate intensity, at least 4 days per week, in order to maintain functional gains and assist in managing parkinsonian symptoms    Time 8   Period Weeks   Status On-going     PT LONG TERM GOAL #6   Title Patient to report no falls within the past 4 weeks in order to demonstrate improved safety at home and within community    Baseline 8/8- falls reported yesterday for both patient and her husband    Time 8   Period Weeks   Status On-going               Plan - 07/07/16 1328    Clinical Impression Statement Focused on painful R Achilles tendonitis today, with quite a bit of time being spent on manual techniques to address joint and soft tissue limitations in supine today; also introduced supine ankle exercises with tactile guiding for correct performance of motion. Performed gait training into and back out of clinic with focus on step lengths and reciprocal gait pattern with quite a bit of freezing noted during gait today, able to break freezing episodes with verbal cuing. Patient's pain reduced to 3/10 at end of session; encouraged them to use ice on painful area at home rather than heat.    Rehab Potential Good   PT Frequency 2x / week   PT Duration 4 weeks   PT Treatment/Interventions ADLs/Self Care Home Management;Biofeedback;Gait training;Stair training;Functional mobility training;Therapeutic activities;Therapeutic exercise;Balance  training;Neuromuscular re-education;Patient/family education;Manual techniques;Passive range of motion;Energy conservation;Taping   PT Next Visit Plan focus on achillles tendonitis, parkinsons on hold for now. Focus on stretching, manual, exercise, only go to ionto if pain is not reducing in 5-6 sessions.    Consulted and Agree with Plan of Care Patient      Patient will benefit from skilled therapeutic intervention in order to improve the following deficits and impairments:  Abnormal gait, Decreased endurance, Hypomobility, Impaired tone, Decreased activity tolerance, Decreased strength, Pain, Decreased balance, Decreased mobility, Difficulty walking, Decreased range of motion, Improper body mechanics, Decreased coordination, Decreased safety awareness, Impaired flexibility, Postural dysfunction, Decreased knowledge of use of DME  Visit Diagnosis: Unsteadiness on feet  Difficulty in walking, not elsewhere classified  History of falling  Muscle weakness (generalized)  Abnormal posture  Pain in right ankle and joints of right foot  Stiffness of right ankle, not elsewhere classified     Problem List Patient Active Problem List   Diagnosis Date Noted  . Achilles tendinitis of right lower extremity 04/05/2016  . Hypertensive retinopathy of both eyes, grade 2 01/15/2016  . Left leg DVT (HCC) 12/18/2015  . 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 Diverticulosis 03/10/2015  . Internal hemorrhoids 03/10/2015  . History of colon polyps 12/25/2014  . Health care maintenance 02/13/2014  . Subclinical hyperthyroidism 12/25/2013  . Kidney mass 06/11/2012  . NSAID-associated gastropathy 06/06/2012  . Osteoarthritis of both knees 03/15/2012  . Parkinsonism (HCC) 07/13/2010  .  Diabetes mellitus type 2, diet-controlled (HCC) 05/22/2009  . ALLERGIC RHINITIS, SEASONAL 02/20/2007  . Hyperlipidemia 09/27/2006  . Essential hypertension 09/27/2006  . GERD 09/27/2006     Nedra Hai PT, DPT [phone]  [facility] [address] [place], [place], [address] Phone: [phone]   Fax:  [phone]  Name: Nicole Baxter MRN: [id number] Date of Birth: 19-May-1941

## 2016-07-08 ENCOUNTER — Telehealth: Payer: Self-pay | Admitting: *Deleted

## 2016-07-08 NOTE — Telephone Encounter (Signed)
Call to patient to discuss visit with Sports Medicine.  Patient states that she is not a Diabetic and has no issues with her stomach.  Is still having a lot of pain in her foot.  Patient was informed that I had spoken to Dr. Heide SparkNarendra her PCP and that he has suggested that she come in for an appointment so that further pain options can be discussed and looked at.  Patient  agreed and has an appointment to come to the Clinics next week.  Angelina OkGladys Herbin, RN 07/08/2016 11:045 AM

## 2016-07-11 ENCOUNTER — Ambulatory Visit (HOSPITAL_COMMUNITY): Payer: PPO | Admitting: Physical Therapy

## 2016-07-11 DIAGNOSIS — M25571 Pain in right ankle and joints of right foot: Secondary | ICD-10-CM

## 2016-07-11 DIAGNOSIS — Z9181 History of falling: Secondary | ICD-10-CM

## 2016-07-11 DIAGNOSIS — M6281 Muscle weakness (generalized): Secondary | ICD-10-CM

## 2016-07-11 DIAGNOSIS — R262 Difficulty in walking, not elsewhere classified: Secondary | ICD-10-CM

## 2016-07-11 DIAGNOSIS — R293 Abnormal posture: Secondary | ICD-10-CM

## 2016-07-11 DIAGNOSIS — M25671 Stiffness of right ankle, not elsewhere classified: Secondary | ICD-10-CM

## 2016-07-11 DIAGNOSIS — R2681 Unsteadiness on feet: Secondary | ICD-10-CM | POA: Diagnosis not present

## 2016-07-11 NOTE — Therapy (Signed)
[facility] [address] [place], [place], [address] Phone: [phone]   Fax:  [phone]  Physical Therapy Treatment  Patient Details  Name: Nicole Baxter MRN: [id number] Date of Birth: 12/19/1940 Referring Provider: Kirtland BouchardK Flores/Alex Elson ClanM Wilson  Encounter Date: 07/11/2016      PT End of Session - 07/11/16 1144    Visit Number 7   Number of Visits 13   Date for PT Re-Evaluation 08/02/16   Authorization Type [employer] (G-codes done 5th visit)   Authorization Time Period 05/24/16 to 07/24/16; recert done 07/05/16   Authorization - Visit Number 7   Authorization - Number of Visits 15   PT Start Time 1034   PT Stop Time 1130   PT Time Calculation (min) 56 min   Activity Tolerance Patient tolerated treatment well   Behavior [facility] tasks assessed/performed      Past Medical History:  Diagnosis Date  . Arthritis   . GERD (gastroesophageal reflux disease)    pt. states she no longer has GERD (09/11/15)  . Hyperglycemia    isolated FBS 122, 95, 104 (Random 138)  . Hyperlipidemia    on pravastatin  . Hypertension    controlled on 2 agents  . 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 Obesity   . Parkinson's disease (tremor, stiffness, slow motion, unstable posture) (HCC)   . Shortness of breath dyspnea    pt. states that she no longer has SOB )09/11/15  . Tremor     Past Surgical History:  Procedure Laterality Date  . CESAREAN SECTION    . COLONOSCOPY    . TOTAL KNEE ARTHROPLASTY Left 09/23/2015   Procedure: LEFT TOTAL KNEE ARTHROPLASTY;  Surgeon: Loreta Aveaniel F Murphy, MD;  [facility];  Service: Orthopedics;  Laterality: Left;    There were no vitals filed for this visit.      Subjective Assessment - 07/11/16 1038    Subjective Patient arrives stating she is feeling good, no changes in her medicines and her pain stayed down after last session. Her husband has fallen twice, she did not but  almost did.    Currently in Pain? Yes   Pain Score 6    Pain Location Ankle   Pain Orientation Right;Proximal;Distal                         [facility] Adult PT Treatment/Exercise - 07/11/16 0001      Ambulation/Gait   Ambulation/Gait Yes   Ambulation/Gait Assistance 4: Min guard   Ambulation Distance (Feet) --  141, 3173ft    Assistive device Rollator   Gait Comments cues for heel-toe, step lengths, posture      Manual Therapy   Manual Therapy Joint mobilization;Soft tissue mobilization;Edema management   Manual therapy comments separate from all other skilled services   Edema Management retrograde massage R foot/ankle    Joint Mobilization grade III mobilization of R foot supination/pronation, talar DF, calcaneal inversion/eversion   Soft tissue mobilization STM R achillles distal tendon, cross-friction x5 minutes      Ankle Exercises: Supine   Other Supine Ankle Exercises ankle DF with yellow band 1x10   Other Supine Ankle Exercises inversion/eversion 1x10 with tactile cues/guiding      Ankle Exercises: Seated   Ankle Circles/Pumps 10 reps;Other (comment)  tactile cues    Other Seated Ankle Exercises ankle circles CW and CCW x10 each, tactile cues; AP on  rocker board 1 min, 15 seconds, tactile cues                 PT Education - 07/11/16 1144    Education provided Yes   Education Details patient already seems to be responding well to aggressive PT for achillles tendonitis without ionto at this point   Person(s) Educated Patient   Methods Explanation   Comprehension Verbalized understanding          PT Short Term Goals - 07/05/16 1101      PT SHORT TERM GOAL #1   Title Patient to be able to complete all bed mobility with Min assist in order to demonstrate improved mobility and need for less assistance from family at home    Baseline 8/8- Mod-Max(A) for rolling, Min-Mod(A) for supine<-->sit    Time 4   Period Weeks   Status On-going     PT  SHORT TERM GOAL #2   Title Patient to be able to complete all stand to sit/sit to stand transfers from various height surfaces with Min assist in order to demonstrate improved mobility and need for less assistance from family at home    Baseline 8/8- getting better but has not tried low surfaces yet    Time 4   Period Weeks   Status On-going     PT SHORT TERM GOAL #3   Title Patient to demonstrate consistent improvements in gait pattern with LRAD, including minimal festination, increased step lengths/stance times, improved posture, and minimal instabilty in order to improve moblity at home and in community    Time 4   Period Weeks   Status On-going     PT SHORT TERM GOAL #4   Title Patient to demonstrate improved posture at least 70% of the time  in order to improve balance and overall mechanics of mobility    Time 4   Period Weeks   Status On-going     PT SHORT TERM GOAL #5   Title Patient to experience R ankle pain no more than 2/10 in order to improve QOL and increase tolerance of weight bearing activities    Baseline 8/8- 5/10 today, 7/10 on average    Time 4   Period Weeks   Status On-going     PT SHORT TERM GOAL #6   Title Patient to be independent in correctly and consistently performing appropriate HEP, to be updated PRN    Baseline 8/8- reports compliance but unable to recite or demonstrate HEP, states she had more from PT when she was only given 2    Period Weeks   Status On-going           PT Long Term Goals - 07/05/16 1104      PT LONG TERM GOAL #1   Title Patient to be able to complete all bed mobility and sit to stand/stand to sit transfers with LRAD and Mod(I) in order to demonstrate improved mobility and overall QOL    Time 8   Period Weeks   Status On-going     PT LONG TERM GOAL #2   Title Patient to be able to ambulate for at least 20 minutes with mechanics WFL, LRAD, pain no more than 1/10 in order to demonstrate improved mobilty and overall QOL     Time 8   Period Weeks   Status On-going     PT LONG TERM GOAL #3   Title Patient to demonstrate strength 4/5 in all tested muscle groups in order  to assist in improving mobiltiy, reducing pain, and improving balance    Time 8   Period Weeks   Status On-going     PT LONG TERM GOAL #4   Title Patient to demonstrate full R ankle ROM and experience no more than occasional R ankle pain 1/10 in order to improve tolerance to mobility and assist in improving QOL    Time 8   Period Weeks   Status On-going     PT LONG TERM GOAL #5   Title Patient to be participatory in regular exercise at least 20 minutes in duration, of moderate intensity, at least 4 days per week, in order to maintain functional gains and assist in managing parkinsonian symptoms    Time 8   Period Weeks   Status On-going     PT LONG TERM GOAL #6   Title Patient to report no falls within the past 4 weeks in order to demonstrate improved safety at home and within community    Baseline 8/8- falls reported yesterday for both patient and her husband    Time 8   Period Weeks   Status On-going               Plan - 07/11/16 1145    Clinical Impression Statement Continued focus on painful R Achilles today/continuing to hold Parkinson's PT per patient request for pain relief. Continued with extensive manual to R foot/ankle today, noting less sensitivity of patient to mobilizations and soft tissue work, as well as improved joint play throughout R foot/ankle complex; able to apply deeper pressure to distal Achilles attachment site, also indicating general improvement of condition. Expanded focus on functional exercises as well today, introducing seated exercises and rocker board as well; patient did require tactile cues for exercises today but was able to perform well with cues. Finished session with gait training with rollator, with strong focus placed on step length, heel-toe mechanics, and posture; patient limited by fatigue  during gait and did require Mod verbal cues to maintain gait mechanics. Escorted patient outside to care and ensured safe transfer into vehicle due to rainy weather today. Patient appears to be responding well to aggressive PT focused on Achilles; will plan to continue this POC and hold ionto unless pain relief is not achieved via current treatments. Due to reported frequent falling of husband, asked if he would be interested in skilled PT services, however he refuses care/services at this time.    Rehab Potential Good   PT Frequency 2x / week   PT Duration 4 weeks   PT Treatment/Interventions ADLs/Self Care Home Management;Biofeedback;Gait training;Stair training;Functional mobility training;Therapeutic activities;Therapeutic exercise;Balance training;Neuromuscular re-education;Patient/family education;Manual techniques;Passive range of motion;Energy conservation;Taping   PT Next Visit Plan focus on achillles tendonitis, parkinsons on hold for now. Focus on stretching, manual, exercise, only go to ionto if pain is not reducing in 5-6 sessions.    Consulted and Agree with Plan of Care Patient   Family Member Consulted spouse       Patient will benefit from skilled therapeutic intervention in order to improve the following deficits and impairments:  Abnormal gait, Decreased endurance, Hypomobility, Impaired tone, Decreased activity tolerance, Decreased strength, Pain, Decreased balance, Decreased mobility, Difficulty walking, Decreased range of motion, Improper body mechanics, Decreased coordination, Decreased safety awareness, Impaired flexibility, Postural dysfunction, Decreased knowledge of use of DME  Visit Diagnosis: Unsteadiness on feet  Difficulty in walking, not elsewhere classified  History of falling  Muscle weakness (generalized)  Abnormal posture  Pain in  right ankle and joints of right foot  Stiffness of right ankle, not elsewhere classified     Problem List Patient Active  Problem List   Diagnosis Date Noted  . Achilles tendinitis of right lower extremity 04/05/2016  . Hypertensive retinopathy of both eyes, grade 2 01/15/2016  . Left leg DVT (HCC) 12/18/2015  . 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 Diverticulosis 03/10/2015  . Internal hemorrhoids 03/10/2015  . History of colon polyps 12/25/2014  . Health care maintenance 02/13/2014  . Subclinical hyperthyroidism 12/25/2013  . Kidney mass 06/11/2012  . NSAID-associated gastropathy 06/06/2012  . Osteoarthritis of both knees 03/15/2012  . Parkinsonism (HCC) 07/13/2010  . Diabetes mellitus type 2, diet-controlled (HCC) 05/22/2009  . ALLERGIC RHINITIS, SEASONAL 02/20/2007  . Hyperlipidemia 09/27/2006  . Essential hypertension 09/27/2006  . GERD 09/27/2006    Nedra HaiKristen Unger PT, DPT [phone]  [facility] [address] [place], [place], [address] Phone: [phone]   Fax:  [phone]  Name: Nicole NgoRuby L Baxter MRN: [id number] Date of Birth: 02/22/1941

## 2016-07-12 ENCOUNTER — Ambulatory Visit (HOSPITAL_COMMUNITY): Payer: PPO | Admitting: Physical Therapy

## 2016-07-12 ENCOUNTER — Ambulatory Visit (INDEPENDENT_AMBULATORY_CARE_PROVIDER_SITE_OTHER): Payer: PPO | Admitting: Internal Medicine

## 2016-07-12 ENCOUNTER — Encounter: Payer: Self-pay | Admitting: Internal Medicine

## 2016-07-12 ENCOUNTER — Ambulatory Visit (HOSPITAL_COMMUNITY): Payer: PPO

## 2016-07-12 VITALS — BP 145/80 | HR 73 | Temp 98.2°F | Ht 61.5 in | Wt 138.2 lb

## 2016-07-12 DIAGNOSIS — N2889 Other specified disorders of kidney and ureter: Secondary | ICD-10-CM | POA: Diagnosis not present

## 2016-07-12 DIAGNOSIS — E119 Type 2 diabetes mellitus without complications: Secondary | ICD-10-CM

## 2016-07-12 DIAGNOSIS — I82402 Acute embolism and thrombosis of unspecified deep veins of left lower extremity: Secondary | ICD-10-CM

## 2016-07-12 DIAGNOSIS — I10 Essential (primary) hypertension: Secondary | ICD-10-CM | POA: Diagnosis not present

## 2016-07-12 DIAGNOSIS — M7661 Achilles tendinitis, right leg: Secondary | ICD-10-CM

## 2016-07-12 DIAGNOSIS — Z1239 Encounter for other screening for malignant neoplasm of breast: Secondary | ICD-10-CM

## 2016-07-12 DIAGNOSIS — Z7901 Long term (current) use of anticoagulants: Secondary | ICD-10-CM

## 2016-07-12 LAB — GLUCOSE, CAPILLARY: GLUCOSE-CAPILLARY: 70 mg/dL (ref 65–99)

## 2016-07-12 LAB — POCT GLYCOSYLATED HEMOGLOBIN (HGB A1C): Hemoglobin A1C: 5.2

## 2016-07-12 MED ORDER — APIXABAN 5 MG PO TABS: 5.0000 mg | ORAL_TABLET | Freq: Two times a day (BID) | ORAL | 2 refills | Status: DC

## 2016-07-12 MED ORDER — DICLOFENAC SODIUM 1 % TD GEL: TRANSDERMAL | 1 refills | Status: DC

## 2016-07-12 NOTE — Assessment & Plan Note (Addendum)
-   BP mildly elevated today - She is compliant with meds - Will continue with lisinopril and HCTZ - Will consider prescribing a combo pill on next visit

## 2016-07-12 NOTE — Assessment & Plan Note (Signed)
Lab Results  Component Value Date   HGBA1C 5.2 07/12/2016   HGBA1C 5.6 10/14/2015   HGBA1C 5.3 06/03/2015     Assessment: Diabetes control:  well controlled Progress toward A1C goal:   at goal Comments: diet controlled  Plan: Medications:  diet controlled Home glucose monitoring: Frequency:   Timing:   Instruction/counseling given: no instruction/counseling  Educational resources provided: brochure (denies) Self management tools provided:   Other plans:

## 2016-07-12 NOTE — Assessment & Plan Note (Signed)
-   I do not see a follow up for her right renal mass - Patient denies hematuria. No abd pain, no weight loss - Will check renal sono to reevaluate this mass

## 2016-07-12 NOTE — Assessment & Plan Note (Signed)
-   Will place order for screening mammogram - Patient is in agreement

## 2016-07-12 NOTE — Patient Instructions (Addendum)
-   It was a pleasure meeting you - The ankle is improving. Continue with your physical therapy and medications - Please follow up in 1 month - We have ordered a mammogram and an ultrasound of your kidneys - I will call with the results

## 2016-07-12 NOTE — Progress Notes (Signed)
   Subjective:    Patient ID: Nicole Baxter, female    DOB: 10/27/1941, 75 y.o.   MRN: [id number]  HPI  Patient is here for follow up of her right achilles tendinitis and her HTN. She states that she is following up with PT for her tendinitis and also followed up with sports medicine. She was at PT yesterday. She states her pain has improved and her right ankle swelling is decreasing. She was curious as to whether she could get a steroid injection to help with the tendinitis. She denies any other complaints at this time   Review of Systems  Constitutional: Negative.   HENT: Negative.   Respiratory: Negative.   Cardiovascular: Negative.   Gastrointestinal: Negative.   Musculoskeletal:       - complains of pain and swelling in her right ankle but it has significantly improved per patient  Skin: Negative.   Neurological: Negative.   Psychiatric/Behavioral: Negative.        Objective:   Physical Exam  Constitutional: She appears well-developed and well-nourished.  HENT:  Head: Normocephalic and atraumatic.  Neck: Normal range of motion. Neck supple.  Cardiovascular: Normal rate, regular rhythm and normal heart sounds.   No murmur heard. Pulmonary/Chest: Effort normal and breath sounds normal. No respiratory distress. She has no wheezes.  Abdominal: Soft. Bowel sounds are normal. She exhibits no distension. There is no tenderness.  Musculoskeletal: Normal range of motion.  Mild right ankle swelling with mild tenderness to palpation over achilles tendon  Lymphadenopathy:    She has no cervical adenopathy.          Assessment & Plan:  Please see problem based charting for assessment and plan:

## 2016-07-12 NOTE — Assessment & Plan Note (Signed)
-   Right ankle pain and swelling seems to have improved substantially - Would c/w PT - Outpatient f/u with sports medicine - I believe she is improving with conservative treatment alone and does not require further intervention for now - Will refill voltaren gel

## 2016-07-12 NOTE — Assessment & Plan Note (Signed)
-   Patient is compliant with eliquis and is taking her medication appropriately - Will continue with eliquis for now - Will need atleast 6 months of anticoagulation (Started in May 2017). Should finish in November

## 2016-07-13 ENCOUNTER — Other Ambulatory Visit: Payer: Self-pay | Admitting: Internal Medicine

## 2016-07-13 DIAGNOSIS — I10 Essential (primary) hypertension: Secondary | ICD-10-CM

## 2016-07-14 ENCOUNTER — Ambulatory Visit (HOSPITAL_COMMUNITY): Payer: PPO | Admitting: Physical Therapy

## 2016-07-14 ENCOUNTER — Encounter (HOSPITAL_COMMUNITY): Payer: PPO | Admitting: Physical Therapy

## 2016-07-14 ENCOUNTER — Telehealth (HOSPITAL_COMMUNITY): Payer: Self-pay | Admitting: Physical Therapy

## 2016-07-14 DIAGNOSIS — M25571 Pain in right ankle and joints of right foot: Secondary | ICD-10-CM

## 2016-07-14 DIAGNOSIS — R2681 Unsteadiness on feet: Secondary | ICD-10-CM

## 2016-07-14 DIAGNOSIS — Z9181 History of falling: Secondary | ICD-10-CM

## 2016-07-14 DIAGNOSIS — R293 Abnormal posture: Secondary | ICD-10-CM

## 2016-07-14 DIAGNOSIS — M25671 Stiffness of right ankle, not elsewhere classified: Secondary | ICD-10-CM

## 2016-07-14 DIAGNOSIS — M6281 Muscle weakness (generalized): Secondary | ICD-10-CM

## 2016-07-14 DIAGNOSIS — R262 Difficulty in walking, not elsewhere classified: Secondary | ICD-10-CM

## 2016-07-14 NOTE — Therapy (Signed)
[facility] [facility] [address] [place], [place], [address] Phone: [phone]   Fax:  [phone]  Physical Therapy Treatment  Patient Details  Name: Nicole Baxter MRN: [id number] Date of Birth: 05/13/1941 Referring Provider: Kirtland BouchardK Flores/Alex Elson ClanM Wilson  Encounter Date: 07/14/2016      PT End of Session - 07/14/16 1205    Visit Number 8   Number of Visits 13   Date for PT Re-Evaluation 08/02/16   Authorization Type [employer] (G-codes done 5th visit)   Authorization Time Period 05/24/16 to 07/24/16; recert done 07/05/16   Authorization - Visit Number 8   Authorization - Number of Visits 15   PT Start Time 1117   PT Stop Time 1155   PT Time Calculation (min) 38 min   Activity Tolerance Patient tolerated treatment well   Behavior [facility] for tasks assessed/performed      Past Medical History:  Diagnosis Date  . Arthritis   . GERD (gastroesophageal reflux disease)    pt. states she no longer has GERD (09/11/15)  . Hyperglycemia    isolated FBS 122, 95, 104 (Random 138)  . Hyperlipidemia    on pravastatin  . Hypertension    controlled on 2 agents  . 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 Obesity   . Parkinson's disease (tremor, stiffness, slow motion, unstable posture) (HCC)   . Shortness of breath dyspnea    pt. states that she no longer has SOB )09/11/15  . Tremor     Past Surgical History:  Procedure Laterality Date  . CESAREAN SECTION    . COLONOSCOPY    . TOTAL KNEE ARTHROPLASTY Left 09/23/2015   Procedure: LEFT TOTAL KNEE ARTHROPLASTY;  Surgeon: Loreta Aveaniel F Murphy, MD;  [facility];  Service: Orthopedics;  Laterality: Left;    There were no vitals filed for this visit.      Subjective Assessment - 07/14/16 1122    Subjective (P)  Patient arrives today stating that her MD is happy with her progress with her ankle, is not going to give her any shot due to good progress. No falls or close  calls for either patient or her husband. Nothing else major going on.    Patient is accompained by: (P)  Family member   Pertinent History (P)  blood thinner (now currently taking per patient- will check with MD), HTN, GERD, DM, Parkinson, OA, SOB, hx of acute blood clot in January 2017; history of macular degeneration   Patient Stated Goals (P)  walking better, better mobility    Currently in Pain? (P)  Yes   Pain Score (P)  5                          [facility] Adult PT Treatment/Exercise - 07/14/16 0001      Ambulation/Gait   Ambulation/Gait Yes   Ambulation/Gait Assistance 5: Supervision   Ambulation/Gait Assistance Details 178   Assistive device Rollator   Gait Comments cues for heel-toe, step lengths, posture      Therapeutic Activites    Other Therapeutic Activities functional bed mobility skills      Manual Therapy   Manual Therapy Joint mobilization;Edema management;Soft tissue mobilization   Manual therapy comments separate from all other skilled services   Edema Management retrograde massage R foot/ankle    Joint Mobilization grade III mobilization of R foot supination/pronation, talar DF, calcaneal inversion/eversion   Soft  tissue mobilization STM R achillles distal tendon, cross-friction x5 minutes      Ankle Exercises: Stretches   Gastroc Stretch 3 reps;30 seconds   Other Stretch supine                 PT Education - 07/14/16 1205    Education provided Yes   Education Details importance of regular hydration and regular aerobic activity, go to podiatrist for toenail management   Person(s) Educated Patient   Methods Explanation   Comprehension Verbalized understanding          PT Short Term Goals - 07/05/16 1101      PT SHORT TERM GOAL #1   Title Patient to be able to complete all bed mobility with Min assist in order to demonstrate improved mobility and need for less assistance from family at home    Baseline 8/8- Mod-Max(A) for  rolling, Min-Mod(A) for supine<-->sit    Time 4   Period Weeks   Status On-going     PT SHORT TERM GOAL #2   Title Patient to be able to complete all stand to sit/sit to stand transfers from various height surfaces with Min assist in order to demonstrate improved mobility and need for less assistance from family at home    Baseline 8/8- getting better but has not tried low surfaces yet    Time 4   Period Weeks   Status On-going     PT SHORT TERM GOAL #3   Title Patient to demonstrate consistent improvements in gait pattern with LRAD, including minimal festination, increased step lengths/stance times, improved posture, and minimal instabilty in order to improve moblity at home and in community    Time 4   Period Weeks   Status On-going     PT SHORT TERM GOAL #4   Title Patient to demonstrate improved posture at least 70% of the time  in order to improve balance and overall mechanics of mobility    Time 4   Period Weeks   Status On-going     PT SHORT TERM GOAL #5   Title Patient to experience R ankle pain no more than 2/10 in order to improve QOL and increase tolerance of weight bearing activities    Baseline 8/8- 5/10 today, 7/10 on average    Time 4   Period Weeks   Status On-going     PT SHORT TERM GOAL #6   Title Patient to be independent in correctly and consistently performing appropriate HEP, to be updated PRN    Baseline 8/8- reports compliance but unable to recite or demonstrate HEP, states she had more from PT when she was only given 2    Period Weeks   Status On-going           PT Long Term Goals - 07/05/16 1104      PT LONG TERM GOAL #1   Title Patient to be able to complete all bed mobility and sit to stand/stand to sit transfers with LRAD and Mod(I) in order to demonstrate improved mobility and overall QOL    Time 8   Period Weeks   Status On-going     PT LONG TERM GOAL #2   Title Patient to be able to ambulate for at least 20 minutes with mechanics WFL,  LRAD, pain no more than 1/10 in order to demonstrate improved mobilty and overall QOL    Time 8   Period Weeks   Status On-going     PT LONG  TERM GOAL #3   Title Patient to demonstrate strength 4/5 in all tested muscle groups in order to assist in improving mobiltiy, reducing pain, and improving balance    Time 8   Period Weeks   Status On-going     PT LONG TERM GOAL #4   Title Patient to demonstrate full R ankle ROM and experience no more than occasional R ankle pain 1/10 in order to improve tolerance to mobility and assist in improving QOL    Time 8   Period Weeks   Status On-going     PT LONG TERM GOAL #5   Title Patient to be participatory in regular exercise at least 20 minutes in duration, of moderate intensity, at least 4 days per week, in order to maintain functional gains and assist in managing parkinsonian symptoms    Time 8   Period Weeks   Status On-going     PT LONG TERM GOAL #6   Title Patient to report no falls within the past 4 weeks in order to demonstrate improved safety at home and within community    Baseline 8/8- falls reported yesterday for both patient and her husband    Time 8   Period Weeks   Status On-going               Plan - 07/14/16 1206    Clinical Impression Statement Patient arrives today stating that her MD is very happy with the progress of her Achilles tendonitis and is not going to do any steroid shots now, recommends just continuing with PT for now. Continued with extensive manual to R ankle/foot and continued to work on improving functional bed mobility with patient continuing to need Min(A) and verbal cues for correct form. Also continued work on walking form with patient showing improved skill at maintaining consistent heel-toe pattern today, however her gait mechanics now do continue to drop in quality with fatigue. Education provided regarding importance of regular hydration and regular physical activity, also encouraged patient to  see Podiatrist for toenail management.    Rehab Potential Good   PT Frequency 2x / week   PT Duration 4 weeks   PT Treatment/Interventions ADLs/Self Care Home Management;Biofeedback;Gait training;Stair training;Functional mobility training;Therapeutic activities;Therapeutic exercise;Balance training;Neuromuscular re-education;Patient/family education;Manual techniques;Passive range of motion;Energy conservation;Taping   PT Next Visit Plan continue focus on achilles tendonitis, parkinsons on hold for now. Continue manual but begin to incorporate active OKC and CKC ankle exercises. Trial Nustep.    Consulted and Agree with Plan of Care Patient   Family Member Consulted spouse       Patient will benefit from skilled therapeutic intervention in order to improve the following deficits and impairments:  Abnormal gait, Decreased endurance, Hypomobility, Impaired tone, Decreased activity tolerance, Decreased strength, Pain, Decreased balance, Decreased mobility, Difficulty walking, Decreased range of motion, Improper body mechanics, Decreased coordination, Decreased safety awareness, Impaired flexibility, Postural dysfunction, Decreased knowledge of use of DME  Visit Diagnosis: Unsteadiness on feet  Difficulty in walking, not elsewhere classified  History of falling  Muscle weakness (generalized)  Abnormal posture  Pain in right ankle and joints of right foot  Stiffness of right ankle, not elsewhere classified     Problem List Patient Active Problem List   Diagnosis Date Noted  . Achilles tendinitis of right lower extremity 04/05/2016  . Hypertensive retinopathy of both eyes, grade 2 01/15/2016  . Left leg DVT (HCC) 12/18/2015  . 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 Diverticulosis 03/10/2015  .  Internal hemorrhoids 03/10/2015  . History of colon polyps 12/25/2014  . Health care maintenance 02/13/2014  . Subclinical hyperthyroidism 12/25/2013  . Right  renal mass 06/11/2012  . NSAID-associated gastropathy 06/06/2012  . Osteoarthritis of both knees 03/15/2012  . Parkinsonism (HCC) 07/13/2010  . Diabetes mellitus type 2, diet-controlled (HCC) 05/22/2009  . ALLERGIC RHINITIS, SEASONAL 02/20/2007  . Hyperlipidemia 09/27/2006  . Essential hypertension 09/27/2006  . GERD 09/27/2006    Nedra HaiKristen Unger PT, DPT [phone]  [facility] [address] [place], [place], [address] Phone: [phone]   Fax:  [phone]  Name: Nicole NgoRuby L Cheadle MRN: [id number] Date of Birth: 01/06/1941

## 2016-07-14 NOTE — Telephone Encounter (Signed)
Patient a no-show for today's session; called and left message, also attempted to reach mobile number however this appears to be wrong number.   Nedra HaiKristen Unger PT, DPT [phone]

## 2016-07-19 ENCOUNTER — Telehealth (HOSPITAL_COMMUNITY): Payer: Self-pay

## 2016-07-19 ENCOUNTER — Ambulatory Visit (HOSPITAL_COMMUNITY): Payer: PPO

## 2016-07-19 DIAGNOSIS — R262 Difficulty in walking, not elsewhere classified: Secondary | ICD-10-CM

## 2016-07-19 DIAGNOSIS — R2681 Unsteadiness on feet: Secondary | ICD-10-CM

## 2016-07-19 DIAGNOSIS — M6281 Muscle weakness (generalized): Secondary | ICD-10-CM

## 2016-07-19 DIAGNOSIS — Z9181 History of falling: Secondary | ICD-10-CM

## 2016-07-19 NOTE — Telephone Encounter (Signed)
[place]

## 2016-07-19 NOTE — Therapy (Signed)
[place] [facility] [address] [place], [place], [address] Phone: [phone]   Fax:  [phone]  Physical Therapy Treatment  Patient Details  Name: Nicole Baxter MRN: [id number] Date of Birth: 01/29/1941 Referring Provider: Kirtland BouchardK Flores/Alex Elson ClanM Wilson  Encounter Date: 07/19/2016      PT End of Session - 07/19/16 1046    Visit Number 9   Number of Visits 13   Date for PT Re-Evaluation 08/02/16   Authorization Type [employer] (G-codes done 5th visit)   Authorization Time Period 05/24/16 to 07/24/16; recert done 07/05/16   Authorization - Visit Number 9   Authorization - Number of Visits 15   PT Start Time 1035   PT Stop Time 1118   PT Time Calculation (min) 43 min   Equipment Utilized During Treatment Gait belt   Activity Tolerance Patient tolerated treatment well   Behavior During Therapy [facility] for tasks assessed/performed      Past Medical History:  Diagnosis Date  . Arthritis   . GERD (gastroesophageal reflux disease)    pt. states she no longer has GERD (09/11/15)  . Hyperglycemia    isolated FBS 122, 95, 104 (Random 138)  . Hyperlipidemia    on pravastatin  . Hypertension    controlled on 2 agents  . 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 Obesity   . Parkinson's disease (tremor, stiffness, slow motion, unstable posture) (HCC)   . Shortness of breath dyspnea    pt. states that she no longer has SOB )09/11/15  . Tremor     Past Surgical History:  Procedure Laterality Date  . CESAREAN SECTION    . COLONOSCOPY    . TOTAL KNEE ARTHROPLASTY Left 09/23/2015   Procedure: LEFT TOTAL KNEE ARTHROPLASTY;  Surgeon: Loreta Aveaniel F Murphy, MD;  [facility];  Service: Orthopedics;  Laterality: Left;    There were no vitals filed for this visit.      Subjective Assessment - 07/19/16 1033    Subjective Pt stated pain minimal today Rt ankle, pain scale 3/10.   Pertinent History blood thinner (now currently taking per  patient- will check with MD), HTN, GERD, DM, Parkinson, OA, SOB, hx of acute blood clot in January 2017; history of macular degeneration   Patient Stated Goals walking better, better mobility    Currently in Pain? Yes   Pain Score 3    Pain Location Foot   Pain Orientation Right   Pain Descriptors / Indicators --  "mild pain today"   Pain Type Chronic pain   Pain Radiating Towards goes to heel   Pain Onset More than a month ago   Pain Frequency Constant   Aggravating Factors  trying to walk   Pain Relieving Factors staying off foot   Effect of Pain on Daily Activities painful to walk                         [facility] - 07/19/16 0001      Ambulation/Gait   Ambulation/Gait Yes   Ambulation/Gait Assistance 5: Supervision   Ambulation/Gait Assistance Details 1  2 RT   Ambulation Distance (Feet) 120 Feet  2 sets x 60 ft'   Assistive device Rollator   Gait Comments cues for heel-toe, step lengths, posture      Manual Therapy   Manual Therapy Joint mobilization;Edema management;Soft tissue mobilization   Manual therapy comments separate from all other  skilled services   Edema Management retrograde massage R foot/ankle    Joint Mobilization grade III mobilization of R foot supination/pronation, talar DF, calcaneal inversion/eversion   Soft tissue mobilization STM R achillles distal tendon, cross-friction x5 minutes      Ankle Exercises: Stretches   Gastroc Stretch 3 reps;30 seconds   Other Stretch supine      Ankle Exercises: Supine   T-Band RTB 10x each direction with tactile cueing/guiding     Ankle Exercises: Standing   Heel Raises 10 reps  catch and throw with heel raise   Other Standing Ankle Exercises lateral reach and rotate cone   Other Standing Ankle Exercises 6in hurdles alternating 1RT                  PT Short Term Goals - 07/05/16 1101      PT SHORT TERM GOAL #1   Title Patient to be able to complete all bed  mobility with Min assist in order to demonstrate improved mobility and need for less assistance from family at home    Baseline 8/8- Mod-Max(A) for rolling, Min-Mod(A) for supine<-->sit    Time 4   Period Weeks   Status On-going     PT SHORT TERM GOAL #2   Title Patient to be able to complete all stand to sit/sit to stand transfers from various height surfaces with Min assist in order to demonstrate improved mobility and need for less assistance from family at home    Baseline 8/8- getting better but has not tried low surfaces yet    Time 4   Period Weeks   Status On-going     PT SHORT TERM GOAL #3   Title Patient to demonstrate consistent improvements in gait pattern with LRAD, including minimal festination, increased step lengths/stance times, improved posture, and minimal instabilty in order to improve moblity at home and in community    Time 4   Period Weeks   Status On-going     PT SHORT TERM GOAL #4   Title Patient to demonstrate improved posture at least 70% of the time  in order to improve balance and overall mechanics of mobility    Time 4   Period Weeks   Status On-going     PT SHORT TERM GOAL #5   Title Patient to experience R ankle pain no more than 2/10 in order to improve QOL and increase tolerance of weight bearing activities    Baseline 8/8- 5/10 today, 7/10 on average    Time 4   Period Weeks   Status On-going     PT SHORT TERM GOAL #6   Title Patient to be independent in correctly and consistently performing appropriate HEP, to be updated PRN    Baseline 8/8- reports compliance but unable to recite or demonstrate HEP, states she had more from PT when she was only given 2    Period Weeks   Status On-going           PT Long Term Goals - 07/05/16 1104      PT LONG TERM GOAL #1   Title Patient to be able to complete all bed mobility and sit to stand/stand to sit transfers with LRAD and Mod(I) in order to demonstrate improved mobility and overall QOL     Time 8   Period Weeks   Status On-going     PT LONG TERM GOAL #2   Title Patient to be able to ambulate for at least 20 minutes  with mechanics WFL, LRAD, pain no more than 1/10 in order to demonstrate improved mobilty and overall QOL    Time 8   Period Weeks   Status On-going     PT LONG TERM GOAL #3   Title Patient to demonstrate strength 4/5 in all tested muscle groups in order to assist in improving mobiltiy, reducing pain, and improving balance    Time 8   Period Weeks   Status On-going     PT LONG TERM GOAL #4   Title Patient to demonstrate full R ankle ROM and experience no more than occasional R ankle pain 1/10 in order to improve tolerance to mobility and assist in improving QOL    Time 8   Period Weeks   Status On-going     PT LONG TERM GOAL #5   Title Patient to be participatory in regular exercise at least 20 minutes in duration, of moderate intensity, at least 4 days per week, in order to maintain functional gains and assist in managing parkinsonian symptoms    Time 8   Period Weeks   Status On-going     PT LONG TERM GOAL #6   Title Patient to report no falls within the past 4 weeks in order to demonstrate improved safety at home and within community    Baseline 8/8- falls reported yesterday for both patient and her husband    Time 8   Period Weeks   Status On-going               Plan - 07/19/16 1304    Clinical Impression Statement Began session with manual soft tissue mobilization technqiues to address Rt ankle/foot edema, mobility and pain control.  Pt educated on importance of going to podiatrist for toenail managements for health.  Gait training complete with rollator this session with min A for safety and verbal cueing to improve posture and increase stride length to improve gait mechanics.  Her gait mechanics continue to drop in quality wiht fatigue.  Progressed to standing balance activities to improve reaching out BOS, response time and hurdles for  higher level balance training, min A and manual assistance required with some of the exercises.     Rehab Potential Good   PT Frequency 2x / week   PT Duration 4 weeks   PT Treatment/Interventions ADLs/Self Care Home Management;Biofeedback;Gait training;Stair training;Functional mobility training;Therapeutic activities;Therapeutic exercise;Balance training;Neuromuscular re-education;Patient/family education;Manual techniques;Passive range of motion;Energy conservation;Taping   PT Next Visit Plan continue focus on achilles tendonitis, parkinsons on hold for now. Continue manual but begin to incorporate active OKC and CKC ankle exercises. Trial Nustep.       Patient will benefit from skilled therapeutic intervention in order to improve the following deficits and impairments:  Abnormal gait, Decreased endurance, Hypomobility, Impaired tone, Decreased activity tolerance, Decreased strength, Pain, Decreased balance, Decreased mobility, Difficulty walking, Decreased range of motion, Improper body mechanics, Decreased coordination, Decreased safety awareness, Impaired flexibility, Postural dysfunction, Decreased knowledge of use of DME  Visit Diagnosis: Unsteadiness on feet  Difficulty in walking, not elsewhere classified  History of falling  Muscle weakness (generalized)     Problem List Patient Active Problem List   Diagnosis Date Noted  . Achilles tendinitis of right lower extremity 04/05/2016  . Hypertensive retinopathy of both eyes, grade 2 01/15/2016  . Left leg DVT (HCC) 12/18/2015  . 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 Diverticulosis 03/10/2015  . Internal hemorrhoids 03/10/2015  . History of colon polyps 12/25/2014  .  Health care maintenance 02/13/2014  . Subclinical hyperthyroidism 12/25/2013  . Right renal mass 06/11/2012  . NSAID-associated gastropathy 06/06/2012  . Osteoarthritis of both knees 03/15/2012  . Parkinsonism (HCC) 07/13/2010  .  Diabetes mellitus type 2, diet-controlled (HCC) 05/22/2009  . ALLERGIC RHINITIS, SEASONAL 02/20/2007  . Hyperlipidemia 09/27/2006  . Essential hypertension 09/27/2006  . GERD 09/27/2006   Becky Saxasey Cockerham, LPTA; CBIS [phone]  Juel BurrowCockerham, Casey Jo 07/19/2016, 1:14 PM  [facility] [address] [place], [place], [address] Phone: [phone]   Fax:  [phone]  Name: Nicole Baxter MRN: [id number] Date of Birth: 03/19/1941

## 2016-07-20 ENCOUNTER — Ambulatory Visit (HOSPITAL_COMMUNITY): Payer: PPO | Admitting: Physical Therapy

## 2016-07-20 DIAGNOSIS — R2681 Unsteadiness on feet: Secondary | ICD-10-CM

## 2016-07-20 DIAGNOSIS — M6281 Muscle weakness (generalized): Secondary | ICD-10-CM

## 2016-07-20 DIAGNOSIS — M25671 Stiffness of right ankle, not elsewhere classified: Secondary | ICD-10-CM

## 2016-07-20 DIAGNOSIS — R262 Difficulty in walking, not elsewhere classified: Secondary | ICD-10-CM

## 2016-07-20 DIAGNOSIS — M25571 Pain in right ankle and joints of right foot: Secondary | ICD-10-CM

## 2016-07-20 DIAGNOSIS — R293 Abnormal posture: Secondary | ICD-10-CM

## 2016-07-20 DIAGNOSIS — Z9181 History of falling: Secondary | ICD-10-CM

## 2016-07-20 NOTE — Therapy (Signed)
[place] [facility] [address] [place], [place], [address] Phone: [phone]   Fax:  [phone]  Physical Therapy Treatment  Patient Details  Name: Nicole Baxter MRN: [id number] Date of Birth: 11/11/1941 Referring Provider: Kirtland BouchardK Flores/Alex Elson ClanM Wilson  Encounter Date: 07/20/2016      PT End of Session - 07/20/16 1218    Visit Number 10   Number of Visits 13   Date for PT Re-Evaluation 08/02/16   Authorization Type [employer] (G-codes done 5th visit)   Authorization Time Period 05/24/16 to 07/24/16; recert done 07/05/16   Authorization - Visit Number 10   Authorization - Number of Visits 15   PT Start Time 1118   PT Stop Time 1158   PT Time Calculation (min) 40 min   Activity Tolerance Patient tolerated treatment well   Behavior During Therapy [facility] for tasks assessed/performed      Past Medical History:  Diagnosis Date  . Arthritis   . GERD (gastroesophageal reflux disease)    pt. states she no longer has GERD (09/11/15)  . Hyperglycemia    isolated FBS 122, 95, 104 (Random 138)  . Hyperlipidemia    on pravastatin  . Hypertension    controlled on 2 agents  . 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 Obesity   . Parkinson's disease (tremor, stiffness, slow motion, unstable posture) (HCC)   . Shortness of breath dyspnea    pt. states that she no longer has SOB )09/11/15  . Tremor     Past Surgical History:  Procedure Laterality Date  . CESAREAN SECTION    . COLONOSCOPY    . TOTAL KNEE ARTHROPLASTY Left 09/23/2015   Procedure: LEFT TOTAL KNEE ARTHROPLASTY;  Surgeon: Loreta Aveaniel F Murphy, MD;  [facility];  Service: Orthopedics;  Laterality: Left;    There were no vitals filed for this visit.      Subjective Assessment - 07/20/16 1123    Subjective Patient reports that her R LE started hurting last night, she cannot remember what but states that she is having general soreness throughout her legs. No falls since  last time she came. Nothing else major going on.    Patient is accompained by: Family member   Pertinent History blood thinner (now currently taking per patient- will check with MD), HTN, GERD, DM, Parkinson, OA, SOB, hx of acute blood clot in January 2017; history of macular degeneration   Patient Stated Goals walking better, better mobility    Currently in Pain? Yes   Pain Score 5    Pain Location Leg   Pain Orientation Right;Left   Pain Descriptors / Indicators Sore                         [facility] Adult PT Treatment/Exercise - 07/20/16 0001      Ambulation/Gait   Ambulation/Gait Yes   Ambulation/Gait Assistance 5: Supervision   Ambulation Distance (Feet) 182 Feet   Assistive device Rollator   Gait Comments cues for heel-toe and posture, reduced scuffling during gait      Manual Therapy   Manual Therapy Joint mobilization;Soft tissue mobilization   Manual therapy comments separate from all other skilled services   Joint Mobilization grade III mobilzation of R foot supination/pronation, talar DF, calcaneal inversoin/eversion    Soft tissue mobilization STM R achillles distal tendon, cross-friction x5 minutes      Ankle Exercises: Stretches   Gastroc Stretch 3  reps;30 seconds   Other Stretch supine      Ankle Exercises: Seated   BAPS Sitting;Level 2;Other (comment)  CW and CCW, mod facilitation of form    Other Seated Ankle Exercises rocker board AP and inversion/eversion R foot, min-mod facilitation of form                 PT Education - 07/20/16 1217    Education provided Yes   Education Details progression of therapy to inculde ankle exercise as well as more incorporation of parkinsons based activities    Person(s) Educated Patient   Methods Explanation   Comprehension Verbalized understanding          PT Short Term Goals - 07/05/16 1101      PT SHORT TERM GOAL #1   Title Patient to be able to complete all bed mobility with Min assist in  order to demonstrate improved mobility and need for less assistance from family at home    Baseline 8/8- Mod-Max(A) for rolling, Min-Mod(A) for supine<-->sit    Time 4   Period Weeks   Status On-going     PT SHORT TERM GOAL #2   Title Patient to be able to complete all stand to sit/sit to stand transfers from various height surfaces with Min assist in order to demonstrate improved mobility and need for less assistance from family at home    Baseline 8/8- getting better but has not tried low surfaces yet    Time 4   Period Weeks   Status On-going     PT SHORT TERM GOAL #3   Title Patient to demonstrate consistent improvements in gait pattern with LRAD, including minimal festination, increased step lengths/stance times, improved posture, and minimal instabilty in order to improve moblity at home and in community    Time 4   Period Weeks   Status On-going     PT SHORT TERM GOAL #4   Title Patient to demonstrate improved posture at least 70% of the time  in order to improve balance and overall mechanics of mobility    Time 4   Period Weeks   Status On-going     PT SHORT TERM GOAL #5   Title Patient to experience R ankle pain no more than 2/10 in order to improve QOL and increase tolerance of weight bearing activities    Baseline 8/8- 5/10 today, 7/10 on average    Time 4   Period Weeks   Status On-going     PT SHORT TERM GOAL #6   Title Patient to be independent in correctly and consistently performing appropriate HEP, to be updated PRN    Baseline 8/8- reports compliance but unable to recite or demonstrate HEP, states she had more from PT when she was only given 2    Period Weeks   Status On-going           PT Long Term Goals - 07/05/16 1104      PT LONG TERM GOAL #1   Title Patient to be able to complete all bed mobility and sit to stand/stand to sit transfers with LRAD and Mod(I) in order to demonstrate improved mobility and overall QOL    Time 8   Period Weeks    Status On-going     PT LONG TERM GOAL #2   Title Patient to be able to ambulate for at least 20 minutes with mechanics WFL, LRAD, pain no more than 1/10 in order to demonstrate improved mobilty and overall  QOL    Time 8   Period Weeks   Status On-going     PT LONG TERM GOAL #3   Title Patient to demonstrate strength 4/5 in all tested muscle groups in order to assist in improving mobiltiy, reducing pain, and improving balance    Time 8   Period Weeks   Status On-going     PT LONG TERM GOAL #4   Title Patient to demonstrate full R ankle ROM and experience no more than occasional R ankle pain 1/10 in order to improve tolerance to mobility and assist in improving QOL    Time 8   Period Weeks   Status On-going     PT LONG TERM GOAL #5   Title Patient to be participatory in regular exercise at least 20 minutes in duration, of moderate intensity, at least 4 days per week, in order to maintain functional gains and assist in managing parkinsonian symptoms    Time 8   Period Weeks   Status On-going     PT LONG TERM GOAL #6   Title Patient to report no falls within the past 4 weeks in order to demonstrate improved safety at home and within community    Baseline 8/8- falls reported yesterday for both patient and her husband    Time 8   Period Weeks   Status On-going               Plan - 07/20/16 1219    Clinical Impression Statement Continued with manual to R ankle today to address limited ROM and pain related to Achilles tendonitis; also performed work on [employer] and rockerboards in sitting with min-mod facilitation for correct form and isolation of ankle rather than use of entire LE for motions. Patient demonstrates excellent improvement in her gait mechanics, with more consistent heel-toe pattern and improved step lengths, also reduced cues from PT currently. Patient appears to be doing quite well overall and at this time am recommending that sessions progress to incorporate both PT  for ankle and Parkinsonism/PWR training moving forward.    Rehab Potential Good   PT Frequency 2x / week   PT Duration 4 weeks   PT Treatment/Interventions ADLs/Self Care Home Management;Biofeedback;Gait training;Stair training;Functional mobility training;Therapeutic activities;Therapeutic exercise;Balance training;Neuromuscular re-education;Patient/family education;Manual techniques;Passive range of motion;Energy conservation;Taping   PT Next Visit Plan incorporate achilles tendonitis and PWR training into sessions. Continue [employer] and [facility] ankle work. Trial Nustep.    Consulted and Agree with Plan of Care Patient   Family Member Consulted spouse       Patient will benefit from skilled therapeutic intervention in order to improve the following deficits and impairments:  Abnormal gait, Decreased endurance, Hypomobility, Impaired tone, Decreased activity tolerance, Decreased strength, Pain, Decreased balance, Decreased mobility, Difficulty walking, Decreased range of motion, Improper body mechanics, Decreased coordination, Decreased safety awareness, Impaired flexibility, Postural dysfunction, Decreased knowledge of use of DME  Visit Diagnosis: Unsteadiness on feet  Difficulty in walking, not elsewhere classified  History of falling  Muscle weakness (generalized)  Abnormal posture  Pain in right ankle and joints of right foot  Stiffness of right ankle, not elsewhere classified     Problem List Patient Active Problem List   Diagnosis Date Noted  . Achilles tendinitis of right lower extremity 04/05/2016  . Hypertensive retinopathy of both eyes, grade 2 01/15/2016  . Left leg DVT (HCC) 12/18/2015  . 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 Diverticulosis 03/10/2015  . Internal hemorrhoids 03/10/2015  .  History of colon polyps 12/25/2014  . Health care maintenance 02/13/2014  . Subclinical hyperthyroidism 12/25/2013  . Right renal mass 06/11/2012  .  NSAID-associated gastropathy 06/06/2012  . Osteoarthritis of both knees 03/15/2012  . Parkinsonism (HCC) 07/13/2010  . Diabetes mellitus type 2, diet-controlled (HCC) 05/22/2009  . ALLERGIC RHINITIS, SEASONAL 02/20/2007  . Hyperlipidemia 09/27/2006  . Essential hypertension 09/27/2006  . GERD 09/27/2006    Nedra Hai PT, DPT [phone]  [facility] [address] [place], [place], [address] Phone: [phone]   Fax:  [phone]  Name: Nicole Baxter MRN: [id number] Date of Birth: 1941/05/20

## 2016-07-21 ENCOUNTER — Encounter (HOSPITAL_COMMUNITY): Payer: PPO

## 2016-07-22 ENCOUNTER — Ambulatory Visit (INDEPENDENT_AMBULATORY_CARE_PROVIDER_SITE_OTHER): Payer: PPO | Admitting: Family Medicine

## 2016-07-22 DIAGNOSIS — M7661 Achilles tendinitis, right leg: Secondary | ICD-10-CM

## 2016-07-22 MED ORDER — DICLOFENAC SODIUM 1 % TD GEL: TRANSDERMAL | 1 refills | Status: DC

## 2016-07-22 NOTE — Assessment & Plan Note (Signed)
60% improvement with that glycerin patch for 3-4 weeks. We'll continue for another 4 weeks. If she's not having almost total resolution by that time, she can return to clinic and we would extend probably for full 12 weeks. I did give her to additional small heel lift. If she's totally resolved in the next 4 weeks she can cancel the appointment and follow-up when necessary.

## 2016-07-22 NOTE — Progress Notes (Signed)
    CHIEF COMPLAINT / HPI:  Right Achilles pain 60% better by her report. She's been wearing the small heel lift and using the nitroglycerin patch. She's very excited to be is much improved. She's had no problems with the patch, causing no headaches, no skin rash.  REVIEW OF SYSTEMS:  See history of present illness  OBJECTIVE:  Vital signs are reviewed.   GEN.: Well-developed female no acute distress ANKLE: Right. Full range of motion dorsiflexion plantar flexion.. Mild tenderness to palpation over the Achilles tendon and there is a soft tissue enlargement there which was present at last office visit. The area is much less tender however.  ASSESSMENT / PLAN:

## 2016-07-26 ENCOUNTER — Ambulatory Visit (HOSPITAL_COMMUNITY): Payer: PPO | Admitting: Physical Therapy

## 2016-07-26 DIAGNOSIS — M6281 Muscle weakness (generalized): Secondary | ICD-10-CM

## 2016-07-26 DIAGNOSIS — R262 Difficulty in walking, not elsewhere classified: Secondary | ICD-10-CM

## 2016-07-26 DIAGNOSIS — R2681 Unsteadiness on feet: Secondary | ICD-10-CM | POA: Diagnosis not present

## 2016-07-26 DIAGNOSIS — M25671 Stiffness of right ankle, not elsewhere classified: Secondary | ICD-10-CM

## 2016-07-26 DIAGNOSIS — R293 Abnormal posture: Secondary | ICD-10-CM

## 2016-07-26 DIAGNOSIS — Z9181 History of falling: Secondary | ICD-10-CM

## 2016-07-26 DIAGNOSIS — M25571 Pain in right ankle and joints of right foot: Secondary | ICD-10-CM

## 2016-07-26 NOTE — Therapy (Signed)
[facility] [address] [place], [place], [address] Phone: [phone]   Fax:  [phone]  Physical Therapy Treatment  Patient Details  Name: Nicole Baxter MRN: [id number] Date of Birth: 03/04/1941 Referring Provider: Kirtland BouchardK Flores/Alex Elson ClanM Wilson  Encounter Date: 07/26/2016      PT End of Session - 07/26/16 1051    Visit Number 11   Number of Visits 13   Date for PT Re-Evaluation 08/02/16   Authorization Type [employer] (G-codes done 5th visit)   Authorization Time Period 05/24/16 to 07/24/16; recert done 07/05/16   Authorization - Visit Number 11   Authorization - Number of Visits 15   PT Start Time 0945   PT Stop Time 1040   PT Time Calculation (min) 55 min   Equipment Utilized During Treatment Gait belt   Activity Tolerance Patient tolerated treatment well   Behavior During Therapy [facility] for tasks assessed/performed      Past Medical History:  Diagnosis Date  . Arthritis   . GERD (gastroesophageal reflux disease)    pt. states she no longer has GERD (09/11/15)  . Hyperglycemia    isolated FBS 122, 95, 104 (Random 138)  . Hyperlipidemia    on pravastatin  . Hypertension    controlled on 2 agents  . 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 Obesity   . Parkinson's disease (tremor, stiffness, slow motion, unstable posture) (HCC)   . Shortness of breath dyspnea    pt. states that she no longer has SOB )09/11/15  . Tremor     Past Surgical History:  Procedure Laterality Date  . CESAREAN SECTION    . COLONOSCOPY    . TOTAL KNEE ARTHROPLASTY Left 09/23/2015   Procedure: LEFT TOTAL KNEE ARTHROPLASTY;  Surgeon: Loreta Aveaniel F Murphy, MD;  [facility];  Service: Orthopedics;  Laterality: Left;    There were no vitals filed for this visit.      Subjective Assessment - 07/26/16 0951    Subjective (P)  Patient arrives stating that while she does have some tingling in teh back of her R LE she has no pain today;  no falls or close calls recently. She went to the MD the other day and they are still happy with how she's doing.    Pertinent History (P)  blood thinner (now currently taking per patient- will check with MD), HTN, GERD, DM, Parkinson, OA, SOB, hx of acute blood clot in January 2017; history of macular degeneration   Patient Stated Goals (P)  walking better, better mobility    Currently in Pain? (P)  No/denies                         [facility] Adult PT Treatment/Exercise - 07/26/16 0001      Ambulation/Gait   Ambulation/Gait Yes   Ambulation/Gait Assistance 5: Supervision   Ambulation Distance (Feet) --  1442ft, 2642ft    Assistive device Rollator   Gait Comments cues for step length      Manual Therapy   Manual Therapy Joint mobilization;Soft tissue mobilization   Manual therapy comments separate from all other skilled services   Joint Mobilization grade III mobilzation of R foot supination/pronation, talar DF, calcaneal inversoin/eversion    Soft tissue mobilization STM R achillles distal tendon, cross-friction x5 minutes      Ankle Exercises: Supine   T-Band red TB ankle DF 1x15; inversion/eversion no resistance, tactile guidance  PWR [facility]) - 07/26/16 1047    PWR! Multi-Directional Movements with Dual Tasks boom whacker drill (cross-midline) with min guard, progressively reducing BOS    PWR! Up 1x8 seated with ball/cone    PWR! Rock 1x10, seated with targets    PWR! Twist 1x10, seated with min-mod facilitation of form    PWR! Step 1x10, seated with hurdles/cones              PT Education - 07/26/16 1051    Education provided No          PT Short Term Goals - 07/05/16 1101      PT SHORT TERM GOAL #1   Title Patient to be able to complete all bed mobility with Min assist in order to demonstrate improved mobility and need for less assistance from family at home    Baseline 8/8- Mod-Max(A) for rolling, Min-Mod(A) for supine<-->sit    Time 4    Period Weeks   Status On-going     PT SHORT TERM GOAL #2   Title Patient to be able to complete all stand to sit/sit to stand transfers from various height surfaces with Min assist in order to demonstrate improved mobility and need for less assistance from family at home    Baseline 8/8- getting better but has not tried low surfaces yet    Time 4   Period Weeks   Status On-going     PT SHORT TERM GOAL #3   Title Patient to demonstrate consistent improvements in gait pattern with LRAD, including minimal festination, increased step lengths/stance times, improved posture, and minimal instabilty in order to improve moblity at home and in community    Time 4   Period Weeks   Status On-going     PT SHORT TERM GOAL #4   Title Patient to demonstrate improved posture at least 70% of the time  in order to improve balance and overall mechanics of mobility    Time 4   Period Weeks   Status On-going     PT SHORT TERM GOAL #5   Title Patient to experience R ankle pain no more than 2/10 in order to improve QOL and increase tolerance of weight bearing activities    Baseline 8/8- 5/10 today, 7/10 on average    Time 4   Period Weeks   Status On-going     PT SHORT TERM GOAL #6   Title Patient to be independent in correctly and consistently performing appropriate HEP, to be updated PRN    Baseline 8/8- reports compliance but unable to recite or demonstrate HEP, states she had more from PT when she was only given 2    Period Weeks   Status On-going           PT Long Term Goals - 07/05/16 1104      PT LONG TERM GOAL #1   Title Patient to be able to complete all bed mobility and sit to stand/stand to sit transfers with LRAD and Mod(I) in order to demonstrate improved mobility and overall QOL    Time 8   Period Weeks   Status On-going     PT LONG TERM GOAL #2   Title Patient to be able to ambulate for at least 20 minutes with mechanics WFL, LRAD, pain no more than 1/10 in order to  demonstrate improved mobilty and overall QOL    Time 8   Period Weeks   Status On-going     PT LONG TERM GOAL #  3   Title Patient to demonstrate strength 4/5 in all tested muscle groups in order to assist in improving mobiltiy, reducing pain, and improving balance    Time 8   Period Weeks   Status On-going     PT LONG TERM GOAL #4   Title Patient to demonstrate full R ankle ROM and experience no more than occasional R ankle pain 1/10 in order to improve tolerance to mobility and assist in improving QOL    Time 8   Period Weeks   Status On-going     PT LONG TERM GOAL #5   Title Patient to be participatory in regular exercise at least 20 minutes in duration, of moderate intensity, at least 4 days per week, in order to maintain functional gains and assist in managing parkinsonian symptoms    Time 8   Period Weeks   Status On-going     PT LONG TERM GOAL #6   Title Patient to report no falls within the past 4 weeks in order to demonstrate improved safety at home and within community    Baseline 8/8- falls reported yesterday for both patient and her husband    Time 8   Period Weeks   Status On-going               Plan - 07/26/16 1051    Clinical Impression Statement Began session with general manual techniques to R ankle/foot, including primarily joint mobilization and soft tissue work to general achillles area; also continued with supine ankle exercises for which patient continues to require tactile cues/guidance for correct form with inversion/eversion. Re-initiated PWR! Exercises today, beginning with seated drills and proceeding to standing exercise with boom whackers with progressive reductions in BOS, min guard provided throughout. Patient requires cues for proper form throughout session and demonstrates signs of fatigue at end of session including some deterioration of correct gait mechanics (however this has been improving for patient as a whole).    Rehab Potential Good    PT Frequency 2x / week   PT Duration 4 weeks   PT Treatment/Interventions ADLs/Self Care Home Management;Biofeedback;Gait training;Stair training;Functional mobility training;Therapeutic activities;Therapeutic exercise;Balance training;Neuromuscular re-education;Patient/family education;Manual techniques;Passive range of motion;Energy conservation;Taping   PT Next Visit Plan Increase focus on ankle exercises and PWR! drills.  Decrease focus on manual unless needed for pain reduction/control.    Consulted and Agree with Plan of Care Patient   Family Member Consulted spouse       Patient will benefit from skilled therapeutic intervention in order to improve the following deficits and impairments:  Abnormal gait, Decreased endurance, Hypomobility, Impaired tone, Decreased activity tolerance, Decreased strength, Pain, Decreased balance, Decreased mobility, Difficulty walking, Decreased range of motion, Improper body mechanics, Decreased coordination, Decreased safety awareness, Impaired flexibility, Postural dysfunction, Decreased knowledge of use of DME  Visit Diagnosis: Unsteadiness on feet  Difficulty in walking, not elsewhere classified  History of falling  Muscle weakness (generalized)  Abnormal posture  Pain in right ankle and joints of right foot  Stiffness of right ankle, not elsewhere classified     Problem List Patient Active Problem List   Diagnosis Date Noted  . Achilles tendinitis of right lower extremity 04/05/2016  . Hypertensive retinopathy of both eyes, grade 2 01/15/2016  . Left leg DVT (HCC) 12/18/2015  . 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 Diverticulosis 03/10/2015  . Internal hemorrhoids 03/10/2015  . History of colon polyps 12/25/2014  . Health care maintenance 02/13/2014  . Subclinical hyperthyroidism 12/25/2013  .  Right renal mass 06/11/2012  . NSAID-associated gastropathy 06/06/2012  . Osteoarthritis of both knees 03/15/2012   . Parkinsonism (HCC) 07/13/2010  . Diabetes mellitus type 2, diet-controlled (HCC) 05/22/2009  . ALLERGIC RHINITIS, SEASONAL 02/20/2007  . Hyperlipidemia 09/27/2006  . Essential hypertension 09/27/2006  . GERD 09/27/2006    Nedra Hai PT, DPT [phone]  [facility] [address] [place], [place], [address] Phone: [phone]   Fax:  [phone]  Name: Nicole Baxter MRN: [id number] Date of Birth: [birth year]

## 2016-07-28 ENCOUNTER — Ambulatory Visit (HOSPITAL_COMMUNITY): Payer: PPO

## 2016-07-28 DIAGNOSIS — R2681 Unsteadiness on feet: Secondary | ICD-10-CM

## 2016-07-28 DIAGNOSIS — M6281 Muscle weakness (generalized): Secondary | ICD-10-CM

## 2016-07-28 DIAGNOSIS — R293 Abnormal posture: Secondary | ICD-10-CM

## 2016-07-28 DIAGNOSIS — M25571 Pain in right ankle and joints of right foot: Secondary | ICD-10-CM

## 2016-07-28 DIAGNOSIS — R262 Difficulty in walking, not elsewhere classified: Secondary | ICD-10-CM

## 2016-07-28 DIAGNOSIS — Z9181 History of falling: Secondary | ICD-10-CM

## 2016-07-28 DIAGNOSIS — M25671 Stiffness of right ankle, not elsewhere classified: Secondary | ICD-10-CM

## 2016-07-28 NOTE — Therapy (Signed)
[facility] [address] [place], [place], [address] Phone: [phone]   Fax:  [phone]  Physical Therapy Treatment  Patient Details  Name: Nicole Baxter MRN: [id number] Date of Birth: 10-22-1941 Referring Provider: Kirtland Bouchard Flores/Alex Elson Clan  Encounter Date: 07/28/2016      PT End of Session - 07/28/16 1136    Visit Number 12   Number of Visits 13   Date for PT Re-Evaluation 08/02/16   Authorization Type [employer] (G-codes done 5th visit)   Authorization Time Period 05/24/16 to 07/24/16; recert done 07/05/16   Authorization - Visit Number 12   Authorization - Number of Visits 15   PT Start Time 1034   PT Stop Time 1123   PT Time Calculation (min) 49 min   Equipment Utilized During Treatment Gait belt   Activity Tolerance Patient tolerated treatment well;No increased pain   Behavior During Therapy WFL for tasks assessed/performed      Past Medical History:  Diagnosis Date  . Arthritis   . GERD (gastroesophageal reflux disease)    pt. states she no longer has GERD (09/11/15)  . Hyperglycemia    isolated FBS 122, 95, 104 (Random 138)  . Hyperlipidemia    on pravastatin  . Hypertension    controlled on 2 agents  . 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 Obesity   . Parkinson's disease (tremor, stiffness, slow motion, unstable posture) (HCC)   . Shortness of breath dyspnea    pt. states that she no longer has SOB )09/11/15  . Tremor     Past Surgical History:  Procedure Laterality Date  . CESAREAN SECTION    . COLONOSCOPY    . TOTAL KNEE ARTHROPLASTY Left 09/23/2015   Procedure: LEFT TOTAL KNEE ARTHROPLASTY;  Surgeon: Loreta Ave, MD;  Location: [facility];  Service: Orthopedics;  Laterality: Left;    There were no vitals filed for this visit.      Subjective Assessment - 07/28/16 1048    Subjective Pt stated her Rt knee and ankkle are bothering her today, feels releated to the rainy clody  weather.   Pertinent History blood thinner (now currently taking per patient- will check with MD), HTN, GERD, DM, Parkinson, OA, SOB, hx of acute blood clot in January 2017; history of macular degeneration   Patient Stated Goals walking better, better mobility    Currently in Pain? Yes   Pain Score 7    Pain Location Leg   Pain Orientation Right   Pain Descriptors / Indicators Sore   Pain Type Chronic pain   Pain Radiating Towards goes to heel   Pain Frequency Constant   Aggravating Factors  trying tonwalk   Pain Relieving Factors staying off foot   Effect of Pain on Daily Activities painful to walk                         [facility] Adult PT Treatment/Exercise - 07/28/16 0001      Ambulation/Gait   Ambulation/Gait Yes   Ambulation/Gait Assistance 5: Supervision   Ambulation Distance (Feet) 200 Feet   Assistive device Rollator   Gait Comments cues for step length      Manual Therapy   Manual Therapy Joint mobilization;Soft tissue mobilization   Manual therapy comments separate from all other skilled services   Joint Mobilization grade III mobilzation of R foot supination/pronation, talar DF, calcaneal inversoin/eversion    Soft tissue  mobilization STM R achillles distal tendon, cross-friction x5 minutes      Ankle Exercises: Supine   T-Band RTB 15x with dorsi and plantarflexion             Balance Exercises - 07/28/16 1151      Balance Exercises: Standing   Standing Eyes Opened Narrow base of support (BOS);3 reps;30 secs   Sidestepping 2 reps  in // bars   Step Over Hurdles / Cones 6in 2RT alternating  transition to big step taking   Cone Rotation Solid surface;R/L   Cone Rotation Limitations 5 cone rotation NBOS             PT Short Term Goals - 07/05/16 1101      PT SHORT TERM GOAL #1   Title Patient to be able to complete all bed mobility with Min assist in order to demonstrate improved mobility and need for less assistance from family at  home    Baseline 8/8- Mod-Max(A) for rolling, Min-Mod(A) for supine<-->sit    Time 4   Period Weeks   Status On-going     PT SHORT TERM GOAL #2   Title Patient to be able to complete all stand to sit/sit to stand transfers from various height surfaces with Min assist in order to demonstrate improved mobility and need for less assistance from family at home    Baseline 8/8- getting better but has not tried low surfaces yet    Time 4   Period Weeks   Status On-going     PT SHORT TERM GOAL #3   Title Patient to demonstrate consistent improvements in gait pattern with LRAD, including minimal festination, increased step lengths/stance times, improved posture, and minimal instabilty in order to improve moblity at home and in community    Time 4   Period Weeks   Status On-going     PT SHORT TERM GOAL #4   Title Patient to demonstrate improved posture at least 70% of the time  in order to improve balance and overall mechanics of mobility    Time 4   Period Weeks   Status On-going     PT SHORT TERM GOAL #5   Title Patient to experience R ankle pain no more than 2/10 in order to improve QOL and increase tolerance of weight bearing activities    Baseline 8/8- 5/10 today, 7/10 on average    Time 4   Period Weeks   Status On-going     PT SHORT TERM GOAL #6   Title Patient to be independent in correctly and consistently performing appropriate HEP, to be updated PRN    Baseline 8/8- reports compliance but unable to recite or demonstrate HEP, states she had more from PT when she was only given 2    Period Weeks   Status On-going           PT Long Term Goals - 07/05/16 1104      PT LONG TERM GOAL #1   Title Patient to be able to complete all bed mobility and sit to stand/stand to sit transfers with LRAD and Mod(I) in order to demonstrate improved mobility and overall QOL    Time 8   Period Weeks   Status On-going     PT LONG TERM GOAL #2   Title Patient to be able to ambulate for  at least 20 minutes with mechanics WFL, LRAD, pain no more than 1/10 in order to demonstrate improved mobilty and overall QOL  Time 8   Period Weeks   Status On-going     PT LONG TERM GOAL #3   Title Patient to demonstrate strength 4/5 in all tested muscle groups in order to assist in improving mobiltiy, reducing pain, and improving balance    Time 8   Period Weeks   Status On-going     PT LONG TERM GOAL #4   Title Patient to demonstrate full R ankle ROM and experience no more than occasional R ankle pain 1/10 in order to improve tolerance to mobility and assist in improving QOL    Time 8   Period Weeks   Status On-going     PT LONG TERM GOAL #5   Title Patient to be participatory in regular exercise at least 20 minutes in duration, of moderate intensity, at least 4 days per week, in order to maintain functional gains and assist in managing parkinsonian symptoms    Time 8   Period Weeks   Status On-going     PT LONG TERM GOAL #6   Title Patient to report no falls within the past 4 weeks in order to demonstrate improved safety at home and within community    Baseline 8/8- falls reported yesterday for both patient and her husband    Time 8   Period Weeks   Status On-going               Plan - 07/28/16 1138    Clinical Impression Statement Session focus on ankle strengthening exercises while incorporating gait training to improve gait mechanics and balance training with big movements.  Pt continues to present with shuffled gait primarly with difficulty advancing Lt LE with min guard during gait training for safety.  Pt presentes wtih good safety awareness evidence by ability to stop in place with increased shuffling mechanics for safety, does continue to require cueing to increase stride length and posture.  Min A with balance activities to reduce risk of falls.  Ended session with soft tissue mobilization and passive stretches to address ankle mobility with reports of pain  reduced.  Pt was limited by fatigue at EOS with increased cueing to increase stride length with gait.     Rehab Potential Good   PT Frequency 2x / week   PT Duration 4 weeks   PT Treatment/Interventions ADLs/Self Care Home Management;Biofeedback;Gait training;Stair training;Functional mobility training;Therapeutic activities;Therapeutic exercise;Balance training;Neuromuscular re-education;Patient/family education;Manual techniques;Passive range of motion;Energy conservation;Taping   PT Next Visit Plan Increase focus on ankle exercises and PWR! drills.  Decrease focus on manual unless needed for pain reduction/control.       Patient will benefit from skilled therapeutic intervention in order to improve the following deficits and impairments:  Abnormal gait, Decreased endurance, Hypomobility, Impaired tone, Decreased activity tolerance, Decreased strength, Pain, Decreased balance, Decreased mobility, Difficulty walking, Decreased range of motion, Improper body mechanics, Decreased coordination, Decreased safety awareness, Impaired flexibility, Postural dysfunction, Decreased knowledge of use of DME  Visit Diagnosis: Unsteadiness on feet  Difficulty in walking, not elsewhere classified  History of falling  Muscle weakness (generalized)  Abnormal posture  Pain in right ankle and joints of right foot  Stiffness of right ankle, not elsewhere classified     Problem List Patient Active Problem List   Diagnosis Date Noted  . Achilles tendinitis of right lower extremity 04/05/2016  . Hypertensive retinopathy of both eyes, grade 2 01/15/2016  . Left leg DVT (HCC) 12/18/2015  . 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 Diverticulosis 03/10/2015  .  Internal hemorrhoids 03/10/2015  . History of colon polyps 12/25/2014  . Health care maintenance 02/13/2014  . Subclinical hyperthyroidism 12/25/2013  . Right renal mass 06/11/2012  . NSAID-associated gastropathy  06/06/2012  . Osteoarthritis of both knees 03/15/2012  . Parkinsonism (HCC) 07/13/2010  . Diabetes mellitus type 2, diet-controlled (HCC) 05/22/2009  . ALLERGIC RHINITIS, SEASONAL 02/20/2007  . Hyperlipidemia 09/27/2006  . Essential hypertension 09/27/2006  . GERD 09/27/2006   Becky Sax, LPTA; CBIS [phone]  Juel Burrow 07/28/2016, 11:58 AM  [facility] [address] [place], [place], [address] Phone: [phone]   Fax:  [phone]  Name: TABITA CORBO MRN: [id number] Date of Birth: 02/16/1941

## 2016-08-02 ENCOUNTER — Ambulatory Visit (HOSPITAL_COMMUNITY): Payer: PPO | Attending: Internal Medicine | Admitting: Physical Therapy

## 2016-08-02 DIAGNOSIS — Z9181 History of falling: Secondary | ICD-10-CM | POA: Diagnosis not present

## 2016-08-02 DIAGNOSIS — R262 Difficulty in walking, not elsewhere classified: Secondary | ICD-10-CM

## 2016-08-02 DIAGNOSIS — R293 Abnormal posture: Secondary | ICD-10-CM

## 2016-08-02 DIAGNOSIS — R2681 Unsteadiness on feet: Secondary | ICD-10-CM

## 2016-08-02 DIAGNOSIS — M25671 Stiffness of right ankle, not elsewhere classified: Secondary | ICD-10-CM | POA: Diagnosis not present

## 2016-08-02 DIAGNOSIS — M6281 Muscle weakness (generalized): Secondary | ICD-10-CM | POA: Diagnosis not present

## 2016-08-02 DIAGNOSIS — M25571 Pain in right ankle and joints of right foot: Secondary | ICD-10-CM

## 2016-08-02 NOTE — Therapy (Signed)
[place], [place], [address] Phone: [phone]   Fax:  [phone]  Physical Therapy Treatment (Re-Assessment)  Patient Details  Name: Nicole Baxter MRN: [id number] Date of Birth: 07-30-1941 Referring Provider: Duwaine Maxin   Encounter Date: 08/02/2016      PT End of Session - 08/02/16 1212    Visit Number 13   Number of Visits 21   Date for PT Re-Evaluation 08/30/16   Authorization Type [employer] (G-codes done January 26, 2023 visit)   Authorization Time Period 2/33/00 to 7/62/26; recert done 01/28/34   Authorization - Visit Number 13   Authorization - Number of Visits 23   PT Start Time 0944   PT Stop Time 1028   PT Time Calculation (min) 44 min   Equipment Utilized During Treatment Gait belt   Activity Tolerance Patient tolerated treatment well;No increased pain   Behavior During Therapy WFL for tasks assessed/performed      Past Medical History:  Diagnosis Date  . Arthritis   . GERD (gastroesophageal reflux disease)    pt. states she no longer has GERD (09/11/15)  . Hyperglycemia    isolated FBS 122, 95, 104 (Random 138)  . Hyperlipidemia    on pravastatin  . Hypertension    controlled on 2 agents  . 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 Obesity   . Parkinson's disease (tremor, stiffness, slow motion, unstable posture) ([place])   . Shortness of breath dyspnea    pt. states that she no longer has SOB )09/11/15  . Tremor     Past Surgical History:  Procedure Laterality Date  . CESAREAN SECTION    . COLONOSCOPY    . TOTAL KNEE ARTHROPLASTY Left 09/23/2015   Procedure: LEFT TOTAL KNEE ARTHROPLASTY;  Surgeon: Ninetta Lights, MD;  Location: [facility];  Service: Orthopedics;  Laterality: Left;    There were no vitals filed for this visit.      Subjective Assessment - 08/02/16 0949    Subjective Patient reports she is doing well today, her ankle has not been bothering her as badly but  getting around/walking is just going fair. Her husband states she is doing better in general however. No falls or close calls recently, the hardest thing for her to do right now is laying on her back due to her breathing problems, husband also states bed mobility is hard.    Patient is accompained by: Family member   Pertinent History blood thinner (now currently taking per patient- will check with MD), HTN, GERD, DM, Parkinson, OA, SOB, hx of acute blood clot in January 2017; history of macular degeneration   Patient Stated Goals walking better, better mobility    Currently in Pain? Yes   Pain Score 4    Pain Location Back   Pain Orientation Mid   Pain Descriptors / Indicators Nagging   Pain Type Acute pain   Pain Radiating Towards none    Pain Onset Yesterday   Pain Frequency Intermittent   Aggravating Factors  nothing    Pain Relieving Factors nothing    Effect of Pain on Daily Activities none             [facility] PT Assessment - 08/02/16 0001      Assessment   Medical Diagnosis parkinsons and achilles tendonitis    Referring Provider Duwaine Maxin    Onset Date/Surgical Date --  chronic    Next MD Visit MRI on  Friday      Precautions   Precaution Comments Eliquis, fall risk      Balance Screen   Has the patient fallen in the past 6 months Yes   How many times? 2   Has the patient had a decrease in activity level because of a fear of falling?  Yes   Is the patient reluctant to leave their home because of a fear of falling?  Yes     Prior Function   Level of Independence Needs assistance with transfers;Needs assistance with homemaking;Needs assistance with ADLs;Needs assistance with gait   Vocation Retired     AROM   Right Ankle Dorsiflexion 8   Left Ankle Dorsiflexion 8     Strength   Right Hip Flexion 3+/5   Right Hip ABduction 2/5   Left Hip Flexion 3-/5   Left Hip ABduction 2/5   Right Knee Extension 4+/5   Left Knee Extension 4+/5   Right Ankle Dorsiflexion  4+/5   Left Ankle Dorsiflexion 4+/5     Bed Mobility   Rolling Right 4: Min assist   Rolling Left 4: Min assist;4: Min guard   Supine to Sit 4: Min assist   Sit to Supine 4: Min assist     Transfers   Sit to Stand 5: Supervision   Stand to Sit 5: Supervision     6 minute walk test results    Aerobic Endurance Distance Walked 169   Endurance additional comments 3MWT with rollator      High Level Balance   High Level Balance Comments SLS 8 seconds L, 2 seconds R                              PT Education - 08/02/16 1211    Education provided Yes   Education Details progress with skilled PT services, POC moving forward    Person(s) Educated Patient;Spouse   Methods Explanation   Comprehension Verbalized understanding          PT Short Term Goals - 08/02/16 1021      PT SHORT TERM GOAL #1   Title Patient to be able to complete all bed mobility with Min assist in order to demonstrate improved mobility and need for less assistance from family at home    Baseline 9/5- Min(A)   Time 4   Period Weeks   Status Achieved     PT SHORT TERM GOAL #2   Title Patient to be able to complete all stand to sit/sit to stand transfers from various height surfaces with Min assist in order to demonstrate improved mobility and need for less assistance from family at home    Baseline 9/5- can take a couple tries but she is able with min guard/S    Time 4   Period Weeks   Status Achieved     PT SHORT TERM GOAL #3   Title Patient to demonstrate consistent improvements in gait pattern with LRAD, including minimal festination, increased step lengths/stance times, improved posture, and minimal instabilty in order to improve moblity at home and in community    Baseline 9/5- improving but deteriorates with fatigue    Time 4   Period Weeks   Status On-going     PT SHORT TERM GOAL #4   Title Patient to demonstrate improved posture at least 70% of the time  in order to improve  balance and overall mechanics of mobility  Time 4   Period Weeks   Status On-going     PT SHORT TERM GOAL #5   Title Patient to experience R ankle pain no more than 2/10 in order to improve QOL and increase tolerance of weight bearing activities    Baseline 9/5- usually not too high but can get to 7/10 with extended walking    Time 4   Period Weeks   Status On-going     PT SHORT TERM GOAL #6   Title Patient to be independent in correctly and consistently performing appropriate HEP, to be updated PRN    Time 4   Period Weeks   Status On-going           PT Long Term Goals - 08/02/16 1024      PT LONG TERM GOAL #1   Title Patient to be able to complete all bed mobility and sit to stand/stand to sit transfers with LRAD and Mod(I) in order to demonstrate improved mobility and overall QOL    Time 8   Period Weeks   Status On-going     PT LONG TERM GOAL #2   Title Patient to be able to ambulate for at least 20 minutes with mechanics WFL, LRAD, pain no more than 1/10 in order to demonstrate improved mobilty and overall QOL    Time 8   Period Weeks   Status On-going     PT LONG TERM GOAL #3   Title Patient to demonstrate strength 4/5 in all tested muscle groups in order to assist in improving mobiltiy, reducing pain, and improving balance    Time 8   Period Weeks   Status Partially Met     PT LONG TERM GOAL #4   Title Patient to demonstrate full R ankle ROM and experience no more than occasional R ankle pain 1/10 in order to improve tolerance to mobility and assist in improving QOL    Time 8   Period Weeks   Status On-going     PT LONG TERM GOAL #5   Title Patient to be participatory in regular exercise at least 20 minutes in duration, of moderate intensity, at least 4 days per week, in order to maintain functional gains and assist in managing parkinsonian symptoms    Time 8   Period Weeks   Status On-going     PT LONG TERM GOAL #6   Title Patient to report no falls  within the past 4 weeks in order to demonstrate improved safety at home and within community    Baseline 9/5- reports this is going well    Time 8   Period Weeks   Status On-going               Plan - 08/02/16 1213    Clinical Impression Statement Re-assessment performed today. Patient is making excellent progress with skilled PT services, with objective and subjective improvements on all fronts per patient and her husband; however, patient states she is still not getting around as well as she would like and her husband states that she does continue to have difficulty with bed mobility in particular. Upon examination, patient reveals improvements in functional strength, B ankle ROM, gait mechanics, and balance; however note that despite improvements patient does continue to show significant impairments in all mobility and does remain a considerable fall risk at this time. She will benefit from extended skilled PT services in order to address functional limitations, assist in reaching optimal level of function, and  to reduce overall fall risk.    Rehab Potential Good   PT Frequency 2x / week   PT Duration 4 weeks   PT Treatment/Interventions ADLs/Self Care Home Management;Biofeedback;Gait training;Stair training;Functional mobility training;Therapeutic activities;Therapeutic exercise;Balance training;Neuromuscular re-education;Patient/family education;Manual techniques;Passive range of motion;Energy conservation;Taping   PT Next Visit Plan Increase focus on ankle exercises and PWR! drills.  Decrease focus on manual unless needed for pain reduction/control.    Consulted and Agree with Plan of Care Patient   Family Member Consulted spouse       Patient will benefit from skilled therapeutic intervention in order to improve the following deficits and impairments:  Abnormal gait, Decreased endurance, Hypomobility, Impaired tone, Decreased activity tolerance, Decreased strength, Pain, Decreased  balance, Decreased mobility, Difficulty walking, Decreased range of motion, Improper body mechanics, Decreased coordination, Decreased safety awareness, Impaired flexibility, Postural dysfunction, Decreased knowledge of use of DME  Visit Diagnosis: Unsteadiness on feet  Difficulty in walking, not elsewhere classified  History of falling  Muscle weakness (generalized)  Abnormal posture  Pain in right ankle and joints of right foot  Stiffness of right ankle, not elsewhere classified       G-Codes - 08/05/16 1216    Functional Assessment Tool Used Based on skilled clinical assessment of gait, functional mobility, functional balance,strength, safety awareness, ROM    Functional Limitation Mobility: Walking and moving around   Mobility: Walking and Moving Around Current Status [phone]) At least 40 percent but less than 60 percent impaired, limited or restricted   Mobility: Walking and Moving Around Goal Status [phone]) At least 20 percent but less than 40 percent impaired, limited or restricted      Problem List Patient Active Problem List   Diagnosis Date Noted  . Achilles tendinitis of right lower extremity 04/05/2016  . Hypertensive retinopathy of both eyes, grade 2 01/15/2016  . Left leg DVT ([place]) 12/18/2015  . 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 Diverticulosis 03/10/2015  . Internal hemorrhoids 03/10/2015  . History of colon polyps 12/25/2014  . Health care maintenance 02/13/2014  . Subclinical hyperthyroidism 12/25/2013  . Right renal mass 06/11/2012  . NSAID-associated gastropathy 06/06/2012  . Osteoarthritis of both knees 03/15/2012  . Parkinsonism ([place]) 07/13/2010  . Diabetes mellitus type 2, diet-controlled ([place]) 05/22/2009  . ALLERGIC RHINITIS, SEASONAL 02/20/2007  . Hyperlipidemia 09/27/2006  . Essential hypertension 09/27/2006  . GERD 09/27/2006    Deniece Ree PT, DPT [place] [address] [place], [place], [address] Phone: [phone]   Fax:  [phone]  Name: Nicole Baxter MRN: [id number] Date of Birth: 1941/02/17

## 2016-08-04 ENCOUNTER — Ambulatory Visit (HOSPITAL_COMMUNITY): Payer: PPO | Admitting: Physical Therapy

## 2016-08-04 DIAGNOSIS — M6281 Muscle weakness (generalized): Secondary | ICD-10-CM

## 2016-08-04 DIAGNOSIS — M25671 Stiffness of right ankle, not elsewhere classified: Secondary | ICD-10-CM

## 2016-08-04 DIAGNOSIS — R2681 Unsteadiness on feet: Secondary | ICD-10-CM | POA: Diagnosis not present

## 2016-08-04 DIAGNOSIS — M25571 Pain in right ankle and joints of right foot: Secondary | ICD-10-CM

## 2016-08-04 DIAGNOSIS — R262 Difficulty in walking, not elsewhere classified: Secondary | ICD-10-CM

## 2016-08-04 DIAGNOSIS — Z9181 History of falling: Secondary | ICD-10-CM

## 2016-08-04 DIAGNOSIS — R293 Abnormal posture: Secondary | ICD-10-CM

## 2016-08-04 NOTE — Therapy (Signed)
[place], [place], [address] Phone: [phone]   Fax:  [phone]  Physical Therapy Treatment  Patient Details  Name: Nicole Baxter MRN: [id number] Date of Birth: 03/30/1941 Referring Provider: Duwaine Maxin   Encounter Date: 08/04/2016      PT End of Session - 08/04/16 1211    Visit Number 14   Number of Visits 21   Date for PT Re-Evaluation 08/30/16   Authorization Type [employer] (G-codes done 12-15-22 visit)   Authorization Time Period 9/52/84 to 1/32/44; recert done 0/1/02; recert done 05/29/52   Authorization - Visit Number 14   Authorization - Number of Visits 23   PT Start Time 1117   PT Stop Time 1205   PT Time Calculation (min) 48 min   Equipment Utilized During Treatment Gait belt   Activity Tolerance Patient tolerated treatment well;No increased pain   Behavior During Therapy WFL for tasks assessed/performed      Past Medical History:  Diagnosis Date  . Arthritis   . GERD (gastroesophageal reflux disease)    pt. states she no longer has GERD (09/11/15)  . Hyperglycemia    isolated FBS 122, 95, 104 (Random 138)  . Hyperlipidemia    on pravastatin  . Hypertension    controlled on 2 agents  . 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 Obesity   . Parkinson's disease (tremor, stiffness, slow motion, unstable posture) ([place])   . Shortness of breath dyspnea    pt. states that she no longer has SOB )09/11/15  . Tremor     Past Surgical History:  Procedure Laterality Date  . CESAREAN SECTION    . COLONOSCOPY    . TOTAL KNEE ARTHROPLASTY Left 09/23/2015   Procedure: LEFT TOTAL KNEE ARTHROPLASTY;  Surgeon: Ninetta Lights, MD;  Location: [facility];  Service: Orthopedics;  Laterality: Left;    There were no vitals filed for this visit.      Subjective Assessment - 08/04/16 1123    Subjective (P)  Patient reports she is doing well today, no major changes overall; she is feeling  good and OK overall today. She is going to see her MD to check her kidneys soon.    Pertinent History (P)  blood thinner (now currently taking per patient- will check with MD), HTN, GERD, DM, Parkinson, OA, SOB, hx of acute blood clot in January 2017; history of macular degeneration   Patient Stated Goals (P)  walking better, better mobility    Currently in Pain? (P)  No/denies                         [facility] Adult PT Treatment/Exercise - 08/04/16 0001      Ambulation/Gait   Gait Comments cues for step length/heel-toe throughout session      Therapeutic Activites    Other Therapeutic Activities cross midline reaching/rotation with boom whackers on solid surface and on foam, min guard to min(A) for safety/balance      Manual Therapy   Manual Therapy Joint mobilization   Manual therapy comments separate from all other skilled services   Joint Mobilization grade III mobilzation of R foot supination/pronation, talar DF, calcaneal inversoin/eversion      Ankle Exercises: Stretches   Gastroc Stretch 3 reps;30 seconds;Other (comment)  R LE            PWR [facility]) - 08/04/16 1208    PWR! Up  1x10 seated with ball/cone    PWR! Rock 1x10, seated with targets    PWR! Twist 1x10, seated with targets   PWR! Step 1x6, seated with cones and hurdles              PT Education - 08/04/16 1210    Education provided Yes   Education Details encouraged trip to podiatrist for foot care    Person(s) Educated Patient;Spouse   Methods Explanation   Comprehension Verbalized understanding          PT Short Term Goals - 08/02/16 1021      PT SHORT TERM GOAL #1   Title Patient to be able to complete all bed mobility with Min assist in order to demonstrate improved mobility and need for less assistance from family at home    Baseline 9/5- Min(A)   Time 4   Period Weeks   Status Achieved     PT SHORT TERM GOAL #2   Title Patient to be able to complete all stand to sit/sit to  stand transfers from various height surfaces with Min assist in order to demonstrate improved mobility and need for less assistance from family at home    Baseline 9/5- can take a couple tries but she is able with min guard/S    Time 4   Period Weeks   Status Achieved     PT SHORT TERM GOAL #3   Title Patient to demonstrate consistent improvements in gait pattern with LRAD, including minimal festination, increased step lengths/stance times, improved posture, and minimal instabilty in order to improve moblity at home and in community    Baseline 9/5- improving but deteriorates with fatigue    Time 4   Period Weeks   Status On-going     PT SHORT TERM GOAL #4   Title Patient to demonstrate improved posture at least 70% of the time  in order to improve balance and overall mechanics of mobility    Time 4   Period Weeks   Status On-going     PT SHORT TERM GOAL #5   Title Patient to experience R ankle pain no more than 2/10 in order to improve QOL and increase tolerance of weight bearing activities    Baseline 9/5- usually not too high but can get to 7/10 with extended walking    Time 4   Period Weeks   Status On-going     PT SHORT TERM GOAL #6   Title Patient to be independent in correctly and consistently performing appropriate HEP, to be updated PRN    Time 4   Period Weeks   Status On-going           PT Long Term Goals - 08/02/16 1024      PT LONG TERM GOAL #1   Title Patient to be able to complete all bed mobility and sit to stand/stand to sit transfers with LRAD and Mod(I) in order to demonstrate improved mobility and overall QOL    Time 8   Period Weeks   Status On-going     PT LONG TERM GOAL #2   Title Patient to be able to ambulate for at least 20 minutes with mechanics WFL, LRAD, pain no more than 1/10 in order to demonstrate improved mobilty and overall QOL    Time 8   Period Weeks   Status On-going     PT LONG TERM GOAL #3   Title Patient to demonstrate  strength 4/5 in all tested muscle  groups in order to assist in improving mobiltiy, reducing pain, and improving balance    Time 8   Period Weeks   Status Partially Met     PT LONG TERM GOAL #4   Title Patient to demonstrate full R ankle ROM and experience no more than occasional R ankle pain 1/10 in order to improve tolerance to mobility and assist in improving QOL    Time 8   Period Weeks   Status On-going     PT LONG TERM GOAL #5   Title Patient to be participatory in regular exercise at least 20 minutes in duration, of moderate intensity, at least 4 days per week, in order to maintain functional gains and assist in managing parkinsonian symptoms    Time 8   Period Weeks   Status On-going     PT LONG TERM GOAL #6   Title Patient to report no falls within the past 4 weeks in order to demonstrate improved safety at home and within community    Baseline 9/5- reports this is going well    Time 8   Period Weeks   Status On-going               Plan - 08/04/16 1211    Clinical Impression Statement Began session with seated PWR! Exercises with cues for form and posture throughout, also performed standing coordination/balance exercise in standing with boom whackers on solid surface and on foam, min guard to Min(A) provided for safety. Patient does continue to demonstrate some difficulty with balance and stepping strategy as evidenced by difficulty in clearing edge of foam to step up on top of it and requiring Min(A) for this task for safety. Finished session with manual to R ankle/foot in order to continue to manage pain and prevent exacerbation of Achilles tendonitis. Cues for posture and step length provided during gait in clinic throughout session. No increase in pain by end of session.    Rehab Potential Good   PT Frequency 2x / week   PT Duration 4 weeks   PT Treatment/Interventions ADLs/Self Care Home Management;Biofeedback;Gait training;Stair training;Functional mobility  training;Therapeutic activities;Therapeutic exercise;Balance training;Neuromuscular re-education;Patient/family education;Manual techniques;Passive range of motion;Energy conservation;Taping   PT Next Visit Plan Increase focus on ankle exercises and PWR! drills.  Decrease focus on manual unless needed for pain reduction/control.    Consulted and Agree with Plan of Care Patient   Family Member Consulted spouse       Patient will benefit from skilled therapeutic intervention in order to improve the following deficits and impairments:  Abnormal gait, Decreased endurance, Hypomobility, Impaired tone, Decreased activity tolerance, Decreased strength, Pain, Decreased balance, Decreased mobility, Difficulty walking, Decreased range of motion, Improper body mechanics, Decreased coordination, Decreased safety awareness, Impaired flexibility, Postural dysfunction, Decreased knowledge of use of DME  Visit Diagnosis: Unsteadiness on feet  Difficulty in walking, not elsewhere classified  History of falling  Muscle weakness (generalized)  Abnormal posture  Pain in right ankle and joints of right foot  Stiffness of right ankle, not elsewhere classified     Problem List Patient Active Problem List   Diagnosis Date Noted  . Achilles tendinitis of right lower extremity 04/05/2016  . Hypertensive retinopathy of both eyes, grade 2 01/15/2016  . Left leg DVT ([place]) 12/18/2015  . 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 Diverticulosis 03/10/2015  . Internal hemorrhoids 03/10/2015  . History of colon polyps 12/25/2014  . Health care maintenance 02/13/2014  . Subclinical hyperthyroidism 12/25/2013  .  Right renal mass 06/11/2012  . NSAID-associated gastropathy 06/06/2012  . Osteoarthritis of both knees 03/15/2012  . Parkinsonism ([place]) 07/13/2010  . Diabetes mellitus type 2, diet-controlled ([place]) 05/22/2009  . ALLERGIC RHINITIS, SEASONAL 02/20/2007  . Hyperlipidemia  09/27/2006  . Essential hypertension 09/27/2006  . GERD 09/27/2006    Deniece Ree PT, DPT [facility] [address] [place], [place], [address] Phone: [phone]   Fax:  [phone]  Name: MARCY SOOKDEO MRN: [id number] Date of Birth: Oct 23, 1941

## 2016-08-05 ENCOUNTER — Ambulatory Visit (HOSPITAL_COMMUNITY)
Admission: RE | Admit: 2016-08-05 | Discharge: 2016-08-05 | Disposition: A | Discharge status: Home / Self Care | Payer: PPO | Source: Ambulatory Visit | Attending: Internal Medicine | Admitting: Internal Medicine

## 2016-08-05 DIAGNOSIS — N2889 Other specified disorders of kidney and ureter: Secondary | ICD-10-CM | POA: Diagnosis not present

## 2016-08-09 ENCOUNTER — Ambulatory Visit (HOSPITAL_COMMUNITY): Payer: PPO | Admitting: Physical Therapy

## 2016-08-09 DIAGNOSIS — R293 Abnormal posture: Secondary | ICD-10-CM

## 2016-08-09 DIAGNOSIS — R2681 Unsteadiness on feet: Secondary | ICD-10-CM | POA: Diagnosis not present

## 2016-08-09 DIAGNOSIS — R262 Difficulty in walking, not elsewhere classified: Secondary | ICD-10-CM

## 2016-08-09 DIAGNOSIS — Z9181 History of falling: Secondary | ICD-10-CM

## 2016-08-09 DIAGNOSIS — M25671 Stiffness of right ankle, not elsewhere classified: Secondary | ICD-10-CM

## 2016-08-09 DIAGNOSIS — M6281 Muscle weakness (generalized): Secondary | ICD-10-CM

## 2016-08-09 DIAGNOSIS — M25571 Pain in right ankle and joints of right foot: Secondary | ICD-10-CM

## 2016-08-09 NOTE — Therapy (Signed)
[place], [place], [address] Phone: [phone]   Fax:  [phone]  Physical Therapy Treatment  Patient Details  Name: Nicole Baxter MRN: [id number] Date of Birth: 03-06-1941 Referring Provider: Duwaine Maxin   Encounter Date: 08/09/2016      PT End of Session - 08/09/16 1209    Visit Number 15   Number of Visits 21   Date for PT Re-Evaluation 08/30/16   Authorization Type [employer] (G-codes done 12-19-2022 visit)   Authorization Time Period 03/05/13 to 4/81/85; recert done 04/30/13; recert done 08/04/01   Authorization - Visit Number 15   Authorization - Number of Visits 23   PT Start Time 0947   PT Stop Time 1030   PT Time Calculation (min) 43 min   Equipment Utilized During Treatment Gait belt   Activity Tolerance Patient tolerated treatment well;No increased pain   Behavior During Therapy WFL for tasks assessed/performed      Past Medical History:  Diagnosis Date  . Arthritis   . GERD (gastroesophageal reflux disease)    pt. states she no longer has GERD (09/11/15)  . Hyperglycemia    isolated FBS 122, 95, 104 (Random 138)  . Hyperlipidemia    on pravastatin  . Hypertension    controlled on 2 agents  . 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 Obesity   . Parkinson's disease (tremor, stiffness, slow motion, unstable posture) ([place])   . Shortness of breath dyspnea    pt. states that she no longer has SOB )09/11/15  . Tremor     Past Surgical History:  Procedure Laterality Date  . CESAREAN SECTION    . COLONOSCOPY    . TOTAL KNEE ARTHROPLASTY Left 09/23/2015   Procedure: LEFT TOTAL KNEE ARTHROPLASTY;  Surgeon: Ninetta Lights, MD;  Location: [facility];  Service: Orthopedics;  Laterality: Left;    There were no vitals filed for this visit.      Subjective Assessment - 08/09/16 0953    Subjective Patient reports she is doing well, no major changes since last time and she is not  having any pain today   Pertinent History blood thinner (now currently taking per patient- will check with MD), HTN, GERD, DM, Parkinson, OA, SOB, hx of acute blood clot in January 2017; history of macular degeneration   Patient Stated Goals walking better, better mobility    Currently in Pain? No/denies                         [facility] Adult PT Treatment/Exercise - 08/09/16 0001      Ambulation/Gait   Gait Comments cues for step lengths and posture throughout session      Therapeutic Activites    Other Therapeutic Activities ball toss/catching while standing on foam pad with progressively decreasing BOS, min guard            PWR [facility]) - 08/09/16 1207    PWR! Twist 2x5   PWR Step 2x5             PT Education - 08/09/16 1209    Education provided Yes   Education Details encouraged patient to see MD regarding fall/L UE pain    Person(s) Educated Patient   Methods Explanation   Comprehension Verbalized understanding          PT Short Term Goals - 08/02/16 1021      PT SHORT TERM  GOAL #1   Title Patient to be able to complete all bed mobility with Min assist in order to demonstrate improved mobility and need for less assistance from family at home    Baseline 9/5- Min(A)   Time 4   Period Weeks   Status Achieved     PT SHORT TERM GOAL #2   Title Patient to be able to complete all stand to sit/sit to stand transfers from various height surfaces with Min assist in order to demonstrate improved mobility and need for less assistance from family at home    Baseline 9/5- can take a couple tries but she is able with min guard/S    Time 4   Period Weeks   Status Achieved     PT SHORT TERM GOAL #3   Title Patient to demonstrate consistent improvements in gait pattern with LRAD, including minimal festination, increased step lengths/stance times, improved posture, and minimal instabilty in order to improve moblity at home and in community    Baseline 9/5-  improving but deteriorates with fatigue    Time 4   Period Weeks   Status On-going     PT SHORT TERM GOAL #4   Title Patient to demonstrate improved posture at least 70% of the time  in order to improve balance and overall mechanics of mobility    Time 4   Period Weeks   Status On-going     PT SHORT TERM GOAL #5   Title Patient to experience R ankle pain no more than 2/10 in order to improve QOL and increase tolerance of weight bearing activities    Baseline 9/5- usually not too high but can get to 7/10 with extended walking    Time 4   Period Weeks   Status On-going     PT SHORT TERM GOAL #6   Title Patient to be independent in correctly and consistently performing appropriate HEP, to be updated PRN    Time 4   Period Weeks   Status On-going           PT Long Term Goals - 08/02/16 1024      PT LONG TERM GOAL #1   Title Patient to be able to complete all bed mobility and sit to stand/stand to sit transfers with LRAD and Mod(I) in order to demonstrate improved mobility and overall QOL    Time 8   Period Weeks   Status On-going     PT LONG TERM GOAL #2   Title Patient to be able to ambulate for at least 20 minutes with mechanics WFL, LRAD, pain no more than 1/10 in order to demonstrate improved mobilty and overall QOL    Time 8   Period Weeks   Status On-going     PT LONG TERM GOAL #3   Title Patient to demonstrate strength 4/5 in all tested muscle groups in order to assist in improving mobiltiy, reducing pain, and improving balance    Time 8   Period Weeks   Status Partially Met     PT LONG TERM GOAL #4   Title Patient to demonstrate full R ankle ROM and experience no more than occasional R ankle pain 1/10 in order to improve tolerance to mobility and assist in improving QOL    Time 8   Period Weeks   Status On-going     PT LONG TERM GOAL #5   Title Patient to be participatory in regular exercise at least 20 minutes in duration, of   moderate intensity, at least 4  days per week, in order to maintain functional gains and assist in managing parkinsonian symptoms    Time 8   Period Weeks   Status On-going     PT LONG TERM GOAL #6   Title Patient to report no falls within the past 4 weeks in order to demonstrate improved safety at home and within community    Baseline 9/5- reports this is going well    Time 8   Period Weeks   Status On-going               Plan - 08/09/16 1215    Clinical Impression Statement Introduced increased focus on standing PWR activities, with primary activities being PWR twist and PWR stepping in parallel bars today with min guard and verbal cue for form; also performed coordination activity involving ball toss/reaction on foam pad with min guard and progressively reduced BOS. Patient did reveal that she had a fall this weekend while she was trying to walk without her rollator, she has not been to MD yet as her arm has not swollen up; encouraged patient to see MD regarding her arm/fall.    Rehab Potential Good   PT Frequency 2x / week   PT Duration 4 weeks   PT Treatment/Interventions ADLs/Self Care Home Management;Biofeedback;Gait training;Stair training;Functional mobility training;Therapeutic activities;Therapeutic exercise;Balance training;Neuromuscular re-education;Patient/family education;Manual techniques;Passive range of motion;Energy conservation;Taping   PT Next Visit Plan Increase focus on ankle exercises and PWR! drills.  Decrease focus on manual unless needed for pain reduction/control.    PT Home Exercise Plan updated heel toe gait    Consulted and Agree with Plan of Care Patient      Patient will benefit from skilled therapeutic intervention in order to improve the following deficits and impairments:  Abnormal gait, Decreased endurance, Hypomobility, Impaired tone, Decreased activity tolerance, Decreased strength, Pain, Decreased balance, Decreased mobility, Difficulty walking, Decreased range of motion,  Improper body mechanics, Decreased coordination, Decreased safety awareness, Impaired flexibility, Postural dysfunction, Decreased knowledge of use of DME  Visit Diagnosis: Unsteadiness on feet  Difficulty in walking, not elsewhere classified  History of falling  Muscle weakness (generalized)  Abnormal posture  Pain in right ankle and joints of right foot  Stiffness of right ankle, not elsewhere classified     Problem List Patient Active Problem List   Diagnosis Date Noted  . Achilles tendinitis of right lower extremity 04/05/2016  . Hypertensive retinopathy of both eyes, grade 2 01/15/2016  . Left leg DVT ([place]) 12/18/2015  . 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 Diverticulosis 03/10/2015  . Internal hemorrhoids 03/10/2015  . History of colon polyps 12/25/2014  . Health care maintenance 02/13/2014  . Subclinical hyperthyroidism 12/25/2013  . Right renal mass 06/11/2012  . NSAID-associated gastropathy 06/06/2012  . Osteoarthritis of both knees 03/15/2012  . Parkinsonism ([place]) 07/13/2010  . Diabetes mellitus type 2, diet-controlled ([place]) 05/22/2009  . ALLERGIC RHINITIS, SEASONAL 02/20/2007  . Hyperlipidemia 09/27/2006  . Essential hypertension 09/27/2006  . GERD 09/27/2006    Deniece Ree PT, DPT [facility] [address] [place], [place], [address] Phone: [phone]   Fax:  [phone]  Name: Nicole Baxter MRN: [id number] Date of Birth: 09-Aug-1941

## 2016-08-11 ENCOUNTER — Ambulatory Visit (HOSPITAL_COMMUNITY): Payer: PPO | Admitting: Physical Therapy

## 2016-08-11 DIAGNOSIS — R2681 Unsteadiness on feet: Secondary | ICD-10-CM

## 2016-08-11 DIAGNOSIS — Z9181 History of falling: Secondary | ICD-10-CM

## 2016-08-11 DIAGNOSIS — R262 Difficulty in walking, not elsewhere classified: Secondary | ICD-10-CM

## 2016-08-11 DIAGNOSIS — M25671 Stiffness of right ankle, not elsewhere classified: Secondary | ICD-10-CM

## 2016-08-11 DIAGNOSIS — M6281 Muscle weakness (generalized): Secondary | ICD-10-CM

## 2016-08-11 DIAGNOSIS — R293 Abnormal posture: Secondary | ICD-10-CM

## 2016-08-11 DIAGNOSIS — M25571 Pain in right ankle and joints of right foot: Secondary | ICD-10-CM

## 2016-08-11 NOTE — Therapy (Signed)
[place], [place], [address] Phone: [phone]   Fax:  [phone]  Physical Therapy Treatment  Patient Details  Name: Nicole Baxter MRN: [id number] Date of Birth: 21-Sep-1941 Referring Provider: Duwaine Maxin   Encounter Date: 08/11/2016      PT End of Session - 08/11/16 1232    Visit Number 16   Number of Visits 21   Date for PT Re-Evaluation 08/30/16   Authorization Type [employer] (G-codes done 02/16/2023 visit)   Authorization Time Period 08/18/18 to 03/15/39; recert done 06/29/43; recert done 06/28/84   Authorization - Visit Number 16   Authorization - Number of Visits 23   PT Start Time 1031   PT Stop Time 1100  patient having severe pain during activity, ended session with instructions to go to MD    PT Time Calculation (min) 29 min   Activity Tolerance Patient limited by pain   Behavior During Therapy [facility] for tasks assessed/performed      Past Medical History:  Diagnosis Date  . Arthritis   . GERD (gastroesophageal reflux disease)    pt. states she no longer has GERD (09/11/15)  . Hyperglycemia    isolated FBS 122, 95, 104 (Random 138)  . Hyperlipidemia    on pravastatin  . Hypertension    controlled on 2 agents  . 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 Obesity   . Parkinson's disease (tremor, stiffness, slow motion, unstable posture) ([place])   . Shortness of breath dyspnea    pt. states that she no longer has SOB )09/11/15  . Tremor     Past Surgical History:  Procedure Laterality Date  . CESAREAN SECTION    . COLONOSCOPY    . TOTAL KNEE ARTHROPLASTY Left 09/23/2015   Procedure: LEFT TOTAL KNEE ARTHROPLASTY;  Surgeon: Ninetta Lights, MD;  Location: [facility];  Service: Orthopedics;  Laterality: Left;    There were no vitals filed for this visit.      Subjective Assessment - 08/11/16 1039    Subjective Patient arrives today stating that she started having right  knee pain  yesterday; she felt OK after Tuesday's treatment and she feels like its the weather causing problems more than anything. Her left arm is feeling better and she has not been to MD after her fall earlier this week.    Pertinent History blood thinner (now currently taking per patient- will check with MD), HTN, GERD, DM, Parkinson, OA, SOB, hx of acute blood clot in January 2017; history of macular degeneration   Patient Stated Goals walking better, better mobility    Currently in Pain? Yes   Pain Score 2    Pain Location Knee   Pain Orientation Right   Pain Descriptors / Indicators Sharp;Nagging   Pain Type Acute pain   Pain Radiating Towards none    Pain Onset Yesterday   Pain Frequency Constant   Aggravating Factors  not sure    Pain Relieving Factors not sure    Effect of Pain on Daily Activities impacting gait quality            [facility] PT Assessment - 08/11/16 0001      Observation/Other Assessments   Observations no increased edema, discoloration, open wounds, signs of severe injury eitehr knee; tenderness to anterior L knee with palpation; no significatn difference in MMT or ROM/flexilibity  [facility] Adult PT Treatment/Exercise - 08/11/16 0001      Ambulation/Gait   Gait Comments cues for step length/posture during gait through clinic            PWR [facility]) - 08/11/16 1231    Comments attempted seated PWR moves but patient unable to participate due to pain L knee and L UE              PT Education - 08/11/16 1231    Education provided Yes   Education Details recommend going to see MD due to increased pain and inabiilty to correctly/safely participate in PT today secondary to pain   Person(s) Educated Patient   Methods Explanation   Comprehension Verbalized understanding          PT Short Term Goals - 08/02/16 1021      PT SHORT TERM GOAL #1   Title Patient to be able to complete all bed mobility with Min assist in order to  demonstrate improved mobility and need for less assistance from family at home    Baseline 9/5- Min(A)   Time 4   Period Weeks   Status Achieved     PT SHORT TERM GOAL #2   Title Patient to be able to complete all stand to sit/sit to stand transfers from various height surfaces with Min assist in order to demonstrate improved mobility and need for less assistance from family at home    Baseline 9/5- can take a couple tries but she is able with min guard/S    Time 4   Period Weeks   Status Achieved     PT SHORT TERM GOAL #3   Title Patient to demonstrate consistent improvements in gait pattern with LRAD, including minimal festination, increased step lengths/stance times, improved posture, and minimal instabilty in order to improve moblity at home and in community    Baseline 9/5- improving but deteriorates with fatigue    Time 4   Period Weeks   Status On-going     PT SHORT TERM GOAL #4   Title Patient to demonstrate improved posture at least 70% of the time  in order to improve balance and overall mechanics of mobility    Time 4   Period Weeks   Status On-going     PT SHORT TERM GOAL #5   Title Patient to experience R ankle pain no more than 2/10 in order to improve QOL and increase tolerance of weight bearing activities    Baseline 9/5- usually not too high but can get to 7/10 with extended walking    Time 4   Period Weeks   Status On-going     PT SHORT TERM GOAL #6   Title Patient to be independent in correctly and consistently performing appropriate HEP, to be updated PRN    Time 4   Period Weeks   Status On-going           PT Long Term Goals - 08/02/16 1024      PT LONG TERM GOAL #1   Title Patient to be able to complete all bed mobility and sit to stand/stand to sit transfers with LRAD and Mod(I) in order to demonstrate improved mobility and overall QOL    Time 8   Period Weeks   Status On-going     PT LONG TERM GOAL #2   Title Patient to be able to ambulate  for at least 20 minutes with mechanics WFL, LRAD, pain no more than 1/10 in  order to demonstrate improved mobilty and overall QOL    Time 8   Period Weeks   Status On-going     PT LONG TERM GOAL #3   Title Patient to demonstrate strength 4/5 in all tested muscle groups in order to assist in improving mobiltiy, reducing pain, and improving balance    Time 8   Period Weeks   Status Partially Met     PT LONG TERM GOAL #4   Title Patient to demonstrate full R ankle ROM and experience no more than occasional R ankle pain 1/10 in order to improve tolerance to mobility and assist in improving QOL    Time 8   Period Weeks   Status On-going     PT LONG TERM GOAL #5   Title Patient to be participatory in regular exercise at least 20 minutes in duration, of moderate intensity, at least 4 days per week, in order to maintain functional gains and assist in managing parkinsonian symptoms    Time 8   Period Weeks   Status On-going     PT LONG TERM GOAL #6   Title Patient to report no falls within the past 4 weeks in order to demonstrate improved safety at home and within community    Baseline 9/5- reports this is going well    Time 8   Period Weeks   Status On-going               Plan - 08/11/16 1232    Clinical Impression Statement Patient arrives today reporting that her knees are bothering her (initially reported R then stated that it was actually her L); upon examination of B knees did not note significant edema, discoloration, no open wounds or bruising anterior/lateral knees, no differences in ROM or MMT noted today as well. Attempted PWR drills in sitting however patient unable to complete due to pain L knee and L UE, which patient reports she fell on earlier this week although she was able to participate in PT on Tuesday with no increased pain. Ended session early and strongly advised patient to see MD as soon as possible/before next skilled PT session especially with post-fall  status.    Rehab Potential Good   PT Frequency 2x / week   PT Duration 4 weeks   PT Treatment/Interventions ADLs/Self Care Home Management;Biofeedback;Gait training;Stair training;Functional mobility training;Therapeutic activities;Therapeutic exercise;Balance training;Neuromuscular re-education;Patient/family education;Manual techniques;Passive range of motion;Energy conservation;Taping   PT Next Visit Plan Increase focus on ankle exercises and PWR! drills.  Decrease focus on manual unless needed for pain reduction/control.    Consulted and Agree with Plan of Care Patient   Family Member Consulted spouse       Patient will benefit from skilled therapeutic intervention in order to improve the following deficits and impairments:  Abnormal gait, Decreased endurance, Hypomobility, Impaired tone, Decreased activity tolerance, Decreased strength, Pain, Decreased balance, Decreased mobility, Difficulty walking, Decreased range of motion, Improper body mechanics, Decreased coordination, Decreased safety awareness, Impaired flexibility, Postural dysfunction, Decreased knowledge of use of DME  Visit Diagnosis: Unsteadiness on feet  Difficulty in walking, not elsewhere classified  History of falling  Muscle weakness (generalized)  Abnormal posture  Pain in right ankle and joints of right foot  Stiffness of right ankle, not elsewhere classified     Problem List Patient Active Problem List   Diagnosis Date Noted  . Achilles tendinitis of right lower extremity 04/05/2016  . Hypertensive retinopathy of both eyes, grade 2 01/15/2016  . Left  leg DVT ([place]) 12/18/2015  . 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 Diverticulosis 03/10/2015  . Internal hemorrhoids 03/10/2015  . History of colon polyps 12/25/2014  . Health care maintenance 02/13/2014  . Subclinical hyperthyroidism 12/25/2013  . Right renal mass 06/11/2012  . NSAID-associated gastropathy 06/06/2012  .  Osteoarthritis of both knees 03/15/2012  . Parkinsonism ([place]) 07/13/2010  . Diabetes mellitus type 2, diet-controlled ([place]) 05/22/2009  . ALLERGIC RHINITIS, SEASONAL 02/20/2007  . Hyperlipidemia 09/27/2006  . Essential hypertension 09/27/2006  . GERD 09/27/2006    Deniece Ree PT, DPT [facility] [address] [place], [place], [address] Phone: [phone]   Fax:  [phone]  Name: ELBONY MCCLIMANS MRN: [id number] Date of Birth: 1940-12-31

## 2016-08-15 ENCOUNTER — Telehealth: Payer: Self-pay | Admitting: Internal Medicine

## 2016-08-15 NOTE — Telephone Encounter (Signed)
APT. REMINDER CALL, LMTCB °

## 2016-08-16 ENCOUNTER — Encounter: Payer: Self-pay | Admitting: Internal Medicine

## 2016-08-16 ENCOUNTER — Ambulatory Visit (INDEPENDENT_AMBULATORY_CARE_PROVIDER_SITE_OTHER): Payer: PPO | Admitting: Internal Medicine

## 2016-08-16 ENCOUNTER — Encounter (INDEPENDENT_AMBULATORY_CARE_PROVIDER_SITE_OTHER): Payer: Self-pay

## 2016-08-16 VITALS — BP 136/98 | HR 91 | Temp 98.3°F | Ht 61.5 in | Wt 139.9 lb

## 2016-08-16 DIAGNOSIS — N2889 Other specified disorders of kidney and ureter: Secondary | ICD-10-CM

## 2016-08-16 DIAGNOSIS — I82402 Acute embolism and thrombosis of unspecified deep veins of left lower extremity: Secondary | ICD-10-CM

## 2016-08-16 DIAGNOSIS — E785 Hyperlipidemia, unspecified: Secondary | ICD-10-CM

## 2016-08-16 DIAGNOSIS — I10 Essential (primary) hypertension: Secondary | ICD-10-CM

## 2016-08-16 DIAGNOSIS — M25512 Pain in left shoulder: Secondary | ICD-10-CM | POA: Diagnosis not present

## 2016-08-16 DIAGNOSIS — M7661 Achilles tendinitis, right leg: Secondary | ICD-10-CM

## 2016-08-16 DIAGNOSIS — R8299 Other abnormal findings in urine: Secondary | ICD-10-CM

## 2016-08-16 DIAGNOSIS — R3989 Other symptoms and signs involving the genitourinary system: Secondary | ICD-10-CM | POA: Diagnosis not present

## 2016-08-16 DIAGNOSIS — G20 Parkinson's disease: Secondary | ICD-10-CM

## 2016-08-16 MED ORDER — CARBIDOPA-LEVODOPA 25-100 MG PO TABS: 1.0000 | ORAL_TABLET | Freq: Three times a day (TID) | ORAL | 4 refills | Status: DC

## 2016-08-16 MED ORDER — DICLOFENAC SODIUM 1 % TD GEL: TRANSDERMAL | 1 refills | Status: DC

## 2016-08-16 MED ORDER — ATORVASTATIN CALCIUM 40 MG PO TABS: 40.0000 mg | ORAL_TABLET | Freq: Every day | ORAL | 3 refills | Status: DC

## 2016-08-16 MED ORDER — APIXABAN 5 MG PO TABS: 5.0000 mg | ORAL_TABLET | Freq: Two times a day (BID) | ORAL | 2 refills | Status: DC

## 2016-08-16 NOTE — Assessment & Plan Note (Signed)
BP Readings from Last 3 Encounters:  08/16/16 (!) 136/98  07/22/16 131/89  07/12/16 (!) 145/80    Lab Results  Component Value Date   NA 141 10/14/2015   K 4.5 10/14/2015   CREATININE 0.87 10/14/2015    Assessment: Blood pressure control:  well controlled Progress toward BP goal:   near goal Comments: not taking her HCTZ. On lisinopril  Plan: Medications:  c/w lisinopril only for now Educational resources provided: brochure (denies need ) Self management tools provided:   Other plans:

## 2016-08-16 NOTE — Assessment & Plan Note (Signed)
-   Patient has worsening resting tremor - She was supposed to follow up with her neurologist but has not made follow up appointment - Refilled sinemet today - Patient to make follow up appointment with neurology

## 2016-08-16 NOTE — Progress Notes (Signed)
   Subjective:    Patient ID: Nicole Baxter, female    DOB: 09/28/1941, 75 y.o.   MRN: [id number]  HPI  Patient seen and examined. She is here for routine follow up of her achilles tendinitis and HTN.  Patient states her ankle pain has improved tremendously with PT and voltaren gel and she is able to exercise with PT.   She does state she fell on her left shoulder recently while standing at the refrigerator. She does complain of some pain and difficulty moving her left arm secondary to pain. Denies redness or swelling at the site  She also notes worsening tremors in her hands and has not followed up with her neurologist recently for her Parkinson's.  Her significant other states he has noted her urine was discolored and he wanted to have this checked out. Patient denies this.   Review of Systems  Constitutional: Negative.   HENT: Negative.   Respiratory: Negative.   Cardiovascular: Negative.   Gastrointestinal: Negative.   Genitourinary: Negative for difficulty urinating, dysuria, flank pain and frequency.       Urine discoloration  Musculoskeletal: Negative.   Skin: Negative.   Neurological: Positive for tremors. Negative for dizziness.  Psychiatric/Behavioral: Negative.        Objective:   Physical Exam  Constitutional: She is oriented to person, place, and time. She appears well-developed and well-nourished.  HENT:  Head: Normocephalic and atraumatic.  Cardiovascular: Normal rate, regular rhythm and normal heart sounds.   Pulmonary/Chest: Breath sounds normal. No respiratory distress. She has no wheezes.  Abdominal: Soft. She exhibits no distension. There is no tenderness.  Musculoskeletal: She exhibits no edema.  Difficulty raising her left arm over her head No swelling or tenderness to palpation over left shoulder   Neurological: She is alert and oriented to person, place, and time.  B/l pill rolling resting tremor +  Skin: Skin is warm. No rash noted. No erythema.    Psychiatric: She has a normal mood and affect. Her behavior is normal.          Assessment & Plan:  Please see problem based charting for assessment and plan:

## 2016-08-16 NOTE — Assessment & Plan Note (Signed)
-   Patient had a mechanical fall recently and hit her left shoulder - No tenderness or swelling at site but does have pain on moving her arm - Will check X ray of her left shoulder to evaluate

## 2016-08-16 NOTE — Assessment & Plan Note (Signed)
-   Much improved now - c/w PT and voltaren gel prn - No tenderness to palpation and swelling has resolved - No further w/u for now

## 2016-08-16 NOTE — Assessment & Plan Note (Signed)
-   Patient with urine discoloration per husband - No fevers, no increased frequency, no urgency, no dysuria - Will check u/a to assess for hematuria

## 2016-08-16 NOTE — Patient Instructions (Addendum)
-   It was a pleasure seeing you today - Please follow up in 3 months - Please follow up with your neurologist as soon as possible for your Parkinson's - We will check some blood work on your follow up - Please follow up for your mammogram

## 2016-08-16 NOTE — Assessment & Plan Note (Signed)
-   c/w lipitor for now. She has a 10 year ASCVD risk score of 28.8% - Will refill lipitor today

## 2016-08-16 NOTE — Assessment & Plan Note (Signed)
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

## 2016-08-16 NOTE — Addendum Note (Signed)
Addended by: Earl LagosNARENDRA, Yalexa Blust on: 08/16/2016 04:50 PM   Modules accepted: Orders

## 2016-08-16 NOTE — Assessment & Plan Note (Signed)
-   Patient is compliant with her Eliquis.  - Denies easy bruisability, no melena, no BRBPR, no hematemesis, no joint swelling - c/w eliquis for now. Should finish in November

## 2016-08-17 ENCOUNTER — Ambulatory Visit (HOSPITAL_COMMUNITY): Payer: PPO | Admitting: Physical Therapy

## 2016-08-17 DIAGNOSIS — R293 Abnormal posture: Secondary | ICD-10-CM

## 2016-08-17 DIAGNOSIS — R2681 Unsteadiness on feet: Secondary | ICD-10-CM | POA: Diagnosis not present

## 2016-08-17 DIAGNOSIS — M25571 Pain in right ankle and joints of right foot: Secondary | ICD-10-CM

## 2016-08-17 DIAGNOSIS — R262 Difficulty in walking, not elsewhere classified: Secondary | ICD-10-CM

## 2016-08-17 DIAGNOSIS — M6281 Muscle weakness (generalized): Secondary | ICD-10-CM

## 2016-08-17 DIAGNOSIS — Z9181 History of falling: Secondary | ICD-10-CM

## 2016-08-17 DIAGNOSIS — M25671 Stiffness of right ankle, not elsewhere classified: Secondary | ICD-10-CM

## 2016-08-17 LAB — URINALYSIS, ROUTINE W REFLEX MICROSCOPIC
BILIRUBIN UA: NEGATIVE
GLUCOSE, UA: NEGATIVE
Ketones, UA: NEGATIVE
Leukocytes, UA: NEGATIVE
NITRITE UA: NEGATIVE
PROTEIN UA: NEGATIVE
RBC UA: NEGATIVE
Specific Gravity, UA: 1.017 (ref 1.005–1.030)
UUROB: 0.2 mg/dL (ref 0.2–1.0)
pH, UA: 6 (ref 5.0–7.5)

## 2016-08-17 NOTE — Therapy (Signed)
[place], [place], [address] Phone: [phone]   Fax:  [phone]  Physical Therapy Treatment  Patient Details  Name: Nicole Baxter MRN: [id number] Date of Birth: 03-15-1941 Referring Provider: Duwaine Maxin   Encounter Date: 08/17/2016      PT End of Session - 08/17/16 1047    Visit Number 17   Number of Visits 21   Date for PT Re-Evaluation 08/30/16   Authorization Type [employer] (G-codes done 12-27-22 visit)   Authorization Time Period 6/96/78 to 9/38/10; recert done 12/04/49; recert done 0/2/58   Authorization - Visit Number 17   Authorization - Number of Visits 23   PT Start Time 0950   PT Stop Time 1032   PT Time Calculation (min) 42 min   Equipment Utilized During Treatment Gait belt   Activity Tolerance Patient tolerated treatment well   Behavior During Therapy [facility] for tasks assessed/performed      Past Medical History:  Diagnosis Date  . Arthritis   . GERD (gastroesophageal reflux disease)    pt. states she no longer has GERD (09/11/15)  . Hyperglycemia    isolated FBS 122, 95, 104 (Random 138)  . Hyperlipidemia    on pravastatin  . Hypertension    controlled on 2 agents  . 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 Obesity   . Parkinson's disease (tremor, stiffness, slow motion, unstable posture) ([place])   . Shortness of breath dyspnea    pt. states that she no longer has SOB )09/11/15  . Tremor     Past Surgical History:  Procedure Laterality Date  . CESAREAN SECTION    . COLONOSCOPY    . TOTAL KNEE ARTHROPLASTY Left 09/23/2015   Procedure: LEFT TOTAL KNEE ARTHROPLASTY;  Surgeon: Ninetta Lights, MD;  Location: [facility];  Service: Orthopedics;  Laterality: Left;    There were no vitals filed for this visit.      Subjective Assessment - 08/17/16 0955    Subjective (P)  Patient states she went to the MD yesterday; she says that she is supposed to get an x-ray of her L  UE soon. Her arm is feeling better than it was on Thursday, just kind of stiff. Her pain is fairly mild today, much better than it was. No falls or close calls recently.    Patient is accompained by: (P)  Family member   Pertinent History (P)  blood thinner (now currently taking per patient- will check with MD), HTN, GERD, DM, Parkinson, OA, SOB, hx of acute blood clot in January 2017; history of macular degeneration   Patient Stated Goals (P)  walking better, better mobility    Currently in Pain? (P)  No/denies                         [facility] Adult PT Treatment/Exercise - 08/17/16 0001      Ambulation/Gait   Gait Comments gait approximately 73f no device, approximately 1648fwith walker, cues for step length and posture, heel-toe            PWR ([facility]) - 08/17/16 1045    PWR Step 2x5in standing in parallel    Comments PWR combination movements 1x10             PT Education - 08/17/16 1046    Education provided Yes   Education Details do not try walking without rollator at home due  to fall risk    Person(s) Educated Patient   Methods Explanation   Comprehension Verbalized understanding          PT Short Term Goals - 08/02/16 1021      PT SHORT TERM GOAL #1   Title Patient to be able to complete all bed mobility with Min assist in order to demonstrate improved mobility and need for less assistance from family at home    Baseline 9/5- Min(A)   Time 4   Period Weeks   Status Achieved     PT SHORT TERM GOAL #2   Title Patient to be able to complete all stand to sit/sit to stand transfers from various height surfaces with Min assist in order to demonstrate improved mobility and need for less assistance from family at home    Baseline 9/5- can take a couple tries but she is able with min guard/S    Time 4   Period Weeks   Status Achieved     PT SHORT TERM GOAL #3   Title Patient to demonstrate consistent improvements in gait pattern with LRAD,  including minimal festination, increased step lengths/stance times, improved posture, and minimal instabilty in order to improve moblity at home and in community    Baseline 9/5- improving but deteriorates with fatigue    Time 4   Period Weeks   Status On-going     PT SHORT TERM GOAL #4   Title Patient to demonstrate improved posture at least 70% of the time  in order to improve balance and overall mechanics of mobility    Time 4   Period Weeks   Status On-going     PT SHORT TERM GOAL #5   Title Patient to experience R ankle pain no more than 2/10 in order to improve QOL and increase tolerance of weight bearing activities    Baseline 9/5- usually not too high but can get to 7/10 with extended walking    Time 4   Period Weeks   Status On-going     PT SHORT TERM GOAL #6   Title Patient to be independent in correctly and consistently performing appropriate HEP, to be updated PRN    Time 4   Period Weeks   Status On-going           PT Long Term Goals - 08/02/16 1024      PT LONG TERM GOAL #1   Title Patient to be able to complete all bed mobility and sit to stand/stand to sit transfers with LRAD and Mod(I) in order to demonstrate improved mobility and overall QOL    Time 8   Period Weeks   Status On-going     PT LONG TERM GOAL #2   Title Patient to be able to ambulate for at least 20 minutes with mechanics WFL, LRAD, pain no more than 1/10 in order to demonstrate improved mobilty and overall QOL    Time 8   Period Weeks   Status On-going     PT LONG TERM GOAL #3   Title Patient to demonstrate strength 4/5 in all tested muscle groups in order to assist in improving mobiltiy, reducing pain, and improving balance    Time 8   Period Weeks   Status Partially Met     PT LONG TERM GOAL #4   Title Patient to demonstrate full R ankle ROM and experience no more than occasional R ankle pain 1/10 in order to improve tolerance to mobility and assist  in improving QOL    Time 8    Period Weeks   Status On-going     PT LONG TERM GOAL #5   Title Patient to be participatory in regular exercise at least 20 minutes in duration, of moderate intensity, at least 4 days per week, in order to maintain functional gains and assist in managing parkinsonian symptoms    Time 8   Period Weeks   Status On-going     PT LONG TERM GOAL #6   Title Patient to report no falls within the past 4 weeks in order to demonstrate improved safety at home and within community    Baseline 9/5- reports this is going well    Time 8   Period Weeks   Status On-going               Plan - 08/17/16 1048    Clinical Impression Statement Patient arrives reporting that she is feeling better, her MD is going to do an x-ray on her L UE and it is feeling much better today than it was Thursday anyway. Focused on standing PWR exercises in parallel bars today, with combination PWR rock/twist as well as PWR stepping in bars. Also worked on gait with no device today with min guard, also distance ambulation with walker. Patient limited by fatigue this session.    Rehab Potential Good   PT Frequency 2x / week   PT Duration 4 weeks   PT Treatment/Interventions ADLs/Self Care Home Management;Biofeedback;Gait training;Stair training;Functional mobility training;Therapeutic activities;Therapeutic exercise;Balance training;Neuromuscular re-education;Patient/family education;Manual techniques;Passive range of motion;Energy conservation;Taping   PT Next Visit Plan Increase focus on ankle exercises and PWR! drills.  Decrease focus on manual unless needed for pain reduction/control.    Consulted and Agree with Plan of Care Patient   Family Member Consulted spouse       Patient will benefit from skilled therapeutic intervention in order to improve the following deficits and impairments:  Abnormal gait, Decreased endurance, Hypomobility, Impaired tone, Decreased activity tolerance, Decreased strength, Pain, Decreased  balance, Decreased mobility, Difficulty walking, Decreased range of motion, Improper body mechanics, Decreased coordination, Decreased safety awareness, Impaired flexibility, Postural dysfunction, Decreased knowledge of use of DME  Visit Diagnosis: Unsteadiness on feet  Difficulty in walking, not elsewhere classified  History of falling  Muscle weakness (generalized)  Abnormal posture  Pain in right ankle and joints of right foot  Stiffness of right ankle, not elsewhere classified     Problem List Patient Active Problem List   Diagnosis Date Noted  . Urine discoloration 08/16/2016  . Left shoulder pain 08/16/2016  . Achilles tendinitis of right lower extremity 04/05/2016  . Hypertensive retinopathy of both eyes, grade 2 01/15/2016  . Left leg DVT ([place]) 12/18/2015  . 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 Internal hemorrhoids 03/10/2015  . History of colon polyps 12/25/2014  . Health care maintenance 02/13/2014  . Subclinical hyperthyroidism 12/25/2013  . Right renal mass 06/11/2012  . Osteoarthritis of both knees 03/15/2012  . Parkinsonism ([place]) 07/13/2010  . Diabetes mellitus type 2, diet-controlled ([place]) 05/22/2009  . ALLERGIC RHINITIS, SEASONAL 02/20/2007  . Hyperlipidemia 09/27/2006  . Essential hypertension 09/27/2006  . GERD 09/27/2006    Deniece Ree PT, DPT [facility] [address] [place], [place], [address] Phone: [phone]   Fax:  [phone]  Name: MAHALEY SCHWERING MRN: [id number] Date of Birth: 01-29-1941

## 2016-08-18 ENCOUNTER — Ambulatory Visit (HOSPITAL_COMMUNITY): Payer: PPO | Admitting: Physical Therapy

## 2016-08-18 DIAGNOSIS — R262 Difficulty in walking, not elsewhere classified: Secondary | ICD-10-CM

## 2016-08-18 DIAGNOSIS — M6281 Muscle weakness (generalized): Secondary | ICD-10-CM

## 2016-08-18 DIAGNOSIS — R2681 Unsteadiness on feet: Secondary | ICD-10-CM

## 2016-08-18 DIAGNOSIS — M25571 Pain in right ankle and joints of right foot: Secondary | ICD-10-CM

## 2016-08-18 DIAGNOSIS — M25671 Stiffness of right ankle, not elsewhere classified: Secondary | ICD-10-CM

## 2016-08-18 DIAGNOSIS — Z9181 History of falling: Secondary | ICD-10-CM

## 2016-08-18 DIAGNOSIS — R293 Abnormal posture: Secondary | ICD-10-CM

## 2016-08-18 NOTE — Therapy (Signed)
[place], [place], [address] Phone: [phone]   Fax:  [phone]  Physical Therapy Treatment  Patient Details  Name: Nicole Baxter MRN: [id number] Date of Birth: [birth year] Referring Provider: Duwaine Maxin   Encounter Date: 08/18/2016      PT End of Session - 08/18/16 1119    Visit Number 18   Number of Visits 21   Date for PT Re-Evaluation 08/30/16   Authorization Type [employer] (G-codes done 09-Jan-2023 visit)   Authorization Time Period 0/24/09 to 7/35/32; recert done 08/07/23; recert done 01/03/82   Authorization - Visit Number 18   Authorization - Number of Visits 23   PT Start Time 1030   PT Stop Time 1109   PT Time Calculation (min) 39 min   Equipment Utilized During Treatment Gait belt   Activity Tolerance Patient tolerated treatment well   Behavior During Therapy [facility] for tasks assessed/performed      Past Medical History:  Diagnosis Date  . Arthritis   . GERD (gastroesophageal reflux disease)    pt. states she no longer has GERD (09/11/15)  . Hyperglycemia    isolated FBS 122, 95, 104 (Random 138)  . Hyperlipidemia    on pravastatin  . Hypertension    controlled on 2 agents  . 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 Obesity   . Parkinson's disease (tremor, stiffness, slow motion, unstable posture) ([place])   . Shortness of breath dyspnea    pt. states that she no longer has SOB )09/11/15  . Tremor     Past Surgical History:  Procedure Laterality Date  . CESAREAN SECTION    . COLONOSCOPY    . TOTAL KNEE ARTHROPLASTY Left 09/23/2015   Procedure: LEFT TOTAL KNEE ARTHROPLASTY;  Surgeon: Ninetta Lights, MD;  Location: [place];  Service: Orthopedics;  Laterality: Left;    There were no vitals filed for this visit.      Subjective Assessment - 08/18/16 1034    Subjective (P)  Patient arrives stating she is feeling good and the soreness in her L shoulder has gone away. No  falls since last time she came.    Patient is accompained by: (P)  Family member   Currently in Pain? (P)  No/denies                         [facility] Adult PT Treatment/Exercise - 08/18/16 0001      Ambulation/Gait   Gait Comments gait in parallel bars with no device, outside of bars with rollator, cues for posture, heel-toe, step lengths            PWR [facility]) - 08/18/16 1130    PWR! Rock 2x5 standing, cones    PWR! Twist 2x5, standing cones    PWR Step 6x2, hurdles standing              PT Education - 08/18/16 1125    Education provided No          PT Short Term Goals - 08/02/16 1021      PT SHORT TERM GOAL #1   Title Patient to be able to complete all bed mobility with Min assist in order to demonstrate improved mobility and need for less assistance from family at home    Baseline 9/5- Min(A)   Time 4   Period Weeks   Status Achieved     PT SHORT  TERM GOAL #2   Title Patient to be able to complete all stand to sit/sit to stand transfers from various height surfaces with Min assist in order to demonstrate improved mobility and need for less assistance from family at home    Baseline 9/5- can take a couple tries but she is able with min guard/S    Time 4   Period Weeks   Status Achieved     PT SHORT TERM GOAL #3   Title Patient to demonstrate consistent improvements in gait pattern with LRAD, including minimal festination, increased step lengths/stance times, improved posture, and minimal instabilty in order to improve moblity at home and in community    Baseline 9/5- improving but deteriorates with fatigue    Time 4   Period Weeks   Status On-going     PT SHORT TERM GOAL #4   Title Patient to demonstrate improved posture at least 70% of the time  in order to improve balance and overall mechanics of mobility    Time 4   Period Weeks   Status On-going     PT SHORT TERM GOAL #5   Title Patient to experience R ankle pain no more than 2/10 in  order to improve QOL and increase tolerance of weight bearing activities    Baseline 9/5- usually not too high but can get to 7/10 with extended walking    Time 4   Period Weeks   Status On-going     PT SHORT TERM GOAL #6   Title Patient to be independent in correctly and consistently performing appropriate HEP, to be updated PRN    Time 4   Period Weeks   Status On-going           PT Long Term Goals - 08/02/16 1024      PT LONG TERM GOAL #1   Title Patient to be able to complete all bed mobility and sit to stand/stand to sit transfers with LRAD and Mod(I) in order to demonstrate improved mobility and overall QOL    Time 8   Period Weeks   Status On-going     PT LONG TERM GOAL #2   Title Patient to be able to ambulate for at least 20 minutes with mechanics WFL, LRAD, pain no more than 1/10 in order to demonstrate improved mobilty and overall QOL    Time 8   Period Weeks   Status On-going     PT LONG TERM GOAL #3   Title Patient to demonstrate strength 4/5 in all tested muscle groups in order to assist in improving mobiltiy, reducing pain, and improving balance    Time 8   Period Weeks   Status Partially Met     PT LONG TERM GOAL #4   Title Patient to demonstrate full R ankle ROM and experience no more than occasional R ankle pain 1/10 in order to improve tolerance to mobility and assist in improving QOL    Time 8   Period Weeks   Status On-going     PT LONG TERM GOAL #5   Title Patient to be participatory in regular exercise at least 20 minutes in duration, of moderate intensity, at least 4 days per week, in order to maintain functional gains and assist in managing parkinsonian symptoms    Time 8   Period Weeks   Status On-going     PT LONG TERM GOAL #6   Title Patient to report no falls within the past 4 weeks in   order to demonstrate improved safety at home and within community    Baseline 9/5- reports this is going well    Time 8   Period Weeks   Status  On-going               Plan - 08/18/16 1125    Clinical Impression Statement Continued work with standing PWR exercises, including PWR twists, PWR twists, and also PWR steps forward over small hurdles today. Provided min guard and cues for form throughout today's session; she does continue to be limited by pain in her L UE but was able to perform a couple of reps on this side today of PWR twists and rocks. Gait within parallel bars with no device, as well as outside of bars with rollator, cues for posture, heel-toe and step length today, gait period limited by R knee pain. Patient continues to progress with skilled PT services, and continues to report that her R ankle symptoms remain resolved.    Rehab Potential Good   PT Frequency 2x / week   PT Duration 4 weeks   PT Treatment/Interventions ADLs/Self Care Home Management;Biofeedback;Gait training;Stair training;Functional mobility training;Therapeutic activities;Therapeutic exercise;Balance training;Neuromuscular re-education;Patient/family education;Manual techniques;Passive range of motion;Energy conservation;Taping   PT Next Visit Plan Increase focus on ankle exercises and PWR! drills.  Decrease focus on manual unless needed for pain reduction/control. Continue to work on gait with no device, trial cane.    PT Home Exercise Plan updated heel toe gait    Consulted and Agree with Plan of Care Patient   Family Member Consulted spouse       Patient will benefit from skilled therapeutic intervention in order to improve the following deficits and impairments:  Abnormal gait, Decreased endurance, Hypomobility, Impaired tone, Decreased activity tolerance, Decreased strength, Pain, Decreased balance, Decreased mobility, Difficulty walking, Decreased range of motion, Improper body mechanics, Decreased coordination, Decreased safety awareness, Impaired flexibility, Postural dysfunction, Decreased knowledge of use of DME  Visit  Diagnosis: Unsteadiness on feet  Difficulty in walking, not elsewhere classified  History of falling  Muscle weakness (generalized)  Abnormal posture  Pain in right ankle and joints of right foot  Stiffness of right ankle, not elsewhere classified     Problem List Patient Active Problem List   Diagnosis Date Noted  . Urine discoloration 08/16/2016  . Left shoulder pain 08/16/2016  . Achilles tendinitis of right lower extremity 04/05/2016  . Hypertensive retinopathy of both eyes, grade 2 01/15/2016  . Left leg DVT (HCC) 12/18/2015  . 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 Internal hemorrhoids 03/10/2015  . History of colon polyps 12/25/2014  . Health care maintenance 02/13/2014  . Subclinical hyperthyroidism 12/25/2013  . Right renal mass 06/11/2012  . Osteoarthritis of both knees 03/15/2012  . Parkinsonism (HCC) 07/13/2010  . Diabetes mellitus type 2, diet-controlled (HCC) 05/22/2009  . ALLERGIC RHINITIS, SEASONAL 02/20/2007  . Hyperlipidemia 09/27/2006  . Essential hypertension 09/27/2006  . GERD 09/27/2006      PT, DPT [phone]  [facility] [address] [place], [place], [address] Phone: [phone]   Fax:  [phone]  Name: Nicole Baxter MRN: [id number] Date of Birth: 09/09/1941    

## 2016-08-23 ENCOUNTER — Encounter: Payer: Self-pay | Admitting: Neurology

## 2016-08-23 ENCOUNTER — Ambulatory Visit (HOSPITAL_COMMUNITY): Payer: PPO | Admitting: Physical Therapy

## 2016-08-25 ENCOUNTER — Ambulatory Visit (HOSPITAL_COMMUNITY): Payer: PPO | Admitting: Physical Therapy

## 2016-08-25 DIAGNOSIS — M6281 Muscle weakness (generalized): Secondary | ICD-10-CM

## 2016-08-25 DIAGNOSIS — R2681 Unsteadiness on feet: Secondary | ICD-10-CM

## 2016-08-25 DIAGNOSIS — Z9181 History of falling: Secondary | ICD-10-CM

## 2016-08-25 DIAGNOSIS — R293 Abnormal posture: Secondary | ICD-10-CM

## 2016-08-25 DIAGNOSIS — R262 Difficulty in walking, not elsewhere classified: Secondary | ICD-10-CM

## 2016-08-25 DIAGNOSIS — M25571 Pain in right ankle and joints of right foot: Secondary | ICD-10-CM

## 2016-08-25 DIAGNOSIS — M25671 Stiffness of right ankle, not elsewhere classified: Secondary | ICD-10-CM

## 2016-08-25 NOTE — Patient Instructions (Signed)
   Seated Hip abduction with TB  Start in sitting with good posture. Activate core stabilizing muscles to protect lumbar spine. Bring knees together and wrap an elastic exercise band around them. Press the knees outward, activating the  hip stabilizers. Hold, then slowly return to starting position. Repeat 10 times, twice a day with red TB.    LAQ/ Long Arc Quad/ Knee Extension with Band  Sit upright at the edge of a seat with foot looped in elastic band. With control, kick the foot out and up to straighten the knee.   Your knee should be pointed towards the ceiling and in line with your foot. Your foot should be flexed at the ankle and the sole of your foot should not turn in or out. Return to starting position with the same level of control.  Repeat 10 times each leg, twice a day.    CLX - DORSIFLEXION  Attach one end of the CLX to your target foot, hook it under your opposite foot and up to your hand.   Next, raise your foot upwards while maintaining your heel on the ground.  Be sure to keep your heel in contact with the floor the entire time.  Repeat 10 times each side, twice a day.

## 2016-08-25 NOTE — Therapy (Signed)
[place], [place], [address] Phone: [phone]   Fax:  [phone]  Physical Therapy Treatment  Patient Details  Name: Nicole Baxter MRN: [id number] Date of Birth: 1941/02/01 Referring Provider: Duwaine Maxin   Encounter Date: 08/25/2016      PT End of Session - 08/25/16 1207    Visit Number 19   Number of Visits 21   Date for PT Re-Evaluation 08/30/16   Authorization Type [employer] (G-codes done 02-21-2023 visit)   Authorization Time Period 6/37/85 to 8/85/02; recert done 06/03/40; recert done 01/05/77   Authorization - Visit Number 24   Authorization - Number of Visits 23   PT Start Time 1037  patient arrived late    PT Stop Time 1117   PT Time Calculation (min) 40 min   Equipment Utilized During Treatment Gait belt   Activity Tolerance Patient tolerated treatment well   Behavior During Therapy [facility] for tasks assessed/performed      Past Medical History:  Diagnosis Date  . Arthritis   . GERD (gastroesophageal reflux disease)    pt. states she no longer has GERD (09/11/15)  . Hyperglycemia    isolated FBS 122, 95, 104 (Random 138)  . Hyperlipidemia    on pravastatin  . Hypertension    controlled on 2 agents  . 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 Obesity   . Parkinson's disease (tremor, stiffness, slow motion, unstable posture) ([place])   . Shortness of breath dyspnea    pt. states that she no longer has SOB )09/11/15  . Tremor     Past Surgical History:  Procedure Laterality Date  . CESAREAN SECTION    . COLONOSCOPY    . TOTAL KNEE ARTHROPLASTY Left 09/23/2015   Procedure: LEFT TOTAL KNEE ARTHROPLASTY;  Surgeon: Ninetta Lights, MD;  Location: [place];  Service: Orthopedics;  Laterality: Left;    There were no vitals filed for this visit.      Subjective Assessment - 08/25/16 1040    Subjective (P)  Patient arrives a few minutes late today stating that her R knee is  remaniing sore and her L UE is feeling better, she doesn't know if she needs x-ray on it or not at this point. Nothing else major going on today.    Patient is accompained by: (P)  Family member   Pertinent History (P)  blood thinner (now currently taking per patient- will check with MD), HTN, GERD, DM, Parkinson, OA, SOB, hx of acute blood clot in January 2017; history of macular degeneration   Patient Stated Goals (P)  walking better, better mobility    Currently in Pain? (P)  No/denies  R knee is occasionally sore, between 2-4/10 on pain scale                          [facility] Adult PT Treatment/Exercise - 08/25/16 0001      Knee/Hip Exercises: Seated   Long Arc Quad Both;1 set;15 reps   Long Arc Quad Weight 2 lbs.   Knee/Hip Flexion 1x10 with 2#    Abduction/Adduction  Both;1 set;15 reps   Abd/Adduction Limitations red TB            PWR [facility] - 08/25/16 1205    PWR! Up 1x10 modified quadruped    PWR! Rock 1x10 modified quadruped    PWR! Twist 1x10 modified quadruped    PWR! Step  1x6 modified quadruped    Comments min guard, cues for form              PT Education - 08/25/16 1207    Education provided Yes   Education Details HEP update    Person(s) Educated Patient;Spouse   Methods Explanation   Comprehension Verbalized understanding          PT Short Term Goals - 08/02/16 1021      PT SHORT TERM GOAL #1   Title Patient to be able to complete all bed mobility with Min assist in order to demonstrate improved mobility and need for less assistance from family at home    Baseline 9/5- Min(A)   Time 4   Period Weeks   Status Achieved     PT SHORT TERM GOAL #2   Title Patient to be able to complete all stand to sit/sit to stand transfers from various height surfaces with Min assist in order to demonstrate improved mobility and need for less assistance from family at home    Baseline 9/5- can take a couple tries but she is able with min guard/S     Time 4   Period Weeks   Status Achieved     PT SHORT TERM GOAL #3   Title Patient to demonstrate consistent improvements in gait pattern with LRAD, including minimal festination, increased step lengths/stance times, improved posture, and minimal instabilty in order to improve moblity at home and in community    Baseline 9/5- improving but deteriorates with fatigue    Time 4   Period Weeks   Status On-going     PT SHORT TERM GOAL #4   Title Patient to demonstrate improved posture at least 70% of the time  in order to improve balance and overall mechanics of mobility    Time 4   Period Weeks   Status On-going     PT SHORT TERM GOAL #5   Title Patient to experience R ankle pain no more than 2/10 in order to improve QOL and increase tolerance of weight bearing activities    Baseline 9/5- usually not too high but can get to 7/10 with extended walking    Time 4   Period Weeks   Status On-going     PT SHORT TERM GOAL #6   Title Patient to be independent in correctly and consistently performing appropriate HEP, to be updated PRN    Time 4   Period Weeks   Status On-going           PT Long Term Goals - 08/02/16 1024      PT LONG TERM GOAL #1   Title Patient to be able to complete all bed mobility and sit to stand/stand to sit transfers with LRAD and Mod(I) in order to demonstrate improved mobility and overall QOL    Time 8   Period Weeks   Status On-going     PT LONG TERM GOAL #2   Title Patient to be able to ambulate for at least 20 minutes with mechanics WFL, LRAD, pain no more than 1/10 in order to demonstrate improved mobilty and overall QOL    Time 8   Period Weeks   Status On-going     PT LONG TERM GOAL #3   Title Patient to demonstrate strength 4/5 in all tested muscle groups in order to assist in improving mobiltiy, reducing pain, and improving balance    Time 8   Period Weeks   Status Partially  Met     PT LONG TERM GOAL #4   Title Patient to demonstrate full R  ankle ROM and experience no more than occasional R ankle pain 1/10 in order to improve tolerance to mobility and assist in improving QOL    Time 8   Period Weeks   Status On-going     PT LONG TERM GOAL #5   Title Patient to be participatory in regular exercise at least 20 minutes in duration, of moderate intensity, at least 4 days per week, in order to maintain functional gains and assist in managing parkinsonian symptoms    Time 8   Period Weeks   Status On-going     PT LONG TERM GOAL #6   Title Patient to report no falls within the past 4 weeks in order to demonstrate improved safety at home and within community    Baseline 9/5- reports this is going well    Time 8   Period Weeks   Status On-going               Plan - 08/25/16 1212    Clinical Impression Statement Introduced modified quadruped PWR activities today with good tolerance by patient, however verbal cues and occasional tactile cues for form and focus of movement; also incorporated functional targeted LE strengthening at this time, due to patient and spouse request secondary to functional difficulties with weakness at home and in community. Also updated HEP today. Patient continues to appear to be progressing well with PT services, however a POC update may be warranted at next re-assessment.    Rehab Potential Good   PT Frequency 2x / week   PT Duration 4 weeks   PT Treatment/Interventions ADLs/Self Care Home Management;Biofeedback;Gait training;Stair training;Functional mobility training;Therapeutic activities;Therapeutic exercise;Balance training;Neuromuscular re-education;Patient/family education;Manual techniques;Passive range of motion;Energy conservation;Taping   PT Next Visit Plan continue standing PWR exercises, work on functional strength and bed mobility; re-assessment    PT Home Exercise Plan 9/28: heel toe gait, LAQs, toe raises in sitting, seated hip ABD    Consulted and Agree with Plan of Care Patient    Family Member Consulted spouse       Patient will benefit from skilled therapeutic intervention in order to improve the following deficits and impairments:  Abnormal gait, Decreased endurance, Hypomobility, Impaired tone, Decreased activity tolerance, Decreased strength, Pain, Decreased balance, Decreased mobility, Difficulty walking, Decreased range of motion, Improper body mechanics, Decreased coordination, Decreased safety awareness, Impaired flexibility, Postural dysfunction, Decreased knowledge of use of DME  Visit Diagnosis: Unsteadiness on feet  Difficulty in walking, not elsewhere classified  History of falling  Muscle weakness (generalized)  Abnormal posture  Pain in right ankle and joints of right foot  Stiffness of right ankle, not elsewhere classified     Problem List Patient Active Problem List   Diagnosis Date Noted  . Urine discoloration 08/16/2016  . Left shoulder pain 08/16/2016  . Achilles tendinitis of right lower extremity 04/05/2016  . Hypertensive retinopathy of both eyes, grade 2 01/15/2016  . Left leg DVT (HCC) 12/18/2015  . 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 Internal hemorrhoids 03/10/2015  . History of colon polyps 12/25/2014  . Health care maintenance 02/13/2014  . Subclinical hyperthyroidism 12/25/2013  . Right renal mass 06/11/2012  . Osteoarthritis of both knees 03/15/2012  . Parkinsonism (HCC) 07/13/2010  . Diabetes mellitus type 2, diet-controlled (HCC) 05/22/2009  . ALLERGIC RHINITIS, SEASONAL 02/20/2007  . Hyperlipidemia 09/27/2006  . Essential hypertension 09/27/2006  . GERD  09/27/2006    Deniece Ree PT, DPT [id number]  [facility] [address] [place], [place], [address] Phone: [phone]   Fax:  [phone]  Name: JOAN HERSCHBERGER MRN: [id number] Date of Birth: Sep 23, 1941

## 2016-08-26 NOTE — Progress Notes (Signed)
DATE:  09/28/15  MRN:  [id number]  BIRTHDAY: 1941-03-20  Facility:  Nursing Home Location:  [facility]  Nursing Home Room Number: 1202-P  LEVEL OF CARE:  SNF [phone])  Contact Information    Name Relation Home Work Mobile   [place] Spouse [phone]  [phone]   Parks,Teresa Daughter [phone]  [phone]   Gentry Roch   [phone]       Code Status History    Date Active Date Inactive Code Status Order ID Comments User Context   09/23/2015  2:06 PM 09/26/2015  2:50 PM Full Code [id number]  Cristie Hem, PA-C Inpatient       Chief Complaint  Patient presents with  . 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  She is seen in her room today and complained of constipation.  She has been admitted for a short-term rehabilitation.   PAST MEDICAL HISTORY:  Past Medical History:  Diagnosis Date  . Arthritis   . GERD (gastroesophageal reflux disease)    pt. states she no longer has GERD (09/11/15)  . Hyperglycemia    isolated FBS 122, 95, 104 (Random 138)  . Hyperlipidemia    on pravastatin  . Hypertension    controlled on 2 agents  . 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 Obesity   . Parkinson's disease (tremor, stiffness, slow motion, unstable posture) (HCC)   . Shortness of breath dyspnea    pt. states that she no longer has SOB )09/11/15  . Tremor      CURRENT MEDICATIONS: Reviewed  Patient's Medications  New Prescriptions   No medications on file  Previous Medications   CALCIUM CARBONATE-VITAMIN D (CALCIUM 600/VITAMIN D) 600-400 MG-UNIT TABLET    Take 2 tablets by mouth daily.  Modified Medications   Modified Medication Previous Medication   ALBUTEROL (PROVENTIL HFA;VENTOLIN HFA) 108 (90 BASE) MCG/ACT INHALER albuterol  (PROVENTIL HFA;VENTOLIN HFA) 108 (90 Base) MCG/ACT inhaler      Inhale 2 puffs into the lungs every 6 (six) hours as needed for wheezing or shortness of breath.    Inhale 2 puffs into the lungs every 6 (six) hours as needed for wheezing or shortness of breath.   APIXABAN (ELIQUIS) 2.5 MG TABS TABLET apixaban (ELIQUIS) 2.55 MG TABS tablet 1 tablet PO BID              Take 1 tablet (40 mg total) by mouth daily. Patient requested easy open caps for all medication bottles.    Take 1 tablet (40 mg total) by mouth daily. Patient requested easy open caps for all medication bottles.   CARBIDOPA-LEVODOPA (SINEMET) 25-100 MG TABLET carbidopa-levodopa (SINEMET) 25-100 MG tablet      Take 1 tablet by mouth 3 (three) times daily. Take 4 hours apart.    Take 1 tablet by mouth 3 (three) times daily. Take 4 hours apart.           LISINOPRIL (PRINIVIL,ZESTRIL) 20 MG TABLET lisinopril (PRINIVIL,ZESTRIL) 20 MG tablet      TAKE ONE TABLET BY MOUTH ONCE DAILY    Take 1 tablet (20 mg total) by mouth daily.   HYDROCODONE-ACETAMINOPHEN (NORCO) 5-325 MG TABLET   Take 1 tablet by mouth every 4 (four) hours as needed for moderate pain.    ONDANSETRON (ZOFRAN)  4 MG TABLET      Take 1 tablet (4 mg total) by mouth every 8 (eight) hours as needed for nausea or vomiting.     HYDROCHLOROTHIAZIDE (HYDRODIURIL) 12.5 MG TABLET   Take 1 tablet (12.5 mg total) by mouth daily.      Discontinued Medications   ALBUTEROL (PROAIR HFA) 108 (90 BASE) MCG/ACT INHALER    Inhale 2 puffs into the lungs every 4 (four) hours as needed for wheezing.       BISACODYL (DULCOLAX) 5 MG EC TABLET    Take 1 tablet (5 mg total) by mouth daily as needed for moderate constipation.                          No Known Allergies   REVIEW OF SYSTEMS:  GENERAL: no change in appetite, no fatigue, no weight changes, no fever, chills or weakness EYES: Denies change in vision, dry eyes, eye pain, itching or discharge EARS: Denies change in hearing,  ringing in ears, or earache NOSE: Denies nasal congestion or epistaxis MOUTH and THROAT: Denies oral discomfort, gingival pain or bleeding, pain from teeth or hoarseness   RESPIRATORY: no cough, SOB, DOE, wheezing, hemoptysis CARDIAC: no chest pain, edema or palpitations GI: no abdominal pain, diarrhea, heart burn, nausea or vomiting, +constipation GU: Denies dysuria, frequency, hematuria, incontinence, or discharge NEURO:  +tremors PSYCHIATRIC: Denies feeling of depression or anxiety. No report of hallucinations, insomnia, paranoia, or agitation    PHYSICAL EXAMINATION  GENERAL APPEARANCE: Well nourished. In no acute distress. Normal body habitus SKIN:  Left knee surgical incision has steri-strips, dry and no erythema HEAD: Normal in size and contour. No evidence of trauma EYES: Lids open and close normally. No blepharitis, entropion or ectropion. PERRL. Conjunctivae are clear and sclerae are white. Lenses are without opacity EARS: Pinnae are normal. Patient hears normal voice tunes of the examiner MOUTH and THROAT: Lips are without lesions. Oral mucosa is moist and without lesions. Tongue is normal in shape, size, and color and without lesions NECK: supple, trachea midline, no neck masses, no thyroid tenderness, no thyromegaly LYMPHATICS: no LAN in the neck, no supraclavicular LAN RESPIRATORY: breathing is even & unlabored, BS CTAB CARDIAC: RRR, no murmur,no extra heart sounds, no edema GI: abdomen soft, normal BS, no masses, no tenderness, no hepatomegaly, no splenomegaly EXTREMITIES:  Able to move X 4 extremities NEUROLOGICAL: has tremor. Speech is clear PSYCHIATRIC: Alert and oriented X 3. Affect and behavior are appropriate  LABS/RADIOLOGY: Labs reviewed: Basic Metabolic Panel:  Recent Labs  16/10/96 0419 09/26/15 0542 09/29/15   NA 137 137 140   K 3.9 3.6 4.0   CL 103 99*  --    CO2 29 26  --    GLUCOSE 113* 95  --    BUN 14 14 28*   CREATININE 1.00 0.84 0.9     CALCIUM 8.9 8.7*  --     Liver Function Tests:  Recent Labs  09/11/15 1043   AST 15   ALT 8*   ALKPHOS 96   BILITOT 1.6*   PROT 7.7   ALBUMIN 3.6    CBC:  Recent Labs  09/11/15 1043  09/25/15 0419 09/26/15 0542 09/29/15    WBC 5.7  9.1 7.7 10.0    NEUTROABS 3.2   --   --   --     HGB 13.5  9.9* 10.5* 11.8*    HCT 41.0  30.4* 31.6* 37  MCV 94.7  93.0 93.5  --     PLT 193  176 173 325    < > = values in this interval not displayed.    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 Eulah PontMurphy, orthopedic surgeon, in 1 week  Parkinson's Disease - continue Carbidopa-levodopa 25-100 mg 1 tab PO TID  Constipation - start Senna-S 8.6-50 mg 2 tabs PO BID and Miralax 17 gm PO daily  Anemia, acute blood loss - hgb 10.5; re-check CBC  Hypertension - continue HCTZ 12.5 mg 1 tab PO daily and Lisinopril 20 mg 1 tab PO Q D; check BMP     Goals of care:  Short-term rehabilitation     Kenard GowerMonina Medina-Vargas, NP [facility] [phone]

## 2016-08-27 ENCOUNTER — Other Ambulatory Visit: Payer: Self-pay | Admitting: Internal Medicine

## 2016-08-27 DIAGNOSIS — Z1231 Encounter for screening mammogram for malignant neoplasm of breast: Secondary | ICD-10-CM

## 2016-08-30 ENCOUNTER — Ambulatory Visit (HOSPITAL_COMMUNITY): Payer: PPO | Attending: Internal Medicine | Admitting: Physical Therapy

## 2016-08-30 DIAGNOSIS — R262 Difficulty in walking, not elsewhere classified: Secondary | ICD-10-CM

## 2016-08-30 DIAGNOSIS — M25612 Stiffness of left shoulder, not elsewhere classified: Secondary | ICD-10-CM | POA: Insufficient documentation

## 2016-08-30 DIAGNOSIS — M6281 Muscle weakness (generalized): Secondary | ICD-10-CM | POA: Insufficient documentation

## 2016-08-30 DIAGNOSIS — M25571 Pain in right ankle and joints of right foot: Secondary | ICD-10-CM | POA: Diagnosis not present

## 2016-08-30 DIAGNOSIS — Z9181 History of falling: Secondary | ICD-10-CM | POA: Insufficient documentation

## 2016-08-30 DIAGNOSIS — M25671 Stiffness of right ankle, not elsewhere classified: Secondary | ICD-10-CM | POA: Insufficient documentation

## 2016-08-30 DIAGNOSIS — R29898 Other symptoms and signs involving the musculoskeletal system: Secondary | ICD-10-CM | POA: Diagnosis not present

## 2016-08-30 DIAGNOSIS — M25512 Pain in left shoulder: Secondary | ICD-10-CM | POA: Diagnosis not present

## 2016-08-30 DIAGNOSIS — R293 Abnormal posture: Secondary | ICD-10-CM | POA: Insufficient documentation

## 2016-08-30 DIAGNOSIS — R2681 Unsteadiness on feet: Secondary | ICD-10-CM | POA: Diagnosis not present

## 2016-08-30 NOTE — Therapy (Signed)
[place], [place], [address] Phone: [phone]   Fax:  [phone]  Physical Therapy Treatment (Re-Assessment)  Patient Details  Name: Nicole Baxter MRN: [id number] Date of Birth: Nov 22, 1941 Referring Provider: Duwaine Maxin   Encounter Date: 08/30/2016      PT End of Session - 08/30/16 1229    Visit Number 20   Number of Visits 28   Date for PT Re-Evaluation 09/27/16   Authorization Type [employer] (G-codes done 20th visit)   Authorization Time Period 0/01/74 to 9/44/96; recert done 06/01/90; recert done 05/01/83; recert done 66/5    Authorization - Visit Number 62   Authorization - Number of Visits 30   PT Start Time 1034   PT Stop Time 1115   PT Time Calculation (min) 41 min   Activity Tolerance Patient tolerated treatment well   Behavior During Therapy [facility] for tasks assessed/performed      Past Medical History:  Diagnosis Date  . Arthritis   . GERD (gastroesophageal reflux disease)    pt. states she no longer has GERD (09/11/15)  . Hyperglycemia    isolated FBS 122, 95, 104 (Random 138)  . Hyperlipidemia    on pravastatin  . Hypertension    controlled on 2 agents  . 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 Obesity   . Parkinson's disease (tremor, stiffness, slow motion, unstable posture) ([place])   . Shortness of breath dyspnea    pt. states that she no longer has SOB )09/11/15  . Tremor     Past Surgical History:  Procedure Laterality Date  . CESAREAN SECTION    . COLONOSCOPY    . TOTAL KNEE ARTHROPLASTY Left 09/23/2015   Procedure: LEFT TOTAL KNEE ARTHROPLASTY;  Surgeon: Ninetta Lights, MD;  Location: [place];  Service: Orthopedics;  Laterality: Left;    There were no vitals filed for this visit.      Subjective Assessment - 08/30/16 1039    Subjective Patient arrives stating she is feeling good; her left arm is still hurting her and she has not been in for imaging yet.  Getting in and out of bed is OK, the exercises she was given are just going so-so though. She feels like she has improved quite a bit in general, but does voice some concerns with her walking; she rates herself as being 70/100 with walking being her biggest concern.    Pertinent History blood thinner (now currently taking per patient- will check with MD), HTN, GERD, DM, Parkinson, OA, SOB, hx of acute blood clot in January 2017; history of macular degeneration   Patient Stated Goals walking better, better mobility    Currently in Pain? Yes   Pain Score 2    Pain Location Arm   Pain Orientation Left   Pain Descriptors / Indicators Sore   Pain Type Acute pain   Pain Radiating Towards none    Pain Onset 1 to 4 weeks ago   Pain Frequency Intermittent   Aggravating Factors  moving arm    Pain Relieving Factors not sure    Effect of Pain on Daily Activities can't move arm actively with ease             [facility] PT Assessment - 08/30/16 1046      Strength   Right Hip Flexion 4-/5   Left Hip Flexion 4/5   Right Knee Extension 4+/5   Left Knee Extension 4+/5  Right Ankle Dorsiflexion 4+/5   Left Ankle Dorsiflexion 5/5     Bed Mobility   Rolling Right 4: Min assist   Rolling Right Details (indicate cue type and reason) verbal cues for sequencing    Rolling Left 4: Min assist   Rolling Left Details (indicate cue type and reason) verbal cues for sequencing    Sit to Supine 4: Min guard  to R side, Min(A) to L side      Transfers   Sit to Stand 5: Supervision   Stand to Sit 5: Supervision     6 minute walk test results    Aerobic Endurance Distance Walked 129   Endurance additional comments 3MWT      High Level Balance   High Level Balance Comments TUG 40 seconds with rollator                              PT Education - 08/30/16 1228    Education provided Yes   Education Details progress with skilled PT services, POC moving forward    Person(s) Educated  Patient   Methods Explanation   Comprehension Verbalized understanding          PT Short Term Goals - 08/30/16 1104      PT SHORT TERM GOAL #1   Title Patient to be able to complete all bed mobility with Min assist in order to demonstrate improved mobility and need for less assistance from family at home    Baseline 10/3- min guard to min(A)   Time 4   Period Weeks   Status Achieved     PT SHORT TERM GOAL #2   Title Patient to be able to complete all stand to sit/sit to stand transfers from various height surfaces with Min assist in order to demonstrate improved mobility and need for less assistance from family at home    Baseline 10/3- mod(I) to S    Time 4   Period Weeks   Status Achieved     PT SHORT TERM GOAL #3   Title Patient to demonstrate consistent improvements in gait pattern with LRAD, including minimal festination, increased step lengths/stance times, improved posture, and minimal instabilty in order to improve moblity at home and in community    Baseline 10/3- improving but deteriorates with fatigue    Time 4   Period Weeks   Status On-going     PT SHORT TERM GOAL #4   Title Patient to demonstrate improved posture at least 70% of the time  in order to improve balance and overall mechanics of mobility    Time 4   Period Weeks   Status On-going     PT SHORT TERM GOAL #5   Title Patient to experience R ankle pain no more than 2/10 in order to improve QOL and increase tolerance of weight bearing activities    Baseline 10/3- painfree usually    Time 4   Period Weeks   Status Achieved     PT SHORT TERM GOAL #6   Title Patient to be independent in correctly and consistently performing appropriate HEP, to be updated PRN    Baseline 10/3- difficulty with compliance    Time 4   Period Weeks   Status On-going           PT Long Term Goals - 08/30/16 1106      PT LONG TERM GOAL #1   Title Patient to be  able to complete all bed mobility and sit to stand/stand  to sit transfers with LRAD and Mod(I) in order to demonstrate improved mobility and overall QOL    Time 8   Period Weeks   Status On-going     PT LONG TERM GOAL #2   Title Patient to be able to ambulate for at least 20 minutes with mechanics WFL, LRAD, pain no more than 1/10 in order to demonstrate improved mobilty and overall QOL    Time 8   Period Weeks   Status On-going     PT LONG TERM GOAL #3   Title Patient to demonstrate strength 4/5 in all tested muscle groups in order to assist in improving mobiltiy, reducing pain, and improving balance    Time 8   Period Weeks   Status Partially Met     PT LONG TERM GOAL #4   Title Patient to demonstrate full R ankle ROM and experience no more than occasional R ankle pain 1/10 in order to improve tolerance to mobility and assist in improving QOL    Baseline 10/3- DNT    Time 8   Period Weeks   Status Deferred     PT LONG TERM GOAL #5   Title Patient to be participatory in regular exercise at least 20 minutes in duration, of moderate intensity, at least 4 days per week, in order to maintain functional gains and assist in managing parkinsonian symptoms    Time 8   Period Weeks   Status On-going     PT LONG TERM GOAL #6   Title Patient to report no falls within the past 4 weeks in order to demonstrate improved safety at home and within community    Baseline 10/3- no falls recently    Time 8   Period Weeks   Status Achieved               Plan - 08/30/16 1229    Clinical Impression Statement Re-assessment performed today. Patient appears to be subjectively and objectively improving with skilled PT services, and some of her improvements are qualitative, such as improved consistency with reduced level of assist for transfers and consistent elimination of pain in R Achilles area. Patient is happy with her progress so far, however is interested in continuing with skilled PT services due to ongoing difficulty with gait and bed mobility;  note that she has not been fully compliant with HEP.  At this time DPT recommends a short extension of skilled PT services, with strong focus on bed mobility, gait, and balance/safety awareness, as well as development of regular aerobic exercise program before DC to self-management.    Rehab Potential Good   PT Frequency 2x / week   PT Duration 4 weeks   PT Treatment/Interventions ADLs/Self Care Home Management;Biofeedback;Gait training;Stair training;Functional mobility training;Therapeutic activities;Therapeutic exercise;Balance training;Neuromuscular re-education;Patient/family education;Manual techniques;Passive range of motion;Energy conservation;Taping   PT Next Visit Plan continue PWR as appropriate; focus on gait, bed mobility, balance. Likely DC at end of October.    PT Home Exercise Plan 9/28: heel toe gait, LAQs, toe raises in sitting, seated hip ABD    Consulted and Agree with Plan of Care Patient      Patient will benefit from skilled therapeutic intervention in order to improve the following deficits and impairments:  Abnormal gait, Decreased endurance, Hypomobility, Impaired tone, Decreased activity tolerance, Decreased strength, Pain, Decreased balance, Decreased mobility, Difficulty walking, Decreased range of motion, Improper body mechanics, Decreased coordination, Decreased safety awareness,  Impaired flexibility, Postural dysfunction, Decreased knowledge of use of DME  Visit Diagnosis: Unsteadiness on feet - Plan: PT plan of care cert/re-cert  Difficulty in walking, not elsewhere classified - Plan: PT plan of care cert/re-cert  History of falling - Plan: PT plan of care cert/re-cert  Muscle weakness (generalized) - Plan: PT plan of care cert/re-cert  Abnormal posture - Plan: PT plan of care cert/re-cert  Pain in right ankle and joints of right foot - Plan: PT plan of care cert/re-cert  Stiffness of right ankle, not elsewhere classified - Plan: PT plan of care  cert/re-cert       G-Codes - 26-Sep-2016 1233    Functional Assessment Tool Used Based on skilled clinical assessment of gait, functional mobility, functional balance,strength, safety awareness, ROM    Functional Limitation Mobility: Walking and moving around   Mobility: Walking and Moving Around Current Status (F0277) At least 20 percent but less than 40 percent impaired, limited or restricted   Mobility: Walking and Moving Around Goal Status [phone]) At least 20 percent but less than 40 percent impaired, limited or restricted      Problem List Patient Active Problem List   Diagnosis Date Noted  . Urine discoloration 08/16/2016  . Left shoulder pain 08/16/2016  . Achilles tendinitis of right lower extremity 04/05/2016  . Hypertensive retinopathy of both eyes, grade 2 01/15/2016  . Left leg DVT ([place]) 12/18/2015  . 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 Internal hemorrhoids 03/10/2015  . History of colon polyps 12/25/2014  . Health care maintenance 02/13/2014  . Subclinical hyperthyroidism 12/25/2013  . Right renal mass 06/11/2012  . Osteoarthritis of both knees 03/15/2012  . Parkinsonism ([place]) 07/13/2010  . Diabetes mellitus type 2, diet-controlled ([place]) 05/22/2009  . ALLERGIC RHINITIS, SEASONAL 02/20/2007  . Hyperlipidemia 09/27/2006  . Essential hypertension 09/27/2006  . GERD 09/27/2006    Deniece Ree PT, DPT [facility] [address] [place], [place], [address] Phone: [phone]   Fax:  [phone]  Name: Nicole Baxter MRN: [id number] Date of Birth: 1941-05-17

## 2016-09-01 ENCOUNTER — Ambulatory Visit (HOSPITAL_COMMUNITY): Payer: PPO | Admitting: Physical Therapy

## 2016-09-01 DIAGNOSIS — R262 Difficulty in walking, not elsewhere classified: Secondary | ICD-10-CM

## 2016-09-01 DIAGNOSIS — M25571 Pain in right ankle and joints of right foot: Secondary | ICD-10-CM

## 2016-09-01 DIAGNOSIS — Z9181 History of falling: Secondary | ICD-10-CM

## 2016-09-01 DIAGNOSIS — R2681 Unsteadiness on feet: Secondary | ICD-10-CM | POA: Diagnosis not present

## 2016-09-01 DIAGNOSIS — M25671 Stiffness of right ankle, not elsewhere classified: Secondary | ICD-10-CM

## 2016-09-01 DIAGNOSIS — R293 Abnormal posture: Secondary | ICD-10-CM

## 2016-09-01 DIAGNOSIS — M6281 Muscle weakness (generalized): Secondary | ICD-10-CM

## 2016-09-01 NOTE — Therapy (Signed)
[place], [place], [address] Phone: [phone]   Fax:  [phone]  Physical Therapy Treatment  Patient Details  Name: Nicole Baxter MRN: [id number] Date of Birth: Feb 02, 1941 Referring Provider: Duwaine Maxin   Encounter Date: 09/01/2016      PT End of Session - 09/01/16 1216    Visit Number 21   Number of Visits 28   Date for PT Re-Evaluation 09/27/16   Authorization Type [employer] (G-codes done 20th visit)   Authorization Time Period 9/32/35 to 5/73/22; recert done 0/2/54; recert done 01/04/05; recert done 23/7    Authorization - Visit Number 21   Authorization - Number of Visits 30   PT Start Time 1032   PT Stop Time 1115   PT Time Calculation (min) 43 min   Activity Tolerance Patient tolerated treatment well   Behavior During Therapy [facility] for tasks assessed/performed      Past Medical History:  Diagnosis Date  . Arthritis   . GERD (gastroesophageal reflux disease)    pt. states she no longer has GERD (09/11/15)  . Hyperglycemia    isolated FBS 122, 95, 104 (Random 138)  . Hyperlipidemia    on pravastatin  . Hypertension    controlled on 2 agents  . 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 Obesity   . Parkinson's disease (tremor, stiffness, slow motion, unstable posture) ([place])   . Shortness of breath dyspnea    pt. states that she no longer has SOB )09/11/15  . Tremor     Past Surgical History:  Procedure Laterality Date  . CESAREAN SECTION    . COLONOSCOPY    . TOTAL KNEE ARTHROPLASTY Left 09/23/2015   Procedure: LEFT TOTAL KNEE ARTHROPLASTY;  Surgeon: Ninetta Lights, MD;  Location: [facility];  Service: Orthopedics;  Laterality: Left;    There were no vitals filed for this visit.      Subjective Assessment - 09/01/16 1038    Subjective Patient arrives today stating she is feeling good; no major changes since last session and her L arm is feeling OK.    Currently in  Pain? No/denies                         [facility] Adult PT Treatment/Exercise - 09/01/16 0001      Bed Mobility   Supine to Sit 5: Supervision  occasional min assist    Supine to Sit Details (indicate cue type and reason) 2x5 of progressive difficulty   Sit to Supine 5: Supervision   Sit to Supine - Details (indicate cue type and reason) 2x5 of increasing difficulty      Ambulation/Gait   Ambulation/Gait Yes   Ambulation/Gait Assistance 5: Supervision   Ambulation Distance (Feet) 86 Feet  x2   Assistive device Rollator   Gait Comments cues for posture and step length/heel toe      Knee/Hip Exercises: Standing   Other Standing Knee Exercises sideways and forwards over small hurdles, cues for technique and safety                 PT Education - 09/01/16 1215    Education provided Yes   Education Details correct supine to sit/sit to supine technique    Person(s) Educated Patient   Methods Explanation;Demonstration;Handout   Comprehension Verbalized understanding;Returned demonstration;Need further instruction          PT Short Term Goals -  08/30/16 1104      PT SHORT TERM GOAL #1   Title Patient to be able to complete all bed mobility with Min assist in order to demonstrate improved mobility and need for less assistance from family at home    Baseline 10/3- min guard to min(A)   Time 4   Period Weeks   Status Achieved     PT SHORT TERM GOAL #2   Title Patient to be able to complete all stand to sit/sit to stand transfers from various height surfaces with Min assist in order to demonstrate improved mobility and need for less assistance from family at home    Baseline 10/3- mod(I) to S    Time 4   Period Weeks   Status Achieved     PT SHORT TERM GOAL #3   Title Patient to demonstrate consistent improvements in gait pattern with LRAD, including minimal festination, increased step lengths/stance times, improved posture, and minimal instabilty in order  to improve moblity at home and in community    Baseline 10/3- improving but deteriorates with fatigue    Time 4   Period Weeks   Status On-going     PT SHORT TERM GOAL #4   Title Patient to demonstrate improved posture at least 70% of the time  in order to improve balance and overall mechanics of mobility    Time 4   Period Weeks   Status On-going     PT SHORT TERM GOAL #5   Title Patient to experience R ankle pain no more than 2/10 in order to improve QOL and increase tolerance of weight bearing activities    Baseline 10/3- painfree usually    Time 4   Period Weeks   Status Achieved     PT SHORT TERM GOAL #6   Title Patient to be independent in correctly and consistently performing appropriate HEP, to be updated PRN    Baseline 10/3- difficulty with compliance    Time 4   Period Weeks   Status On-going           PT Long Term Goals - 08/30/16 1106      PT LONG TERM GOAL #1   Title Patient to be able to complete all bed mobility and sit to stand/stand to sit transfers with LRAD and Mod(I) in order to demonstrate improved mobility and overall QOL    Time 8   Period Weeks   Status On-going     PT LONG TERM GOAL #2   Title Patient to be able to ambulate for at least 20 minutes with mechanics WFL, LRAD, pain no more than 1/10 in order to demonstrate improved mobilty and overall QOL    Time 8   Period Weeks   Status On-going     PT LONG TERM GOAL #3   Title Patient to demonstrate strength 4/5 in all tested muscle groups in order to assist in improving mobiltiy, reducing pain, and improving balance    Time 8   Period Weeks   Status Partially Met     PT LONG TERM GOAL #4   Title Patient to demonstrate full R ankle ROM and experience no more than occasional R ankle pain 1/10 in order to improve tolerance to mobility and assist in improving QOL    Baseline 10/3- DNT    Time 8   Period Weeks   Status Deferred     PT LONG TERM GOAL #5   Title Patient to be participatory  in  regular exercise at least 20 minutes in duration, of moderate intensity, at least 4 days per week, in order to maintain functional gains and assist in managing parkinsonian symptoms    Time 8   Period Weeks   Status On-going     PT LONG TERM GOAL #6   Title Patient to report no falls within the past 4 weeks in order to demonstrate improved safety at home and within community    Baseline 10/3- no falls recently    Time 8   Period Weeks   Status Achieved               Plan - 09/01/16 1216    Clinical Impression Statement Focused today's session initially on bed mobility and sit to supine/supine to sit transfers with supervision/occasional min guard with patient able to perform correctly on her own at end of practice; otherwise focused on gait training and exercises stepping over hurdles also to assist with gait mechanics today. Patient continues to require cues for proper gait and obstacle clearance strategies throughout session.    Rehab Potential Good   PT Frequency 2x / week   PT Duration 4 weeks   PT Treatment/Interventions ADLs/Self Care Home Management;Biofeedback;Gait training;Stair training;Functional mobility training;Therapeutic activities;Therapeutic exercise;Balance training;Neuromuscular re-education;Patient/family education;Manual techniques;Passive range of motion;Energy conservation;Taping   PT Next Visit Plan continue PWR as appropriate; focus on gait, bed mobility, balance. Likely DC at end of October.    PT Home Exercise Plan 9/28: heel toe gait, LAQs, toe raises in sitting, seated hip ABD    Consulted and Agree with Plan of Care Patient      Patient will benefit from skilled therapeutic intervention in order to improve the following deficits and impairments:  Abnormal gait, Decreased endurance, Hypomobility, Impaired tone, Decreased activity tolerance, Decreased strength, Pain, Decreased balance, Decreased mobility, Difficulty walking, Decreased range of motion,  Improper body mechanics, Decreased coordination, Decreased safety awareness, Impaired flexibility, Postural dysfunction, Decreased knowledge of use of DME  Visit Diagnosis: Unsteadiness on feet  Difficulty in walking, not elsewhere classified  History of falling  Muscle weakness (generalized)  Abnormal posture  Pain in right ankle and joints of right foot  Stiffness of right ankle, not elsewhere classified     Problem List Patient Active Problem List   Diagnosis Date Noted  . Urine discoloration 08/16/2016  . Left shoulder pain 08/16/2016  . Achilles tendinitis of right lower extremity 04/05/2016  . Hypertensive retinopathy of both eyes, grade 2 01/15/2016  . Left leg DVT ([place]) 12/18/2015  . 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 Internal hemorrhoids 03/10/2015  . History of colon polyps 12/25/2014  . Health care maintenance 02/13/2014  . Subclinical hyperthyroidism 12/25/2013  . Right renal mass 06/11/2012  . Osteoarthritis of both knees 03/15/2012  . Parkinsonism ([place]) 07/13/2010  . Diabetes mellitus type 2, diet-controlled ([place]) 05/22/2009  . ALLERGIC RHINITIS, SEASONAL 02/20/2007  . Hyperlipidemia 09/27/2006  . Essential hypertension 09/27/2006  . GERD 09/27/2006    Deniece Ree PT, DPT [facility] [address] [place], [place], [address] Phone: [phone]   Fax:  [phone]  Name: Nicole Baxter MRN: [id number] Date of Birth: 08-17-1941

## 2016-09-01 NOTE — Patient Instructions (Signed)
   [facility] - LOG ROLL  GETTING IN BED: Start by sitting on the edge of the bed. Next, lower your self down lying on your side using your arms. Once fully on your side, roll onto your back.  When rolling be sure y our knees stay bent and that you roll your whole body together as one unit. Your shoulders, pelvis and knees all roll as one.    GETTING OUT OF BED: Start by bending your knees and then roll onto your side. Reach your arm across your body to initiate the rolling. When rolling, be sure that you roll your whole body together as one unit. Your shoulders, pelvis and knees should all roll together. Once on our side, tip yourself up to sitting using your arms.     PRACTICE THIS WHEN YOU ARE GETTING IN AND OUT OF BED EVERY DAY!!!

## 2016-09-06 ENCOUNTER — Ambulatory Visit (HOSPITAL_COMMUNITY): Payer: PPO | Admitting: Physical Therapy

## 2016-09-06 DIAGNOSIS — R293 Abnormal posture: Secondary | ICD-10-CM

## 2016-09-06 DIAGNOSIS — R2681 Unsteadiness on feet: Secondary | ICD-10-CM

## 2016-09-06 DIAGNOSIS — M6281 Muscle weakness (generalized): Secondary | ICD-10-CM

## 2016-09-06 DIAGNOSIS — M25571 Pain in right ankle and joints of right foot: Secondary | ICD-10-CM

## 2016-09-06 DIAGNOSIS — R262 Difficulty in walking, not elsewhere classified: Secondary | ICD-10-CM

## 2016-09-06 DIAGNOSIS — M25671 Stiffness of right ankle, not elsewhere classified: Secondary | ICD-10-CM

## 2016-09-06 DIAGNOSIS — Z9181 History of falling: Secondary | ICD-10-CM

## 2016-09-06 NOTE — Therapy (Signed)
[place], [place], [address] Phone: [phone]   Fax:  [phone]  Physical Therapy Treatment  Patient Details  Name: Nicole Baxter MRN: [id number] Date of Birth: 21-Jan-1941 Referring Provider: Duwaine Maxin   Encounter Date: 09/06/2016      PT End of Session - 09/06/16 1107    Visit Number 22   Number of Visits 28   Date for PT Re-Evaluation 09/27/16   Authorization Type [employer] (G-codes done 20th visit)   Authorization Time Period 8/46/65 to 9/93/57; recert done 0/1/77; recert done 07/31/89; recert done 30/0    Authorization - Visit Number 83   Authorization - Number of Visits 30   PT Start Time 1021   PT Stop Time 1105   PT Time Calculation (min) 44 min   Activity Tolerance Patient tolerated treatment well;Patient limited by fatigue   Behavior During Therapy [facility] for tasks assessed/performed      Past Medical History:  Diagnosis Date  . Arthritis   . GERD (gastroesophageal reflux disease)    pt. states she no longer has GERD (09/11/15)  . Hyperglycemia    isolated FBS 122, 95, 104 (Random 138)  . Hyperlipidemia    on pravastatin  . Hypertension    controlled on 2 agents  . 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 Obesity   . Parkinson's disease (tremor, stiffness, slow motion, unstable posture) ([place])   . Shortness of breath dyspnea    pt. states that she no longer has SOB )09/11/15  . Tremor     Past Surgical History:  Procedure Laterality Date  . CESAREAN SECTION    . COLONOSCOPY    . TOTAL KNEE ARTHROPLASTY Left 09/23/2015   Procedure: LEFT TOTAL KNEE ARTHROPLASTY;  Surgeon: Ninetta Lights, MD;  Location: [facility];  Service: Orthopedics;  Laterality: Left;    There were no vitals filed for this visit.      Subjective Assessment - 09/06/16 1030    Subjective Patient arrives stating she feels OK; she reports she was very busy this weekend with a program at church.  She does report a fall this weekend when she was trying to walk without her walker but she cannot remember which part hit the ground first. She is not having any pain from that fall. She states that she tried to practice her bed mobility exercises this weekend but it just didn't work so well because she had trouble moving. Nothing else major going on.    Patient is accompained by: Family member   Pertinent History blood thinner (now currently taking per patient- will check with MD), HTN, GERD, DM, Parkinson, OA, SOB, hx of acute blood clot in January 2017; history of macular degeneration   Patient Stated Goals walking better, better mobility    Currently in Pain? No/denies                         [facility] Adult PT Treatment/Exercise - 09/06/16 0001      Ambulation/Gait   Gait Comments cues for posture and step length/heel toe      Therapeutic Activites    Other Therapeutic Activities focus on bed mobility activities today- sitting to elbow, sit/supine to supiine/sit, introduced rolling                 PT Education - 09/06/16 1107    Education provided Yes   Education Details importance  of rest in reducing fatigue/improving functional performance    Person(s) Educated Patient   Methods Explanation   Comprehension Verbalized understanding          PT Short Term Goals - 08/30/16 1104      PT SHORT TERM GOAL #1   Title Patient to be able to complete all bed mobility with Min assist in order to demonstrate improved mobility and need for less assistance from family at home    Baseline 10/3- min guard to min(A)   Time 4   Period Weeks   Status Achieved     PT SHORT TERM GOAL #2   Title Patient to be able to complete all stand to sit/sit to stand transfers from various height surfaces with Min assist in order to demonstrate improved mobility and need for less assistance from family at home    Baseline 10/3- mod(I) to S    Time 4   Period Weeks   Status Achieved      PT SHORT TERM GOAL #3   Title Patient to demonstrate consistent improvements in gait pattern with LRAD, including minimal festination, increased step lengths/stance times, improved posture, and minimal instabilty in order to improve moblity at home and in community    Baseline 10/3- improving but deteriorates with fatigue    Time 4   Period Weeks   Status On-going     PT SHORT TERM GOAL #4   Title Patient to demonstrate improved posture at least 70% of the time  in order to improve balance and overall mechanics of mobility    Time 4   Period Weeks   Status On-going     PT SHORT TERM GOAL #5   Title Patient to experience R ankle pain no more than 2/10 in order to improve QOL and increase tolerance of weight bearing activities    Baseline 10/3- painfree usually    Time 4   Period Weeks   Status Achieved     PT SHORT TERM GOAL #6   Title Patient to be independent in correctly and consistently performing appropriate HEP, to be updated PRN    Baseline 10/3- difficulty with compliance    Time 4   Period Weeks   Status On-going           PT Long Term Goals - 08/30/16 1106      PT LONG TERM GOAL #1   Title Patient to be able to complete all bed mobility and sit to stand/stand to sit transfers with LRAD and Mod(I) in order to demonstrate improved mobility and overall QOL    Time 8   Period Weeks   Status On-going     PT LONG TERM GOAL #2   Title Patient to be able to ambulate for at least 20 minutes with mechanics WFL, LRAD, pain no more than 1/10 in order to demonstrate improved mobilty and overall QOL    Time 8   Period Weeks   Status On-going     PT LONG TERM GOAL #3   Title Patient to demonstrate strength 4/5 in all tested muscle groups in order to assist in improving mobiltiy, reducing pain, and improving balance    Time 8   Period Weeks   Status Partially Met     PT LONG TERM GOAL #4   Title Patient to demonstrate full R ankle ROM and experience no more than  occasional R ankle pain 1/10 in order to improve tolerance to mobility and assist in improving QOL  Baseline 10/3- DNT    Time 8   Period Weeks   Status Deferred     PT LONG TERM GOAL #5   Title Patient to be participatory in regular exercise at least 20 minutes in duration, of moderate intensity, at least 4 days per week, in order to maintain functional gains and assist in managing parkinsonian symptoms    Time 8   Period Weeks   Status On-going     PT LONG TERM GOAL #6   Title Patient to report no falls within the past 4 weeks in order to demonstrate improved safety at home and within community    Baseline 10/3- no falls recently    Time 8   Period Weeks   Status Achieved               Plan - 09/06/16 1107    Clinical Impression Statement Patient presents as being more fatigued today, states she had a very busy weekend and she really didn't get a lot of sleep; patient very tired today, regularly falling asleep momentarily in sitting posture during session. Focused today on bed mobility activities with min guard to Min(A) required for all today, including rolling, however heavy verbal cues were provided throughout session. Encouraged patient to get quality rest over the next couple of days, educated regarding effect of fatigue on physical performance.    Rehab Potential Good   PT Frequency 2x / week   PT Duration 4 weeks   PT Treatment/Interventions ADLs/Self Care Home Management;Biofeedback;Gait training;Stair training;Functional mobility training;Therapeutic activities;Therapeutic exercise;Balance training;Neuromuscular re-education;Patient/family education;Manual techniques;Passive range of motion;Energy conservation;Taping   PT Next Visit Plan continue PWR as appropriate; focus on gait, bed mobility, balance. Likely DC at end of October.    PT Home Exercise Plan 10/10: heel toe gait, LAQs, toe raises in sitting, seated hip ABD, bed mobility    Consulted and Agree with Plan  of Care Patient      Patient will benefit from skilled therapeutic intervention in order to improve the following deficits and impairments:  Abnormal gait, Decreased endurance, Hypomobility, Impaired tone, Decreased activity tolerance, Decreased strength, Pain, Decreased balance, Decreased mobility, Difficulty walking, Decreased range of motion, Improper body mechanics, Decreased coordination, Decreased safety awareness, Impaired flexibility, Postural dysfunction, Decreased knowledge of use of DME  Visit Diagnosis: Unsteadiness on feet  Difficulty in walking, not elsewhere classified  History of falling  Muscle weakness (generalized)  Abnormal posture  Pain in right ankle and joints of right foot  Stiffness of right ankle, not elsewhere classified     Problem List Patient Active Problem List   Diagnosis Date Noted  . Urine discoloration 08/16/2016  . Left shoulder pain 08/16/2016  . Achilles tendinitis of right lower extremity 04/05/2016  . Hypertensive retinopathy of both eyes, grade 2 01/15/2016  . Left leg DVT ([place]) 12/18/2015  . 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 Internal hemorrhoids 03/10/2015  . History of colon polyps 12/25/2014  . Health care maintenance 02/13/2014  . Subclinical hyperthyroidism 12/25/2013  . Right renal mass 06/11/2012  . Osteoarthritis of both knees 03/15/2012  . Parkinsonism ([place]) 07/13/2010  . Diabetes mellitus type 2, diet-controlled ([place]) 05/22/2009  . ALLERGIC RHINITIS, SEASONAL 02/20/2007  . Hyperlipidemia 09/27/2006  . Essential hypertension 09/27/2006  . GERD 09/27/2006    Deniece Ree PT, DPT [facility] [address] [place], [place], [address] Phone: [phone]   Fax:  [phone]  Name: Nicole Baxter MRN:  [id number] Date of Birth: 11/26/1941

## 2016-09-07 ENCOUNTER — Telehealth: Payer: Self-pay | Admitting: *Deleted

## 2016-09-07 ENCOUNTER — Ambulatory Visit: Payer: PPO | Admitting: Neurology

## 2016-09-07 ENCOUNTER — Other Ambulatory Visit: Payer: Self-pay | Admitting: Neurology

## 2016-09-07 NOTE — Telephone Encounter (Signed)
Dr Lucia GaskinsAhern- pt showed up late for appt today and had to r/s to November. Looks like the medications you prescribed her she should have enough until 01/2017. Can you check behind me?

## 2016-09-08 ENCOUNTER — Ambulatory Visit (HOSPITAL_COMMUNITY): Payer: PPO | Admitting: Physical Therapy

## 2016-09-08 DIAGNOSIS — R2681 Unsteadiness on feet: Secondary | ICD-10-CM

## 2016-09-08 DIAGNOSIS — Z9181 History of falling: Secondary | ICD-10-CM

## 2016-09-08 DIAGNOSIS — M25571 Pain in right ankle and joints of right foot: Secondary | ICD-10-CM

## 2016-09-08 DIAGNOSIS — M25671 Stiffness of right ankle, not elsewhere classified: Secondary | ICD-10-CM

## 2016-09-08 DIAGNOSIS — M6281 Muscle weakness (generalized): Secondary | ICD-10-CM

## 2016-09-08 DIAGNOSIS — R262 Difficulty in walking, not elsewhere classified: Secondary | ICD-10-CM

## 2016-09-08 DIAGNOSIS — R293 Abnormal posture: Secondary | ICD-10-CM

## 2016-09-08 NOTE — Therapy (Signed)
[place], [place], [address] Phone: [phone]   Fax:  [phone]  Physical Therapy Treatment  Patient Details  Name: Nicole Baxter MRN: [id number] Date of Birth: [birth year] Referring Provider: Duwaine Maxin   Encounter Date: 09/08/2016      PT End of Session - 09/08/16 1128    Visit Number 23   Number of Visits 28   Date for PT Re-Evaluation 09/27/16   Authorization Type [employer] (G-codes done 20th visit)   Authorization Time Period 9/31/12 to 1/62/44; recert done 05/06/49; recert done 05/29/24; recert done 75/0    Authorization - Visit Number 23   Authorization - Number of Visits 30   PT Start Time 1037   PT Stop Time 1115   PT Time Calculation (min) 38 min   Activity Tolerance Patient tolerated treatment well   Behavior During Therapy [facility] for tasks assessed/performed      Past Medical History:  Diagnosis Date  . Arthritis   . GERD (gastroesophageal reflux disease)    pt. states she no longer has GERD (09/11/15)  . Hyperglycemia    isolated FBS 122, 95, 104 (Random 138)  . Hyperlipidemia    on pravastatin  . Hypertension    controlled on 2 agents  . 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 Obesity   . Parkinson's disease (tremor, stiffness, slow motion, unstable posture) ([place])   . Shortness of breath dyspnea    pt. states that she no longer has SOB )09/11/15  . Tremor     Past Surgical History:  Procedure Laterality Date  . CESAREAN SECTION    . COLONOSCOPY    . TOTAL KNEE ARTHROPLASTY Left 09/23/2015   Procedure: LEFT TOTAL KNEE ARTHROPLASTY;  Surgeon: Ninetta Lights, MD;  Location: [facility];  Service: Orthopedics;  Laterality: Left;    There were no vitals filed for this visit.      Subjective Assessment - 09/08/16 1043    Subjective Patient arrives today stating she is feeling much better today, she got some good rest over the past couple of days. Nothing else  major going on, no other falls that occurred.    Pertinent History blood thinner (now currently taking per patient- will check with MD), HTN, GERD, DM, Parkinson, OA, SOB, hx of acute blood clot in January 2017; history of macular degeneration   Patient Stated Goals walking better, better mobility    Currently in Pain? Yes   Pain Score 2    Pain Location Buttocks   Pain Orientation Right;Left   Pain Descriptors / Indicators Discomfort   Pain Type Chronic pain   Pain Radiating Towards none    Pain Onset More than a month ago   Pain Frequency Occasional   Aggravating Factors  nothing    Pain Relieving Factors voltaren gel/pain medicine    Effect of Pain on Daily Activities none                          [facility] Adult PT Treatment/Exercise - 09/08/16 0001      Therapeutic Activites    Other Therapeutic Activities focus on functional rolling and supine to sit strategies                 PT Education - 09/08/16 1128    Education provided Yes   Education Details technique for supine to sit/sit to supine, rolling  Person(s) Educated Patient   Methods Explanation;Demonstration;Verbal cues;Tactile cues   Comprehension Verbalized understanding;Need further instruction;Tactile cues required;Verbal cues required          PT Short Term Goals - 08/30/16 1104      PT SHORT TERM GOAL #1   Title Patient to be able to complete all bed mobility with Min assist in order to demonstrate improved mobility and need for less assistance from family at home    Baseline 10/3- min guard to min(A)   Time 4   Period Weeks   Status Achieved     PT SHORT TERM GOAL #2   Title Patient to be able to complete all stand to sit/sit to stand transfers from various height surfaces with Min assist in order to demonstrate improved mobility and need for less assistance from family at home    Baseline 10/3- mod(I) to S    Time 4   Period Weeks   Status Achieved     PT SHORT TERM GOAL #3    Title Patient to demonstrate consistent improvements in gait pattern with LRAD, including minimal festination, increased step lengths/stance times, improved posture, and minimal instabilty in order to improve moblity at home and in community    Baseline 10/3- improving but deteriorates with fatigue    Time 4   Period Weeks   Status On-going     PT SHORT TERM GOAL #4   Title Patient to demonstrate improved posture at least 70% of the time  in order to improve balance and overall mechanics of mobility    Time 4   Period Weeks   Status On-going     PT SHORT TERM GOAL #5   Title Patient to experience R ankle pain no more than 2/10 in order to improve QOL and increase tolerance of weight bearing activities    Baseline 10/3- painfree usually    Time 4   Period Weeks   Status Achieved     PT SHORT TERM GOAL #6   Title Patient to be independent in correctly and consistently performing appropriate HEP, to be updated PRN    Baseline 10/3- difficulty with compliance    Time 4   Period Weeks   Status On-going           PT Long Term Goals - 08/30/16 1106      PT LONG TERM GOAL #1   Title Patient to be able to complete all bed mobility and sit to stand/stand to sit transfers with LRAD and Mod(I) in order to demonstrate improved mobility and overall QOL    Time 8   Period Weeks   Status On-going     PT LONG TERM GOAL #2   Title Patient to be able to ambulate for at least 20 minutes with mechanics WFL, LRAD, pain no more than 1/10 in order to demonstrate improved mobilty and overall QOL    Time 8   Period Weeks   Status On-going     PT LONG TERM GOAL #3   Title Patient to demonstrate strength 4/5 in all tested muscle groups in order to assist in improving mobiltiy, reducing pain, and improving balance    Time 8   Period Weeks   Status Partially Met     PT LONG TERM GOAL #4   Title Patient to demonstrate full R ankle ROM and experience no more than occasional R ankle pain 1/10  in order to improve tolerance to mobility and assist in improving QOL  Baseline 10/3- DNT    Time 8   Period Weeks   Status Deferred     PT LONG TERM GOAL #5   Title Patient to be participatory in regular exercise at least 20 minutes in duration, of moderate intensity, at least 4 days per week, in order to maintain functional gains and assist in managing parkinsonian symptoms    Time 8   Period Weeks   Status On-going     PT LONG TERM GOAL #6   Title Patient to report no falls within the past 4 weeks in order to demonstrate improved safety at home and within community    Baseline 10/3- no falls recently    Time 8   Period Weeks   Status Achieved               Plan - 09/08/16 1128    Clinical Impression Statement Continued work and focus on functional bed mobility skills with emphasis on rolling this session; patient continues to demonstrate significant difficulty with rolling tasks and requires min-mod assist as well as mod-max verbal cues to perform task correctly. Also min assist for supine to sit/sit to supine transfers today. Recommend re-introduction of PWR based exercises in supine next session to address significant axial/trunk stiffness that appears to be primarily limiting mobility    Rehab Potential Good   PT Frequency 2x / week   PT Duration 4 weeks   PT Treatment/Interventions ADLs/Self Care Home Management;Biofeedback;Gait training;Stair training;Functional mobility training;Therapeutic activities;Therapeutic exercise;Balance training;Neuromuscular re-education;Patient/family education;Manual techniques;Passive range of motion;Energy conservation;Taping   PT Next Visit Plan introduce supine PWR exercises prior to ongoing work with bed mobility. LIkely DC mid-late October. Assess interest in OT for L UE.    Consulted and Agree with Plan of Care Patient   Family Member Consulted spouse       Patient will benefit from skilled therapeutic intervention in order to  improve the following deficits and impairments:  Abnormal gait, Decreased endurance, Hypomobility, Impaired tone, Decreased activity tolerance, Decreased strength, Pain, Decreased balance, Decreased mobility, Difficulty walking, Decreased range of motion, Improper body mechanics, Decreased coordination, Decreased safety awareness, Impaired flexibility, Postural dysfunction, Decreased knowledge of use of DME  Visit Diagnosis: Unsteadiness on feet  Difficulty in walking, not elsewhere classified  History of falling  Muscle weakness (generalized)  Abnormal posture  Pain in right ankle and joints of right foot  Stiffness of right ankle, not elsewhere classified     Problem List Patient Active Problem List   Diagnosis Date Noted  . Urine discoloration 08/16/2016  . Left shoulder pain 08/16/2016  . Achilles tendinitis of right lower extremity 04/05/2016  . Hypertensive retinopathy of both eyes, grade 2 01/15/2016  . Left leg DVT ([place]) 12/18/2015  . 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 Internal hemorrhoids 03/10/2015  . History of colon polyps 12/25/2014  . Health care maintenance 02/13/2014  . Subclinical hyperthyroidism 12/25/2013  . Right renal mass 06/11/2012  . Osteoarthritis of both knees 03/15/2012  . Parkinsonism ([place]) 07/13/2010  . Diabetes mellitus type 2, diet-controlled ([place]) 05/22/2009  . ALLERGIC RHINITIS, SEASONAL 02/20/2007  . Hyperlipidemia 09/27/2006  . Essential hypertension 09/27/2006  . GERD 09/27/2006    Deniece Ree PT, DPT [facility] [address] [place], [place], [address] Phone: [phone]   Fax:  [phone]  Name: ROBERTINE KIPPER MRN: [id number] Date of Birth: May 14, 1941

## 2016-09-09 ENCOUNTER — Ambulatory Visit
Admission: RE | Admit: 2016-09-09 | Discharge: 2016-09-09 | Disposition: A | Discharge status: Home / Self Care | Payer: PPO | Source: Ambulatory Visit | Attending: Internal Medicine | Admitting: Internal Medicine

## 2016-09-09 DIAGNOSIS — Z1231 Encounter for screening mammogram for malignant neoplasm of breast: Secondary | ICD-10-CM | POA: Diagnosis not present

## 2016-09-12 ENCOUNTER — Ambulatory Visit: Payer: PPO | Admitting: Neurology

## 2016-09-12 ENCOUNTER — Telehealth: Payer: Self-pay

## 2016-09-13 ENCOUNTER — Ambulatory Visit (HOSPITAL_COMMUNITY): Payer: PPO | Admitting: Physical Therapy

## 2016-09-13 DIAGNOSIS — R293 Abnormal posture: Secondary | ICD-10-CM

## 2016-09-13 DIAGNOSIS — M25571 Pain in right ankle and joints of right foot: Secondary | ICD-10-CM

## 2016-09-13 DIAGNOSIS — M6281 Muscle weakness (generalized): Secondary | ICD-10-CM

## 2016-09-13 DIAGNOSIS — M25671 Stiffness of right ankle, not elsewhere classified: Secondary | ICD-10-CM

## 2016-09-13 DIAGNOSIS — R262 Difficulty in walking, not elsewhere classified: Secondary | ICD-10-CM

## 2016-09-13 DIAGNOSIS — R2681 Unsteadiness on feet: Secondary | ICD-10-CM | POA: Diagnosis not present

## 2016-09-13 DIAGNOSIS — Z9181 History of falling: Secondary | ICD-10-CM

## 2016-09-13 NOTE — Therapy (Signed)
[place]ole Baxter, [place], [address] Phone: [phone]   Fax:  [phone]  Physical Therapy Treatment  Patient Details  Name: Nicole Baxter MRN: [id number] Date of Birth: [birth year] Referring Provider: Duwaine Maxin   Encounter Date: 09/13/2016      PT End of Session - 09/13/16 1212    Visit Number 24   Number of Visits 28   Date for PT Re-Evaluation 09/27/16   Authorization Type [employer] (G-codes done 20th visit)   Authorization Time Period 01/21/81 to 5/00/37; recert done 0/4/88; recert done 07/07/15; recert done 94/5    Authorization - Visit Number 24   Authorization - Number of Visits 30   PT Start Time 1031   PT Stop Time 1113   PT Time Calculation (min) 42 min   Activity Tolerance Patient tolerated treatment well   Behavior During Therapy [facility] for tasks assessed/performed      Past Medical History:  Diagnosis Date  . Arthritis   . GERD (gastroesophageal reflux disease)    pt. states she no longer has GERD (09/11/15)  . Hyperglycemia    isolated FBS 122, 95, 104 (Random 138)  . Hyperlipidemia    on pravastatin  . Hypertension    controlled on 2 agents  . 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 Obesity   . Parkinson's disease (tremor, stiffness, slow motion, unstable posture) ([place])   . Shortness of breath dyspnea    pt. states that she no longer has SOB )09/11/15  . Tremor     Past Surgical History:  Procedure Laterality Date  . CESAREAN SECTION    . COLONOSCOPY    . TOTAL KNEE ARTHROPLASTY Left 09/23/2015   Procedure: LEFT TOTAL KNEE ARTHROPLASTY;  Surgeon: Ninetta Lights, MD;  Location: [facility];  Service: Orthopedics;  Laterality: Left;    There were no vitals filed for this visit.      Subjective Assessment - 09/13/16 1036    Subjective (P)  Patient arrives today with her husband, they state no major changes over the weekend; no falls recently. Nothingmajor going on;  her husband states bed mobility is getting better.    Pertinent History (P)  blood thinner (now currently taking per patient- will check with MD), HTN, GERD, DM, Parkinson, OA, SOB, hx of acute blood clot in January 2017; history of macular degeneration   Patient Stated Goals (P)  walking better, better mobility    Currently in Pain? (P)  No/denies                         [facility] Adult PT Treatment/Exercise - 09/13/16 0001      Bed Mobility   Rolling Right 4: Min guard   Rolling Right Details (indicate cue type and reason) cues for form   Rolling Left 4: Min guard   Rolling Left Details (indicate cue type and reason) cues for form    Supine to Sit 4: Min guard   Supine to Sit Details (indicate cue type and reason) cues for form    Sit to Supine 5: Supervision           PWR [facility]) - 09/13/16 1211    PWR! Twist 1x6 each side supine   PWR! Up 1x6, ball and cone seated    PWR! Rock 1x6, cones seated   PWR! Twist 1x6, cones seated   PWR Step 1x3, hurdles and cones seated  PT Education - 09/13/16 1212    Education provided No          PT Short Term Goals - 08/30/16 1104      PT SHORT TERM GOAL #1   Title Patient to be able to complete all bed mobility with Min assist in order to demonstrate improved mobility and need for less assistance from family at home    Baseline 10/3- min guard to min(A)   Time 4   Period Weeks   Status Achieved     PT SHORT TERM GOAL #2   Title Patient to be able to complete all stand to sit/sit to stand transfers from various height surfaces with Min assist in order to demonstrate improved mobility and need for less assistance from family at home    Baseline 10/3- mod(I) to S    Time 4   Period Weeks   Status Achieved     PT SHORT TERM GOAL #3   Title Patient to demonstrate consistent improvements in gait pattern with LRAD, including minimal festination, increased step lengths/stance times, improved posture, and  minimal instabilty in order to improve moblity at home and in community    Baseline 10/3- improving but deteriorates with fatigue    Time 4   Period Weeks   Status On-going     PT SHORT TERM GOAL #4   Title Patient to demonstrate improved posture at least 70% of the time  in order to improve balance and overall mechanics of mobility    Time 4   Period Weeks   Status On-going     PT SHORT TERM GOAL #5   Title Patient to experience R ankle pain no more than 2/10 in order to improve QOL and increase tolerance of weight bearing activities    Baseline 10/3- painfree usually    Time 4   Period Weeks   Status Achieved     PT SHORT TERM GOAL #6   Title Patient to be independent in correctly and consistently performing appropriate HEP, to be updated PRN    Baseline 10/3- difficulty with compliance    Time 4   Period Weeks   Status On-going           PT Long Term Goals - 08/30/16 1106      PT LONG TERM GOAL #1   Title Patient to be able to complete all bed mobility and sit to stand/stand to sit transfers with LRAD and Mod(I) in order to demonstrate improved mobility and overall QOL    Time 8   Period Weeks   Status On-going     PT LONG TERM GOAL #2   Title Patient to be able to ambulate for at least 20 minutes with mechanics WFL, LRAD, pain no more than 1/10 in order to demonstrate improved mobilty and overall QOL    Time 8   Period Weeks   Status On-going     PT LONG TERM GOAL #3   Title Patient to demonstrate strength 4/5 in all tested muscle groups in order to assist in improving mobiltiy, reducing pain, and improving balance    Time 8   Period Weeks   Status Partially Met     PT LONG TERM GOAL #4   Title Patient to demonstrate full R ankle ROM and experience no more than occasional R ankle pain 1/10 in order to improve tolerance to mobility and assist in improving QOL    Baseline 10/3- DNT    Time 8  Period Weeks   Status Deferred     PT LONG TERM GOAL #5   Title  Patient to be participatory in regular exercise at least 20 minutes in duration, of moderate intensity, at least 4 days per week, in order to maintain functional gains and assist in managing parkinsonian symptoms    Time 8   Period Weeks   Status On-going     PT LONG TERM GOAL #6   Title Patient to report no falls within the past 4 weeks in order to demonstrate improved safety at home and within community    Baseline 10/3- no falls recently    Time 8   Period Weeks   Status Achieved               Plan - 09/13/16 1212    Clinical Impression Statement Began session with functional PWR exercises in sitting to promote general trunk mobility, coordination, and flexibility, then progressed to working on bed mobility with patient requiring ongoing Min(A) for bed mobility strategies. Patient does continue to demonstrate ongoing need for verbal cues for technique as well as Min(A) however did demonstrate improved form with rolling and bed mobility in general today with repeated practice.    Rehab Potential Good   PT Frequency 2x / week   PT Duration 4 weeks   PT Treatment/Interventions ADLs/Self Care Home Management;Biofeedback;Gait training;Stair training;Functional mobility training;Therapeutic activities;Therapeutic exercise;Balance training;Neuromuscular re-education;Patient/family education;Manual techniques;Passive range of motion;Energy conservation;Taping   PT Next Visit Plan continue seated and  supine PWR exercises prior to ongoing work with bed mobility. LIkely DC mid-late October. Assess interest in OT for L UE.    PT Home Exercise Plan 10/10: heel toe gait, LAQs, toe raises in sitting, seated hip ABD, bed mobility    Consulted and Agree with Plan of Care Patient   Family Member Consulted spouse       Patient will benefit from skilled therapeutic intervention in order to improve the following deficits and impairments:  Abnormal gait, Decreased endurance, Hypomobility, Impaired  tone, Decreased activity tolerance, Decreased strength, Pain, Decreased balance, Decreased mobility, Difficulty walking, Decreased range of motion, Improper body mechanics, Decreased coordination, Decreased safety awareness, Impaired flexibility, Postural dysfunction, Decreased knowledge of use of DME  Visit Diagnosis: Unsteadiness on feet  Difficulty in walking, not elsewhere classified  History of falling  Muscle weakness (generalized)  Abnormal posture  Pain in right ankle and joints of right foot  Stiffness of right ankle, not elsewhere classified     Problem List Patient Active Problem List   Diagnosis Date Noted  . Urine discoloration 08/16/2016  . Left shoulder pain 08/16/2016  . Achilles tendinitis of right lower extremity 04/05/2016  . Hypertensive retinopathy of both eyes, grade 2 01/15/2016  . Left leg DVT ([place]) 12/18/2015  . 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 Internal hemorrhoids 03/10/2015  . History of colon polyps 12/25/2014  . Health care maintenance 02/13/2014  . Subclinical hyperthyroidism 12/25/2013  . Right renal mass 06/11/2012  . Osteoarthritis of both knees 03/15/2012  . Parkinsonism ([place]) 07/13/2010  . Diabetes mellitus type 2, diet-controlled ([place]) 05/22/2009  . ALLERGIC RHINITIS, SEASONAL 02/20/2007  . Hyperlipidemia 09/27/2006  . Essential hypertension 09/27/2006  . GERD 09/27/2006    Deniece Ree PT, DPT [facility] [address] [place], [place], [address] Phone: [phone]   Fax:  [phone]  Name: Nicole Baxter MRN: [id number] Date of Birth: [birth year]

## 2016-09-14 ENCOUNTER — Telehealth (HOSPITAL_COMMUNITY): Payer: Self-pay | Admitting: Physical Therapy

## 2016-09-14 ENCOUNTER — Ambulatory Visit (HOSPITAL_COMMUNITY): Payer: PPO | Admitting: Physical Therapy

## 2016-09-14 DIAGNOSIS — R293 Abnormal posture: Secondary | ICD-10-CM

## 2016-09-14 DIAGNOSIS — Z9181 History of falling: Secondary | ICD-10-CM

## 2016-09-14 DIAGNOSIS — M25671 Stiffness of right ankle, not elsewhere classified: Secondary | ICD-10-CM

## 2016-09-14 DIAGNOSIS — M6281 Muscle weakness (generalized): Secondary | ICD-10-CM

## 2016-09-14 DIAGNOSIS — M25571 Pain in right ankle and joints of right foot: Secondary | ICD-10-CM

## 2016-09-14 DIAGNOSIS — R262 Difficulty in walking, not elsewhere classified: Secondary | ICD-10-CM

## 2016-09-14 DIAGNOSIS — R2681 Unsteadiness on feet: Secondary | ICD-10-CM

## 2016-09-14 NOTE — Telephone Encounter (Signed)
Placed order for skilled OT services for L shoulder in outgoing fax box.  Nedra HaiKristen Analiza Cowger PT, DPT [phone]

## 2016-09-14 NOTE — Therapy (Signed)
[place], [place], [address] Phone: [phone]   Fax:  [phone]  Physical Therapy Treatment  Patient Details  Name: Nicole Baxter MRN: [id number] Date of Birth: 1941-11-05 Referring Provider: Duwaine Maxin   Encounter Date: 09/14/2016      PT End of Session - 09/14/16 0954    Visit Number 25   Number of Visits 28   Date for PT Re-Evaluation 09/27/16   Authorization Type [employer] (G-codes done 20th visit)   Authorization Time Period 5/36/46 to 06/30/20; recert done 12/30/46; recert done 01/02/99; recert done 37/0    Authorization - Visit Number 25   Authorization - Number of Visits 30   PT Start Time 0904   PT Stop Time 0943   PT Time Calculation (min) 39 min   Activity Tolerance Patient tolerated treatment well   Behavior During Therapy [facility] for tasks assessed/performed      Past Medical History:  Diagnosis Date  . Arthritis   . GERD (gastroesophageal reflux disease)    pt. states she no longer has GERD (09/11/15)  . Hyperglycemia    isolated FBS 122, 95, 104 (Random 138)  . Hyperlipidemia    on pravastatin  . Hypertension    controlled on 2 agents  . 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 Obesity   . Parkinson's disease (tremor, stiffness, slow motion, unstable posture) ([place])   . Shortness of breath dyspnea    pt. states that she no longer has SOB )09/11/15  . Tremor     Past Surgical History:  Procedure Laterality Date  . CESAREAN SECTION    . COLONOSCOPY    . TOTAL KNEE ARTHROPLASTY Left 09/23/2015   Procedure: LEFT TOTAL KNEE ARTHROPLASTY;  Surgeon: Ninetta Lights, MD;  Location: [place];  Service: Orthopedics;  Laterality: Left;    There were no vitals filed for this visit.      Subjective Assessment - 09/14/16 0910    Subjective Paitent arrives today stating that she is feeling OK today; she is feeling tired and her knee is hurting. Nothing else major going on.     Patient is accompained by: Family member   Pertinent History blood thinner (now currently taking per patient- will check with MD), HTN, GERD, DM, Parkinson, OA, SOB, hx of acute blood clot in January 2017; history of macular degeneration   Patient Stated Goals walking better, better mobility    Currently in Pain? Yes   Pain Score 1    Pain Location Knee   Pain Orientation Right   Pain Descriptors / Indicators Discomfort   Pain Type Chronic pain   Pain Radiating Towards none    Pain Onset More than a month ago   Pain Frequency Occasional   Aggravating Factors  not sure    Pain Relieving Factors pain medicine and exercise    Effect of Pain on Daily Activities without pain medicine, it can be hard to walk                          [facility] Adult PT Treatment/Exercise - 09/14/16 0001      Ambulation/Gait   Gait Comments gait approximately 267f with rollator, cues for heel-toe gait pattern and step/stride length; gait training in and out of clinic            PWR ([facility] - 09/14/16 0915    PWR! Up --  PWR! Up 1x6, ball and cone    PWR! Rock 1x6, L UE limtied by pain    PWR! Twist 1x6, L UE limited by pain    PWR! Step 1x3, hurdles    Comments in sitting              PT Education - 09/14/16 0954    Education provided Yes   Education Details focus during remaining sessions, reassess and likely DC at end of October    Person(s) Educated Patient;Spouse   Methods Explanation   Comprehension Verbalized understanding          PT Short Term Goals - 08/30/16 1104      PT SHORT TERM GOAL #1   Title Patient to be able to complete all bed mobility with Min assist in order to demonstrate improved mobility and need for less assistance from family at home    Baseline 10/3- min guard to min(A)   Time 4   Period Weeks   Status Achieved     PT SHORT TERM GOAL #2   Title Patient to be able to complete all stand to sit/sit to stand transfers from various height  surfaces with Min assist in order to demonstrate improved mobility and need for less assistance from family at home    Baseline 10/3- mod(I) to S    Time 4   Period Weeks   Status Achieved     PT SHORT TERM GOAL #3   Title Patient to demonstrate consistent improvements in gait pattern with LRAD, including minimal festination, increased step lengths/stance times, improved posture, and minimal instabilty in order to improve moblity at home and in community    Baseline 10/3- improving but deteriorates with fatigue    Time 4   Period Weeks   Status On-going     PT SHORT TERM GOAL #4   Title Patient to demonstrate improved posture at least 70% of the time  in order to improve balance and overall mechanics of mobility    Time 4   Period Weeks   Status On-going     PT SHORT TERM GOAL #5   Title Patient to experience R ankle pain no more than 2/10 in order to improve QOL and increase tolerance of weight bearing activities    Baseline 10/3- painfree usually    Time 4   Period Weeks   Status Achieved     PT SHORT TERM GOAL #6   Title Patient to be independent in correctly and consistently performing appropriate HEP, to be updated PRN    Baseline 10/3- difficulty with compliance    Time 4   Period Weeks   Status On-going           PT Long Term Goals - 08/30/16 1106      PT LONG TERM GOAL #1   Title Patient to be able to complete all bed mobility and sit to stand/stand to sit transfers with LRAD and Mod(I) in order to demonstrate improved mobility and overall QOL    Time 8   Period Weeks   Status On-going     PT LONG TERM GOAL #2   Title Patient to be able to ambulate for at least 20 minutes with mechanics WFL, LRAD, pain no more than 1/10 in order to demonstrate improved mobilty and overall QOL    Time 8   Period Weeks   Status On-going     PT LONG TERM GOAL #3   Title Patient to demonstrate strength   4/5 in all tested muscle groups in order to assist in improving mobiltiy,  reducing pain, and improving balance    Time 8   Period Weeks   Status Partially Met     PT LONG TERM GOAL #4   Title Patient to demonstrate full R ankle ROM and experience no more than occasional R ankle pain 1/10 in order to improve tolerance to mobility and assist in improving QOL    Baseline 10/3- DNT    Time 8   Period Weeks   Status Deferred     PT LONG TERM GOAL #5   Title Patient to be participatory in regular exercise at least 20 minutes in duration, of moderate intensity, at least 4 days per week, in order to maintain functional gains and assist in managing parkinsonian symptoms    Time 8   Period Weeks   Status On-going     PT LONG TERM GOAL #6   Title Patient to report no falls within the past 4 weeks in order to demonstrate improved safety at home and within community    Baseline 10/3- no falls recently    Time 8   Period Weeks   Status Achieved               Plan - 09/14/16 0955    Clinical Impression Statement Patient arrives today stating she is fatigued in general, also appears more limited by L UE pain with PWR based tasks today. Primarily performed seated PWR exercises as well as gait training with rollator today, cues for form and proper technique provided throughout all activities. Patient appears to be becoming more and more limited by L UE pain, and formal referral for OT skilled services was placed this morning. Recommend 2 more sessions to focus on bed mobility before re-assess and DC to independent program.    Rehab Potential Good   PT Frequency 2x / week   PT Duration 4 weeks   PT Treatment/Interventions ADLs/Self Care Home Management;Biofeedback;Gait training;Stair training;Functional mobility training;Therapeutic activities;Therapeutic exercise;Balance training;Neuromuscular re-education;Patient/family education;Manual techniques;Passive range of motion;Energy conservation;Taping   PT Next Visit Plan focus primarily on bed mobility; 2 more  treatments then re-assess/likely DC to independent program    Consulted and Agree with Plan of Care Patient   Family Member Consulted spouse       Patient will benefit from skilled therapeutic intervention in order to improve the following deficits and impairments:  Abnormal gait, Decreased endurance, Hypomobility, Impaired tone, Decreased activity tolerance, Decreased strength, Pain, Decreased balance, Decreased mobility, Difficulty walking, Decreased range of motion, Improper body mechanics, Decreased coordination, Decreased safety awareness, Impaired flexibility, Postural dysfunction, Decreased knowledge of use of DME  Visit Diagnosis: Unsteadiness on feet  Difficulty in walking, not elsewhere classified  History of falling  Muscle weakness (generalized)  Abnormal posture  Pain in right ankle and joints of right foot  Stiffness of right ankle, not elsewhere classified     Problem List Patient Active Problem List   Diagnosis Date Noted  . Urine discoloration 08/16/2016  . Left shoulder pain 08/16/2016  . Achilles tendinitis of right lower extremity 04/05/2016  . Hypertensive retinopathy of both eyes, grade 2 01/15/2016  . Left leg DVT (HCC) 12/18/2015  . 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 Internal hemorrhoids 03/10/2015  . History of colon polyps 12/25/2014  . Health care maintenance 02/13/2014  . Subclinical hyperthyroidism 12/25/2013  . Right renal mass 06/11/2012  . Osteoarthritis of both knees 03/15/2012  . Parkinsonism (HCC)   07/13/2010  . Diabetes mellitus type 2, diet-controlled (HCC) 05/22/2009  . ALLERGIC RHINITIS, SEASONAL 02/20/2007  . Hyperlipidemia 09/27/2006  . Essential hypertension 09/27/2006  . GERD 09/27/2006    Kristen Unger PT, DPT [phone]  [facility] [address] [place], [place], [address] Phone: [phone]   Fax:  [phone]  Name: Nicole Baxter MRN:  [id number] Date of Birth: 08/16/1941    

## 2016-09-15 ENCOUNTER — Ambulatory Visit (HOSPITAL_COMMUNITY): Payer: PPO | Admitting: Physical Therapy

## 2016-09-20 ENCOUNTER — Ambulatory Visit (HOSPITAL_COMMUNITY): Payer: PPO | Admitting: Physical Therapy

## 2016-09-20 DIAGNOSIS — R293 Abnormal posture: Secondary | ICD-10-CM

## 2016-09-20 DIAGNOSIS — R2681 Unsteadiness on feet: Secondary | ICD-10-CM

## 2016-09-20 DIAGNOSIS — M25571 Pain in right ankle and joints of right foot: Secondary | ICD-10-CM

## 2016-09-20 DIAGNOSIS — M6281 Muscle weakness (generalized): Secondary | ICD-10-CM

## 2016-09-20 DIAGNOSIS — Z9181 History of falling: Secondary | ICD-10-CM

## 2016-09-20 DIAGNOSIS — M25671 Stiffness of right ankle, not elsewhere classified: Secondary | ICD-10-CM

## 2016-09-20 DIAGNOSIS — R262 Difficulty in walking, not elsewhere classified: Secondary | ICD-10-CM

## 2016-09-20 NOTE — Therapy (Signed)
[place], [place], [address] Phone: [phone]   Fax:  [phone]  Physical Therapy Treatment  Patient Details  Name: Nicole Baxter MRN: [id number] Date of Birth: 03-Sep-1941 Referring Provider: Duwaine Maxin   Encounter Date: 09/20/2016      PT End of Session - 09/20/16 1128    Visit Number 26   Number of Visits 28   Date for PT Re-Evaluation 09/27/16   Authorization Type [employer] (G-codes done 20th visit)   Authorization Time Period 06/22/35 to 6/44/03; recert done 03/04/41; recert done 04/06/55; recert done 38/7    Authorization - Visit Number 26   Authorization - Number of Visits 30   PT Start Time 1033   PT Stop Time 1112   PT Time Calculation (min) 39 min   Activity Tolerance Patient tolerated treatment well   Behavior During Therapy [facility] for tasks assessed/performed      Past Medical History:  Diagnosis Date  . Arthritis   . GERD (gastroesophageal reflux disease)    pt. states she no longer has GERD (09/11/15)  . Hyperglycemia    isolated FBS 122, 95, 104 (Random 138)  . Hyperlipidemia    on pravastatin  . Hypertension    controlled on 2 agents  . 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 Obesity   . Parkinson's disease (tremor, stiffness, slow motion, unstable posture) ([place])   . Shortness of breath dyspnea    pt. states that she no longer has SOB )09/11/15  . Tremor     Past Surgical History:  Procedure Laterality Date  . CESAREAN SECTION    . COLONOSCOPY    . TOTAL KNEE ARTHROPLASTY Left 09/23/2015   Procedure: LEFT TOTAL KNEE ARTHROPLASTY;  Surgeon: Ninetta Lights, MD;  Location: [place];  Service: Orthopedics;  Laterality: Left;    There were no vitals filed for this visit.      Subjective Assessment - 09/20/16 1037    Subjective Patient arrives today stating that she is feeling OK today, she did not have any falls this weekend, her knee feels pretty good today.  Nothing else major going on, she is having a hard time telling if bed mobilty is getting easier or not.    Pertinent History blood thinner (now currently taking per patient- will check with MD), HTN, GERD, DM, Parkinson, OA, SOB, hx of acute blood clot in January 2017; history of macular degeneration   Patient Stated Goals walking better, better mobility    Currently in Pain? Yes   Pain Score 1    Pain Location Shoulder   Pain Orientation Left   Pain Descriptors / Indicators Discomfort   Pain Type Chronic pain   Pain Radiating Towards none    Pain Onset More than a month ago   Pain Frequency Intermittent   Aggravating Factors  not sure    Pain Relieving Factors pain gel    Effect of Pain on Daily Activities nothing                          [facility] Adult PT Treatment/Exercise - 09/20/16 0001      Therapeutic Activites    Other Therapeutic Activities bed mobiliy: supine <->sit, rolling           PWR [facility]) - 09/20/16 1126    PWR! Up 1x6 seated ball and cone    PWR! Rock 1x6 seated cone  PT Education - 09/20/16 1127    Education provided Yes   Education Details POC moving forward    Person(s) Educated Patient;Spouse   Methods Explanation   Comprehension Verbalized understanding          PT Short Term Goals - 08/30/16 1104      PT SHORT TERM GOAL #1   Title Patient to be able to complete all bed mobility with Min assist in order to demonstrate improved mobility and need for less assistance from family at home    Baseline 10/3- min guard to min(A)   Time 4   Period Weeks   Status Achieved     PT SHORT TERM GOAL #2   Title Patient to be able to complete all stand to sit/sit to stand transfers from various height surfaces with Min assist in order to demonstrate improved mobility and need for less assistance from family at home    Baseline 10/3- mod(I) to S    Time 4   Period Weeks   Status Achieved     PT SHORT TERM GOAL #3   Title  Patient to demonstrate consistent improvements in gait pattern with LRAD, including minimal festination, increased step lengths/stance times, improved posture, and minimal instabilty in order to improve moblity at home and in community    Baseline 10/3- improving but deteriorates with fatigue    Time 4   Period Weeks   Status On-going     PT SHORT TERM GOAL #4   Title Patient to demonstrate improved posture at least 70% of the time  in order to improve balance and overall mechanics of mobility    Time 4   Period Weeks   Status On-going     PT SHORT TERM GOAL #5   Title Patient to experience R ankle pain no more than 2/10 in order to improve QOL and increase tolerance of weight bearing activities    Baseline 10/3- painfree usually    Time 4   Period Weeks   Status Achieved     PT SHORT TERM GOAL #6   Title Patient to be independent in correctly and consistently performing appropriate HEP, to be updated PRN    Baseline 10/3- difficulty with compliance    Time 4   Period Weeks   Status On-going           PT Long Term Goals - 08/30/16 1106      PT LONG TERM GOAL #1   Title Patient to be able to complete all bed mobility and sit to stand/stand to sit transfers with LRAD and Mod(I) in order to demonstrate improved mobility and overall QOL    Time 8   Period Weeks   Status On-going     PT LONG TERM GOAL #2   Title Patient to be able to ambulate for at least 20 minutes with mechanics WFL, LRAD, pain no more than 1/10 in order to demonstrate improved mobilty and overall QOL    Time 8   Period Weeks   Status On-going     PT LONG TERM GOAL #3   Title Patient to demonstrate strength 4/5 in all tested muscle groups in order to assist in improving mobiltiy, reducing pain, and improving balance    Time 8   Period Weeks   Status Partially Met     PT LONG TERM GOAL #4   Title Patient to demonstrate full R ankle ROM and experience no more than occasional R ankle pain 1/10 in order  to improve tolerance to mobility and assist in improving QOL    Baseline 10/3- DNT    Time 8   Period Weeks   Status Deferred     PT LONG TERM GOAL #5   Title Patient to be participatory in regular exercise at least 20 minutes in duration, of moderate intensity, at least 4 days per week, in order to maintain functional gains and assist in managing parkinsonian symptoms    Time 8   Period Weeks   Status On-going     PT LONG TERM GOAL #6   Title Patient to report no falls within the past 4 weeks in order to demonstrate improved safety at home and within community    Baseline 10/3- no falls recently    Time 8   Period Weeks   Status Achieved               Plan - 09/20/16 1128    Clinical Impression Statement Began session with some seated PWR exercises in sitting for posture and warmup today, then proceedd with bed mobility work in both sit<->supine and rolling with only min guard to min(A) provided by PT, however approximately mod verbal cues were given for form. Patient continues to be limited by pain in L UE, however she has been scheduled for formal OT evaluation next week; for now advised patient to mobilize and use L UE as much as tolerated to assist in avoiding deleterious effects of immobility/guarding of UE. Continue to plan for DC from PT in 2 sessions.    Rehab Potential Good   PT Frequency 2x / week   PT Duration 4 weeks   PT Treatment/Interventions ADLs/Self Care Home Management;Biofeedback;Gait training;Stair training;Functional mobility training;Therapeutic activities;Therapeutic exercise;Balance training;Neuromuscular re-education;Patient/family education;Manual techniques;Passive range of motion;Energy conservation;Taping   PT Next Visit Plan focus primarily on bed mobility; 1 more treatments then re-assess/likely DC to independent program    PT Home Exercise Plan 10/10: heel toe gait, LAQs, toe raises in sitting, seated hip ABD, bed mobility    Consulted and Agree  with Plan of Care Patient   Family Member Consulted spouse       Patient will benefit from skilled therapeutic intervention in order to improve the following deficits and impairments:  Abnormal gait, Decreased endurance, Hypomobility, Impaired tone, Decreased activity tolerance, Decreased strength, Pain, Decreased balance, Decreased mobility, Difficulty walking, Decreased range of motion, Improper body mechanics, Decreased coordination, Decreased safety awareness, Impaired flexibility, Postural dysfunction, Decreased knowledge of use of DME  Visit Diagnosis: Unsteadiness on feet  Difficulty in walking, not elsewhere classified  History of falling  Muscle weakness (generalized)  Abnormal posture  Pain in right ankle and joints of right foot  Stiffness of right ankle, not elsewhere classified     Problem List Patient Active Problem List   Diagnosis Date Noted  . Urine discoloration 08/16/2016  . Left shoulder pain 08/16/2016  . Achilles tendinitis of right lower extremity 04/05/2016  . Hypertensive retinopathy of both eyes, grade 2 01/15/2016  . Left leg DVT ([place]) 12/18/2015  . 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 Internal hemorrhoids 03/10/2015  . History of colon polyps 12/25/2014  . Health care maintenance 02/13/2014  . Subclinical hyperthyroidism 12/25/2013  . Right renal mass 06/11/2012  . Osteoarthritis of both knees 03/15/2012  . Parkinsonism ([place]) 07/13/2010  . Diabetes mellitus type 2, diet-controlled ([place]) 05/22/2009  . ALLERGIC RHINITIS, SEASONAL 02/20/2007  . Hyperlipidemia 09/27/2006  . Essential hypertension 09/27/2006  . GERD 09/27/2006  Deniece Ree PT, DPT [phone]  [facility] [address] [place], [place], [address] Phone: [phone]   Fax:  [phone]  Name: INZA MIKRUT MRN: [id number] Date of Birth: [birth year]

## 2016-09-22 ENCOUNTER — Ambulatory Visit (HOSPITAL_COMMUNITY): Payer: PPO | Admitting: Physical Therapy

## 2016-09-22 DIAGNOSIS — M6281 Muscle weakness (generalized): Secondary | ICD-10-CM

## 2016-09-22 DIAGNOSIS — R2681 Unsteadiness on feet: Secondary | ICD-10-CM | POA: Diagnosis not present

## 2016-09-22 DIAGNOSIS — R262 Difficulty in walking, not elsewhere classified: Secondary | ICD-10-CM

## 2016-09-22 DIAGNOSIS — M25671 Stiffness of right ankle, not elsewhere classified: Secondary | ICD-10-CM

## 2016-09-22 DIAGNOSIS — Z9181 History of falling: Secondary | ICD-10-CM

## 2016-09-22 DIAGNOSIS — R293 Abnormal posture: Secondary | ICD-10-CM

## 2016-09-22 DIAGNOSIS — M25571 Pain in right ankle and joints of right foot: Secondary | ICD-10-CM

## 2016-09-22 NOTE — Therapy (Signed)
[place], [place], [address] Phone: [phone]   Fax:  [phone]  Physical Therapy Treatment  Patient Details  Name: Nicole Baxter MRN: [id number] Date of Birth: 16-Jul-1941 Referring Provider: Duwaine Maxin   Encounter Date: 09/22/2016      PT End of Session - 09/22/16 1239    Visit Number 27   Number of Visits 28   Date for PT Re-Evaluation 09/27/16   Authorization Type [employer] (G-codes done 20th visit)   Authorization Time Period 0/96/28 to 3/66/29; recert done 03/04/64; recert done 03/03/49; recert done 35/4    Authorization - Visit Number 28   Authorization - Number of Visits 30   PT Start Time 1032   PT Stop Time 1110   PT Time Calculation (min) 38 min   Activity Tolerance Patient tolerated treatment well   Behavior During Therapy [facility] for tasks assessed/performed      Past Medical History:  Diagnosis Date  . Arthritis   . GERD (gastroesophageal reflux disease)    pt. states she no longer has GERD (09/11/15)  . Hyperglycemia    isolated FBS 122, 95, 104 (Random 138)  . Hyperlipidemia    on pravastatin  . Hypertension    controlled on 2 agents  . 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 Obesity   . Parkinson's disease (tremor, stiffness, slow motion, unstable posture) ([place])   . Shortness of breath dyspnea    pt. states that she no longer has SOB )09/11/15  . Tremor     Past Surgical History:  Procedure Laterality Date  . CESAREAN SECTION    . COLONOSCOPY    . TOTAL KNEE ARTHROPLASTY Left 09/23/2015   Procedure: LEFT TOTAL KNEE ARTHROPLASTY;  Surgeon: Ninetta Lights, MD;  Location: [place];  Service: Orthopedics;  Laterality: Left;    There were no vitals filed for this visit.      Subjective Assessment - 09/22/16 1036    Subjective patient arrives today stating she is feeling OK, no major changes or falls since last session    Pertinent History blood thinner (now  currently taking per patient- will check with MD), HTN, GERD, DM, Parkinson, OA, SOB, hx of acute blood clot in January 2017; history of macular degeneration   Patient Stated Goals walking better, better mobility    Currently in Pain? Yes   Pain Score 2    Pain Location Shoulder   Pain Orientation Left                         [facility] Adult PT Treatment/Exercise - 09/22/16 0001      Therapeutic Activites    Other Therapeutic Activities bed mobilty: sit<->supine; rolling            PWR [facility]) - 09/22/16 1237    PWR! Up 1x6 seated with cone/ball    PWR! Step 2x6 with hurdles/cones    Comments max cues for form today             PT Education - 09/22/16 1239    Education provided Yes   Education Details POC moving forward    Person(s) Educated Patient;Spouse   Methods Explanation   Comprehension Verbalized understanding          PT Short Term Goals - 08/30/16 1104      PT SHORT TERM GOAL #1   Title Patient to be able to complete  all bed mobility with Min assist in order to demonstrate improved mobility and need for less assistance from family at home    Baseline 10/3- min guard to min(A)   Time 4   Period Weeks   Status Achieved     PT SHORT TERM GOAL #2   Title Patient to be able to complete all stand to sit/sit to stand transfers from various height surfaces with Min assist in order to demonstrate improved mobility and need for less assistance from family at home    Baseline 10/3- mod(I) to S    Time 4   Period Weeks   Status Achieved     PT SHORT TERM GOAL #3   Title Patient to demonstrate consistent improvements in gait pattern with LRAD, including minimal festination, increased step lengths/stance times, improved posture, and minimal instabilty in order to improve moblity at home and in community    Baseline 10/3- improving but deteriorates with fatigue    Time 4   Period Weeks   Status On-going     PT SHORT TERM GOAL #4   Title Patient  to demonstrate improved posture at least 70% of the time  in order to improve balance and overall mechanics of mobility    Time 4   Period Weeks   Status On-going     PT SHORT TERM GOAL #5   Title Patient to experience R ankle pain no more than 2/10 in order to improve QOL and increase tolerance of weight bearing activities    Baseline 10/3- painfree usually    Time 4   Period Weeks   Status Achieved     PT SHORT TERM GOAL #6   Title Patient to be independent in correctly and consistently performing appropriate HEP, to be updated PRN    Baseline 10/3- difficulty with compliance    Time 4   Period Weeks   Status On-going           PT Long Term Goals - 08/30/16 1106      PT LONG TERM GOAL #1   Title Patient to be able to complete all bed mobility and sit to stand/stand to sit transfers with LRAD and Mod(I) in order to demonstrate improved mobility and overall QOL    Time 8   Period Weeks   Status On-going     PT LONG TERM GOAL #2   Title Patient to be able to ambulate for at least 20 minutes with mechanics WFL, LRAD, pain no more than 1/10 in order to demonstrate improved mobilty and overall QOL    Time 8   Period Weeks   Status On-going     PT LONG TERM GOAL #3   Title Patient to demonstrate strength 4/5 in all tested muscle groups in order to assist in improving mobiltiy, reducing pain, and improving balance    Time 8   Period Weeks   Status Partially Met     PT LONG TERM GOAL #4   Title Patient to demonstrate full R ankle ROM and experience no more than occasional R ankle pain 1/10 in order to improve tolerance to mobility and assist in improving QOL    Baseline 10/3- DNT    Time 8   Period Weeks   Status Deferred     PT LONG TERM GOAL #5   Title Patient to be participatory in regular exercise at least 20 minutes in duration, of moderate intensity, at least 4 days per week, in order to maintain functional  gains and assist in managing parkinsonian symptoms    Time  8   Period Weeks   Status On-going     PT LONG TERM GOAL #6   Title Patient to report no falls within the past 4 weeks in order to demonstrate improved safety at home and within community    Baseline 10/3- no falls recently    Time 8   Period Weeks   Status Achieved               Plan - 09/22/16 1239    Clinical Impression Statement Patient arrives today with her husband stating that he forgot to give her her medicines this morning and with patient having significantly increased difficulty with all mobility this session, requiring mod-max verbal and tactile cues this session, also with increased L UE pain. Encouraged patient/husband to take medication as prescribed/scheduled due to severe negative effects on mobility when missing a dose, educated on POC moving forward.    Rehab Potential Good   PT Frequency 2x / week   PT Duration 4 weeks   PT Treatment/Interventions ADLs/Self Care Home Management;Biofeedback;Gait training;Stair training;Functional mobility training;Therapeutic activities;Therapeutic exercise;Balance training;Neuromuscular re-education;Patient/family education;Manual techniques;Passive range of motion;Energy conservation;Taping   PT Next Visit Plan DC    PT Home Exercise Plan 10/10: heel toe gait, LAQs, toe raises in sitting, seated hip ABD, bed mobility    Consulted and Agree with Plan of Care Patient   Family Member Consulted spouse       Patient will benefit from skilled therapeutic intervention in order to improve the following deficits and impairments:  Abnormal gait, Decreased endurance, Hypomobility, Impaired tone, Decreased activity tolerance, Decreased strength, Pain, Decreased balance, Decreased mobility, Difficulty walking, Decreased range of motion, Improper body mechanics, Decreased coordination, Decreased safety awareness, Impaired flexibility, Postural dysfunction, Decreased knowledge of use of DME  Visit Diagnosis: Unsteadiness on  feet  Difficulty in walking, not elsewhere classified  History of falling  Muscle weakness (generalized)  Abnormal posture  Pain in right ankle and joints of right foot  Stiffness of right ankle, not elsewhere classified     Problem List Patient Active Problem List   Diagnosis Date Noted  . Urine discoloration 08/16/2016  . Left shoulder pain 08/16/2016  . Achilles tendinitis of right lower extremity 04/05/2016  . Hypertensive retinopathy of both eyes, grade 2 01/15/2016  . Left leg DVT ([place]) 12/18/2015  . 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 Internal hemorrhoids 03/10/2015  . History of colon polyps 12/25/2014  . Health care maintenance 02/13/2014  . Subclinical hyperthyroidism 12/25/2013  . Right renal mass 06/11/2012  . Osteoarthritis of both knees 03/15/2012  . Parkinsonism ([place]) 07/13/2010  . Diabetes mellitus type 2, diet-controlled ([place]) 05/22/2009  . ALLERGIC RHINITIS, SEASONAL 02/20/2007  . Hyperlipidemia 09/27/2006  . Essential hypertension 09/27/2006  . GERD 09/27/2006    Deniece Ree PT, DPT [facility] [facility] [address] [place], [place], [address] Phone: [phone]   Fax:  [phone]  Name: MICHEALE SCHLACK MRN: [id number] Date of Birth: 30-Jul-1941

## 2016-09-27 ENCOUNTER — Ambulatory Visit (HOSPITAL_COMMUNITY): Payer: PPO | Admitting: Physical Therapy

## 2016-09-27 ENCOUNTER — Encounter (HOSPITAL_COMMUNITY): Payer: Self-pay

## 2016-09-27 ENCOUNTER — Ambulatory Visit (HOSPITAL_COMMUNITY): Payer: PPO

## 2016-09-27 DIAGNOSIS — R2681 Unsteadiness on feet: Secondary | ICD-10-CM | POA: Diagnosis not present

## 2016-09-27 DIAGNOSIS — R262 Difficulty in walking, not elsewhere classified: Secondary | ICD-10-CM

## 2016-09-27 DIAGNOSIS — M25571 Pain in right ankle and joints of right foot: Secondary | ICD-10-CM

## 2016-09-27 DIAGNOSIS — M25612 Stiffness of left shoulder, not elsewhere classified: Secondary | ICD-10-CM

## 2016-09-27 DIAGNOSIS — R293 Abnormal posture: Secondary | ICD-10-CM

## 2016-09-27 DIAGNOSIS — M25671 Stiffness of right ankle, not elsewhere classified: Secondary | ICD-10-CM

## 2016-09-27 DIAGNOSIS — M25512 Pain in left shoulder: Secondary | ICD-10-CM

## 2016-09-27 DIAGNOSIS — Z9181 History of falling: Secondary | ICD-10-CM

## 2016-09-27 DIAGNOSIS — M6281 Muscle weakness (generalized): Secondary | ICD-10-CM

## 2016-09-27 DIAGNOSIS — R29898 Other symptoms and signs involving the musculoskeletal system: Secondary | ICD-10-CM

## 2016-09-27 NOTE — Therapy (Signed)
[place] ([facility] [address] [place], [place], [address] Phone: [phone]   Fax:  [phone]  Occupational Therapy Evaluation  Patient Details  Name: Nicole Baxter MRN: [id number] Date of Birth: 1941/02/09 Referring Provider: Tyson Alias, MD  Encounter Date: 09/27/2016      OT End of Session - 09/27/16 1418    Visit Number 1   Number of Visits 8   Date for OT Re-Evaluation 10/27/16   Authorization Type [employer] ($15 co pay)   Authorization Time Period before 10th visit   Authorization - Visit Number 1   Authorization - Number of Visits 10   OT Start Time 1115   OT Stop Time 1200   OT Time Calculation (min) 45 min   Activity Tolerance Patient tolerated treatment well   Behavior During Therapy Nicole Baxter for tasks assessed/performed      Past Nicole History:  Diagnosis Date  . Arthritis   . GERD (gastroesophageal reflux disease)    pt. states she no longer has GERD (09/11/15)  . Hyperglycemia    isolated FBS 122, 95, 104 (Random 138)  . Hyperlipidemia    on pravastatin  . Hypertension    controlled on 2 agents  . 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 Obesity   . Parkinson's disease (tremor, stiffness, slow motion, unstable posture) ([facility])   . Shortness of breath dyspnea    pt. states that she no longer has SOB )09/11/15  . Tremor     Past Surgical History:  Procedure Laterality Date  . CESAREAN SECTION    . COLONOSCOPY    . TOTAL KNEE ARTHROPLASTY Left 09/23/2015   Procedure: LEFT TOTAL KNEE ARTHROPLASTY;  Surgeon: Nicole Ave, MD;  Location: [facility];  Service: Orthopedics;  Laterality: Left;    There were no vitals filed for this visit.      Subjective Assessment - 09/27/16 1120    Subjective  S: It's not as bad as it was.    Patient is accompained by: Nicole Baxter  Nicole Baxter   Pertinent History Patient is a 75 y/o female with a history of parkinsons disease presenting with left  shoulder pain which occured after she fell a month ago. Since her fall she has had increased pain and decrease ROM and ability to use her left arm during functional reaching. An X-ray was ordered although no X-ray was completed. Dr. Oswaldo Baxter has referred patien to occupational therapy for evaluation and treatment.    Special Tests FOTO to be completed at next session.   Currently in Pain? Yes   Pain Score 1    Pain Location Shoulder   Pain Orientation Left   Pain Descriptors / Indicators Aching   Pain Type Acute pain   Pain Radiating Towards None   Pain Onset More than a month ago   Pain Frequency Occasional   Aggravating Factors  when trying to lie down   Pain Relieving Factors getting in the right position   Effect of Pain on Daily Activities Nothing   Multiple Pain Sites No           Nicole [facility] OT Assessment - 09/27/16 1124      Assessment   Diagnosis left shoulder pain   Referring Provider Nicole Alias, MD   Onset Date --  1 month ago   Prior Therapy None     Precautions   Precautions Fall     Restrictions   Weight Bearing Restrictions  No     Balance Screen   Has the patient fallen in the past 6 months Yes   How many times? 2   Has the patient had a decrease in activity level because of a fear of falling?  Yes   Is the patient reluctant to leave their home because of a fear of falling?  Yes     Home  Environment   Nicole/patient expects to be discharged to: Private residence     Prior Function   Level of Independence Needs assistance with transfers;Needs assistance with homemaking;Needs assistance with ADLs;Needs assistance with gait   Vocation Retired     ADL   ADL comments Difficulty when trying to sleep on left side and unable to move LUE to get into a comfortable position. Difficulty with reaching above head or out to the side.      Mobility   Mobility Status History of falls     Written Expression   Dominant Hand Right     Vision - History    Baseline Vision No visual deficits     ROM / Strength   AROM / PROM / Strength AROM;PROM;Strength     Palpation   Palpation comment Mod fascial restrictions in left upper arm, trapezius, and scapularis region.      AROM   Overall AROM Comments Assessed seated. IR/er adducted   AROM Assessment Site Shoulder   Right/Left Shoulder Left   Left Shoulder Flexion 74 Degrees   Left Shoulder ABduction 75 Degrees   Left Shoulder Internal Rotation 90 Degrees   Left Shoulder External Rotation 33 Degrees     PROM   Overall PROM Comments Assessed supinr. IR/er adducted.   PROM Assessment Site Shoulder   Right/Left Shoulder Left   Left Shoulder Flexion 120 Degrees   Left Shoulder ABduction 114 Degrees   Left Shoulder Internal Rotation 90 Degrees   Left Shoulder External Rotation 41 Degrees     Strength   Overall Strength Comments Assessed seated. IR/er adducted.   Strength Assessment Site Shoulder   Right/Left Shoulder Left   Left Shoulder Flexion 3-/5   Left Shoulder ABduction 3-/5   Left Shoulder Internal Rotation 3/5   Left Shoulder External Rotation 3-/5                         OT Education - 09/27/16 1416    Education provided Yes   Education Details table slides. Education given to continue to complete functional tasks with LUE as much as possible. Examples: attempt to reach for items first with left UE.    Person(s) Educated Patient;Spouse   Methods Explanation;Demonstration;Verbal cues;Handout   Comprehension Returned demonstration;Verbalized understanding          OT Short Term Goals - 09/27/16 1421      OT SHORT TERM GOAL #1   Title Patient will be educated and able to complete HEP and/or functional mobility tasks with supervision or min assist if needed.    Time 4   Period Weeks   Status New     OT SHORT TERM GOAL #2   Title Patient will increase A/ROM to [facility] PartnershipWFL to increase ability to reaching at shoulder level or higher during functional tasks.     Time 4   Period Weeks   Status New     OT SHORT TERM GOAL #3   Title Patient will report a pain level that does not increase above 1/10 during daily tasks.  Time 4   Period Weeks   Status New     OT SHORT TERM GOAL #4   Title Patient will increase LUE strength to 3/5 to increase ability position self comfortably on left side with less difficulty at night.   Time 4   Period Weeks   Status New     OT SHORT TERM GOAL #5   Title Patient will decrease fascial restrictions in LUE to min amount or less to increase abilty to complete functional reaching tasks with less difficulty.    Time 4   Period Weeks   Status New                  Plan - 10-25-2016 1419    Clinical Impression Statement A: Patient is a 75 y/o female S/P left shoulder pain causing increased fascial restrictions, pain, and decreased strength and ROM resulting in difficulty completing daily tasks such as ability to position self in bed and when reaching above head or overhead.   Rehab Potential Good   Clinical Impairments Affecting Rehab Potential Parkinson's Disease   OT Frequency 2x / week   OT Duration 4 weeks   OT Treatment/Interventions Self-care/ADL training;Ultrasound;DME and/or AE instruction;Passive range of motion;Patient/Nicole education;Dry needling;Cryotherapy;Electrical Stimulation;Moist Heat;Therapeutic activities;Therapeutic exercises;Manual Therapy   Plan P: Pt will benefit from skilled OT services to increase functional performance during daily and night time positioning when using LUE. Treatment plan: Myofascial release, manual stretching, P/ROM, AA/ROM, A/ROM and general strengthening.    OT Home Exercise Plan 10-26-2023: table slides   Consulted and Agree with Plan of Care Patient;Nicole Baxter/caregiver   Nicole Baxter Consulted Nicole Baxter      Patient will benefit from skilled therapeutic intervention in order to improve the following deficits and impairments:  Decreased strength, Pain,  Decreased range of motion, Impaired UE functional use, Increased fascial restricitons  Visit Diagnosis: Other symptoms and signs involving the musculoskeletal system - Plan: Ot plan of care cert/re-cert  Stiffness of left shoulder, not elsewhere classified - Plan: Ot plan of care cert/re-cert  Acute pain of left shoulder - Plan: Ot plan of care cert/re-cert      G-Codes - 10-25-16 1425    Functional Assessment Tool Used Clinical judgement   Functional Limitation Carrying, moving and handling objects   Carrying, Moving and Handling Objects Current Status [id number]) At least 60 percent but less than 80 percent impaired, limited or restricted   Carrying, Moving and Handling Objects Goal Status (U0454) At least 40 percent but less than 60 percent impaired, limited or restricted      Problem List Patient Active Problem List   Diagnosis Date Noted  . Urine discoloration 08/16/2016  . Left shoulder pain 08/16/2016  . Achilles tendinitis of right lower extremity 04/05/2016  . Hypertensive retinopathy of both eyes, grade 2 01/15/2016  . Left leg DVT ([facility]) 12/18/2015  . 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 Internal hemorrhoids 03/10/2015  . History of colon polyps 12/25/2014  . Health care maintenance 02/13/2014  . Subclinical hyperthyroidism 12/25/2013  . Right renal mass 06/11/2012  . Osteoarthritis of both knees 03/15/2012  . Parkinsonism ([facility]) 07/13/2010  . Diabetes mellitus type 2, diet-controlled ([facility]) 05/22/2009  . ALLERGIC RHINITIS, SEASONAL 02/20/2007  . Hyperlipidemia 10/25/06  . Essential hypertension 2006/10/25  . GERD 10/25/06   Limmie Patricia, OTR/L,CBIS  [phone]  2016-10-25, 2:27 PM  [facility] [address] [place], [place], [address] Phone: [phone]   Fax:  [phone]  Name: Nicole Baxter MRN: [id number] Date of Birth: 02/13/1941

## 2016-09-27 NOTE — Therapy (Signed)
[place], [place], [address] Phone: [phone]   Fax:  [phone]  Physical Therapy Treatment (Discharge)  Patient Details  Name: Nicole Baxter MRN: [id number] Date of Birth: 14-Dec-1940 Referring Provider: Duwaine Maxin   Encounter Date: 09/27/2016      PT End of Session - 09/27/16 1120    Visit Number 28   Number of Visits 28   Authorization Type [employer] (G-codes done 02/03/2023 visit)   Authorization Time Period 9/92/42 to 6/83/41; recert done 08/03/21; recert done 01/06/78; recert done 89/2    Authorization - Visit Number 28   Authorization - Number of Visits 30   PT Start Time 1039  patient arrived a few minutes late    PT Stop Time 1111   PT Time Calculation (min) 32 min   Activity Tolerance Patient tolerated treatment well   Behavior During Therapy [facility] for tasks assessed/performed      Past Medical History:  Diagnosis Date  . Arthritis   . GERD (gastroesophageal reflux disease)    pt. states she no longer has GERD (09/11/15)  . Hyperglycemia    isolated FBS 122, 95, 104 (Random 138)  . Hyperlipidemia    on pravastatin  . Hypertension    controlled on 2 agents  . 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 Obesity   . Parkinson's disease (tremor, stiffness, slow motion, unstable posture) ([place])   . Shortness of breath dyspnea    pt. states that she no longer has SOB )09/11/15  . Tremor     Past Surgical History:  Procedure Laterality Date  . CESAREAN SECTION    . COLONOSCOPY    . TOTAL KNEE ARTHROPLASTY Left 09/23/2015   Procedure: LEFT TOTAL KNEE ARTHROPLASTY;  Surgeon: Ninetta Lights, MD;  Location: [facility];  Service: Orthopedics;  Laterality: Left;    There were no vitals filed for this visit.      Subjective Assessment - 09/27/16 1044    Subjective Patient arrives a few minutes late, states that as long as she takes her time she can do what she needs to do. Walking is  still tough for her, as is getting in and out of bed. She states that she is taking her medicines as prescribed.    Pertinent History blood thinner (now currently taking per patient- will check with MD), HTN, GERD, DM, Parkinson, OA, SOB, hx of acute blood clot in January 2017; history of macular degeneration   Patient Stated Goals walking better, better mobility    Currently in Pain? Yes   Pain Score 3    Pain Location Knee   Pain Orientation Right   Pain Descriptors / Indicators Tingling   Pain Type Chronic pain   Pain Radiating Towards none    Pain Onset More than a month ago   Pain Frequency Intermittent   Aggravating Factors  not sure, maybe walking on it    Pain Relieving Factors pain gel    Effect of Pain on Daily Activities nothing             [facility] PT Assessment - 09/27/16 0001      Strength   Overall Strength Comments trunk approximately 4/5 all directions    Right Hip Flexion 3/5   Left Hip Flexion 3/5   Left Hip ABduction 2+/5   Right Knee Extension 5/5   Left Knee Extension 4+/5   Right Ankle Dorsiflexion 5/5   Left  Ankle Dorsiflexion 4+/5     Bed Mobility   Rolling Right 3: Mod assist   Rolling Left 3: Mod assist   Supine to Sit 5: Supervision   Sit to Supine 3: Mod assist      SLS 4 second max B LEs   Sit to stand with S-Mod(I)               [facility] Adult PT Treatment/Exercise - 09/27/16 0001      Ambulation/Gait   Gait Comments classic Parkinsonian gait pattern with shuffling, occasional freezing, shuffling, unsteadiness                 PT Education - 09/27/16 1120    Education provided Yes   Education Details DC today, progress with skilled PT services, recommendation for regular activity at senior center    [facility] Educated Patient   Methods Explanation   Comprehension Verbalized understanding          PT Short Term Goals - 09/27/16 1102      PT SHORT TERM GOAL #1   Title Patient to be able to complete all bed  mobility with Min assist in order to demonstrate improved mobility and need for less assistance from family at home    Baseline 10/31- ranges from min guard to Mod(A)   Period Weeks   Status Not Met     PT SHORT TERM GOAL #2   Title Patient to be able to complete all stand to sit/sit to stand transfers from various height surfaces with Min assist in order to demonstrate improved mobility and need for less assistance from family at home    Baseline 10/31- mod(I) to S    Time 4   Period Weeks   Status Achieved     PT SHORT TERM GOAL #3   Title Patient to demonstrate consistent improvements in gait pattern with LRAD, including minimal festination, increased step lengths/stance times, improved posture, and minimal instabilty in order to improve moblity at home and in community    Baseline 10/31- improved as compared to initial eval but continues to demonstrate impairment    Time 4   Period Weeks   Status Partially Met     PT SHORT TERM GOAL #4   Title Patient to demonstrate improved posture at least 70% of the time  in order to improve balance and overall mechanics of mobility    Baseline 10/31- continues to demonstrate postural impairment    Time 4   Period Weeks   Status On-going     PT SHORT TERM GOAL #5   Title Patient to experience R ankle pain no more than 2/10 in order to improve QOL and increase tolerance of weight bearing activities    Baseline 10/31- painfree consistently    Time 4   Period Weeks   Status Achieved     PT SHORT TERM GOAL #6   Title Patient to be independent in correctly and consistently performing appropriate HEP, to be updated PRN    Baseline 10/31- so-so, poor compliance and lists activities/exercises not assigned when queried    Time 4   Period Weeks   Status Not Met           PT Long Term Goals - 09/27/16 1105      PT LONG TERM GOAL #1   Title Patient to be able to complete all bed mobility and sit to stand/stand to sit transfers with LRAD and  Mod(I) in order to demonstrate improved mobility  and overall QOL    Time 8   Period Weeks   Status Not Met     PT LONG TERM GOAL #2   Title Patient to be able to ambulate for at least 20 minutes with mechanics WFL, LRAD, pain no more than 1/10 in order to demonstrate improved mobilty and overall QOL    Time 8   Period Weeks   Status Not Met     PT LONG TERM GOAL #3   Title Patient to demonstrate strength 4/5 in all tested muscle groups in order to assist in improving mobiltiy, reducing pain, and improving balance    Baseline 10/31- leg strength has met goal, proximal muscles remain weak    Time 8   Period Weeks   Status Partially Met     PT LONG TERM GOAL #4   Title Patient to demonstrate full R ankle ROM and experience no more than occasional R ankle pain 1/10 in order to improve tolerance to mobility and assist in improving QOL    Baseline 10/31- painfree, DNT ROM    Time 8   Period Weeks   Status Partially Met     PT LONG TERM GOAL #5   Title Patient to be participatory in regular exercise at least 20 minutes in duration, of moderate intensity, at least 4 days per week, in order to maintain functional gains and assist in managing parkinsonian symptoms    Baseline 10/31- discussed senior center    Time 8   Period Weeks   Status On-going     PT LONG TERM GOAL #6   Title Patient to report no falls within the past 4 weeks in order to demonstrate improved safety at home and within community    Baseline 10/31- one fall within the past month    Time 8   Period Weeks   Status On-going               Plan - 09/27/16 1121    Clinical Impression Statement Re-assessment performed today. While patient does appear to have made some subjective and objective improvements as compared to her status at initial evaluation, she continues to be severely limited by Parkinson's related gait deviations and occasional freezing, postural deficits, functional strength deficits, unsteadiness,  and difficulty with functional mobility. Skilled PT services have completely resolved her complaints of R ankle pain however primary limitations appear to be resulting from Parkinson's Disease at this time. Have extensively worked on bed mobility over the course of the past few weeks however patient continues to struggle with this. She reports that she "does some sort of exercise every day" but when queried does not list specific exercises DPT gave her; did not update HEP besides recommendation for senior center due to significant concerns over compliance and correct form. DC today from skilled PT services due to lack of progress.    Rehab Potential Good   PT Next Visit Plan DC today    PT Home Exercise Plan 10/10: heel toe gait, LAQs, toe raises in sitting, seated hip ABD, bed mobility    Consulted and Agree with Plan of Care Patient      Patient will benefit from skilled therapeutic intervention in order to improve the following deficits and impairments:  Abnormal gait, Decreased endurance, Hypomobility, Impaired tone, Decreased activity tolerance, Decreased strength, Pain, Decreased balance, Decreased mobility, Difficulty walking, Decreased range of motion, Improper body mechanics, Decreased coordination, Decreased safety awareness, Impaired flexibility, Postural dysfunction, Decreased knowledge of use of DME  Visit Diagnosis: Unsteadiness on feet  Difficulty in walking, not elsewhere classified  History of falling  Muscle weakness (generalized)  Abnormal posture  Pain in right ankle and joints of right foot  Stiffness of right ankle, not elsewhere classified       G-Codes - 2016/10/03 1122    Functional Assessment Tool Used Based on skilled clinical assessment of gait, functional mobility, functional balance,strength, safety awareness, ROM    Functional Limitation Mobility: Walking and moving around   Mobility: Walking and Moving Around Goal Status [phone]) At least 20 percent but  less than 40 percent impaired, limited or restricted   Mobility: Walking and Moving Around Discharge Status [phone]) At least 40 percent but less than 60 percent impaired, limited or restricted      Problem List Patient Active Problem List   Diagnosis Date Noted  . Urine discoloration 08/16/2016  . Left shoulder pain 08/16/2016  . Achilles tendinitis of right lower extremity 04/05/2016  . Hypertensive retinopathy of both eyes, grade 2 01/15/2016  . Left leg DVT ([place]) 12/18/2015  . 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 Internal hemorrhoids 03/10/2015  . History of colon polyps 12/25/2014  . Health care maintenance 02/13/2014  . Subclinical hyperthyroidism 12/25/2013  . Right renal mass 06/11/2012  . Osteoarthritis of both knees 03/15/2012  . Parkinsonism ([place]) 07/13/2010  . Diabetes mellitus type 2, diet-controlled ([place]) 05/22/2009  . ALLERGIC RHINITIS, SEASONAL 02/20/2007  . Hyperlipidemia 10-03-06  . Essential hypertension 2006-10-03  . GERD 10-03-2006    PHYSICAL THERAPY DISCHARGE SUMMARY  Visits from Start of Care: 28  Current functional level related to goals / functional outcomes: Re-assessment performed today. While patient does appear to have made some subjective and objective improvements as compared to her status at initial evaluation, she continues to be severely limited by Parkinson's related gait deviations and occasional freezing, postural deficits, functional strength deficits, unsteadiness, and difficulty with functional mobility. Skilled PT services have completely resolved her complaints of R ankle pain however primary limitations appear to be resulting from Parkinson's Disease at this time. Have extensively worked on bed mobility over the course of the past few weeks however patient continues to struggle with this. She reports that she "does some sort of exercise every day" but when queried does not list specific exercises DPT gave her;  did not update HEP besides recommendation for senior center due to significant concerns over compliance and correct form. DC today from skilled PT services due to lack of progress.      Remaining deficits: Gait impairment, unsteadiness, functional weakness, poor posture, fall risk, impaired functional mobility    Education / Equipment: Progress thus far, DC today, recommendation for exercise at senior center  Plan: Patient agrees to discharge.  Patient goals were partially met. Patient is being discharged due to lack of progress.  ?????      Deniece Ree PT, DPT [place] [address] [place], [place], [address] Phone: [phone]   Fax:  [phone]  Name: Nicole Baxter MRN: [id number] Date of Birth: [birth year]

## 2016-09-27 NOTE — Patient Instructions (Signed)
Complete 10 reps each. 2-3 times a day.  SHOULDER: Flexion On Table   Place hands on table, elbows straight. Move hips away from body. Press hands down into table. Hold ___ seconds. ___ reps per set, ___ sets per day, ___ days per week  Abduction (Passive)   With arm out to side, resting on table, lower head toward arm, keeping trunk away from table. Hold ____ seconds. Repeat ____ times. Do ____ sessions per day.  Copyright  VHI. All rights reserved.     Internal Rotation (Assistive)   Seated with elbow bent at right angle and held against side, slide arm on table surface in an inward arc. Repeat ____ times. Do ____ sessions per day. Activity: Use this motion to brush crumbs off the table.  Copyright  VHI. All rights reserved.

## 2016-09-30 ENCOUNTER — Encounter (HOSPITAL_COMMUNITY): Payer: Self-pay | Admitting: Occupational Therapy

## 2016-09-30 ENCOUNTER — Ambulatory Visit (HOSPITAL_COMMUNITY): Payer: PPO | Attending: Internal Medicine | Admitting: Occupational Therapy

## 2016-09-30 DIAGNOSIS — M25512 Pain in left shoulder: Secondary | ICD-10-CM | POA: Insufficient documentation

## 2016-09-30 DIAGNOSIS — R29898 Other symptoms and signs involving the musculoskeletal system: Secondary | ICD-10-CM | POA: Diagnosis not present

## 2016-09-30 DIAGNOSIS — M25612 Stiffness of left shoulder, not elsewhere classified: Secondary | ICD-10-CM

## 2016-09-30 NOTE — Therapy (Signed)
[facility] [address] [place], [place], [address] Phone: [phone]   Fax:  [phone]  Occupational Therapy Treatment  Patient Details  Name: Nicole Baxter MRN: [id number] Date of Birth: 11/24/1941 Referring Provider: Tyson Aliasuncan Thomas Vincent, MD  Encounter Date: 09/30/2016      OT End of Session - 09/30/16 1624    Visit Number 2   Number of Visits 8   Date for OT Re-Evaluation 10/27/16   Authorization Type [employer] ($15 co pay)   Authorization Time Period before 10th visit   Authorization - Visit Number 2   Authorization - Number of Visits 10   OT Start Time 1115   OT Stop Time 1158   OT Time Calculation (min) 43 min   Activity Tolerance Patient tolerated treatment well   Behavior During Therapy [facility] for tasks assessed/performed      Past Medical History:  Diagnosis Date  . Arthritis   . GERD (gastroesophageal reflux disease)    pt. states she no longer has GERD (09/11/15)  . Hyperglycemia    isolated FBS 122, 95, 104 (Random 138)  . Hyperlipidemia    on pravastatin  . Hypertension    controlled on 2 agents  . 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 Obesity   . Parkinson's disease (tremor, stiffness, slow motion, unstable posture) ([facility])   . Shortness of breath dyspnea    pt. states that she no longer has SOB )09/11/15  . Tremor     Past Surgical History:  Procedure Laterality Date  . CESAREAN SECTION    . COLONOSCOPY    . TOTAL KNEE ARTHROPLASTY Left 09/23/2015   Procedure: LEFT TOTAL KNEE ARTHROPLASTY;  Surgeon: Loreta Aveaniel F Murphy, MD;  [facility];  Service: Orthopedics;  Laterality: Left;    There were no vitals filed for this visit.      Subjective Assessment - 09/30/16 1113    Subjective  S: Yesterday I was exercising and the pain went away.    Currently in Pain? No/denies            [facility] Assessment - 09/30/16 1112      Assessment   Diagnosis left shoulder pain     Precautions   Precautions Fall                  OT Treatments/Exercises (OP) - 09/30/16 1121      Bed Mobility   Supine to Sit 4: Min assist   Sit to Supine 3: Mod assist     Transfers   Sit to Stand 4: Min guard   Stand to Sit 5: Supervision     Exercises   Exercises Shoulder     Shoulder Exercises: Supine   Protraction PROM;AAROM;5 reps   Horizontal ABduction PROM;AAROM;5 reps   External Rotation PROM;AAROM;5 reps   Internal Rotation PROM;AAROM;5 reps   Flexion PROM;AAROM;5 reps   ABduction PROM;5 reps     Functional Reaching Activities   Mid Level Pt performed functional reaching using LUE, reaching into protraction, flexion, horizontal abduction and horizontal adduction. Pt demonstrates ability to reach into flexion to 60% range, mod difficulty reaching into all planes due to weakness.      Manual Therapy   Manual Therapy Myofascial release   Manual therapy comments Manual therapy performed separately from all other skilled services   Myofascial Release Myofascial release to left upper arm, trapezius, and scapularis regions to decrease pain and fascial restriction  and increase joint range of motion.                   OT Short Term Goals - 09/30/16 1626      OT SHORT TERM GOAL #1   Title Patient will be educated and able to complete HEP and/or functional mobility tasks with supervision or min assist if needed.    Time 4   Period Weeks   Status On-going     OT SHORT TERM GOAL #2   Title Patient will increase A/ROM to [facility] to increase ability to reaching at shoulder level or higher during functional tasks.    Time 4   Period Weeks   Status On-going     OT SHORT TERM GOAL #3   Title Patient will report a pain level that does not increase above 1/10 during daily tasks.    Time 4   Period Weeks   Status On-going     OT SHORT TERM GOAL #4   Title Patient will increase LUE strength to 3/5 to increase ability position self comfortably on left  side with less difficulty at night.   Time 4   Period Weeks   Status On-going     OT SHORT TERM GOAL #5   Title Patient will decrease fascial restrictions in LUE to min amount or less to increase abilty to complete functional reaching tasks with less difficulty.    Time 4   Period Weeks   Status On-going                  Plan - 09/30/16 1624    Clinical Impression Statement A: Initiated myofascial release, manual stretching, AA/ROM, and functional reaching tasks this session. Pt requires min tactile assist intermittently with supine AA/ROM exercises due to fatigue. Verbal cuing required for form/technique.    Plan P: Continue with functional reaching into protraction, flexion, and add PNF patterns   OT Home Exercise Plan 10/31: table slides   Consulted and Agree with Plan of Care Patient      Patient will benefit from skilled therapeutic intervention in order to improve the following deficits and impairments:  Decreased strength, Pain, Decreased range of motion, Impaired UE functional use, Increased fascial restricitons  Visit Diagnosis: Other symptoms and signs involving the musculoskeletal system  Stiffness of left shoulder, not elsewhere classified  Acute pain of left shoulder    Problem List Patient Active Problem List   Diagnosis Date Noted  . Urine discoloration 08/16/2016  . Left shoulder pain 08/16/2016  . Achilles tendinitis of right lower extremity 04/05/2016  . Hypertensive retinopathy of both eyes, grade 2 01/15/2016  . Left leg DVT ([facility]) 12/18/2015  . 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 Internal hemorrhoids 03/10/2015  . History of colon polyps 12/25/2014  . Health care maintenance 02/13/2014  . Subclinical hyperthyroidism 12/25/2013  . Right renal mass 06/11/2012  . Osteoarthritis of both knees 03/15/2012  . Parkinsonism ([facility]) 07/13/2010  . Diabetes mellitus type 2, diet-controlled ([facility]) 05/22/2009  . ALLERGIC  RHINITIS, SEASONAL 02/20/2007  . Hyperlipidemia 09/27/2006  . Essential hypertension 09/27/2006  . GERD 09/27/2006   Ezra SitesLeslie Lydiann Bonifas, OTR/L  [phone] 09/30/2016, 4:27 PM  [facility] [address] [place], [place], [address] Phone: [phone]   Fax:  [phone]  Name: Nicole Baxter MRN: [id number] Date of Birth: 10/22/1941

## 2016-10-05 ENCOUNTER — Encounter (HOSPITAL_COMMUNITY): Payer: Self-pay | Admitting: Occupational Therapy

## 2016-10-05 ENCOUNTER — Telehealth: Payer: Self-pay

## 2016-10-05 ENCOUNTER — Ambulatory Visit (HOSPITAL_COMMUNITY): Payer: PPO | Admitting: Occupational Therapy

## 2016-10-05 DIAGNOSIS — R29898 Other symptoms and signs involving the musculoskeletal system: Secondary | ICD-10-CM

## 2016-10-05 DIAGNOSIS — M25512 Pain in left shoulder: Secondary | ICD-10-CM

## 2016-10-05 DIAGNOSIS — M25612 Stiffness of left shoulder, not elsewhere classified: Secondary | ICD-10-CM

## 2016-10-05 NOTE — Therapy (Signed)
Nicole Baxter - [facility] [address] [place], [place], [address] Phone: [phone]   Fax:  [phone]  Occupational Therapy Treatment  Patient Details  Name: Nicole Baxter MRN: [id number] Date of Birth: 03/07/1941 Referring Provider: Tyson Aliasuncan Thomas Vincent, MD  Encounter Date: 10/05/2016      OT End of Session - 10/05/16 1407    Visit Number 3   Number of Visits 8   Date for OT Re-Evaluation 10/27/16   Authorization Type [employer] ($15 co pay)   Authorization Time Period before 10th visit   Authorization - Visit Number 3   Authorization - Number of Visits 10   OT Start Time 1258   OT Stop Time 1344   OT Time Calculation (min) 46 min   Activity Tolerance Patient tolerated treatment well   Behavior During Therapy [facility] for tasks assessed/performed      Past Medical History:  Diagnosis Date  . Arthritis   . GERD (gastroesophageal reflux disease)    pt. states she no longer has GERD (09/11/15)  . Hyperglycemia    isolated FBS 122, 95, 104 (Random 138)  . Hyperlipidemia    on pravastatin  . Hypertension    controlled on 2 agents  . 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 Obesity   . Parkinson's disease (tremor, stiffness, slow motion, unstable posture) (HCC)   . Shortness of breath dyspnea    pt. states that she no longer has SOB )09/11/15  . Tremor     Past Surgical History:  Procedure Laterality Date  . CESAREAN SECTION    . COLONOSCOPY    . TOTAL KNEE ARTHROPLASTY Left 09/23/2015   Procedure: LEFT TOTAL KNEE ARTHROPLASTY;  Surgeon: Loreta Aveaniel F Murphy, MD;  [facility];  Service: Orthopedics;  Laterality: Left;    There were no vitals filed for this visit.      Subjective Assessment - 10/05/16 1254    Subjective  S: I had to massage it one day to get it to stop hurting.   Currently in Pain? No/denies            [facility] - 10/05/16 1253      Assessment   Diagnosis left shoulder pain      Precautions   Precautions Fall                  OT Treatments/Exercises (OP) - 10/05/16 1308      Bed Mobility   Supine to Sit 4: Min assist   Sit to Supine 3: Mod assist     Transfers   Sit to Stand 4: Min guard   Stand to Sit 5: Supervision     Exercises   Exercises Shoulder     Shoulder Exercises: Supine   Protraction PROM;AAROM;5 reps   Protraction Limitations OT assisting due to right arm pain   External Rotation PROM;AAROM;5 reps   Internal Rotation PROM;AAROM;5 reps   Flexion PROM;AAROM;5 reps   Flexion Limitations OT assisting due to right arm pain   ABduction PROM;5 reps     Shoulder Exercises: Seated   Elevation AROM;5 reps   Row AROM;5 reps   Other Seated Exercises PVC pipe slide, 5X, mod difficulty, min assist from OT to reach end range     Functional Reaching Activities   Mid Level Pt performed functional reaching using LUE, reaching into protraction, flexion, horizontal abduction and horizontal adduction, as well as PNF patterns. Pt demonstrates ability to reach  into flexion to 60% range, mod difficulty reaching into all planes due to weakness. Pt reached for 5 cones in each plane.      Manual Therapy   Manual Therapy Myofascial release   Manual therapy comments Manual therapy performed separately from all other skilled services   Myofascial Release Myofascial release to left upper arm, trapezius, and scapularis regions to decrease pain and fascial restriction and increase joint range of motion.                   OT Short Term Goals - 09/30/16 1626      OT SHORT TERM GOAL #1   Title Patient will be educated and able to complete HEP and/or functional mobility tasks with supervision or min assist if needed.    Time 4   Period Weeks   Status On-going     OT SHORT TERM GOAL #2   Title Patient will increase A/ROM to [facility] to increase ability to reaching at shoulder level or higher during functional tasks.    Time 4   Period Weeks    Status On-going     OT SHORT TERM GOAL #3   Title Patient will report a pain level that does not increase above 1/10 during daily tasks.    Time 4   Period Weeks   Status On-going     OT SHORT TERM GOAL #4   Title Patient will increase LUE strength to 3/5 to increase ability position self comfortably on left side with less difficulty at night.   Time 4   Period Weeks   Status On-going     OT SHORT TERM GOAL #5   Title Patient will decrease fascial restrictions in LUE to min amount or less to increase abilty to complete functional reaching tasks with less difficulty.    Time 4   Period Weeks   Status On-going                  Plan - 10/05/16 1407    Clinical Impression Statement A: Added PNF patterns to functional reaching this session, continued with protraction, flexion, and horizontal abduction. Pt able to perform AA/ROM with LUE only, min assist due to weakness and fatigue. Pt requires verbal and tactile cuing for form and technique.    Plan P: Add therapy ball stretches, continue with functional reaching tasks focusing on big movement patterns   OT Home Exercise Plan 10/31: table slides   Consulted and Agree with Plan of Care Patient      Patient will benefit from skilled therapeutic intervention in order to improve the following deficits and impairments:  Decreased strength, Pain, Decreased range of motion, Impaired UE functional use, Increased fascial restricitons  Visit Diagnosis: Other symptoms and signs involving the musculoskeletal system  Stiffness of left shoulder, not elsewhere classified  Acute pain of left shoulder    Problem List Patient Active Problem List   Diagnosis Date Noted  . Urine discoloration 08/16/2016  . Left shoulder pain 08/16/2016  . Achilles tendinitis of right lower extremity 04/05/2016  . Hypertensive retinopathy of both eyes, grade 2 01/15/2016  . Left leg DVT (HCC) 12/18/2015  . 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 Internal hemorrhoids 03/10/2015  . History of colon polyps 12/25/2014  . Health care maintenance 02/13/2014  . Subclinical hyperthyroidism 12/25/2013  . Right renal mass 06/11/2012  . Osteoarthritis of both knees 03/15/2012  . Parkinsonism (HCC) 07/13/2010  . Diabetes mellitus type 2, diet-controlled (HCC)  05/22/2009  . ALLERGIC RHINITIS, SEASONAL 02/20/2007  . Hyperlipidemia 09/27/2006  . Essential hypertension 09/27/2006  . GERD 09/27/2006   Ezra SitesLeslie Natividad Schlosser, OTR/L  [phone] 10/05/2016, 2:10 PM  [facility] [address] [place], [place], [address] Phone: [phone]   Fax:  [phone]  Name: Nicole Baxter MRN: [id number] Date of Birth: 03/13/1941

## 2016-10-06 ENCOUNTER — Ambulatory Visit (INDEPENDENT_AMBULATORY_CARE_PROVIDER_SITE_OTHER): Payer: PPO | Admitting: Neurology

## 2016-10-06 DIAGNOSIS — F0280 Dementia in other diseases classified elsewhere without behavioral disturbance: Secondary | ICD-10-CM | POA: Diagnosis not present

## 2016-10-06 DIAGNOSIS — G20 Parkinson's disease: Secondary | ICD-10-CM | POA: Diagnosis not present

## 2016-10-06 MED ORDER — CYANOCOBALAMIN 1000 MCG PO TABS: 1000.0000 ug | ORAL_TABLET | Freq: Every day | ORAL | 1 refills | Status: DC

## 2016-10-06 NOTE — Progress Notes (Signed)
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  She has a past medical history of recently diagnosed hyperthyroidism(treated, last tsh wnl), essential tremor, hypertension, asthma, hyperlipidemia, diabetes, osteoarthritis. No history of dopamine blocking agents. 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 Sinemet helps her resting tremor however she forgets to take it.  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 Patient has not been noncompliant with medications or directions. Today fortunately she is here with her daughter who is going to be more involved. Reviewed with daughter that I do feel that carbidopa/levodopa is the best medication for patient unfortunately in the past she would take 1 pill a day or stop taking it all together even though she reported it would help when taking it. Reviewed the history of medications tried with mother and discussed Parkinson's disease. We'll try Sinemet again, discussed with daughter that she should be involved with patient's medication management, she should set up a pill dispenser and also visit multiple times a week or hire somebody to manage daily compliance. Patient is a fall risk and this is a concern.  Interval history 02/01/2016: She feels the medication is helping. She has not taken the Sinemet since yesterday. When she takes the sinemet the tremor improves. She takes it 3x  a day. Here with husband. Giving ut to her at 10am, 1pm and 8 pm. Advised to take it every 4 hours. Wears off in 2-3 hours. Discussed trying a patch every day to see if that helps with medication administration. When she takes the medication the tremor improves and then it wears off in 3 hours. No falls. She forgets to take the medication. She is having some hallucinations. She was seeing people that were not there and babies not there. Her memory is stable. She has drooling. Drool on her pillow and sometimes during the day and it starts dripping. No difficulty swallowing. No choking on foods. No falls. Can continue to take the Sinemet.   Interval History 11/03/2015: here for follow up of Parkinsonism. She says she ran out of the prescription a few days ago and she has not taken the Sinemet in a few days. It really helps when she takes it. When she takes the Sinemet, the tremors stop. No side effects from the Sinemet. She feels the Sinemet lasts until the next dose. She has memory loss. We had ordered an MRI of the brain and she tried to do it but got claustrophobic. We need to get an MRi of the brain due to parkinsonism and memory loss. Will order and give some Xanax before going in to an open MRI. She has a manual wheelchair, we tried to get her a power wheelchair but it was too expensive. She feels much improved since being discharged from the hospital and starting B12 injections. B12 was 185 and she is having injections. Last thyroid was normal. Will do a MoCA today and start Rivastigmine for memory loss. Denies any confusion or altered mentation recently. They take the sinemet at  9am then 3or 4pm then 9pm. Discussed should take it every 4 hours. 9am, 1pm and 5pm. Do not take it with protein. No falls. No problems swallowing, not choking on food. No dyskinesias.  Addendum 07/22/2015: Called to see how she is doing. She hasn't taken the Sinemet since yesterday. Not taking it three times a day as prescribed.  Sounds like it is difficullt for patient to remember to take her medication. She does say when she takes the medication it helps.   Interval update 06/24/2015: They are taking 1/2 pill of the sinemet with breakfast. She forgot she is not supposed to take it with food. It helps the tremor. No side effects from the Sinemet. Will increase the sinemet to a whole pill three times a day. She did not take her sinemet this morning. Symptoms brain they're Sinemet with some the next time they come, we can take it in clinic and evaluate response. But if the Sinemet appears to help with her tremor, and this confirms the diagnosis of Parkinson's. May consider adding Azilect concurrently.  Interval update 05/12/2014: She had the radioactive iodine treatment and tremor is much improved. She still has bilateral upper resting tremor. She says she is walking a little better but husband disagrees. Husband says her knee hurts and stays sore. Husband feels her voice volume is lower and he can't hear her. She shuffles. Her sense of smell is poor. She is off balance.   We'll start Sinemet low dose. They are to call me back in 2 weeks to discuss how patient is responding. Discussed common side effects including nausea and GI side effects, orthostatic hypotension, dizziness and increased risk of falls. They are to stop the medication if significant side effects occur.   04/14/2015: Nicole Baxter is a 75 y.o. female here as a referral from Dr. Andrey CampanileWilson for tremor. She has a past medical history of recently diagnosed hyperthyroidism. She is here with her husband who provides much of the information. She also has a past medical history of essential tremor, hypertension, asthma, hyperlipidemia, asthma, diabetes, osteoarthritis. No history of dopamine blocking agents. Recent follow up with internal medicine documented either nodular Graves' disease or toxic multinodular goiter and she is currently being followed for this and  radioactive iodine treatment is planned.  She has a daily tremor. She had a tremor in the past, similar tremor but it went away. It came back last year. She has good days and bad days with the tremor. Her right leg sometimes won't move. There are some days when it is better. She shuffles and is slow walking. She can't move when walking sometimes, she freezes. The tone of her voice is lower, husband says she used to talk much louder. She doesn't yell anymore. She denies decreased smell sensation. She has constipation. Her hand writing is terrible. She is able to eat with her tremor. Tremor is better with movement. Worse at rest. Answer legs shake, feels like nervousness. Mother has a tremor. Cousins have tremors so they think it is a family issue. She is hyperthyroid and is going to [facility]. No FHx of PD. She uses a walker at baseline. Walking has worsened.  Reviewed notes, labs and imaging from outside physicians, which showed: Notes state the patient has been seen by neurology in the past however I cannot find a note in Epic. She follows with Dr. Andrey CampanileWilson who notes resting tremor, rigidity and bradykinesia concerning for Parkinson's disease. She was started on gabapentin 100  mg 3 times a day and referred to neurology. Notes state that years ago she was seen by neurologist and advised to take Benadryl for tremor. Dr. Andrey Campanile advised her to stop due to risk of anticholinergic symptoms in the elderly. TSH decreased 0.132 weeks ago, liver function tests with elevated AST and a LT, BMP unremarkable  Review of Systems: Patient complains of symptoms per HPI as well as the following symptoms: joint pain, nack pain, neck pain, no CP, no SOB. Pertinent negatives per HPI. All others negative.  Social History   Social History  . Marital status: Married    Spouse name: N/A  . Number of children: 5  . Years of education: 12   Occupational History  . Not on file.   Social History Main Topics    . Smoking status: Never Smoker  . Smokeless tobacco: Never Used  . Alcohol use No  . Drug use: No  . Sexual activity: No   Other Topics Concern  . Not on file   Social History Narrative   Worked at [employer] 31 years. Married.Active, out and about q day.   5 children, 1 with sarcoidosis, 1 with COPD.   Caffeine use: Drinks a 6 pack of soda per week    Family History  Problem Relation Age of Onset  . Heart disease Mother 16    died of MI @ 18  . Alcohol abuse Father   . Cancer Brother   . Colon cancer Neg Hx     Past Medical History:  Diagnosis Date  . Arthritis   . GERD (gastroesophageal reflux disease)    pt. states she no longer has GERD (09/11/15)  . Hyperglycemia    isolated FBS 122, 95, 104 (Random 138)  . Hyperlipidemia    on pravastatin  . Hypertension    controlled on 2 agents  . 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 Obesity   . Parkinson's disease (tremor, stiffness, slow motion, unstable posture) (HCC)   . Shortness of breath dyspnea    pt. states that she no longer has SOB )09/11/15  . Tremor     Past Surgical History:  Procedure Laterality Date  . CESAREAN SECTION    . COLONOSCOPY    . TOTAL KNEE ARTHROPLASTY Left 09/23/2015   Procedure: LEFT TOTAL KNEE ARTHROPLASTY;  Surgeon: Loreta Ave, MD;  Location: [facility] OR;  Service: Orthopedics;  Laterality: Left;    Current Outpatient Prescriptions  Medication Sig Dispense Refill  . albuterol (PROVENTIL HFA;VENTOLIN HFA) 108 (90 Base) MCG/ACT inhaler Inhale 2 puffs into the lungs every 6 (six) hours as needed for wheezing or shortness of breath. 1 Inhaler 2  . apixaban (ELIQUIS) 5 MG TABS tablet Take 1 tablet (5 mg total) by mouth 2 (two) times daily. To start 03/29/16. BIN [id number] GRP [id number] PCN [id number] ID [id number] 60 tablet 2  . atorvastatin (LIPITOR) 40 MG tablet Take 1 tablet (40 mg total) by mouth daily. Patient requested easy open caps for all medication bottles. 30 tablet 3  .  Calcium Carbonate-Vitamin D (CALCIUM 600/VITAMIN D) 600-400 MG-UNIT tablet Take 2 tablets by mouth daily. 180 tablet 1  . carbidopa-levodopa (SINEMET) 25-100 MG tablet Take 1 tablet by mouth 3 (three) times daily. Take 4 hours apart. 270 tablet 4  . diclofenac sodium (VOLTAREN) 1 % GEL Apply small amount to RIGHT achilles tendon area three times a day. 100 g 1  . lisinopril (PRINIVIL,ZESTRIL) 20 MG tablet TAKE ONE  TABLET BY MOUTH ONCE DAILY 90 tablet 1  . rivastigmine (EXELON) 4.5 MG capsule Take 1 capsule (4.5 mg total) by mouth 2 (two) times daily. 60 capsule 11  . cyanocobalamin ([employer] VITAMIN B12) 1000 MCG tablet Take 1 tablet (1,000 mcg total) by mouth daily. 30 tablet 1   No current facility-administered medications for this visit.     Allergies as of 10/06/2016  . (No Known Allergies)    Vitals: BP (!) 148/92 (BP Location: Right Arm, Patient Position: Sitting, Cuff Size: Normal)   Pulse 76   Ht 5' 1.5" (1.562 m)   Wt 138 lb 12.8 oz (63 kg)   BMI 25.80 kg/m  Last Weight:  Wt Readings from Last 1 Encounters:  10/06/16 138 lb 12.8 oz (63 kg)   Last Height:   Ht Readings from Last 1 Encounters:  10/06/16 5' 1.5" (1.562 m)     Coordination: bradykinesia and dec. amplitude on finger taps  Gait: freezing, shuffling, en-bloc turning. Decreased arm swing, re-emergent tremor. Bradykinesia.     Motor Observation:  Resting tremor in the bilateral upper extremities.  Tone:  Increased tone in the upper extremities  Posture:  stooped   Strength:  Strength is intact in the upper and lower limbs.    Sensation: intact to LT      Assessment/Plan: Nicole Baxter is a 75 y.o. female here as a  For f/u of Parkinson's disease. Patient and husband are here today with daughter. 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 Patient has not been  noncompliant with medications or directions. Today fortunately she is here with her daughter who is going to be more involved. Patient does report freezing, shuffling of gait, resting tremor, hypophonia, impaired smell. Exam does show bradykinesia on finger taps and walking, freezing, en-bloc turning, increased tone in the upper extremities and resting tremor in upper extremities.   Will order Home health. Patient should have the case manager and a nurse come out to help with medication management and compliance. Given her Parkinson's disease and dementia she cannot leave the house unassisted.  Will continue Sinemet to 1 pill 3x a day. Asked husband and daughter to pay attention when she takes the pill and see if tremor improves, how much it improves, and when the Sinemet starts wearing off. Watch for side effects. Take it 4 hours apart such as 9am, 1pm and 5pm. Do not take with protein, take 30 minutes before or an hour after eating protein.   She is in physical therapy currently. Taking PO B12. Her thyroid appears to be normalized, had hyperthyroidism.   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 Continue Rivastigmine 4.5mg  twice daily.  Discussed side effects: Nausea, vomiting, diarrhea, loss of appetite/weight loss, dizziness, drowsiness, weakness, trouble sleeping, shakiness (tremor), or muscle cramps. More serious side effects can include AV block, bradycardia, syncope, seizures, GI bleeding, rash.  Needs follow up in 3 months with me. Asked daughter to attend all appts.  Discussed parkinson's disease vs Parkinsonism, what it is and the symptoms. Discussed that Parkinson's disease is a neurodegenerative disease which involves dopamine in the brain. Progression is variable. People with Parkinson's can display tremor, shuffling, stooped posture, impaired balance and falls, lowered voice volume, constipation, decreased smell sensation. Risk of falls is especially problematic  and vigilance as needed. Patient should always use her walker. Provided patient and daughter handout on Parkinson's disorder,  online organizations as well as Parkinson's support group that meets here in [place] with contact information.   Naomie Dean, MD  [facility] [address] [place], [place] [address]  Phone [phone] Fax [phone]  A total of 40 minutes was spent face-to-face with this patient. Over half this time was spent on counseling patient on the Parkinsonism diagnosis and different diagnostic and therapeutic options available.

## 2016-10-06 NOTE — Patient Instructions (Addendum)
Remember to drink plenty of fluid, eat healthy meals and do not skip any meals. Try to eat protein with a every meal and eat a healthy snack such as fruit or nuts in between meals. Try to keep a regular sleep-wake schedule and try to exercise daily, particularly in the form of walking, 20-30 minutes a day, if you can.   As far as your medications are concerned, I would like to suggest: Carbidopa/Levodopa (Sinemet) one pill three times daily. Try to separate Sinemet from food (especially protein-rich foods like meat, dairy, eggs) by about 30-60 mins - this will help the absorption of the medication. If you have some nausea with the medication, you can take it with some light food like crackers or ginger ale. Take it every 3-4 hours for example 8am, noon and 4pm.   I would like to see you back in 3-4 months, sooner if we need to. Please call us with any interim questions, concerns, problems, updates or refill requests.   Our phone number is [phone]. We also have an after hours call service for urgent matters and there is a physician on-call for urgent questions. For any emergencies you know to call 911 or go to the nearest emergency room

## 2016-10-07 ENCOUNTER — Encounter (HOSPITAL_COMMUNITY): Payer: Self-pay | Admitting: Occupational Therapy

## 2016-10-07 ENCOUNTER — Ambulatory Visit (HOSPITAL_COMMUNITY): Payer: PPO | Admitting: Occupational Therapy

## 2016-10-07 DIAGNOSIS — M25512 Pain in left shoulder: Secondary | ICD-10-CM

## 2016-10-07 DIAGNOSIS — R29898 Other symptoms and signs involving the musculoskeletal system: Secondary | ICD-10-CM | POA: Diagnosis not present

## 2016-10-07 DIAGNOSIS — M25612 Stiffness of left shoulder, not elsewhere classified: Secondary | ICD-10-CM

## 2016-10-07 NOTE — Therapy (Signed)
[facility] [address] [place], [place], [address] Phone: [phone]   Fax:  [phone]  Occupational Therapy Treatment  Patient Details  Name: Nicole Baxter MRN: [id number] Date of Birth: 08/07/1941 Referring Provider: Tyson Aliasuncan Thomas Vincent, MD  Encounter Date: 10/07/2016      OT End of Session - 10/07/16 1218    Visit Number 4   Number of Visits 8   Date for OT Re-Evaluation 10/27/16   Authorization Type [employer] ($15 co pay)   Authorization Time Period before 10th visit   Authorization - Visit Number 4   Authorization - Number of Visits 10   OT Start Time 1117   OT Stop Time 1203   OT Time Calculation (min) 46 min   Activity Tolerance Patient tolerated treatment well   Behavior During Therapy [facility] for tasks assessed/performed      Past Medical History:  Diagnosis Date  . Arthritis   . GERD (gastroesophageal reflux disease)    pt. states she no longer has GERD (09/11/15)  . Hyperglycemia    isolated FBS 122, 95, 104 (Random 138)  . Hyperlipidemia    on pravastatin  . Hypertension    controlled on 2 agents  . 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 Obesity   . Parkinson's disease (tremor, stiffness, slow motion, unstable posture) (HCC)   . Shortness of breath dyspnea    pt. states that she no longer has SOB )09/11/15  . Tremor     Past Surgical History:  Procedure Laterality Date  . CESAREAN SECTION    . COLONOSCOPY    . TOTAL KNEE ARTHROPLASTY Left 09/23/2015   Procedure: LEFT TOTAL KNEE ARTHROPLASTY;  Surgeon: Loreta Aveaniel F Murphy, MD;  [facility];  Service: Orthopedics;  Laterality: Left;    There were no vitals filed for this visit.      Subjective Assessment - 10/07/16 1113    Subjective  S: It's only a little bit sore at times.    Currently in Pain? No/denies            [facility] - 10/07/16 1111      Assessment   Diagnosis left shoulder pain     Precautions   Precautions Fall                  OT Treatments/Exercises (OP) - 10/07/16 1125      Bed Mobility   Supine to Sit 4: Min assist   Sit to Supine 3: Mod assist     Transfers   Sit to Stand 4: Min guard   Stand to Sit 5: Supervision     Exercises   Exercises Shoulder     Shoulder Exercises: Supine   Protraction PROM;AAROM;5 reps   Protraction Limitations OT assisting due to right arm pain   Horizontal ABduction PROM;5 reps   External Rotation PROM;5 reps   Internal Rotation PROM;5 reps   Flexion PROM;AAROM;5 reps   Flexion Limitations OT assisting due to right arm pain   ABduction PROM;5 reps     Shoulder Exercises: Therapy Ball   Flexion 10 reps     Functional Reaching Activities   Mid Level Pt completed functional reaching with resistive clothespin activity. Pt placed 5 clothespins on middle and top horizontal bar using LUE, focusing on reaching into protraction and horizontal abduction. Pt required increased time, mod difficulty for top horizontal bar.      Manual Therapy  Manual Therapy Myofascial release   Manual therapy comments Manual therapy performed separately from all other skilled services   Myofascial Release Myofascial release to left upper arm, trapezius, and scapularis regions to decrease pain and fascial restriction and increase joint range of motion.                   OT Short Term Goals - 09/30/16 1626      OT SHORT TERM GOAL #1   Title Patient will be educated and able to complete HEP and/or functional mobility tasks with supervision or min assist if needed.    Time 4   Period Weeks   Status On-going     OT SHORT TERM GOAL #2   Title Patient will increase A/ROM to [facility] to increase ability to reaching at shoulder level or higher during functional tasks.    Time 4   Period Weeks   Status On-going     OT SHORT TERM GOAL #3   Title Patient will report a pain level that does not increase above 1/10 during daily  tasks.    Time 4   Period Weeks   Status On-going     OT SHORT TERM GOAL #4   Title Patient will increase LUE strength to 3/5 to increase ability position self comfortably on left side with less difficulty at night.   Time 4   Period Weeks   Status On-going     OT SHORT TERM GOAL #5   Title Patient will decrease fascial restrictions in LUE to min amount or less to increase abilty to complete functional reaching tasks with less difficulty.    Time 4   Period Weeks   Status On-going                  Plan - 10/07/16 1218    Clinical Impression Statement A: Added therapy ball stretch in flexion, functional reaching with resistive clothespin activity. Pt demonstrates slight improvement in independence in AA/ROM, OT continues to provide assistance with off loading weight. Pt requires increased time for all tasks, verbal cuing for form.    Plan P: Continue with functional reaching focusing on large movement patterns   OT Home Exercise Plan 10/31: table slides      Patient will benefit from skilled therapeutic intervention in order to improve the following deficits and impairments:  Decreased strength, Pain, Decreased range of motion, Impaired UE functional use, Increased fascial restricitons  Visit Diagnosis: Other symptoms and signs involving the musculoskeletal system  Stiffness of left shoulder, not elsewhere classified  Acute pain of left shoulder    Problem List Patient Active Problem List   Diagnosis Date Noted  . Urine discoloration 08/16/2016  . Left shoulder pain 08/16/2016  . Achilles tendinitis of right lower extremity 04/05/2016  . Hypertensive retinopathy of both eyes, grade 2 01/15/2016  . Left leg DVT (HCC) 12/18/2015  . 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 Internal hemorrhoids 03/10/2015  . History of colon polyps 12/25/2014  . Health care maintenance 02/13/2014  . Subclinical hyperthyroidism 12/25/2013  . Right renal  mass 06/11/2012  . Osteoarthritis of both knees 03/15/2012  . Parkinsonism (HCC) 07/13/2010  . Diabetes mellitus type 2, diet-controlled (HCC) 05/22/2009  . ALLERGIC RHINITIS, SEASONAL 02/20/2007  . Hyperlipidemia 09/27/2006  . Essential hypertension 09/27/2006  . GERD 09/27/2006   Ezra SitesLeslie Troxler, OTR/L  [phone] 10/07/2016, 12:20 PM  [facility] [address] [place], [place], [address] Phone:  [phone]   Fax:  [phone]  Name: Nicole Baxter MRN: [id number] Date of Birth: 1941/07/31

## 2016-10-08 ENCOUNTER — Encounter: Payer: Self-pay | Admitting: Neurology

## 2016-10-08 DIAGNOSIS — G20A1 Parkinson's disease without dyskinesia, without mention of fluctuations: Secondary | ICD-10-CM | POA: Insufficient documentation

## 2016-10-08 DIAGNOSIS — G20 Parkinson's disease: Secondary | ICD-10-CM | POA: Insufficient documentation

## 2016-10-08 DIAGNOSIS — F0280 Dementia in other diseases classified elsewhere without behavioral disturbance: Secondary | ICD-10-CM | POA: Insufficient documentation

## 2016-10-09 NOTE — Telephone Encounter (Signed)
Unable to reach patient.

## 2016-10-10 ENCOUNTER — Telehealth: Payer: Self-pay | Admitting: *Deleted

## 2016-10-10 NOTE — Telephone Encounter (Signed)
Called and spoke to pt pharmacy. Spoke with Brett CanalesSteve. He cancelled any rx neupro patch that was on file for pt per AA,MD request.

## 2016-10-11 ENCOUNTER — Ambulatory Visit: Payer: PPO | Admitting: Neurology

## 2016-10-12 ENCOUNTER — Ambulatory Visit (HOSPITAL_COMMUNITY): Payer: PPO | Admitting: Occupational Therapy

## 2016-10-12 ENCOUNTER — Telehealth (HOSPITAL_COMMUNITY): Payer: Self-pay | Admitting: Internal Medicine

## 2016-10-12 NOTE — Telephone Encounter (Signed)
10/12/16 daughter cx because her step dad had surgery today and Mrs. Louisa doesn't want to leave the hosptial

## 2016-10-14 ENCOUNTER — Encounter (HOSPITAL_COMMUNITY): Payer: Self-pay | Admitting: Occupational Therapy

## 2016-10-14 ENCOUNTER — Ambulatory Visit (HOSPITAL_COMMUNITY): Payer: PPO | Admitting: Occupational Therapy

## 2016-10-14 DIAGNOSIS — R29898 Other symptoms and signs involving the musculoskeletal system: Secondary | ICD-10-CM | POA: Diagnosis not present

## 2016-10-14 DIAGNOSIS — M25612 Stiffness of left shoulder, not elsewhere classified: Secondary | ICD-10-CM

## 2016-10-14 DIAGNOSIS — M25512 Pain in left shoulder: Secondary | ICD-10-CM

## 2016-10-14 NOTE — Therapy (Signed)
[facility] [address] [place], [place], [address] Phone: [phone]   Fax:  [phone]  Occupational Therapy Treatment  Patient Details  Name: Nicole Baxter MRN: [id number] Date of Birth: 09/09/1941 Referring Provider: Tyson Aliasuncan Thomas Vincent, MD  Encounter Date: 10/14/2016      OT End of Session - 10/14/16 1134    Visit Number 5   Number of Visits 8   Date for OT Re-Evaluation 10/27/16   Authorization Type [employer] ($15 co pay)   Authorization Time Period before 10th visit   Authorization - Visit Number 5   Authorization - Number of Visits 10   OT Start Time 1045   OT Stop Time 1129   OT Time Calculation (min) 44 min   Activity Tolerance Patient tolerated treatment well   Behavior During Therapy [facility] for tasks assessed/performed      Past Medical History:  Diagnosis Date  . Arthritis   . GERD (gastroesophageal reflux disease)    pt. states she no longer has GERD (09/11/15)  . Hyperglycemia    isolated FBS 122, 95, 104 (Random 138)  . Hyperlipidemia    on pravastatin  . Hypertension    controlled on 2 agents  . 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 Obesity   . Parkinson's disease (tremor, stiffness, slow motion, unstable posture) (HCC)   . Shortness of breath dyspnea    pt. states that she no longer has SOB )09/11/15  . Tremor     Past Surgical History:  Procedure Laterality Date  . CESAREAN SECTION    . COLONOSCOPY    . TOTAL KNEE ARTHROPLASTY Left 09/23/2015   Procedure: LEFT TOTAL KNEE ARTHROPLASTY;  Surgeon: Loreta Aveaniel F Murphy, MD;  [facility];  Service: Orthopedics;  Laterality: Left;    There were no vitals filed for this visit.      Subjective Assessment - 10/14/16 1050    Subjective  S: It's been feeling pretty good.    Currently in Pain? No/denies            [facility] Assessment - 10/14/16 1049      Assessment   Diagnosis left shoulder pain     Precautions   Precautions Fall                  OT Treatments/Exercises (OP) - 10/14/16 1050      Bed Mobility   Supine to Sit 4: Min assist   Sit to Supine 3: Mod assist     Transfers   Sit to Stand 4: Min guard   Stand to Sit 5: Supervision     Exercises   Exercises Shoulder     Shoulder Exercises: Supine   Protraction PROM;AAROM;5 reps   Horizontal ABduction PROM;5 reps   External Rotation PROM;AAROM;5 reps   Internal Rotation PROM;AAROM;5 reps   Flexion PROM;AAROM;5 reps   Flexion Limitations OT providing min assist due to weakness   ABduction PROM;5 reps     Shoulder Exercises: Seated   Elevation AROM;10 reps   Row AROM;10 reps   Protraction AAROM;5 reps   Other Seated Exercises PVC pipe slide, 10X, mod difficulty     Shoulder Exercises: Therapy Ball   Flexion 10 reps   ABduction 10 reps     Functional Reaching Activities   Mid Level Pt completed functional reaching with cones. Pt was positioned in front of shelves, reaching down to left to grasp cone then placing on  shelf at waist and shoulder height. Pt stacked cones on shelves. Pt able to stack all cones at both heights, 5 cones placed at each level.      Manual Therapy   Manual Therapy Myofascial release   Manual therapy comments Manual therapy performed separately from all other skilled services   Myofascial Release Myofascial release to left upper arm, trapezius, and scapularis regions to decrease pain and fascial restriction and increase joint range of motion.                   OT Short Term Goals - 09/30/16 1626      OT SHORT TERM GOAL #1   Title Patient will be educated and able to complete HEP and/or functional mobility tasks with supervision or min assist if needed.    Time 4   Period Weeks   Status On-going     OT SHORT TERM GOAL #2   Title Patient will increase A/ROM to [facility] to increase ability to reaching at shoulder level or higher during functional tasks.    Time 4   Period Weeks    Status On-going     OT SHORT TERM GOAL #3   Title Patient will report a pain level that does not increase above 1/10 during daily tasks.    Time 4   Period Weeks   Status On-going     OT SHORT TERM GOAL #4   Title Patient will increase LUE strength to 3/5 to increase ability position self comfortably on left side with less difficulty at night.   Time 4   Period Weeks   Status On-going     OT SHORT TERM GOAL #5   Title Patient will decrease fascial restrictions in LUE to min amount or less to increase abilty to complete functional reaching tasks with less difficulty.    Time 4   Period Weeks   Status On-going                  Plan - 10/14/16 1134    Clinical Impression Statement A: Added therapy ball in abduction this session, continued with functional reaching. Pt reports she has been having little to no pain over the past week. This session, pt able to complete AA/ROM in protraction with no OT assistance. OT provided min assist with flexion to prevent arm dropping.    Plan P: Attempt AA/ROM flexion in sitting, continue with functional reaching tasks   OT Home Exercise Plan 10/31: table slides   Consulted and Agree with Plan of Care Patient      Patient will benefit from skilled therapeutic intervention in order to improve the following deficits and impairments:  Decreased strength, Pain, Decreased range of motion, Impaired UE functional use, Increased fascial restricitons  Visit Diagnosis: Other symptoms and signs involving the musculoskeletal system  Stiffness of left shoulder, not elsewhere classified  Acute pain of left shoulder    Problem List Patient Active Problem List   Diagnosis Date Noted  . Parkinson's disease (HCC) 10/08/2016  . 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 Urine discoloration 08/16/2016  . Left shoulder pain 08/16/2016  . Achilles tendinitis of right lower extremity 04/05/2016  . Hypertensive retinopathy of both eyes,  grade 2 01/15/2016  . Left leg DVT (HCC) 12/18/2015  . 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 Internal hemorrhoids 03/10/2015  . History of colon polyps 12/25/2014  . Health care maintenance 02/13/2014  . Subclinical hyperthyroidism 12/25/2013  . Right renal  mass 06/11/2012  . Osteoarthritis of both knees 03/15/2012  . Parkinsonism (HCC) 07/13/2010  . Diabetes mellitus type 2, diet-controlled (HCC) 05/22/2009  . ALLERGIC RHINITIS, SEASONAL 02/20/2007  . Hyperlipidemia 09/27/2006  . Essential hypertension 09/27/2006  . GERD 09/27/2006   Ezra Sites, OTR/L  [phone] 10/14/2016, 11:39 AM  [facility] [address] [place], [place], [address] Phone: [phone]   Fax:  [phone]  Name: Nicole Baxter MRN: [id number] Date of Birth: 12-03-1940

## 2016-10-18 ENCOUNTER — Encounter (HOSPITAL_COMMUNITY): Payer: Self-pay | Admitting: Occupational Therapy

## 2016-10-18 ENCOUNTER — Ambulatory Visit (HOSPITAL_COMMUNITY): Payer: PPO | Admitting: Occupational Therapy

## 2016-10-18 DIAGNOSIS — M25612 Stiffness of left shoulder, not elsewhere classified: Secondary | ICD-10-CM

## 2016-10-18 DIAGNOSIS — R29898 Other symptoms and signs involving the musculoskeletal system: Secondary | ICD-10-CM | POA: Diagnosis not present

## 2016-10-18 DIAGNOSIS — M25512 Pain in left shoulder: Secondary | ICD-10-CM

## 2016-10-18 NOTE — Therapy (Signed)
[facility] [address] [place], [place], [address] Phone: [phone]   Fax:  [phone]  Occupational Therapy Treatment  Patient Details  Name: Nicole NgoRuby L Baxter MRN: [id number] Date of Birth: 02/13/1941 Referring Provider: Tyson Aliasuncan Thomas Vincent, MD  Encounter Date: 10/18/2016      OT End of Session - 10/18/16 1205    Visit Number 6   Number of Visits 8   Date for OT Re-Evaluation 10/27/16   Authorization Type [employer] ($15 co pay)   Authorization Time Period before 10th visit   Authorization - Visit Number 6   Authorization - Number of Visits 10   OT Start Time 1119   OT Stop Time 1158   OT Time Calculation (min) 39 min   Activity Tolerance Patient tolerated treatment well   Behavior During Therapy [facility] for tasks assessed/performed      Past Medical History:  Diagnosis Date  . Arthritis   . GERD (gastroesophageal reflux disease)    pt. states she no longer has GERD (09/11/15)  . Hyperglycemia    isolated FBS 122, 95, 104 (Random 138)  . Hyperlipidemia    on pravastatin  . Hypertension    controlled on 2 agents  . 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 Obesity   . Parkinson's disease (tremor, stiffness, slow motion, unstable posture) (HCC)   . Shortness of breath dyspnea    pt. states that she no longer has SOB )09/11/15  . Tremor     Past Surgical History:  Procedure Laterality Date  . CESAREAN SECTION    . COLONOSCOPY    . TOTAL KNEE ARTHROPLASTY Left 09/23/2015   Procedure: LEFT TOTAL KNEE ARTHROPLASTY;  Surgeon: Loreta Aveaniel F Murphy, MD;  [facility];  Service: Orthopedics;  Laterality: Left;    There were no vitals filed for this visit.      Subjective Assessment - 10/18/16 1119    Subjective  S: No pain and it's even cloudy outside.    Currently in Pain? No/denies            [facility] - 10/18/16 1119      Assessment   Diagnosis left shoulder pain     Precautions   Precautions Fall                  OT Treatments/Exercises (OP) - 10/18/16 1124      Bed Mobility   Supine to Sit 4: Min assist   Sit to Supine 3: Mod assist     Transfers   Sit to Stand 4: Min guard   Stand to Sit 5: Supervision     Exercises   Exercises Shoulder     Shoulder Exercises: Supine   Protraction PROM;AAROM;5 reps   Horizontal ABduction PROM;AAROM;5 reps   Horizontal ABduction Limitations OT providing min assist due to weakness and fatigue   External Rotation PROM;AAROM;5 reps   Internal Rotation PROM;AAROM;5 reps   Flexion PROM;AAROM;5 reps   Flexion Limitations OT providing min assist due to weakness   ABduction PROM;5 reps     Shoulder Exercises: Seated   Protraction AAROM;5 reps   External Rotation AAROM;5 reps   Internal Rotation AAROM;5 reps   Flexion AAROM;5 reps   Other Seated Exercises PVC pipe slide, 10X, mod difficulty     Functional Reaching Activities   Mid Level Pt completed functional reaching with resistive clothespin activity. Pt placed 8 clothespins on vertical bar using LUE, focusing  on reaching into protraction and horizontal abduction. Pt required increased time.      Manual Therapy   Manual Therapy Myofascial release   Manual therapy comments Manual therapy performed separately from all other skilled services   Myofascial Release Myofascial release to left upper arm, trapezius, and scapularis regions to decrease pain and fascial restriction and increase joint range of motion.                   OT Short Term Goals - 09/30/16 1626      OT SHORT TERM GOAL #1   Title Patient will be educated and able to complete HEP and/or functional mobility tasks with supervision or min assist if needed.    Time 4   Period Weeks   Status On-going     OT SHORT TERM GOAL #2   Title Patient will increase A/ROM to [facility] to increase ability to reaching at shoulder level or higher during functional tasks.    Time 4    Period Weeks   Status On-going     OT SHORT TERM GOAL #3   Title Patient will report a pain level that does not increase above 1/10 during daily tasks.    Time 4   Period Weeks   Status On-going     OT SHORT TERM GOAL #4   Title Patient will increase LUE strength to 3/5 to increase ability position self comfortably on left side with less difficulty at night.   Time 4   Period Weeks   Status On-going     OT SHORT TERM GOAL #5   Title Patient will decrease fascial restrictions in LUE to min amount or less to increase abilty to complete functional reaching tasks with less difficulty.    Time 4   Period Weeks   Status On-going                  Plan - 10/18/16 1205    Clinical Impression Statement A: Completed AA/ROM protraction and flexion in sitting, giving pt goal to touch OT's hand. Pt able to complete resistive clothespin activity reaching vertical bar, whereas on previous trial unable to reach vertical bar. Pt with improve ability to complete exercises with little to no assist from OT, verbal cuing and occasional tactile assist for correct form.    Plan P: Reassessment   OT Home Exercise Plan 10/31: table slides   Consulted and Agree with Plan of Care Patient      Patient will benefit from skilled therapeutic intervention in order to improve the following deficits and impairments:  Decreased strength, Pain, Decreased range of motion, Impaired UE functional use, Increased fascial restricitons  Visit Diagnosis: Other symptoms and signs involving the musculoskeletal system  Stiffness of left shoulder, not elsewhere classified  Acute pain of left shoulder    Problem List Patient Active Problem List   Diagnosis Date Noted  . Parkinson's disease (HCC) 10/08/2016  . 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 Urine discoloration 08/16/2016  . Left shoulder pain 08/16/2016  . Achilles tendinitis of right lower extremity 04/05/2016  . Hypertensive  retinopathy of both eyes, grade 2 01/15/2016  . Left leg DVT (HCC) 12/18/2015  . 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 Internal hemorrhoids 03/10/2015  . History of colon polyps 12/25/2014  . Health care maintenance 02/13/2014  . Subclinical hyperthyroidism 12/25/2013  . Right renal mass 06/11/2012  . Osteoarthritis of both knees 03/15/2012  . Parkinsonism (HCC) 07/13/2010  .  Diabetes mellitus type 2, diet-controlled (HCC) 05/22/2009  . ALLERGIC RHINITIS, SEASONAL 02/20/2007  . Hyperlipidemia 09/27/2006  . Essential hypertension 09/27/2006  . GERD 09/27/2006    Ezra Sites, OTR/L  [phone] 10/18/2016, 12:08 PM  [facility] [address] [place], [place], [address] Phone: [phone]   Fax:  [phone]  Name: Nicole Baxter MRN: [id number] Date of Birth: 1941-10-06

## 2016-10-26 ENCOUNTER — Encounter (HOSPITAL_COMMUNITY): Payer: Self-pay | Admitting: Occupational Therapy

## 2016-10-26 ENCOUNTER — Encounter (HOSPITAL_COMMUNITY): Payer: PPO | Admitting: Occupational Therapy

## 2016-10-26 ENCOUNTER — Ambulatory Visit (HOSPITAL_COMMUNITY): Payer: PPO | Admitting: Occupational Therapy

## 2016-10-26 DIAGNOSIS — R29898 Other symptoms and signs involving the musculoskeletal system: Secondary | ICD-10-CM

## 2016-10-26 DIAGNOSIS — M25612 Stiffness of left shoulder, not elsewhere classified: Secondary | ICD-10-CM

## 2016-10-26 DIAGNOSIS — M25512 Pain in left shoulder: Secondary | ICD-10-CM

## 2016-10-26 NOTE — Therapy (Signed)
[place], [place], [address] Phone: [phone]   Fax:  [phone]  Occupational Therapy Reassessment, Treatment, and Discharge Summary  Patient Details  Name: Nicole Baxter MRN: [id number] Date of Birth: 05-03-1941 Referring Provider: Axel Filler, MD  Encounter Date: 10/26/2016      OT End of Session - 10/26/16 1400    Visit Number 7   Number of Visits 8   Date for OT Re-Evaluation 10/27/16   Authorization Type [employer] ($15 co pay)   Authorization Time Period before 10th visit   Authorization - Visit Number 7   Authorization - Number of Visits 10   OT Start Time [phone]   OT Stop Time 1027   OT Time Calculation (min) 45 min   Activity Tolerance Patient tolerated treatment well   Behavior During Therapy [facility] for tasks assessed/performed      Past Medical History:  Diagnosis Date  . Arthritis   . GERD (gastroesophageal reflux disease)    pt. states she no longer has GERD (09/11/15)  . Hyperglycemia    isolated FBS 122, 95, 104 (Random 138)  . Hyperlipidemia    on pravastatin  . Hypertension    controlled on 2 agents  . 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 Obesity   . Parkinson's disease (tremor, stiffness, slow motion, unstable posture) ([place])   . Shortness of breath dyspnea    pt. states that she no longer has SOB )09/11/15  . Tremor     Past Surgical History:  Procedure Laterality Date  . CESAREAN SECTION    . COLONOSCOPY    . TOTAL KNEE ARTHROPLASTY Left 09/23/2015   Procedure: LEFT TOTAL KNEE ARTHROPLASTY;  Surgeon: Ninetta Lights, MD;  Location: [place];  Service: Orthopedics;  Laterality: Left;    There were no vitals filed for this visit.      Subjective Assessment - 10/26/16 0943    Subjective  S: I can reach my cabinets better now.    Currently in Pain? Yes   Pain Score 1    Pain Location Shoulder   Pain Orientation Left   Pain Descriptors /  Indicators Tender   Pain Type Acute pain   Pain Radiating Towards none   Pain Onset More than a month ago   Pain Frequency Occasional   Aggravating Factors  when raising arm   Pain Relieving Factors positioning   Effect of Pain on Daily Activities None   Multiple Pain Sites No           [facility] OT Assessment - 10/26/16 0942      Assessment   Diagnosis left shoulder pain     Precautions   Precautions Fall     Palpation   Palpation comment Min fascial restrictions in left upper arm, trapezius, and scapularis region.      AROM   Overall AROM Comments Assessed seated. IR/er adducted   AROM Assessment Site Shoulder   Right/Left Shoulder Left   Left Shoulder Flexion 104 Degrees  74 previous   Left Shoulder ABduction 100 Degrees  75 previous   Left Shoulder Internal Rotation 90 Degrees  same as previous   Left Shoulder External Rotation 50 Degrees  33 previous     PROM   Overall PROM Comments Assessed supine. IR/er adducted.   PROM Assessment Site Shoulder   Right/Left Shoulder Left   Left Shoulder Flexion 163 Degrees  120 previous   Left  Shoulder ABduction 180 Degrees  114 previous   Left Shoulder Internal Rotation 90 Degrees  same as previous   Left Shoulder External Rotation 61 Degrees  41 previous     Strength   Overall Strength Comments Assessed seated. IR/er adducted.   Strength Assessment Site Shoulder   Right/Left Shoulder Left   Left Shoulder Flexion 3+/5   Left Shoulder ABduction 3/5   Left Shoulder Internal Rotation 4-/5   Left Shoulder External Rotation 3+/5                  OT Treatments/Exercises (OP) - 10/26/16 0958      Bed Mobility   Supine to Sit 4: Min assist   Sit to Supine 3: Mod assist     Transfers   Sit to Stand 4: Min guard   Stand to Sit 5: Supervision     Exercises   Exercises Shoulder     Shoulder Exercises: Supine   Protraction PROM;5 reps   Horizontal ABduction PROM;5 reps   External Rotation PROM;5 reps    Internal Rotation PROM;5 reps   Flexion PROM;5 reps   ABduction PROM;5 reps     Shoulder Exercises: Seated   Protraction AAROM;10 reps   Horizontal ABduction AAROM;5 reps   External Rotation AAROM;5 reps   Internal Rotation AAROM;5 reps   Flexion AAROM;5 reps   Abduction AAROM;5 reps     Manual Therapy   Manual Therapy Myofascial release   Manual therapy comments Manual therapy performed separately from all other skilled services   Myofascial Release Myofascial release to left upper arm, trapezius, and scapularis regions to decrease pain and fascial restriction and increase joint range of motion.                OT Education - 10/26/16 1402    Education provided Yes   Education Details AA/ROM  in sitting; functional reaching practice   Person(s) Educated Patient   Methods Explanation;Demonstration;Handout   Comprehension Verbalized understanding;Returned demonstration          OT Short Term Goals - 10/26/16 1008      OT SHORT TERM GOAL #1   Title Patient will be educated and able to complete HEP and/or functional mobility tasks with supervision or min assist if needed.    Time 4   Period Weeks   Status Achieved     OT SHORT TERM GOAL #2   Title Patient will increase A/ROM to [employer] to increase ability to reaching at shoulder level or higher during functional tasks.    Time 4   Period Weeks   Status Partially Met     OT SHORT TERM GOAL #3   Title Patient will report a pain level that does not increase above 1/10 during daily tasks.    Time 4   Period Weeks   Status Achieved     OT SHORT TERM GOAL #4   Title Patient will increase LUE strength to 3/5 to increase ability position self comfortably on left side with less difficulty at night.   Time 4   Period Weeks   Status Achieved     OT SHORT TERM GOAL #5   Title Patient will decrease fascial restrictions in LUE to min amount or less to increase abilty to complete functional reaching tasks with less  difficulty.    Time 4   Period Weeks   Status Achieved                  Plan -  November 02, 2016 1402    Clinical Impression Statement A: Reassessment completed this session, pt has met 4/5 goals and partially met an additional goal. Pt reports she is now having minimal pain intermittently when reaching to end range A/ROM. Pt is able to complete ADL, IADL, and bed mobility tasks at baseline level of functioning now using LUE as assist. Pt is pleased with functional level of LUE and is agreeable to discharge with HEP. Instructed in AA/ROM HEP, pt able to perform with handout.    Plan P: Discharge pt   OT Home Exercise Plan 10/31: table slides; 11/28: AA/ROM   Consulted and Agree with Plan of Care Patient      Patient will benefit from skilled therapeutic intervention in order to improve the following deficits and impairments:  Decreased strength, Pain, Decreased range of motion, Impaired UE functional use, Increased fascial restricitons  Visit Diagnosis: Other symptoms and signs involving the musculoskeletal system  Stiffness of left shoulder, not elsewhere classified  Acute pain of left shoulder      G-Codes - 02-Nov-2016 1455    Functional Assessment Tool Used Clinical judgement   Functional Limitation Carrying, moving and handling objects   Carrying, Moving and Handling Objects Goal Status ([id number]) At least 40 percent but less than 60 percent impaired, limited or restricted   Carrying, Moving and Handling Objects Discharge Status [phone]) At least 40 percent but less than 60 percent impaired, limited or restricted      Problem List Patient Active Problem List   Diagnosis Date Noted  . Parkinson's disease ([facility]) 10/08/2016  . 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 Urine discoloration 08/16/2016  . Left shoulder pain 08/16/2016  . Achilles tendinitis of right lower extremity 04/05/2016  . Hypertensive retinopathy of both eyes, grade 2 01/15/2016  . Left leg DVT  ([place]) 12/18/2015  . 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 Internal hemorrhoids 03/10/2015  . History of colon polyps 12/25/2014  . Health care maintenance 02/13/2014  . Subclinical hyperthyroidism 12/25/2013  . Right renal mass 06/11/2012  . Osteoarthritis of both knees 03/15/2012  . Parkinsonism ([place]) 07/13/2010  . Diabetes mellitus type 2, diet-controlled ([place]) 05/22/2009  . ALLERGIC RHINITIS, SEASONAL 02/20/2007  . Hyperlipidemia 09/27/2006  . Essential hypertension 09/27/2006  . GERD 09/27/2006   Guadelupe Sabin, OTR/L  [phone] 11/02/16, 2:56 PM  [facility] [address] [place], [place], [address] Phone: [phone]   Fax:  [phone]  Name: LADASHA SCHNACKENBERG MRN: [id number] Date of Birth: 1941/11/05    OCCUPATIONAL THERAPY DISCHARGE SUMMARY  Visits from Start of Care: 7  Current functional level related to goals / functional outcomes: See above. Pt is able to complete ADL tasks at baseline functioning. Pt is also completing bed mobility with increased independence.    Remaining deficits: Pt continues to have minimal pain at end of A/ROM. A/ROM continues to be limited to 50-75% range.    Education / Equipment: AA/ROM exercises Plan: Patient agrees to discharge.  Patient goals were met. Patient is being discharged due to being pleased with the current functional level.  ?????

## 2016-10-26 NOTE — Patient Instructions (Signed)
Perform each exercise ___10_____ reps. 1x/day.   Protraction - STANDING  Start by holding a wand or cane at chest height.  Next, slowly push the wand outwards in front of your body so that your elbows become fully straightened. Then, return to the original position.     Shoulder FLEXION - STANDING - PALMS UP  In the standing position, hold a wand/cane with both arms, palms up on both sides. Raise up the wand/cane allowing your unaffected arm to perform most of the effort. Your affected arm should be partially relaxed.      Internal/External ROTATION - STANDING  In the standing position, hold a wand/cane with both hands keeping your elbows bent. Move your arms and wand/cane to one side.  Your affected arm should be partially relaxed while your unaffected arm performs most of the effort.       Shoulder ABDUCTION - STANDING  While holding a wand/cane palm face up on the injured side and palm face down on the uninjured side, slowly raise up your injured arm to the side.                     Horizontal Abduction/Adduction      Straight arms holding cane at shoulder height, bring cane to right, center, left. Repeat starting to left.   Copyright  [employer]. All rights reserved.

## 2016-10-28 ENCOUNTER — Ambulatory Visit (HOSPITAL_COMMUNITY): Payer: PPO | Admitting: Occupational Therapy

## 2016-11-23 ENCOUNTER — Ambulatory Visit (HOSPITAL_COMMUNITY): Payer: PPO | Admitting: Occupational Therapy

## 2016-12-16 ENCOUNTER — Ambulatory Visit (INDEPENDENT_AMBULATORY_CARE_PROVIDER_SITE_OTHER): Payer: PPO | Admitting: Internal Medicine

## 2016-12-16 ENCOUNTER — Encounter: Payer: Self-pay | Admitting: Internal Medicine

## 2016-12-16 VITALS — BP 154/69 | HR 65 | Temp 98.5°F | Wt 133.9 lb

## 2016-12-16 DIAGNOSIS — G8929 Other chronic pain: Secondary | ICD-10-CM

## 2016-12-16 DIAGNOSIS — I10 Essential (primary) hypertension: Secondary | ICD-10-CM | POA: Diagnosis not present

## 2016-12-16 DIAGNOSIS — Z79899 Other long term (current) drug therapy: Secondary | ICD-10-CM

## 2016-12-16 DIAGNOSIS — Z86718 Personal history of other venous thrombosis and embolism: Secondary | ICD-10-CM

## 2016-12-16 DIAGNOSIS — I82402 Acute embolism and thrombosis of unspecified deep veins of left lower extremity: Secondary | ICD-10-CM

## 2016-12-16 DIAGNOSIS — Z23 Encounter for immunization: Secondary | ICD-10-CM

## 2016-12-16 DIAGNOSIS — G20 Parkinson's disease: Secondary | ICD-10-CM

## 2016-12-16 DIAGNOSIS — M545 Low back pain: Secondary | ICD-10-CM

## 2016-12-16 DIAGNOSIS — Z0000 Encounter for general adult medical examination without abnormal findings: Secondary | ICD-10-CM

## 2016-12-16 DIAGNOSIS — M7661 Achilles tendinitis, right leg: Secondary | ICD-10-CM

## 2016-12-16 DIAGNOSIS — E785 Hyperlipidemia, unspecified: Secondary | ICD-10-CM

## 2016-12-16 MED ORDER — DICLOFENAC SODIUM 1 % TD GEL: TRANSDERMAL | 1 refills | Status: DC

## 2016-12-16 MED ORDER — ATORVASTATIN CALCIUM 40 MG PO TABS: 40.0000 mg | ORAL_TABLET | Freq: Every day | ORAL | 3 refills | Status: DC

## 2016-12-16 MED ORDER — LISINOPRIL 20 MG PO TABS: 20.0000 mg | ORAL_TABLET | Freq: Every day | ORAL | 1 refills | Status: DC

## 2016-12-16 MED ORDER — MELOXICAM 7.5 MG PO TABS
7.5000 mg | ORAL_TABLET | Freq: Every day | ORAL | 1 refills | Status: DC
Start: 2016-12-16 — End: 2017-02-08

## 2016-12-16 NOTE — Patient Instructions (Signed)
It was a pleasure to see you today [name].  I think your back pain is likely due to arthritis or possibly muscle strain. I would like to try as a medicine with less risk of side effects than Norco and see if we get a benefit. I have prescribed Meloxicam 7.5mg  to take daily as needed for pain. Do not take this medicine more than at most 2 times per day. If you get no relief with this please call us back in a week or two so we could discuss other options for your pain.  I have refilled several of your medicines but I recommend following up sometime within the next 2 months with your primary doctor if possible to follow up your health maintenance/.

## 2016-12-19 DIAGNOSIS — G8929 Other chronic pain: Secondary | ICD-10-CM | POA: Insufficient documentation

## 2016-12-19 DIAGNOSIS — M545 Low back pain, unspecified: Secondary | ICD-10-CM | POA: Insufficient documentation

## 2016-12-19 NOTE — Assessment & Plan Note (Addendum)
She completed 6 month treatment for a first provoked DVT after her knee surgery last year and has not had any more problems with major swelling or pain in the left leg. She is out of Eliquis before this visit and I see no indication to restart today so I'll remove this from her medication list.

## 2016-12-19 NOTE — Progress Notes (Signed)
   CC: Back pain  HPI:  [name] is a 76 y.o. woman with a history of Parkinson's disease as well as hypertension and osteoarthritis who is here today about her chronic intermittent lower back pain. This pain has been going on sporadically for greater than the past 1 year. She says this pain is about a 4 out of 10 in intensity today but occasionally becomes worse. There is no significant radiation to her legs. Her gait is chronically abnormal secondary to her Parkinson's disease. She is actually been controlling this back pain at home taking left over Norco very infrequently which had been prescribed about a year ago after her left knee surgery. She is not taking other medicine for her back and has not been on chronic treatments in the past.   See problem based assessment and plan below for additional details  Past Medical History:  Diagnosis Date  . Arthritis   . GERD (gastroesophageal reflux disease)    pt. states she no longer has GERD (09/11/15)  . Hyperglycemia    isolated FBS 122, 95, 104 (Random 138)  . Hyperlipidemia    on pravastatin  . Hypertension    controlled on 2 agents  . 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 Obesity   . Parkinson's disease (tremor, stiffness, slow motion, unstable posture) ([facility])   . Shortness of breath dyspnea    pt. states that she no longer has SOB )09/11/15  . Tremor     Review of Systems:  Review of Systems  Cardiovascular: Negative for leg swelling.  Gastrointestinal: Negative for abdominal pain.  Genitourinary: Negative for dysuria.  Musculoskeletal: Positive for back pain and joint pain.  Neurological: Positive for tremors. Negative for focal weakness and weakness.    Physical Exam: Physical Exam  Constitutional: She is well-developed, well-nourished, and in no distress.  HENT:  Head: Normocephalic and atraumatic.  Eyes: Conjunctivae are normal.  Cardiovascular: Normal rate and regular rhythm.     Musculoskeletal:  Paraspinal tenderness to palpation over lower back bilaterally, no hip pain on internal/external rotation, no radiation  Neurological:  Pill rolling tremor, increased muscle tone    Vitals:   12/16/16 1335  BP: (!) 154/69  Pulse: 65  Temp: 98.5 F (36.9 C)  TempSrc: Oral  SpO2: 98%  Weight: 133 lb 14.4 oz (60.7 kg)    Assessment & Plan:   See Encounters Tab for problem based charting.  Patient discussed with Dr. Cyndie ChimeGranfortuna

## 2016-12-19 NOTE — Assessment & Plan Note (Signed)
Flu shot provided today.

## 2016-12-19 NOTE — Assessment & Plan Note (Signed)
She was not having any major complaints about her right ankle today. She did comment that the Voltaren gel is offering some benefit when she tried applying this to her back during symptoms. I think its use is appropriate in either case.  Refill Voltaren gel PRN

## 2016-12-19 NOTE — Assessment & Plan Note (Signed)
Refill Lipitor 40 mg daily °

## 2016-12-19 NOTE — Progress Notes (Signed)
Medicine attending: Medical history, presenting problems, physical findings, and medications, reviewed with resident physician Dr Christopher Rice on the day of the patient visit and I concur with his evaluation and management plan. 

## 2016-12-19 NOTE — Assessment & Plan Note (Signed)
Her blood pressure is elevated today at 154/69. She is been mildly uncontrolled on some text last year since being on lisinopril only for blood pressure. Certainly hypotension should be avoided in this woman with Parkinson's disease who already has had falls last year. She is currently symptomatic with this back pain and is not having obvious complications so I'll defer escalating therapy today.  Consider resuming HCTZ again or is a combo pill at follow-up.

## 2016-12-19 NOTE — Assessment & Plan Note (Signed)
She has chronic back pain without any red flags for serious complication and the pain is only of moderate intensity. Her neurological exam of her legs is certainly abnormal but I don't think any of this is due to severe vertebral dgenerative disease at this time. I definitely don't think we need to continue the oral narcotics that she was previously prescribed for a surgery. She is not trying anything for this pain right now and does not have other medical complications besides her Parkinson's disease.  Prescribe meloxicam 7.5 mg take once daily for back pain Instructed her to call back before her next visit if she is not getting any improvement

## 2017-01-09 ENCOUNTER — Ambulatory Visit (INDEPENDENT_AMBULATORY_CARE_PROVIDER_SITE_OTHER): Payer: PPO | Admitting: Neurology

## 2017-01-09 ENCOUNTER — Encounter: Payer: Self-pay | Admitting: Neurology

## 2017-01-09 VITALS — BP 147/82 | HR 74 | Ht 61.5 in | Wt 135.0 lb

## 2017-01-09 DIAGNOSIS — G20 Parkinson's disease: Secondary | ICD-10-CM

## 2017-01-09 MED ORDER — CARBIDOPA-LEVODOPA 25-100 MG PO TABS: ORAL_TABLET | ORAL | 12 refills | Status: DC

## 2017-01-09 NOTE — Progress Notes (Signed)
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 No falls. She is not drinking water but she is drinking sprite 2 glasses a day. Discussed this is not enough. She completed physical therapy and she feels this helped. She has not taken her medication today and has a resting tremor. Gait is better after physical therapy. Memory is stable.She can remember her grandchildren's names today. B12 a month ago was much improved to over 900 (from 185).   Interval history 10/08/2016:  Patient and husband are here today with daughter for follow-up of Parkinson's disease and dementia. She has a past medical history of recently diagnosed hyperthyroidism(treated, last tsh wnl), essential tremor, hypertension, asthma, hyperlipidemia, diabetes, osteoarthritis. No history of dopamine blocking agents. 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 Sinemet helps her resting tremor however she forgets to take it.  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 Patient has not been noncompliant with medications or directions. Today fortunately she is here with her daughter who is going to be more involved. Reviewed with daughter that I do feel that carbidopa/levodopa is the best medication for patient unfortunately in the past she would take 1 pill a day or stop taking it all together even though she reported it would help when taking it. Reviewed the history of medications tried with  mother and discussed Parkinson's disease. We'll try Sinemet again, discussed with daughter that she should be involved with patient's medication management, she should set up a pill dispenser and also visit multiple times a week or hire somebody to manage daily compliance. Patient is a fall risk and this is a concern.  Interval history 02/01/2016:She feels the medication is helping. She has not taken the Sinemet since yesterday. When she takes the sinemet the tremor improves. She takes it 3x a day. Here with husband. Giving ut to her at 10am, 1pm and 8 pm. Advised to take it every 4 hours. Wears off in 2-3 hours. Discussed trying a patch every day to see if that helps with medication administration. When she takes the medication the tremor improves and then it wears off in 3 hours. No falls. She forgets to take the medication. She is having some hallucinations. She was seeing people that were not there and babies not there. Her memory is stable. She has drooling. Drool on her pillow and sometimes during the day and it starts dripping. No difficulty swallowing. No choking on foods. No falls. Can continue to take the Sinemet.   Interval History 11/03/2015: here for follow up of Parkinsonism. She says she ran out of the prescription a few days ago and she has not taken the Sinemet in a few days. It really helps when she takes it. When she takes the Sinemet, the tremors stop. No side effects from the Sinemet. She feels the Sinemet lasts until the next dose. She has memory loss. We had ordered an MRI of the brain and she tried to do it but got claustrophobic. We need to get an MRi of the brain due to parkinsonism and  memory loss. Will order and give some Xanax before going in to an open MRI. She has a manual wheelchair, we tried to get her a power wheelchair but it was too expensive. She feels much improved since being discharged from the hospital and starting B12 injections. B12 was 185 and she is having injections.  Last thyroid was normal. Will do a MoCA today and start Rivastigmine for memory loss. Denies any confusion or altered mentation recently. They take the sinemet at 9am then 3or 4pm then 9pm. Discussed should take it every 4 hours. 9am, 1pm and 5pm. Do not take it with protein. No falls. No problems swallowing, not choking on food. No dyskinesias.  Addendum 07/22/2015: Called to see how she is doing. She hasn't taken the Sinemet since yesterday. Not taking it three times a day as prescribed. Sounds like it is difficullt for patient to remember to take her medication. She does say when she takes the medication it helps.   Interval update 06/24/2015:They are taking 1/2 pill of the sinemet with breakfast. She forgot she is not supposed to take it with food. It helps the tremor. No side effects from the Sinemet. Will increase the sinemet to a whole pill three times a day. She did not take her sinemet this morning. Symptoms brain they're Sinemet with some the next time they come, we can take it in clinic and evaluate response. But if the Sinemet appears to help with her tremor, and this confirms the diagnosis of Parkinson's. May consider adding Azilect concurrently.  Interval update 05/12/2014: She had the radioactive iodine treatment and tremor is much improved. She still has bilateral upper resting tremor. She says she is walking a little better but husband disagrees. Husband says her knee hurts and stays sore. Husband feels her voice volume is lower and he can't hear her. She shuffles. Her sense of smell is poor. She is off balance.   We'll start Sinemet low dose. They are to call me back in 2 weeks to discuss how patient is responding. Discussed common side effects including nausea and GI side effects, orthostatic hypotension, dizziness and increased risk of falls. They are to stop the medication if significant side effects occur.   04/14/2015: Nicole Ngo is a 76 y.o. female here as a referral from  Dr. Andrey Campanile for tremor.She has a past medical history of recently diagnosed hyperthyroidism. She is here with her husband who provides much of the information. She also has a past medical history of essential tremor, hypertension, asthma, hyperlipidemia, asthma, diabetes, osteoarthritis. No history of dopamine blocking agents. 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  She has a daily tremor. She had a tremor in the past, similar tremor but it went away. It came back last year. She has good days and bad days with the tremor. Her right leg sometimes won't move. There are some days when it is better. She shuffles and is slow walking. She can't move when walking sometimes, she freezes. The tone of her voice is lower, husband says she used to talk much louder. She doesn't yell anymore. She denies decreased smell sensation. She has constipation. Her hand writing is terrible. She is able to eat with her tremor. Tremor is better with movement. Worse at rest. Answer legs shake, feels like nervousness. Mother has a tremor. Cousins have tremors so they think it is  a family issue. She is hyperthyroid and is going to [facility] for treatment. No FHx of PD. She uses a walker at baseline. Walking has worsened.  Reviewed notes, labs and imaging from outside physicians, which showed: Notes state the patient has been seen by neurology in the past however I cannot find a note in Epic. She follows with Dr. Andrey Campanile who notes resting tremor, rigidity and bradykinesia concerning for Parkinson's disease. She was started on gabapentin 100 mg 3 times a day and referred to neurology. Notes state that years ago she was seen by neurologist and advised to take Benadryl for tremor. Dr. Andrey Campanile advised her to stop due to risk of anticholinergic symptoms in the elderly. TSH decreased  0.132 weeks ago, liver function tests with elevated AST and a LT, BMP unremarkable  Review of Systems: Patient complains of symptoms per HPI as well as the following symptoms: joint pain, nack pain, neck pain, no CP, no SOB. Pertinent negatives per HPI. All others negative.  Social History   Social History  . Marital status: Married    Spouse name: N/A  . Number of children: 5  . Years of education: 12   Occupational History  . Not on file.   Social History Main Topics  . Smoking status: Never Smoker  . Smokeless tobacco: Never Used  . Alcohol use No  . Drug use: No  . Sexual activity: No   Other Topics Concern  . Not on file   Social History Narrative   Worked at [employer] 31 years. Married.Active, out and about q day.   5 children, 1 with sarcoidosis, 1 with COPD.   Caffeine use: Drinks a 6 pack of soda per week    Family History  Problem Relation Age of Onset  . Heart disease Mother 74    died of MI @ 69  . Alcohol abuse Father   . Cancer Brother   . Colon cancer Neg Hx     Past Medical History:  Diagnosis Date  . Arthritis   . GERD (gastroesophageal reflux disease)    pt. states she no longer has GERD (09/11/15)  . Hyperglycemia    isolated FBS 122, 95, 104 (Random 138)  . Hyperlipidemia    on pravastatin  . Hypertension    controlled on 2 agents  . 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 Obesity   . Parkinson's disease (tremor, stiffness, slow motion, unstable posture) (HCC)   . Shortness of breath dyspnea    pt. states that she no longer has SOB )09/11/15  . Tremor     Past Surgical History:  Procedure Laterality Date  . CESAREAN SECTION    . COLONOSCOPY    . TOTAL KNEE ARTHROPLASTY Left 09/23/2015   Procedure: LEFT TOTAL KNEE ARTHROPLASTY;  Surgeon: Loreta Ave, MD;  [facility] OR;  Service: Orthopedics;  Laterality: Left;    Current Outpatient Prescriptions  Medication Sig Dispense Refill  . albuterol (PROVENTIL  HFA;VENTOLIN HFA) 108 (90 Base) MCG/ACT inhaler Inhale 2 puffs into the lungs every 6 (six) hours as needed for wheezing or shortness of breath. 1 Inhaler 2  . atorvastatin (LIPITOR) 40 MG tablet Take 1 tablet (40 mg total) by mouth daily. Patient requested easy open caps for all medication bottles. 30 tablet 3  . Calcium Carbonate-Vitamin D (CALCIUM 600/VITAMIN D) 600-400 MG-UNIT tablet Take 2 tablets by mouth daily. 180 tablet 1  . carbidopa-levodopa (SINEMET) 25-100 MG tablet Take 1 tablet  by mouth 3 (three) times daily. Take 4 hours apart. 270 tablet 4  . cyanocobalamin ([facility] VITAMIN B12) 1000 MCG tablet Take 1 tablet (1,000 mcg total) by mouth daily. 30 tablet 1  . diclofenac sodium (VOLTAREN) 1 % GEL Apply small amount to RIGHT achilles tendon area three times a day. 100 g 1  . lisinopril (PRINIVIL,ZESTRIL) 20 MG tablet Take 1 tablet (20 mg total) by mouth daily. 90 tablet 1  . meloxicam (MOBIC) 7.5 MG tablet Take 1 tablet (7.5 mg total) by mouth daily. 30 tablet 1  . rivastigmine (EXELON) 4.5 MG capsule Take 1 capsule (4.5 mg total) by mouth 2 (two) times daily. 60 capsule 11   No current facility-administered medications for this visit.     Allergies as of 01/09/2017  . (No Known Allergies)    Vitals: There were no vitals taken for this visit. Last Weight:  Wt Readings from Last 1 Encounters:  12/16/16 133 lb 14.4 oz (60.7 kg)   Last Height:   Ht Readings from Last 1 Encounters:  10/06/16 5' 1.5" (1.562 m)        Coordination: bradykinesia and dec. amplitude on finger taps  Gait: freezing, shuffling, en-bloc turning. Decreased arm swing, re-emergent tremor. Bradykinesia. Stable.     Motor Observation:  Resting tremor in the bilateral upper extremities.  Tone:  Increased tone in the upper extremities, cogwheeling  Posture:  stooped   Strength:  Strength is intact in the upper and lower limbs.    Sensation: intact to LT       Assessment/Plan: Nicole Baxter is a 76 y.o. female here as a  For f/u of Parkinson's disease. Patient and husband are here today with daughter again. 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 Patient has not been noncompliant with medications or directions. Today fortunately she is here with her daughter again who has been more involved and patient medication compliance has improved and she is taking her sinemet 3x a day with good results. Patient does report freezing, shuffling of gait, resting tremor, hypophonia, impaired smell. Exam does show bradykinesia on finger taps and walking, freezing, en-bloc turning, increased tone in the upper extremities and resting tremor in upper extremities with re-emergent tremor and decreased arm swing.She did not take her Sinemet this morning however.   Will order Home health. Completed  Will increase Sinemet to 1.5 pill 3x a day and can increase to 2 pills if needed but asked her to hydrate more and watch for lightheadedness and hypotension. Asked husband and daughter to pay attention when she takes the pill and see if tremor improves(per daughter it does significantly), how much it improves, and when the Sinemet starts wearing off(it lasts 4 hours). Watch for side effects. Take it 4 hours apart such as 9am, 1pm and 5pm. Do not take with protein, take 30 minutes before or an hour after eating protein. May take an extra dose in the evenings if needed.   Taking PO B12 and improved to over 900 from 185. Her thyroid appears to be normalized, had hyperthyroidism.   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 Continue Rivastigmine 4.5mg  twice daily. Discussed side effects again: Nausea, vomiting, diarrhea, loss of appetite/weight loss, dizziness, drowsiness, weakness, trouble sleeping, shakiness (tremor), or  muscle cramps. More serious side effects can include AV block, bradycardia, syncope, seizures, GI bleeding, rash.  Needs follow  up in 3 months with me. Asked daughter to attend all appts.  Discussed parkinson's disease vs Parkinsonism, discussed again, what it is and the symptoms. Discussed that Parkinson's disease is a neurodegenerative disease which involves dopamine in the brain. Progression is variable. People with Parkinson's can display tremor, shuffling, stooped posture, impaired balance and falls, lowered voice volume, constipation, decreased smell sensation. Risk of falls is especially problematic and vigilance as needed. Patient should always use her walker. Provided patient and daughter handout on Parkinson's disorder, online organizations as well as Parkinson's support group that meets here in [place] with contact information.   Naomie Dean, MD  [facility] [address] [place], [place] [address]  Phone [phone] Fax [phone]  A total of 25  minutes was spent face-to-face with this patient. Over half this time was spent on counseling patient on the Parkinsonism diagnosis and different diagnostic and therapeutic options available.

## 2017-01-09 NOTE — Patient Instructions (Addendum)
Remember to drink plenty of fluid, eat healthy meals and do not skip any meals. Try to eat protein with a every meal and eat a healthy snack such as fruit or nuts in between meals. Try to keep a regular sleep-wake schedule and try to exercise daily, particularly in the form of walking, 20-30 minutes a day, if you can.   As far as your medications are concerned, I would like to suggest: Increase the Carbidopa/Levodopa to 1.5 tablets three times a day 4 hours apart. Can take an extra dose in the evenings if needed. Can increase dose to 2 pills three times a day if needed, watch for lightheasdedness, falls, dizziness and increase fluid intake.   I would like to see you back in 3 months, sooner if we need to. Please call us with any interim questions, concerns, problems, updates or refill requests.   Our phone number is [phone]. We also have an after hours call service for urgent matters and there is a physician on-call for urgent questions. For any emergencies you know to call 911 or go to the nearest emergency room

## 2017-01-10 ENCOUNTER — Other Ambulatory Visit: Payer: Self-pay

## 2017-01-10 MED ORDER — CARBIDOPA-LEVODOPA 25-100 MG PO TABS: ORAL_TABLET | ORAL | 11 refills | Status: DC

## 2017-01-10 NOTE — Telephone Encounter (Signed)
Attempted to e-scribe rx. However, instructions are too long and rx printed, awaiting MD signature.

## 2017-01-31 ENCOUNTER — Ambulatory Visit: Payer: PPO | Admitting: Internal Medicine

## 2017-02-07 ENCOUNTER — Ambulatory Visit: Payer: PPO | Admitting: Internal Medicine

## 2017-02-08 ENCOUNTER — Encounter: Payer: Self-pay | Admitting: Internal Medicine

## 2017-02-08 ENCOUNTER — Ambulatory Visit (INDEPENDENT_AMBULATORY_CARE_PROVIDER_SITE_OTHER): Payer: PPO | Admitting: Internal Medicine

## 2017-02-08 VITALS — BP 144/77 | HR 63 | Temp 97.8°F | Ht 61.5 in | Wt 133.5 lb

## 2017-02-08 DIAGNOSIS — Z8639 Personal history of other endocrine, nutritional and metabolic disease: Secondary | ICD-10-CM | POA: Diagnosis not present

## 2017-02-08 DIAGNOSIS — I82402 Acute embolism and thrombosis of unspecified deep veins of left lower extremity: Secondary | ICD-10-CM

## 2017-02-08 DIAGNOSIS — Z79899 Other long term (current) drug therapy: Secondary | ICD-10-CM

## 2017-02-08 DIAGNOSIS — G3183 Dementia with Lewy bodies: Secondary | ICD-10-CM | POA: Diagnosis not present

## 2017-02-08 DIAGNOSIS — J4520 Mild intermittent asthma, uncomplicated: Secondary | ICD-10-CM

## 2017-02-08 DIAGNOSIS — I10 Essential (primary) hypertension: Secondary | ICD-10-CM

## 2017-02-08 DIAGNOSIS — E0590 Thyrotoxicosis, unspecified without thyrotoxic crisis or storm: Secondary | ICD-10-CM | POA: Diagnosis not present

## 2017-02-08 DIAGNOSIS — E785 Hyperlipidemia, unspecified: Secondary | ICD-10-CM

## 2017-02-08 DIAGNOSIS — Z86718 Personal history of other venous thrombosis and embolism: Secondary | ICD-10-CM

## 2017-02-08 DIAGNOSIS — M545 Low back pain, unspecified: Secondary | ICD-10-CM

## 2017-02-08 DIAGNOSIS — M7661 Achilles tendinitis, right leg: Secondary | ICD-10-CM

## 2017-02-08 DIAGNOSIS — G8929 Other chronic pain: Secondary | ICD-10-CM | POA: Diagnosis not present

## 2017-02-08 DIAGNOSIS — F0280 Dementia in other diseases classified elsewhere without behavioral disturbance: Secondary | ICD-10-CM

## 2017-02-08 DIAGNOSIS — G20C Parkinsonism, unspecified: Secondary | ICD-10-CM

## 2017-02-08 DIAGNOSIS — G20 Parkinson's disease: Secondary | ICD-10-CM

## 2017-02-08 MED ORDER — MELOXICAM 7.5 MG PO TABS: 7.5000 mg | ORAL_TABLET | Freq: Every day | ORAL | 1 refills | Status: DC

## 2017-02-08 MED ORDER — RIVASTIGMINE TARTRATE 4.5 MG PO CAPS: 4.5000 mg | ORAL_CAPSULE | Freq: Two times a day (BID) | ORAL | 11 refills | Status: DC

## 2017-02-08 MED ORDER — ALBUTEROL SULFATE HFA 108 (90 BASE) MCG/ACT IN AERS: 2.0000 | INHALATION_SPRAY | Freq: Four times a day (QID) | RESPIRATORY_TRACT | 2 refills | Status: DC | PRN

## 2017-02-08 MED ORDER — DICLOFENAC SODIUM 1 % TD GEL: TRANSDERMAL | 1 refills | Status: DC

## 2017-02-08 MED ORDER — ATORVASTATIN CALCIUM 40 MG PO TABS: 40.0000 mg | ORAL_TABLET | Freq: Every day | ORAL | 3 refills | Status: DC

## 2017-02-08 MED ORDER — LISINOPRIL-HYDROCHLOROTHIAZIDE 20-12.5 MG PO TABS: 1.0000 | ORAL_TABLET | Freq: Every day | ORAL | 3 refills | Status: DC

## 2017-02-08 MED ORDER — CYANOCOBALAMIN 1000 MCG PO TABS: 1000.0000 ug | ORAL_TABLET | Freq: Every day | ORAL | 1 refills | Status: AC

## 2017-02-08 MED ORDER — ACETAMINOPHEN 500 MG PO TABS: 500.0000 mg | ORAL_TABLET | Freq: Once | ORAL | Status: DC

## 2017-02-08 NOTE — Assessment & Plan Note (Signed)
-   Patient follows up with neurology - Had her sinemet 25/100 mg increased to 1.5 pills TID and states her symptoms are much better controlled - Able to ambulate with cane, only mild resting tremor - Will continue to monitor.

## 2017-02-08 NOTE — Assessment & Plan Note (Signed)
-   Refilled rivastigmine today - States her memory is stable on this medication - No further work up for now

## 2017-02-08 NOTE — Assessment & Plan Note (Signed)
-   No further swelling or pain - Able to ambulate without difficulty - Will c/w voltaren gel PRN - Completed PT for this - No further work up for now

## 2017-02-08 NOTE — Patient Instructions (Addendum)
-   It was a pleasure seeing you today - Your blood pressure was high today. We will change your BP medication to lisinopril/hydrochlorthiazide - We will check some blood work on you today - Please follow up in 3 months

## 2017-02-08 NOTE — Assessment & Plan Note (Signed)
-   Will refill atorvastatin today - recheck lipid profile at next appointment

## 2017-02-08 NOTE — Assessment & Plan Note (Signed)
-   Pain is well controlled on meloxicam - Refilled this today - No further work up for now

## 2017-02-08 NOTE — Progress Notes (Signed)
   Subjective:    Patient ID: Nicole Baxter, female    DOB: 05/18/1941, 76 y.o.   MRN: [id number]  HPI I Have seen and examined this patient. She is here for routine follow-up of her hypertension and back pain.  Patient has history of parkinsonism and follows up with Dr. Lucia GaskinsAhern as an outpatient. She had her Sinemet dose increased on her last visit. She states that she feels much better on the current dose of Sinemet and is able to ambulate using a cane.  Patient states she is compliant with her medications. She states the back pain is also well controlled on meloxicam. She has no other complaints at this time.   Review of Systems  Constitutional: Negative.   HENT: Negative.   Respiratory: Negative.   Cardiovascular: Negative.   Gastrointestinal: Positive for abdominal pain.       Intermittent left sided abdominal pain which she attributes to gas. States she does not have any pain currently  Musculoskeletal: Positive for back pain.  Neurological: Negative.   Psychiatric/Behavioral: Negative.        Objective:   Physical Exam  Constitutional: She is oriented to person, place, and time. She appears well-developed and well-nourished.  HENT:  Head: Normocephalic and atraumatic.  Mouth/Throat: No oropharyngeal exudate.  Neck: Neck supple.  Cardiovascular: Normal rate, regular rhythm and normal heart sounds.   Pulmonary/Chest: Breath sounds normal. No respiratory distress. She has no wheezes.  Abdominal: Soft. Bowel sounds are normal. She exhibits no distension. There is no tenderness.  Musculoskeletal: Normal range of motion. She exhibits no edema.  Lymphadenopathy:    She has no cervical adenopathy.  Neurological: She is alert and oriented to person, place, and time.  Skin: Skin is warm. No erythema.  Psychiatric: She has a normal mood and affect. Her behavior is normal.          Assessment & Plan:  Please see problem based charting for assessment and plan:

## 2017-02-08 NOTE — Assessment & Plan Note (Signed)
BP Readings from Last 3 Encounters:  02/08/17 (!) 144/77  01/09/17 (!) 147/82  12/16/16 (!) 154/69    Lab Results  Component Value Date   NA 141 10/14/2015   K 4.5 10/14/2015   CREATININE 0.87 10/14/2015    Assessment: Blood pressure control:  fair Progress toward BP goal:   unchanged Comments: compliant with lisinopril 20 mg  Plan: Medications:  will change patient to lisinopril/HCTZ 20/12.5 mg given her elevated BP Educational resources provided: brochure (denies need ) Self management tools provided:  (denies need ) Other plans: will check BMP today

## 2017-02-08 NOTE — Assessment & Plan Note (Signed)
-   Patient is now off anti coagulation after completing 6 months after a provoked DVT - No CP, no sob, no LE swelling/pain - Will continue to monitor off a/c

## 2017-02-08 NOTE — Assessment & Plan Note (Signed)
-   Patient with no symptoms of hypo/hyperthyroidism - Will repeat TSH and free T4 today - No further work up for now

## 2017-02-09 ENCOUNTER — Telehealth: Payer: Self-pay | Admitting: Internal Medicine

## 2017-02-09 LAB — BMP8+ANION GAP
Anion Gap: 14 mmol/L (ref 10.0–18.0)
BUN / CREAT RATIO: 23 (ref 12–28)
BUN: 17 mg/dL (ref 8–27)
CALCIUM: 8.8 mg/dL (ref 8.7–10.3)
CO2: 25 mmol/L (ref 18–29)
CREATININE: 0.75 mg/dL (ref 0.57–1.00)
Chloride: 105 mmol/L (ref 96–106)
GFR, EST AFRICAN AMERICAN: 91 mL/min/{1.73_m2} (ref >59)
GFR, EST NON AFRICAN AMERICAN: 79 mL/min/{1.73_m2} (ref >59)
GLUCOSE: 99 mg/dL (ref 65–99)
Potassium: 4 mmol/L (ref 3.5–5.2)
SODIUM: 144 mmol/L (ref 134–144)

## 2017-02-09 LAB — T4, FREE: FREE T4: 0.95 ng/dL (ref 0.82–1.77)

## 2017-02-09 LAB — TSH: TSH: 5.54 u[IU]/mL — ABNORMAL HIGH (ref 0.450–4.500)

## 2017-02-09 NOTE — Telephone Encounter (Signed)
Called patient to discuss lab work. BMP was wnl but she was noted to have a mildly elevated TSH with a normal free T4. Possibly subclinical hypothyroidism. No clinical signs or symptoms of hypothyroidism. Will monitor off synthroid for now and recheck her TSH on follow up. Patient is in agreement with plan and expresses understanding

## 2017-04-03 ENCOUNTER — Telehealth: Payer: Self-pay

## 2017-04-03 DIAGNOSIS — I10 Essential (primary) hypertension: Secondary | ICD-10-CM

## 2017-04-03 MED ORDER — LISINOPRIL-HYDROCHLOROTHIAZIDE 20-12.5 MG PO TABS: 1.0000 | ORAL_TABLET | Freq: Every day | ORAL | 3 refills | Status: DC

## 2017-04-03 NOTE — Telephone Encounter (Signed)
  Pharmacy requesting 90 day refill on Lisinopril

## 2017-04-03 NOTE — Telephone Encounter (Signed)
Patient was switched to lisinopril-HCTZ at the last visit so is no longer on lisinopril by itself.  I will assume the request is therefore lisinopril-HCTZ.  A 90 day supply with 3 additional refills have been submitted.

## 2017-04-09 ENCOUNTER — Other Ambulatory Visit: Payer: Self-pay | Admitting: Internal Medicine

## 2017-04-09 DIAGNOSIS — G8929 Other chronic pain: Secondary | ICD-10-CM

## 2017-04-09 DIAGNOSIS — M545 Low back pain: Principal | ICD-10-CM

## 2017-04-10 ENCOUNTER — Encounter: Payer: Self-pay | Admitting: Neurology

## 2017-04-10 ENCOUNTER — Ambulatory Visit (INDEPENDENT_AMBULATORY_CARE_PROVIDER_SITE_OTHER): Payer: PPO | Admitting: Neurology

## 2017-04-10 VITALS — BP 128/92 | HR 76 | Ht 61.5 in | Wt 135.0 lb

## 2017-04-10 DIAGNOSIS — G20 Parkinson's disease: Secondary | ICD-10-CM | POA: Diagnosis not present

## 2017-04-10 MED ORDER — CARBIDOPA-LEVODOPA 25-100 MG PO TABS: ORAL_TABLET | ORAL | 11 refills | Status: DC

## 2017-04-10 NOTE — Progress Notes (Signed)
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 But it is improved. She can tell the difference. No side effects. Will increase to 2 pills three times a day 4 hours a part try to separate from protein. No falls. Swallowing ok, no choking. She talks in her sleep. Discussed REM sleep disorder. No side effects. Daughter not here today.   Interval history 01/09/2017: Daughter notices the sinemet working and lasts until the next dose. No falls. She is not drinking water but she is drinking sprite 2 glasses a day. Discussed this is not enough. She completed physical therapy and she feels this helped. She has not taken her medication today and has a resting tremor. Gait is better after physical therapy. Memory is stable.She can remember her grandchildren's names today. B12 a month ago was much improved to over 900 (from 185).   Interval history 10/08/2016: Patient and husband are here today with daughter for follow-up of Parkinson's disease and dementia. She has a past medical history of recently diagnosed hyperthyroidism(treated, last tsh wnl), essential tremor, hypertension, asthma, hyperlipidemia, diabetes, osteoarthritis. No history of dopamine blocking agents. 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 Sinemet helps her resting tremor however she forgets to take it.  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 Patient has not been noncompliant with medications or  directions. Today fortunately she is here with her daughter who is going to be more involved. Reviewed with daughter that I do feel that carbidopa/levodopa is the best medication for patient unfortunately in the past she would take 1 pill a day or stop taking it all together even though she reported it would help when taking it. Reviewed the history of medications tried with mother and discussedParkinson's disease. We'll try Sinemet again, discussed with daughter that she should be involved with patient's medication management, she should set up a pill dispenser and also visit multiple times a week or hire somebody to manage daily compliance. Patient is a fall risk and this is a concern.  Interval history 02/01/2016:She feels the medication is helping. She has not taken the Sinemet since yesterday. When she takes the sinemet the tremor improves. She takes it 3x a day. Here with husband. Giving ut to her at 10am, 1pm and 8 pm. Advised to take it every 4 hours. Wears off in 2-3 hours. Discussed trying a patch every day to see if that helps with medication administration. When she takes the medication the tremor improves and then it wears off in 3 hours. No falls. She forgets to take the medication. She is having some hallucinations. She was seeing people that were not there and babies not there. Her memory is stable. She has drooling. Drool on her pillow and sometimes during the day and it starts dripping. No difficulty swallowing. No choking on foods. No falls. Can continue to take the Sinemet.   Interval History 11/03/2015: here for follow up of Parkinsonism. She says she ran out of the prescription a few days ago and she has not taken the Sinemet in a few  days. It really helps when she takes it. When she takes the Sinemet, the tremors stop. No side effects from the Sinemet. She feels the Sinemet lasts until the next dose. She has memory loss. We had ordered an MRI of the brain and she tried to do it but got  claustrophobic. We need to get an MRi of the brain due to parkinsonism and memory loss. Will order and give some Xanax before going in to an open MRI. She has a manual wheelchair, we tried to get her a power wheelchair but it was too expensive. She feels much improved since being discharged from the hospital and starting B12 injections. B12 was 185 and she is having injections. Last thyroid was normal. Will do a MoCA today and start Rivastigmine for memory loss. Denies any confusion or altered mentation recently. They take the sinemet at 9am then 3or 4pm then 9pm. Discussed should take it every 4 hours. 9am, 1pm and 5pm. Do not take it with protein. No falls. No problems swallowing, not choking on food. No dyskinesias.  Addendum 07/22/2015: Called to see how she is doing. She hasn't taken the Sinemet since yesterday. Not taking it three times a day as prescribed. Sounds like it is difficullt for patient to remember to take her medication. She does say when she takes the medication it helps.   Interval update 06/24/2015:They are taking 1/2 pill of the sinemet with breakfast. She forgot she is not supposed to take it with food. It helps the tremor. No side effects from the Sinemet. Will increase the sinemet to a whole pill three times a day. She did not take her sinemet this morning. Symptoms brain they're Sinemet with some the next time they come, we can take it in clinic and evaluate response. But if the Sinemet appears to help with her tremor, and this confirms the diagnosis of Parkinson's. May consider adding Azilect concurrently.  Interval update 05/12/2014: She had the radioactive iodine treatment and tremor is much improved. She still has bilateral upper resting tremor. She says she is walking a little better but husband disagrees. Husband says her knee hurts and stays sore. Husband feels her voice volume is lower and he can't hear her. She shuffles. Her sense of smell is poor. She is off  balance.   We'll start Sinemet low dose. They are to call me back in 2 weeks to discuss how patient is responding. Discussed common side effects including nausea and GI side effects, orthostatic hypotension, dizziness and increased risk of falls. They are to stop the medication if significant side effects occur.   04/14/2015: Nicole Baxter is a 76 y.o. female here as a referral from Dr. Andrey Baxter for tremor.She has a past medical history of recently diagnosed hyperthyroidism. She is here with her husband who provides much of the information. She also has a past medical history of essential tremor, hypertension, asthma, hyperlipidemia, asthma, diabetes, osteoarthritis. No history of dopamine blocking agents. 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  She has a daily tremor. She had a tremor in the past, similar tremor but it went away. It came back last year. She has good days and bad days with the tremor. Her right leg sometimes won't move. There are some days when it is better. She shuffles and is slow walking. She can't move when walking sometimes, she freezes. The  tone of her voice is lower, husband says she used to talk much louder. She doesn't yell anymore. She denies decreased smell sensation. She has constipation. Her hand writing is terrible. She is able to eat with her tremor. Tremor is better with movement. Worse at rest. Answer legs shake, feels like nervousness. Mother has a tremor. Cousins have tremors so they think it is a family issue. She is hyperthyroid and is going to [facility] for treatment. No FHx of PD. She uses a walker at baseline. Walking has worsened.  Reviewed notes, labs and imaging from outside physicians, which showed: Notes state the patient has been seen by neurology in the past however I cannot find a note in  Epic. She follows with Dr. Andrey Campanile who notes resting tremor, rigidity and bradykinesia concerning for Parkinson's disease. She was started on gabapentin 100 mg 3 times a day and referred to neurology. Notes state that years ago she was seen by neurologist and advised to take Benadryl for tremor. Dr. Andrey Campanile advised her to stop due to risk of anticholinergic symptoms in the elderly. TSH decreased 0.132 weeks ago, liver function tests with elevated AST and a LT, BMP unremarkable  Review of Systems: Patient complains of symptoms per HPI as well as the following symptoms: joint pain, nack pain, neck pain, no CP, no SOB. Pertinent negatives per HPI. All others negative.  Social History   Social History  . Marital status: Married    Spouse name: N/A  . Number of children: 5  . Years of education: 12   Occupational History  . Not on file.   Social History Main Topics  . Smoking status: Never Smoker  . Smokeless tobacco: Never Used  . Alcohol use No  . Drug use: No  . Sexual activity: No   Other Topics Concern  . Not on file   Social History Narrative   Worked at [employer] 31 years. Married.Active, out and about q day.   5 children, 1 with sarcoidosis, 1 with COPD.   Caffeine use: Drinks a 6 pack of soda per week   Right-handed    Family History  Problem Relation Age of Onset  . Heart disease Mother 54       died of MI @ 62  . Alcohol abuse Father   . Cancer Brother   . Colon cancer Neg Hx     Past Medical History:  Diagnosis Date  . Arthritis   . GERD (gastroesophageal reflux disease)    pt. states she no longer has GERD (09/11/15)  . Hyperglycemia    isolated FBS 122, 95, 104 (Random 138)  . Hyperlipidemia    on pravastatin  . Hypertension    controlled on 2 agents  . 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 Obesity   . Parkinson's disease (tremor, stiffness, slow motion, unstable posture) (HCC)   . Shortness of breath dyspnea    pt. states that she no  longer has SOB )09/11/15  . Tremor     Past Surgical History:  Procedure Laterality Date  . CESAREAN SECTION    . COLONOSCOPY    . TOTAL KNEE ARTHROPLASTY Left 09/23/2015   Procedure: LEFT TOTAL KNEE ARTHROPLASTY;  Surgeon: Loreta Ave, Nicole Baxter;  Location: [facility] OR;  Service: Orthopedics;  Laterality: Left;    Current Outpatient Prescriptions  Medication Sig Dispense Refill  . albuterol (PROVENTIL HFA;VENTOLIN HFA) 108 (90 Base) MCG/ACT inhaler Inhale 2 puffs into the lungs every 6 (  six) hours as needed for wheezing or shortness of breath. 1 Inhaler 2  . atorvastatin (LIPITOR) 40 MG tablet Take 1 tablet (40 mg total) by mouth daily. Patient requested easy open caps for all medication bottles. 30 tablet 3  . Calcium Carbonate-Vitamin D (CALCIUM 600/VITAMIN D) 600-400 MG-UNIT tablet Take 2 tablets by mouth daily. 180 tablet 1  . carbidopa-levodopa (SINEMET) 25-100 MG tablet Take 1.5 pills 3 times a day.Take 4 hours apart. May increase to 2 pills three times a day as needed. May take an extra dose in the evenings. 240 tablet 11  . cyanocobalamin ([facility] VITAMIN B12) 1000 MCG tablet Take 1 tablet (1,000 mcg total) by mouth daily. 30 tablet 1  . diclofenac sodium (VOLTAREN) 1 % GEL Apply small amount to RIGHT achilles tendon area three times a day. 100 g 1  . lisinopril-hydrochlorothiazide (PRINZIDE,ZESTORETIC) 20-12.5 MG tablet Take 1 tablet by mouth daily. 90 tablet 3  . meloxicam (MOBIC) 7.5 MG tablet TAKE 1 TABLET BY MOUTH ONCE DAILY 90 tablet 0  . rivastigmine (EXELON) 4.5 MG capsule Take 1 capsule (4.5 mg total) by mouth 2 (two) times daily. 60 capsule 11   Current Facility-Administered Medications  Medication Dose Route Frequency Provider Last Rate Last Dose  . acetaminophen (TYLENOL) tablet 500 mg  500 mg Oral Once Nicole Baxter, Nicole Baxter        Allergies as of 04/10/2017  . (No Known Allergies)    Vitals: There were no vitals taken for this visit. Last Weight:  Wt Readings from Last 1  Encounters:  02/08/17 133 lb 8 oz (60.6 kg)   Last Height:   Ht Readings from Last 1 Encounters:  02/08/17 5' 1.5" (1.562 m)     Coordination: bradykinesia and dec. amplitude on finger taps  Gait: freezing, shuffling, en-bloc turning. Decreased arm swing, re-emergent tremor. Bradykinesia. Stable.     Motor Observation:  Resting tremor in the bilateral upper extremities.  Tone:  Increased tone in the upper extremities, cogwheeling  Posture:  stooped   Strength:  Strength is intact in the upper and lower limbs.    Sensation: intact to LT      Assessment/Plan: Nicole Baxter is a 75y.o. female here as a For f/u of Parkinson's disease. Patient and husband are here today with daughter again. 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 Patient has not been noncompliant with medications or directions. Her daughter did not come today, had asked that she come ot every appointment.  Patient does report freezing, shuffling of gait, resting tremor, hypophonia, impaired smell. Exam does show bradykinesia on finger taps and walking, freezing, en-bloc turning, increased tone in the upper extremities and resting tremor in upper extremities with re-emergent tremor and decreased arm swing.    Will order Home health. Completed  Will increase Sinemet to 2 pilsl 3x a day and can increase to 4x a day if needed but asked her to hydrate more and watch for lightheadedness and hypotension. Asked husband and daughterto pay attention when she takes the pill and see if tremor improves(per daughter last appointment it does significantly) and patient endorses as well today, how much it improves, and when the Sinemet starts wearing off(it lasts 4 hours). Watch for side effects. Take it 4 hours apart such as 9am, 1pm and 5pm. Do not take with protein, take 30 minutes  before or an hour after eating protein. May take  an extra dose in the evenings if needed.   Taking PO B12 and improved to over 900 from 185. Her thyroid appears to be normalized, had hyperthyroidism.   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 Continue Rivastigmine 4.5mg  twice daily. Discussed side effects again: Nausea, vomiting, diarrhea, loss of appetite/weight loss, dizziness, drowsiness, weakness, trouble sleeping, shakiness (tremor), or muscle cramps. More serious side effects can include AV block, bradycardia, syncope, seizures, GI bleeding, rash.  Needs follow up in 3 months with me.Asked daughter to attend all appts.  Discussed parkinson's disease vs Parkinsonism, discussed again, what it is and the symptoms. Discussed that Parkinson's disease is a neurodegenerative disease which involves dopamine in the brain. Progression is variable. People with Parkinson's can display tremor, shuffling, stooped posture, impaired balance and falls, lowered voice volume, constipation, decreased smell sensation. Risk of falls is especially problematic and vigilance as needed. Patient should always use her walker. Provided patient and daughterhandout on Parkinson's disorder, online organizations as well as Parkinson's support group that meets here in [place] with contact information.   Nicole Dean, Nicole Baxter  [facility] [address] [place], [place] [address]  Phone [phone] Fax [phone]  A total of 15 minutes was spent face-to-face with this patient. Over half this time was spent on counseling patient on the Parkinsonism diagnosis and different diagnostic and therapeutic options available.

## 2017-04-10 NOTE — Patient Instructions (Signed)
Remember to drink plenty of fluid, eat healthy meals and do not skip any meals. Try to eat protein with a every meal and eat a healthy snack such as fruit or nuts in between meals. Try to keep a regular sleep-wake schedule and try to exercise daily, particularly in the form of walking, 20-30 minutes a day, if you can.   As far as your medications are concerned, I would like to suggest: Increase Sinemet to 2 pills 3x a day.Try to separate Sinemet from food (especially protein-rich foods like meat, dairy, eggs) by about 30-60 mins - this will help the absorption of the medication. If you have some nausea with the medication, you can take it with some light food like crackers or ginger ale  I would like to see you back in 4 months, sooner if we need to. Please call us with any interim questions, concerns, problems, updates or refill requests.   Our phone number is [phone]. We also have an after hours call service for urgent matters and there is a physician on-call for urgent questions. For any emergencies you know to call 911 or go to the nearest emergency room

## 2017-05-09 ENCOUNTER — Ambulatory Visit (INDEPENDENT_AMBULATORY_CARE_PROVIDER_SITE_OTHER): Payer: PPO | Admitting: Internal Medicine

## 2017-05-09 ENCOUNTER — Encounter: Payer: Self-pay | Admitting: Internal Medicine

## 2017-05-09 VITALS — BP 136/77 | HR 56 | Temp 97.8°F | Ht 61.5 in | Wt 131.1 lb

## 2017-05-09 DIAGNOSIS — G20C Parkinsonism, unspecified: Secondary | ICD-10-CM

## 2017-05-09 DIAGNOSIS — M545 Low back pain, unspecified: Secondary | ICD-10-CM

## 2017-05-09 DIAGNOSIS — I10 Essential (primary) hypertension: Secondary | ICD-10-CM | POA: Diagnosis not present

## 2017-05-09 DIAGNOSIS — G8929 Other chronic pain: Secondary | ICD-10-CM

## 2017-05-09 DIAGNOSIS — Z79899 Other long term (current) drug therapy: Secondary | ICD-10-CM | POA: Diagnosis not present

## 2017-05-09 DIAGNOSIS — E119 Type 2 diabetes mellitus without complications: Secondary | ICD-10-CM

## 2017-05-09 DIAGNOSIS — G20 Parkinson's disease: Secondary | ICD-10-CM

## 2017-05-09 DIAGNOSIS — M7661 Achilles tendinitis, right leg: Secondary | ICD-10-CM | POA: Diagnosis not present

## 2017-05-09 DIAGNOSIS — E785 Hyperlipidemia, unspecified: Secondary | ICD-10-CM

## 2017-05-09 DIAGNOSIS — F0280 Dementia in other diseases classified elsewhere without behavioral disturbance: Secondary | ICD-10-CM

## 2017-05-09 DIAGNOSIS — E0590 Thyrotoxicosis, unspecified without thyrotoxic crisis or storm: Secondary | ICD-10-CM

## 2017-05-09 LAB — GLUCOSE, CAPILLARY
GLUCOSE-CAPILLARY: 81 mg/dL (ref 65–99)
Glucose-Capillary: 60 mg/dL — ABNORMAL LOW (ref 65–99)

## 2017-05-09 LAB — POCT GLYCOSYLATED HEMOGLOBIN (HGB A1C): Hemoglobin A1C: 5.5

## 2017-05-09 MED ORDER — DICLOFENAC SODIUM 1 % TD GEL: TRANSDERMAL | 1 refills | Status: DC

## 2017-05-09 MED ORDER — MELOXICAM 7.5 MG PO TABS: 7.5000 mg | ORAL_TABLET | Freq: Every day | ORAL | 0 refills | Status: DC

## 2017-05-09 MED ORDER — ATORVASTATIN CALCIUM 40 MG PO TABS: 40.0000 mg | ORAL_TABLET | Freq: Every day | ORAL | 3 refills | Status: DC

## 2017-05-09 MED ORDER — LISINOPRIL-HYDROCHLOROTHIAZIDE 20-12.5 MG PO TABS: 1.0000 | ORAL_TABLET | Freq: Every day | ORAL | 3 refills | Status: DC

## 2017-05-09 MED ORDER — RIVASTIGMINE TARTRATE 4.5 MG PO CAPS: 4.5000 mg | ORAL_CAPSULE | Freq: Two times a day (BID) | ORAL | 11 refills | Status: DC

## 2017-05-09 MED ORDER — CALCIUM CARBONATE-VITAMIN D 600-400 MG-UNIT PO TABS: 2.0000 | ORAL_TABLET | Freq: Every day | ORAL | 1 refills | Status: DC

## 2017-05-09 NOTE — Assessment & Plan Note (Signed)
-   Will check lipid profile today - Patient is compliant with lipitor 40 mg. Will refill this today

## 2017-05-09 NOTE — Assessment & Plan Note (Signed)
-   Patient does complain of some memory loss but states this has remained stable on her rivastigmine - Will refill this today

## 2017-05-09 NOTE — Assessment & Plan Note (Signed)
-   Patient states this has resolved but does occasionally have some pain over right achilles tendon which improves with voltaren gel  - Will refill this for her

## 2017-05-09 NOTE — Assessment & Plan Note (Signed)
-   Patient follows with Dr. Lucia GaskinsAhern and had her Sinemet dose increased on her last visit - States she is compliant with her medications and that her symptoms have improved on the increased dose - Patient to continue with sinemet and follow up with Dr. Lucia GaskinsAhern

## 2017-05-09 NOTE — Assessment & Plan Note (Signed)
BP Readings from Last 3 Encounters:  05/09/17 136/77  04/10/17 (!) 128/92  02/08/17 (!) 144/77    Lab Results  Component Value Date   NA 144 02/08/2017   K 4.0 02/08/2017   CREATININE 0.75 02/08/2017    Assessment: Blood pressure control:  well controlled Progress toward BP goal:   at goal Comments: patient is compliant with lisinopril/hctz 20/12.5 mg  Plan: Medications:  continue current medications Educational resources provided: brochure (denies need ) Self management tools provided:   Other plans: Will check BMP, lipid profile

## 2017-05-09 NOTE — Assessment & Plan Note (Signed)
-   Patient states this pain is well controlled on intermittent meloxicam use - Will refill this for her today

## 2017-05-09 NOTE — Progress Notes (Signed)
   Subjective:    Patient ID: Nicole Baxter, female    DOB: 09/02/1941, 76 y.o.   MRN: [id number]  HPI  I have seen and examined this patient. She is here for routine follow up of her HTN and DM. She has no complaints at this time and is compliant with her medications   Review of Systems  Constitutional: Negative.   HENT: Negative.   Respiratory: Negative.   Cardiovascular: Negative.   Gastrointestinal: Negative.   Musculoskeletal: Negative.   Skin: Negative.   Neurological: Negative.   Psychiatric/Behavioral: Negative.        Objective:   Physical Exam  Constitutional: She is oriented to person, place, and time. She appears well-developed and well-nourished.  HENT:  Head: Normocephalic and atraumatic.  Mouth/Throat: No oropharyngeal exudate.  Neck: Neck supple.  Cardiovascular: Normal rate, regular rhythm and normal heart sounds.   Pulmonary/Chest: Effort normal and breath sounds normal. No respiratory distress. She has no wheezes.  Abdominal: Soft. Bowel sounds are normal. She exhibits no distension. There is no tenderness.  Musculoskeletal: Normal range of motion. She exhibits no edema.  Lymphadenopathy:    She has no cervical adenopathy.  Neurological: She is alert and oriented to person, place, and time.  Skin: Skin is warm. No rash noted. No erythema.  Psychiatric: She has a normal mood and affect. Her behavior is normal.          Assessment & Plan:  Please see problem based charting for assessment and plan:

## 2017-05-09 NOTE — Patient Instructions (Signed)
-   It was a pleasure seeing you today - I have refilled all your medications - Please follow up with me in 3 months - please start taking ensure/boost to supplement your meals - We will check some blood work and get a foot exam today

## 2017-05-09 NOTE — Assessment & Plan Note (Signed)
-   Patient is complaining of some mild fatigue and decreased appetite - She is not any medications and had a mildly elevated TSH on her last visit - Will recheck this today

## 2017-05-09 NOTE — Assessment & Plan Note (Signed)
-   Patient with A1C persistently lass than 6 on no medications - Will stop checking A1C and resolve this diagnosis - She had a foot exam done today - No further work up for now

## 2017-05-10 ENCOUNTER — Telehealth: Payer: Self-pay | Admitting: Internal Medicine

## 2017-05-10 LAB — MICROALBUMIN / CREATININE URINE RATIO
Creatinine, Urine: 192.6 mg/dL
Microalb/Creat Ratio: 3.2 mg/g creat (ref 0.0–30.0)
Microalbumin, Urine: 6.1 ug/mL

## 2017-05-10 LAB — BMP8+ANION GAP
Anion Gap: 18 mmol/L (ref 10.0–18.0)
BUN / CREAT RATIO: 16 (ref 12–28)
BUN: 16 mg/dL (ref 8–27)
CHLORIDE: 103 mmol/L (ref 96–106)
CO2: 24 mmol/L (ref 20–29)
Calcium: 8.9 mg/dL (ref 8.7–10.3)
Creatinine, Ser: 0.97 mg/dL (ref 0.57–1.00)
GFR, EST AFRICAN AMERICAN: 67 mL/min/{1.73_m2} (ref >59)
GFR, EST NON AFRICAN AMERICAN: 58 mL/min/{1.73_m2} — AB (ref >59)
GLUCOSE: 79 mg/dL (ref 65–99)
Potassium: 3.6 mmol/L (ref 3.5–5.2)
Sodium: 145 mmol/L — ABNORMAL HIGH (ref 134–144)

## 2017-05-10 LAB — LIPID PANEL
CHOLESTEROL TOTAL: 129 mg/dL (ref 100–199)
Chol/HDL Ratio: 1.6 ratio (ref 0.0–4.4)
HDL: 82 mg/dL (ref >39)
LDL CALC: 38 mg/dL (ref 0–99)
Triglycerides: 46 mg/dL (ref 0–149)
VLDL CHOLESTEROL CAL: 9 mg/dL (ref 5–40)

## 2017-05-10 LAB — TSH: TSH: 6.46 u[IU]/mL — ABNORMAL HIGH (ref 0.450–4.500)

## 2017-05-10 NOTE — Telephone Encounter (Signed)
Called patient to discuss results of blood work. She was noted to have a normal BMP with only a mildly elevated sodium level as well as a normal lipid panel and an elevated TSH to >6. She has some mild fatigue and decreased appetite which may be secondary to hypothyroidism. I will repeat this at her next appointment and if persistently high will start her on synthroid. Patient is in agreement with plan.

## 2017-06-16 ENCOUNTER — Ambulatory Visit (INDEPENDENT_AMBULATORY_CARE_PROVIDER_SITE_OTHER): Payer: PPO | Admitting: Internal Medicine

## 2017-06-16 ENCOUNTER — Encounter: Payer: Self-pay | Admitting: Internal Medicine

## 2017-06-16 ENCOUNTER — Ambulatory Visit (HOSPITAL_COMMUNITY)
Admission: RE | Admit: 2017-06-16 | Discharge: 2017-06-16 | Disposition: A | Discharge status: Home / Self Care | Payer: PPO | Source: Ambulatory Visit | Attending: Internal Medicine | Admitting: Internal Medicine

## 2017-06-16 DIAGNOSIS — K219 Gastro-esophageal reflux disease without esophagitis: Secondary | ICD-10-CM | POA: Diagnosis not present

## 2017-06-16 DIAGNOSIS — E669 Obesity, unspecified: Secondary | ICD-10-CM | POA: Diagnosis not present

## 2017-06-16 DIAGNOSIS — G20 Parkinson's disease: Secondary | ICD-10-CM | POA: Insufficient documentation

## 2017-06-16 DIAGNOSIS — I10 Essential (primary) hypertension: Secondary | ICD-10-CM | POA: Diagnosis not present

## 2017-06-16 DIAGNOSIS — R42 Dizziness and giddiness: Secondary | ICD-10-CM

## 2017-06-16 DIAGNOSIS — E785 Hyperlipidemia, unspecified: Secondary | ICD-10-CM | POA: Diagnosis not present

## 2017-06-16 DIAGNOSIS — Z8249 Family history of ischemic heart disease and other diseases of the circulatory system: Secondary | ICD-10-CM

## 2017-06-16 DIAGNOSIS — M1990 Unspecified osteoarthritis, unspecified site: Secondary | ICD-10-CM | POA: Insufficient documentation

## 2017-06-16 MED ORDER — LISINOPRIL 20 MG PO TABS: 20.0000 mg | ORAL_TABLET | Freq: Every day | ORAL | 11 refills | Status: DC

## 2017-06-16 NOTE — Assessment & Plan Note (Signed)
Patient has had 3 episodes of dizziness in the last week with these episodes she feels a hot spell followed by dizziness, weakness and feels like she is about to pass out. They have happened while she is sitting and while she is up and walking around. She has no chest pain or difficulty breathing. She feels that she is dehydrated and is not drinking enough water. The dizziness lasts for a few minutes at a time and improves after she drinks water or sits down. He does not feel dizzy at this time.  No new neurologic deficits on examination today. Cardiac exam does not show an arrhythmia.  - Orthostatics today did not produce dizziness or significant change in BP or HR  - ordered BMP  - ordered EKG  - stop lisinopril- HCTZ, switch to lisinopril

## 2017-06-16 NOTE — Assessment & Plan Note (Signed)
BP Readings from Last 3 Encounters:  05/09/17 136/77  04/10/17 (!) 128/92  02/08/17 (!) 144/77  She is hypotensive today 107/92. Lisinopril - HCTZ 20-12.5 mg combination pill was started at last office visit.  - Continue lisinopril 20 mg daily  - Discontinue combination pill

## 2017-06-16 NOTE — Progress Notes (Addendum)
   CC: dizziness   HPI:  [name] is a 76 y.o. with PMH as listed below who presents for acute concern of dizziness. Please see the assessment and plans for the status of the patient chronic medical problems.   Past Medical History:  Diagnosis Date  . Arthritis   . GERD (gastroesophageal reflux disease)    pt. states she no longer has GERD (09/11/15)  . Hyperglycemia    isolated FBS 122, 95, 104 (Random 138)  . Hyperlipidemia    on pravastatin  . Hypertension    controlled on 2 agents  . 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 Obesity   . Parkinson's disease (tremor, stiffness, slow motion, unstable posture) (HCC)   . Shortness of breath dyspnea    pt. states that she no longer has SOB )09/11/15  . Tremor    Review of Systems:  Refer to history of present illness and assessment and plans for pertinent review of systems, all others reviewed and negative  Physical Exam:  Vitals:   06/16/17 1409  Temp: 98.2 F (36.8 C)  TempSrc: Oral  SpO2: 100%  Weight: 132 lb 6.4 oz (60.1 kg)  Height: 5\' 1"  (1.549 m)   Physical Exam  Constitutional: She is oriented to person, place, and time and well-developed, well-nourished, and in no distress. No distress.  HENT:  Head: Normocephalic and atraumatic.  Eyes: Conjunctivae are normal. Right eye exhibits no discharge. Left eye exhibits no discharge. No scleral icterus.  Neck: Normal range of motion.  Cardiovascular: Normal rate and regular rhythm.   No murmur heard. Pulmonary/Chest: Effort normal and breath sounds normal. No respiratory distress. She has no wheezes. She has no rales.  Neurological: She is alert and oriented to person, place, and time.  Pill rolling tremor   Skin: Skin is warm and dry. She is not diaphoretic.  Psychiatric: Affect and judgment normal.   Assessment & Plan:   Dizziness  Patient has had 3 episodes of dizziness in the last week with these episodes she feels a hot spell followed by  dizziness, weakness and feels like she is about to pass out. They have happened while she is sitting and while she is up and walking around. She has no chest pain or difficulty breathing. She feels that she is dehydrated and is not drinking enough water. The dizziness lasts for a few minutes at a time and improves after she drinks water or sits down. He does not feel dizzy at this time.  No new neurologic deficits on examination today. Cardiac exam does not show an arrhythmia.  - Orthostatics today did not produce dizziness or significant change in BP or HR  - ordered BMP  - ordered EKG  - stop lisinopril- HCTZ, switch to lisinopril   Hypertension  BP Readings from Last 3 Encounters:  05/09/17 136/77  04/10/17 (!) 128/92  02/08/17 (!) 144/77  She is hypotensive today 107/92. Lisinopril - HCTZ 20-12.5 mg combination pill was started at last office visit.  - Continue lisinopril 20 mg daily  - Discontinue combination pill   See Encounters Tab for problem based charting.  Patient discussed with Dr. Cyndie ChimeGranfortuna

## 2017-06-16 NOTE — Patient Instructions (Addendum)
It was a pleasure to meet you today Nicole Baxter  For your dizziness,  Stop taking the Lisinopril - HCTZ pill  Instead take lisinopril 20 mg daily   You have an appointment scheduled August 14th at 10:15 AM with Dr. Heide SparkNarendra, if the dizziness gets worse before this appointment please call us and come back to the acute care clinic.

## 2017-06-17 LAB — BMP8+ANION GAP
ANION GAP: 16 mmol/L (ref 10.0–18.0)
BUN / CREAT RATIO: 15 (ref 12–28)
BUN: 13 mg/dL (ref 8–27)
CHLORIDE: 104 mmol/L (ref 96–106)
CO2: 25 mmol/L (ref 20–29)
Calcium: 9 mg/dL (ref 8.7–10.3)
Creatinine, Ser: 0.89 mg/dL (ref 0.57–1.00)
GFR calc Af Amer: 74 mL/min/{1.73_m2} (ref >59)
GFR calc non Af Amer: 64 mL/min/{1.73_m2} (ref >59)
GLUCOSE: 83 mg/dL (ref 65–99)
Potassium: 3.7 mmol/L (ref 3.5–5.2)
Sodium: 145 mmol/L — ABNORMAL HIGH (ref 134–144)

## 2017-06-19 NOTE — Progress Notes (Signed)
Medicine attending: Medical history, presenting problems, physical findings, and medications, reviewed with resident physician Dr Nina Blum on the day of the patient visit and I concur with her evaluation and management plan. 

## 2017-07-01 IMAGING — US US RENAL
1 series · 14 of 25 positions shown · non-contrast
Comparison: 06/08/2012

CLINICAL DATA: Right renal mass

EXAM:
RENAL / URINARY TRACT ULTRASOUND COMPLETE

[Series 1: us renal · 0.19mm/px · 14 of 61 slices shown]
[im 1/61]
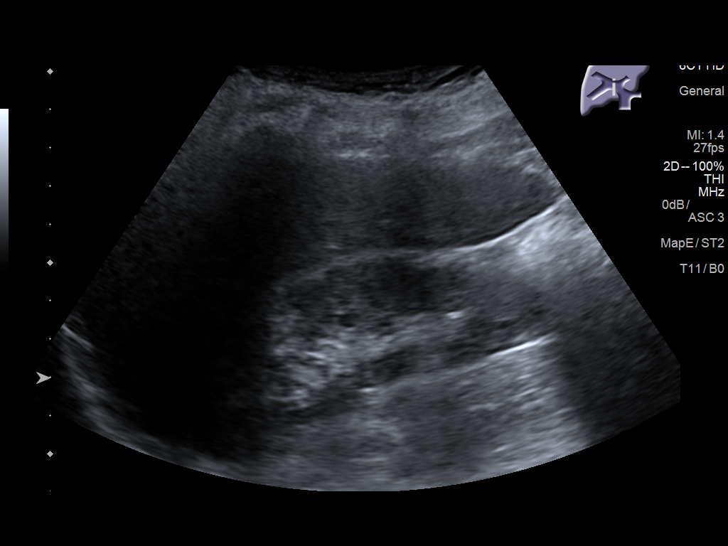
[im 6/61]
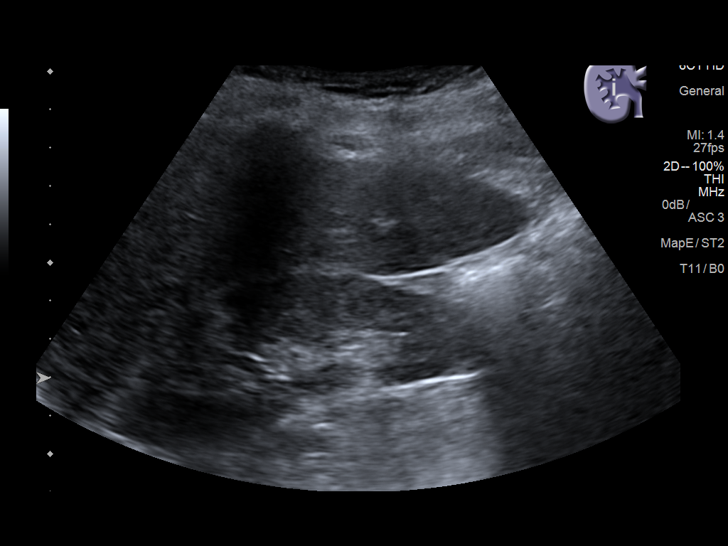
[im 11/61]
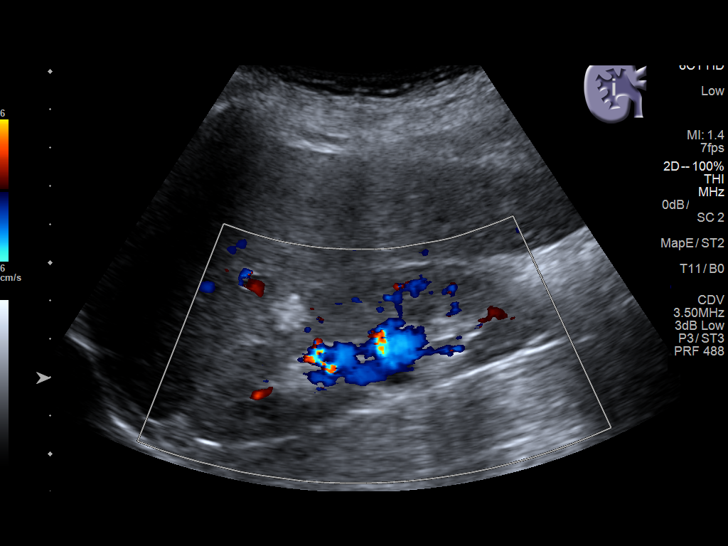
[im 16/61]
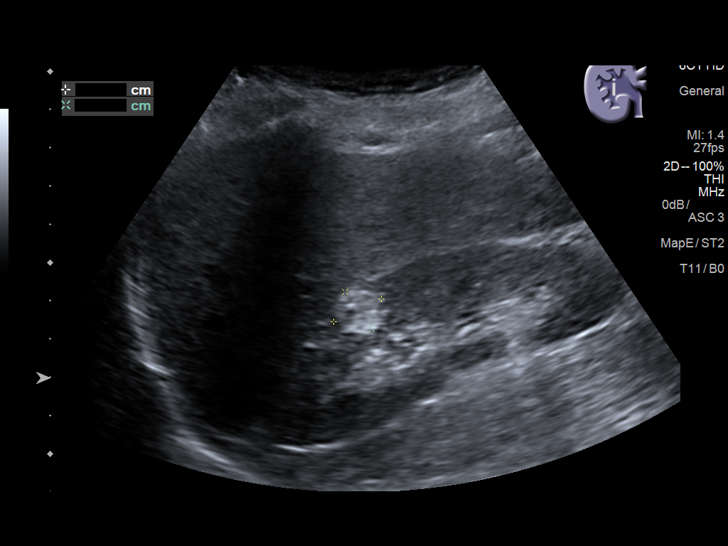
[im 21/61]
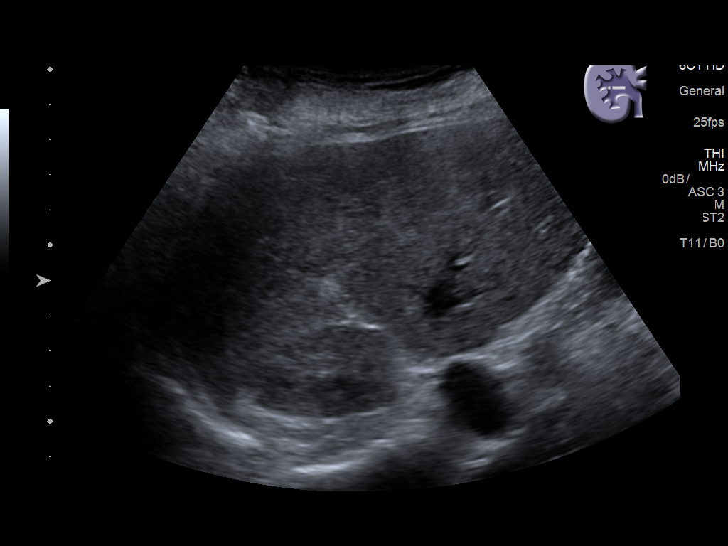
[im 23/61]
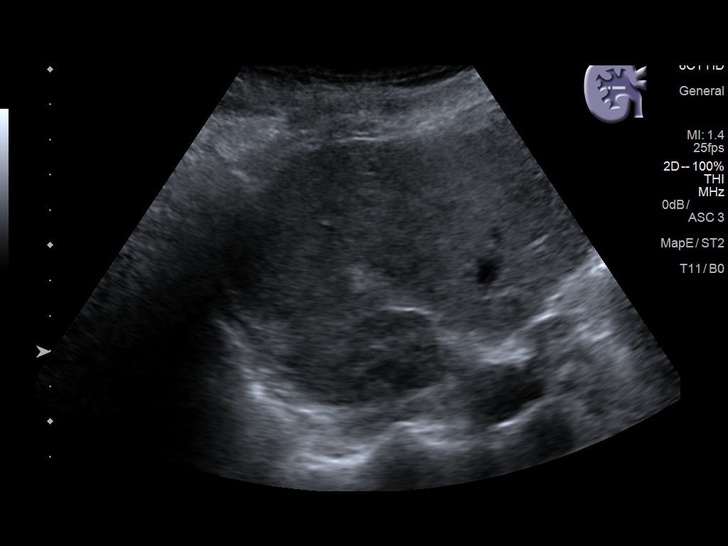
[im 28/61]
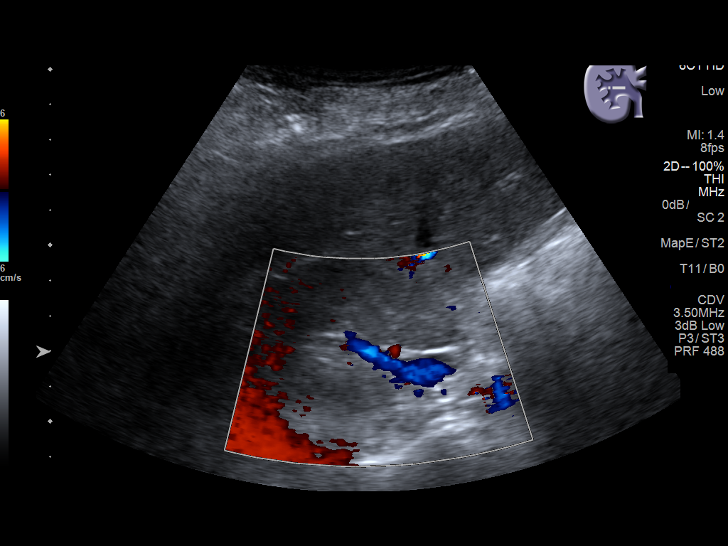
[im 33/61]
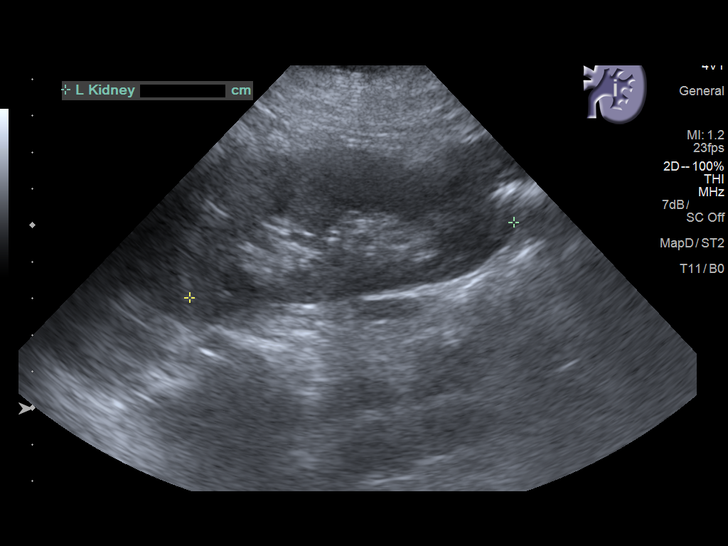
[im 38/61]
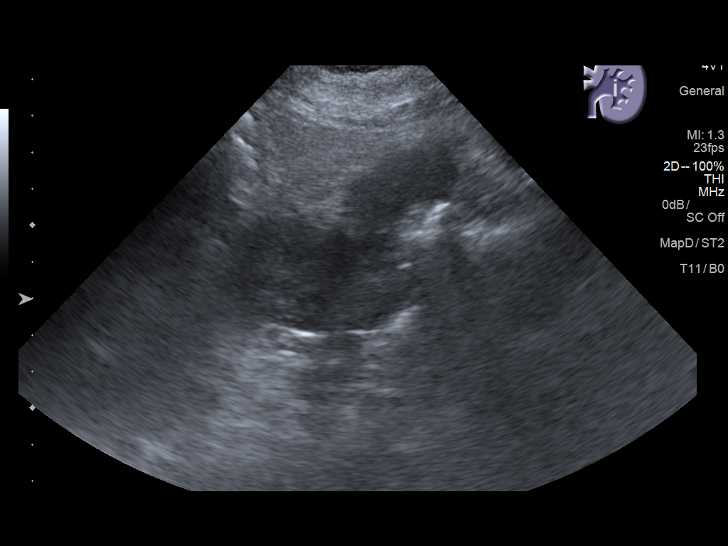
[im 41/61]
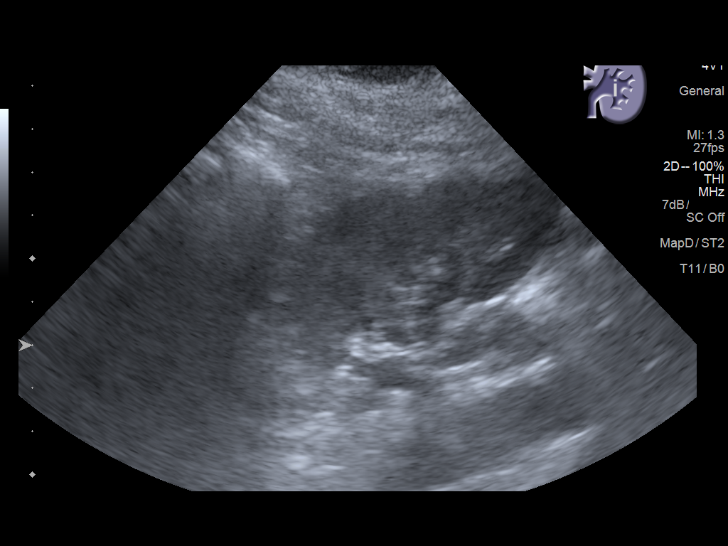
[im 46/61]
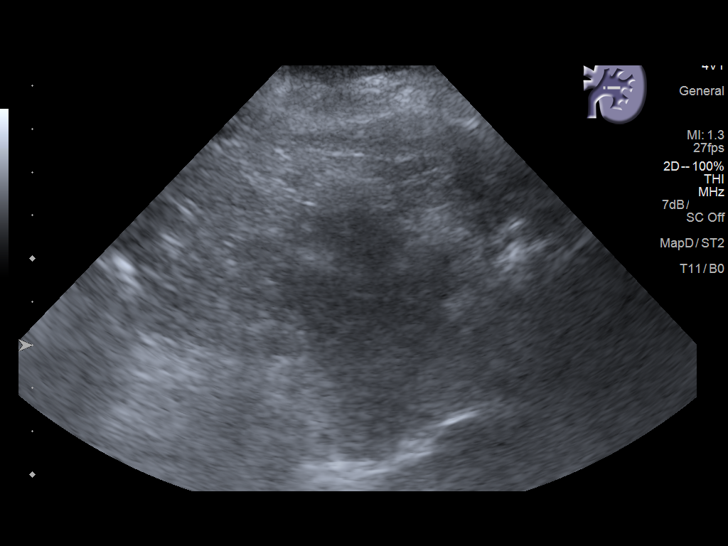
[im 51/61]
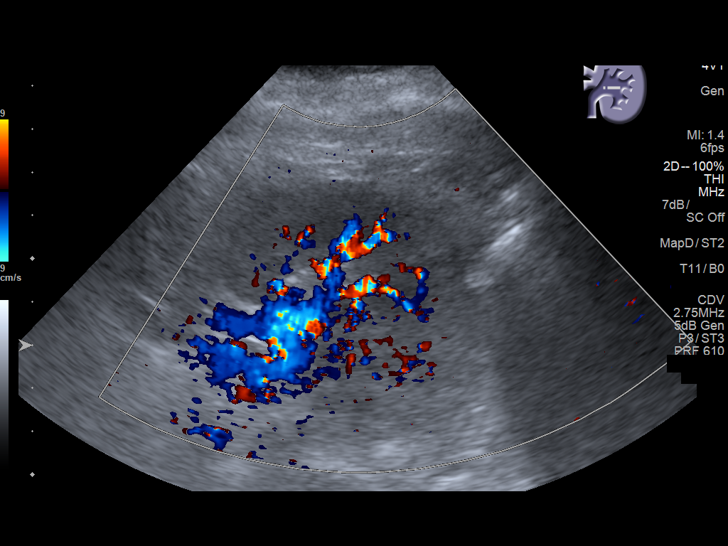
[im 56/61]
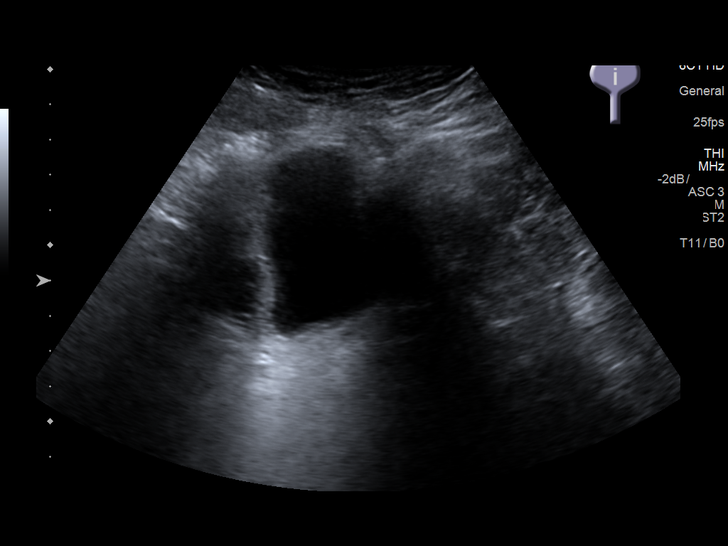
[im 61/61]
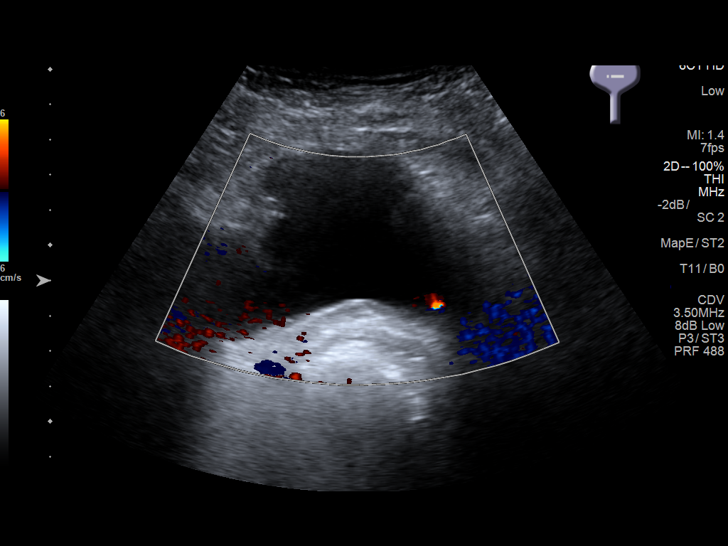

[14 of 25 positions shown; findings below may reference images not displayed]

FINDINGS: Right Kidney:

Length: 10.0 cm.. Stable 1.4 cm hyperechoic lesion is noted within
the right upper pole anteriorly. This is stable from prior MRI
examination.

Left Kidney:

Length: 9.1 cm.. Echogenicity within normal limits. No mass or
hydronephrosis visualized.

Bladder:

Appears normal for degree of bladder distention.
IMPRESSION: 1.4 cm hyperechoic lesion which is stable from prior MRI examination
in 7148. Given the stability in size this suggests a benign
etiology. The hyperechoic nature suggests the possibility of an
angiomyolipoma.

## 2017-07-11 ENCOUNTER — Encounter: Payer: Self-pay | Admitting: Internal Medicine

## 2017-07-11 ENCOUNTER — Ambulatory Visit (INDEPENDENT_AMBULATORY_CARE_PROVIDER_SITE_OTHER): Payer: PPO | Admitting: Internal Medicine

## 2017-07-11 VITALS — BP 167/74 | HR 61 | Temp 98.1°F | Ht 61.0 in | Wt 122.0 lb

## 2017-07-11 DIAGNOSIS — R634 Abnormal weight loss: Secondary | ICD-10-CM | POA: Diagnosis not present

## 2017-07-11 DIAGNOSIS — E039 Hypothyroidism, unspecified: Secondary | ICD-10-CM

## 2017-07-11 DIAGNOSIS — I10 Essential (primary) hypertension: Secondary | ICD-10-CM

## 2017-07-11 DIAGNOSIS — R51 Headache: Secondary | ICD-10-CM | POA: Diagnosis not present

## 2017-07-11 DIAGNOSIS — E0580 Other thyrotoxicosis without thyrotoxic crisis or storm: Secondary | ICD-10-CM | POA: Diagnosis not present

## 2017-07-11 DIAGNOSIS — Z79899 Other long term (current) drug therapy: Secondary | ICD-10-CM

## 2017-07-11 DIAGNOSIS — M170 Bilateral primary osteoarthritis of knee: Secondary | ICD-10-CM

## 2017-07-11 DIAGNOSIS — E038 Other specified hypothyroidism: Secondary | ICD-10-CM

## 2017-07-11 DIAGNOSIS — G20 Parkinson's disease: Secondary | ICD-10-CM

## 2017-07-11 DIAGNOSIS — Z0000 Encounter for general adult medical examination without abnormal findings: Secondary | ICD-10-CM

## 2017-07-11 DIAGNOSIS — E0590 Thyrotoxicosis, unspecified without thyrotoxic crisis or storm: Secondary | ICD-10-CM | POA: Diagnosis not present

## 2017-07-11 MED ORDER — LISINOPRIL 20 MG PO TABS: 40.0000 mg | ORAL_TABLET | Freq: Every day | ORAL | 0 refills | Status: DC

## 2017-07-11 MED ORDER — DICLOFENAC SODIUM 1 % TD GEL: TRANSDERMAL | 1 refills | Status: DC

## 2017-07-11 NOTE — Patient Instructions (Addendum)
-   It was a pleasure seeing you today - We will recheck your BP today - We will check some bloodwork today - Your BP is still high. Please take 2 pills of the lisinopril every day and follow up in 1 month for repeat BP - We will send you home with a stool test. Please send this back to us once done - You have lost approx 10 lbs in 1 month. Please continue to take your Ensure. If you continue to lose weight we will need to do a close work up

## 2017-07-11 NOTE — Assessment & Plan Note (Signed)
BP Readings from Last 3 Encounters:  07/11/17 (!) 167/74  05/09/17 136/77  04/10/17 (!) 128/92    Lab Results  Component Value Date   NA 145 (H) 06/16/2017   K 3.7 06/16/2017   CREATININE 0.89 06/16/2017    Assessment: Blood pressure control:  uncontrolled Progress toward BP goal:   deteriorated Comments: Patient is now only on lisinopril 20 mg. HCTZ was dc'd on last visit  Plan: Medications:  increase lisinopril to 40 mg Educational resources provided: brochure (denies ) Self management tools provided:   Other plans: check BMP and repeat BMP on follow up in 1 month

## 2017-07-11 NOTE — Assessment & Plan Note (Signed)
-   Patient noted to have an elevated TSH on last visit - Will recheck TSH and free T4 today

## 2017-07-11 NOTE — Progress Notes (Signed)
   Subjective:    Patient ID: Nicole Baxter, female    DOB: 08/30/1941, 76 y.o.   MRN: [id number]  Medication Refill  Associated symptoms include headaches.    I have seen and examined this patient. She is here for routine follow up of her HTN.  Patient states that she also has noted a decreased appetite and has lost 10 lbs over the last month. She has not been taking her Ensure either. She also complains of intermittent headaches which is resolved with tylenol.  She denies any other complaints at this time.    Review of Systems  Constitutional: Negative for unexpected weight change.  HENT: Negative.   Respiratory: Negative.   Cardiovascular: Negative.   Gastrointestinal: Negative.   Musculoskeletal: Negative.   Skin: Negative.   Neurological: Positive for tremors and headaches.  Psychiatric/Behavioral: Negative.        Objective:   Physical Exam  Constitutional: She is oriented to person, place, and time. She appears well-developed and well-nourished.  HENT:  Head: Normocephalic and atraumatic.  Mouth/Throat: No oropharyngeal exudate.  Neck: Neck supple.  Cardiovascular: Normal rate, regular rhythm and normal heart sounds.   Pulmonary/Chest: Effort normal and breath sounds normal. No respiratory distress. She has no wheezes.  Abdominal: Soft. Bowel sounds are normal. She exhibits no distension. There is no tenderness.  Musculoskeletal: Normal range of motion. She exhibits no edema.  Lymphadenopathy:    She has no cervical adenopathy.  Neurological: She is alert and oriented to person, place, and time.  Skin: Skin is warm. No rash noted. No erythema.  Psychiatric: She has a normal mood and affect. Her behavior is normal.          Assessment & Plan:  Please see problem based charting for assessment and plan:

## 2017-07-11 NOTE — Assessment & Plan Note (Signed)
-   Will obtain FIT testing today

## 2017-07-11 NOTE — Assessment & Plan Note (Signed)
-   This problem is chronic and stable - Patient states her tremors have improved - Will continue with current medications - She will follow up with Dr. Lucia GaskinsAhern

## 2017-07-11 NOTE — Assessment & Plan Note (Addendum)
-   Patient has 10 lb weight loss over the past month - States she has no appetite and has not been taking her Ensure - The etiology of this is unsure at this time - possibly secondary to her Parkinsonism versus underlying malignancy - She had a colonoscopy in 2016 and a mammogram last year - We will obtain a FIT test and check CBC today - If she continues to lose weight would consider checking HIV, Hep C and a malignancy work up - Will follow up in 1 month

## 2017-07-11 NOTE — Assessment & Plan Note (Signed)
-   This problem is chronic and stable - Patient states pain is well controlled with voltaren gel - Will refill voltaren gel today

## 2017-07-12 ENCOUNTER — Telehealth: Payer: Self-pay | Admitting: Internal Medicine

## 2017-07-12 DIAGNOSIS — Z0000 Encounter for general adult medical examination without abnormal findings: Secondary | ICD-10-CM | POA: Diagnosis not present

## 2017-07-12 DIAGNOSIS — R634 Abnormal weight loss: Secondary | ICD-10-CM | POA: Diagnosis not present

## 2017-07-12 LAB — CBC WITH DIFFERENTIAL/PLATELET
BASOS: 0 %
Basophils Absolute: 0 10*3/uL (ref 0.0–0.2)
EOS (ABSOLUTE): 0.3 10*3/uL (ref 0.0–0.4)
Eos: 5 %
HEMOGLOBIN: 14 g/dL (ref 11.1–15.9)
Hematocrit: 40.6 % (ref 34.0–46.6)
IMMATURE GRANS (ABS): 0 10*3/uL (ref 0.0–0.1)
Immature Granulocytes: 0 %
LYMPHS ABS: 1.9 10*3/uL (ref 0.7–3.1)
LYMPHS: 33 %
MCH: 32.9 pg (ref 26.6–33.0)
MCHC: 34.5 g/dL (ref 31.5–35.7)
MCV: 96 fL (ref 79–97)
MONOCYTES: 7 %
Monocytes Absolute: 0.4 10*3/uL (ref 0.1–0.9)
NEUTROS ABS: 3.3 10*3/uL (ref 1.4–7.0)
Neutrophils: 55 %
Platelets: 170 10*3/uL (ref 150–379)
RBC: 4.25 x10E6/uL (ref 3.77–5.28)
RDW: 13.7 % (ref 12.3–15.4)
WBC: 5.9 10*3/uL (ref 3.4–10.8)

## 2017-07-12 LAB — T4, FREE: FREE T4: 1.02 ng/dL (ref 0.82–1.77)

## 2017-07-12 LAB — TSH: TSH: 6.64 u[IU]/mL — ABNORMAL HIGH (ref 0.450–4.500)

## 2017-07-12 NOTE — Telephone Encounter (Signed)
I called patient to discuss results of her blood work. She was noted to have a normal CBC and subclinical hypothyroidism. I discussed this with the patient. I explained that I am not inclined to start her on medication for her sub clinical hypothyroidism at this point and will continue to monitor for now. She is in agreement with plan

## 2017-07-18 ENCOUNTER — Other Ambulatory Visit: Payer: PPO

## 2017-07-18 DIAGNOSIS — R634 Abnormal weight loss: Secondary | ICD-10-CM

## 2017-07-18 DIAGNOSIS — Z0000 Encounter for general adult medical examination without abnormal findings: Secondary | ICD-10-CM

## 2017-07-24 LAB — FECAL OCCULT BLOOD, IMMUNOCHEMICAL: FECAL OCCULT BLD: NEGATIVE

## 2017-08-08 ENCOUNTER — Ambulatory Visit (INDEPENDENT_AMBULATORY_CARE_PROVIDER_SITE_OTHER): Payer: PPO | Admitting: Internal Medicine

## 2017-08-08 ENCOUNTER — Encounter: Payer: Self-pay | Admitting: Internal Medicine

## 2017-08-08 DIAGNOSIS — G20 Parkinson's disease: Secondary | ICD-10-CM

## 2017-08-08 DIAGNOSIS — Z6824 Body mass index (BMI) 24.0-24.9, adult: Secondary | ICD-10-CM

## 2017-08-08 DIAGNOSIS — I10 Essential (primary) hypertension: Secondary | ICD-10-CM

## 2017-08-08 DIAGNOSIS — R634 Abnormal weight loss: Secondary | ICD-10-CM | POA: Diagnosis not present

## 2017-08-08 DIAGNOSIS — M170 Bilateral primary osteoarthritis of knee: Secondary | ICD-10-CM

## 2017-08-08 DIAGNOSIS — Z0000 Encounter for general adult medical examination without abnormal findings: Secondary | ICD-10-CM

## 2017-08-08 DIAGNOSIS — Z79899 Other long term (current) drug therapy: Secondary | ICD-10-CM | POA: Diagnosis not present

## 2017-08-08 DIAGNOSIS — Z23 Encounter for immunization: Secondary | ICD-10-CM

## 2017-08-08 MED ORDER — LISINOPRIL 40 MG PO TABS: 40.0000 mg | ORAL_TABLET | Freq: Every day | ORAL | 0 refills | Status: DC

## 2017-08-08 MED ORDER — DICLOFENAC SODIUM 1 % TD GEL: TRANSDERMAL | 1 refills | Status: DC

## 2017-08-08 NOTE — Patient Instructions (Signed)
-   It was a pleasure seeing you today - Please follow up in 3 months - your BP is better. Continue with your current medications - I have refilled your voltaren gel - Your weight has improved. Keep up the great work - We will check some blood work on you today and give you a flu shot

## 2017-08-09 ENCOUNTER — Encounter: Payer: Self-pay | Admitting: Internal Medicine

## 2017-08-09 LAB — BMP8+ANION GAP
ANION GAP: 16 mmol/L (ref 10.0–18.0)
BUN/Creatinine Ratio: 14 (ref 12–28)
BUN: 11 mg/dL (ref 8–27)
CALCIUM: 8.9 mg/dL (ref 8.7–10.3)
CO2: 24 mmol/L (ref 20–29)
CREATININE: 0.81 mg/dL (ref 0.57–1.00)
Chloride: 105 mmol/L (ref 96–106)
GFR calc Af Amer: 83 mL/min/{1.73_m2} (ref >59)
GFR, EST NON AFRICAN AMERICAN: 72 mL/min/{1.73_m2} (ref >59)
Glucose: 89 mg/dL (ref 65–99)
Potassium: 3.5 mmol/L (ref 3.5–5.2)
Sodium: 145 mmol/L — ABNORMAL HIGH (ref 134–144)

## 2017-08-09 NOTE — Assessment & Plan Note (Signed)
BP Readings from Last 3 Encounters:  08/08/17 (!) 142/75  07/11/17 (!) 167/74  05/09/17 136/77    Lab Results  Component Value Date   NA 145 (H) 08/08/2017   K 3.5 08/08/2017   CREATININE 0.81 08/08/2017    Assessment: Blood pressure control:  fair Progress toward BP goal:   improved Comments: Patient is compliant with lisinopril 40 mg  Plan: Medications:  continue current medications Educational resources provided: brochure (denies need ) Self management tools provided:   Other plans: Check BMP today

## 2017-08-09 NOTE — Assessment & Plan Note (Signed)
-   This problem is chronic and stable - Patient states that her symptoms are well-controlled with Voltaren gel - I will refill this for her today

## 2017-08-09 NOTE — Assessment & Plan Note (Signed)
-   flu shot given today 

## 2017-08-09 NOTE — Assessment & Plan Note (Signed)
-    Patient follows up with Dr. Lucia GaskinsAhern for this - States that her symptoms are stable and she is compliant with her Sinemet and Exelon - Her next appointment is in January with her neurologist. We will continue with her current management for now

## 2017-08-09 NOTE — Assessment & Plan Note (Signed)
-   Patient's weight is now much improved since her last visit - She is up 9 pounds now from 1 month ago - She states that her appetite has improved and she is taking supplemental shakes - We'll hold off on further workup at this time given her improvement in weight - We'll follow-up with her in 3 months

## 2017-08-09 NOTE — Progress Notes (Signed)
   Subjective:    Patient ID: Nicole Baxter, female    DOB: 05/18/1941, 76 y.o.   MRN: [id number]  HPI  I seen and examined this patient. Patient is here for routine follow-up of her weight loss as well as her hypertension.  Patient states that since her last visit her appetite is improved and she is taking supplemental shakes. She denies any other acute complaints at this time and is compliant with all her medications   Review of Systems  Constitutional: Negative.   HENT: Negative.   Respiratory: Negative.   Cardiovascular: Negative.   Gastrointestinal: Negative.   Musculoskeletal: Negative.   Skin: Negative.   Neurological: Positive for tremors. Negative for seizures and light-headedness.  Psychiatric/Behavioral: Negative.        Objective:   Physical Exam  Constitutional: She is oriented to person, place, and time. She appears well-developed and well-nourished.  HENT:  Head: Normocephalic and atraumatic.  Mouth/Throat: No oropharyngeal exudate.  Neck: Neck supple.  Cardiovascular: Normal rate, regular rhythm and normal heart sounds.   Pulmonary/Chest: Effort normal and breath sounds normal. No respiratory distress. She has no wheezes.  Abdominal: Soft. Bowel sounds are normal. She exhibits no distension. There is no tenderness.  Musculoskeletal: Normal range of motion. She exhibits no edema.  Lymphadenopathy:    She has no cervical adenopathy.  Neurological: She is alert and oriented to person, place, and time.  Skin: Skin is warm. No rash noted. No erythema.  Psychiatric: She has a normal mood and affect. Her behavior is normal.          Assessment & Plan:  Please see problem based charting for assessment and plan:

## 2017-08-15 ENCOUNTER — Ambulatory Visit: Payer: PPO | Admitting: Adult Health

## 2017-08-18 ENCOUNTER — Telehealth: Payer: Self-pay | Admitting: Internal Medicine

## 2017-08-18 DIAGNOSIS — G8929 Other chronic pain: Secondary | ICD-10-CM

## 2017-08-18 DIAGNOSIS — M545 Low back pain: Principal | ICD-10-CM

## 2017-08-18 DIAGNOSIS — G20 Parkinson's disease: Secondary | ICD-10-CM

## 2017-08-18 NOTE — Telephone Encounter (Signed)
LM to call us back (Need to inform to take Meloxicam daily AS NEEDED)

## 2017-08-21 NOTE — Telephone Encounter (Signed)
Pt is returning a call. Please call pt back. 

## 2017-08-22 NOTE — Telephone Encounter (Signed)
Spoke with pt and made her aware that meloxicam was to be used as needed. There was some confusion about which medication this was.  Pt wishes to bring in all her medications tomorrow and have someone go over them with her.  Will see if DrKim will be able to assist with this request.

## 2017-08-23 ENCOUNTER — Encounter: Payer: Self-pay | Admitting: Pharmacist

## 2017-08-23 ENCOUNTER — Ambulatory Visit: Payer: PPO | Admitting: Pharmacist

## 2017-08-23 DIAGNOSIS — Z79899 Other long term (current) drug therapy: Secondary | ICD-10-CM

## 2017-08-23 NOTE — Progress Notes (Signed)
Medications were reviewed with the patient, including name, instructions, indication, goals of therapy, potential side effects, importance of adherence, and safe use.  Patient did not bring lisinopril but states she is taking it. She reports she has been having dizziness which started within the past few weeks but does not associate this with medication since she has had no new medication changes. Patient does admit to a poor diet, does not eat breakfast daily and sometimes misses lunch and dinner. She attributes this to poor appetite. Advised patient to try to eat 3 meals daily, and also start monitoring BP at home when dizziness occurs (states she has a BP monitor at home). Also advised patient to contact clinic if symptoms persist or worsen. Patient verbalized understanding by repeating back information. Patient was also provided an information handout.

## 2017-08-23 NOTE — Telephone Encounter (Signed)
Dr. Kim saw pt this am.

## 2017-09-02 ENCOUNTER — Other Ambulatory Visit: Payer: Self-pay | Admitting: Internal Medicine

## 2017-09-02 DIAGNOSIS — I10 Essential (primary) hypertension: Secondary | ICD-10-CM

## 2017-09-04 ENCOUNTER — Other Ambulatory Visit: Payer: Self-pay | Admitting: *Deleted

## 2017-09-04 DIAGNOSIS — I10 Essential (primary) hypertension: Secondary | ICD-10-CM

## 2017-09-04 MED ORDER — LISINOPRIL 40 MG PO TABS: 40.0000 mg | ORAL_TABLET | Freq: Every day | ORAL | 0 refills | Status: DC

## 2017-10-27 ENCOUNTER — Other Ambulatory Visit: Payer: Self-pay | Admitting: Internal Medicine

## 2017-10-27 DIAGNOSIS — J4520 Mild intermittent asthma, uncomplicated: Secondary | ICD-10-CM

## 2017-10-27 NOTE — Telephone Encounter (Signed)
Patient requesting refill on inhaler.

## 2017-10-28 ENCOUNTER — Other Ambulatory Visit: Payer: Self-pay | Admitting: Internal Medicine

## 2017-10-28 DIAGNOSIS — I10 Essential (primary) hypertension: Secondary | ICD-10-CM

## 2017-10-30 MED ORDER — LISINOPRIL 40 MG PO TABS: 40.0000 mg | ORAL_TABLET | Freq: Every day | ORAL | 0 refills | Status: DC

## 2017-10-31 ENCOUNTER — Ambulatory Visit: Payer: PPO | Admitting: Internal Medicine

## 2017-10-31 VITALS — BP 150/75 | HR 68 | Temp 98.2°F | Wt 129.2 lb

## 2017-10-31 DIAGNOSIS — G20 Parkinson's disease: Secondary | ICD-10-CM | POA: Diagnosis not present

## 2017-10-31 DIAGNOSIS — J4520 Mild intermittent asthma, uncomplicated: Secondary | ICD-10-CM

## 2017-10-31 DIAGNOSIS — I10 Essential (primary) hypertension: Secondary | ICD-10-CM | POA: Diagnosis not present

## 2017-10-31 DIAGNOSIS — R42 Dizziness and giddiness: Secondary | ICD-10-CM | POA: Diagnosis not present

## 2017-10-31 DIAGNOSIS — R51 Headache: Secondary | ICD-10-CM

## 2017-10-31 DIAGNOSIS — E785 Hyperlipidemia, unspecified: Secondary | ICD-10-CM | POA: Diagnosis not present

## 2017-10-31 MED ORDER — ALBUTEROL SULFATE HFA 108 (90 BASE) MCG/ACT IN AERS: 2.0000 | INHALATION_SPRAY | Freq: Four times a day (QID) | RESPIRATORY_TRACT | 2 refills | Status: AC | PRN

## 2017-10-31 NOTE — Assessment & Plan Note (Signed)
Patient experiences increased wheezing and SOB when in cold weather, which has worsened in recent months. She has been using albuterol inhaler intermittently (once in past week) and recently ran out. No wheezing on exam today. No complaints of SOB, daily nighttime cough, or daily wheezing. Will provide refill for rescue medication today.  Plan: -Refill albuterol inhaler today to preferred pharmacy -RTC in 1-3 months for evaluation with PCP

## 2017-10-31 NOTE — Progress Notes (Signed)
Internal Medicine Clinic Attending  I saw and evaluated the patient.  I personally confirmed the key portions of the history and exam documented by Dr. Saunders RevelNedrud and I reviewed pertinent patient test results.  The assessment, diagnosis, and plan were formulated together and I agree with the documentation in the resident's note. Ms Nicole Baxter was not here for weight loss (has her PCP appt for that) but we noticed it. Pt states weight loss has lessened now. BMI normal. Remeron might be an option if weight loss cont - does not interact with her other meds.

## 2017-10-31 NOTE — Patient Instructions (Addendum)
FOLLOW-UP INSTRUCTIONS When: 1-3 months with PCP For: management of chronic medical conditions What to bring: prescription medications  Thank you for seeing us in the clinic today!  You were evaluated for high blood pressure. You were given refills on your blood pressure and asthma medications today.   Please return to the clinic in 1-3 months for management of your chronic medical conditions with your primary care physician Dr. Heide SparkNarendra.  If you have any questions or concerns, please call our clinic at [phone] between 9am-5pm and after hours call [phone] and ask for the internal medicine resident on call. If you feel you are having a medical emergency please call 911.   Dr. Rozann LeschesMarybeth Balinda Heacock

## 2017-10-31 NOTE — Assessment & Plan Note (Signed)
Patient's BP elevated above goal of 140/90 today. Patient has not taken lisinopril 40 mg daily for some time, although patient is unsure of how long she has been out of medication. Appropriate refill provided yesterday and patient informed that she can pick up medication at preferred pharmacy.  Plan: -Refill lisinopril 40 mg daily -RTC 1-3 months for follow up with PCP and BP recheck

## 2017-10-31 NOTE — Progress Notes (Signed)
CC: Hypertension follow up  HPI:  Nicole Baxter is a 76 y.o. PMH of HTN, HLD, mild, intermittent asthma, and Parkinson's disease who is presenting for evaluation of high blood pressure. The patient is accompanied by her husband, who provides much of the history. The patient's husband made the appointment today because he checks her BP everyday and noticed that her readings over the past few days have been >150/90. The patient has chronic dizziness and headaches and states that these symptoms have not been worse recently. The patient thinks this chronic dizziness and headache is a result of decreased food intake, since her appetite has been poor and she often forgets to eat breakfast. Her symptoms typically improve with food intake and/or drinking Ensure. The patient denies focal weakness and changes in vision as a result of this high blood pressure. Patient's husband thinks she ran out of her blood pressure medications, lisinopril, but is unsure when this happened. Per chart review a refill for this medication was sent to the patient's pharmacy on 10/30/2017 (24 hours ago), but patient and husband unaware of this refill.   Patient also requesting refill on albuterol inhaler. She states that since the weather has become colder, her wheezing has increased. She uses this inhaler intermittently when traveling outside and it helps with your SOB that accompanies wheezing. She has used this inhaler once in past week.   Past Medical History: Past Medical History:  Diagnosis Date  . Arthritis   . GERD (gastroesophageal reflux disease)    pt. states she no longer has GERD (09/11/15)  . Hyperglycemia    isolated FBS 122, 95, 104 (Random 138)  . Hyperlipidemia    on pravastatin  . Hypertension    controlled on 2 agents  . 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 Obesity   . Parkinson's disease (tremor, stiffness, slow motion, unstable posture) (HCC)   . Shortness of breath dyspnea    pt. states that she no longer has SOB )09/11/15  . Tremor    Review of Systems:   Patient endorses chronic dizziness and headaches, intermittent wheezing, as per HPI Patient denies chest pain, shortness of breath, abdominal pain, diaphoresis, nausea/vomiting, lower extremity swelling, and change in bowel/bladder habits.  Physical Exam:  Vitals:   10/31/17 1020  BP: (!) 150/75  Pulse: 68  Temp: 98.2 F (36.8 C)  TempSrc: Oral  SpO2: 98%  Weight: 129 lb 3.2 oz (58.6 kg)   Physical Exam  Constitutional: She is oriented to person, place, and time.  Elderly woman sitting comfortably in wheelchair in no acute distress  Eyes: Conjunctivae and EOM are normal. Pupils are equal, round, and reactive to light.  Cardiovascular: Normal rate, regular rhythm and intact distal pulses.  No murmur heard. Respiratory: Effort normal. No respiratory distress. She has no wheezes.  GI: Soft. Bowel sounds are normal. She exhibits no distension. There is no tenderness. There is no rebound.  Musculoskeletal: She exhibits no edema (of bilateral lower extremities) or tenderness (of bilateral lower extremities).  Neurological: She is alert and oriented to person, place, and time.  Face strength and sensation intact bilaterally. Tongue midline. Patient has baseline tremor in upper extremities present throughout exam. Gross motor of upper and lower extremities intact bilaterally. Gross sensation to light touch of upper and lower extremities intact bilaterally.  Skin: Skin is warm and dry. No rash noted. No erythema.   Assessment & Plan:   See Encounters Tab for problem based charting.  Patient seen with Dr. Lynnae January

## 2017-11-02 NOTE — Telephone Encounter (Signed)
This was done 12/4

## 2017-11-24 ENCOUNTER — Ambulatory Visit: Payer: PPO | Admitting: Internal Medicine

## 2017-11-24 ENCOUNTER — Other Ambulatory Visit: Payer: Self-pay

## 2017-11-24 DIAGNOSIS — I10 Essential (primary) hypertension: Secondary | ICD-10-CM

## 2017-11-24 DIAGNOSIS — Z79899 Other long term (current) drug therapy: Secondary | ICD-10-CM | POA: Diagnosis not present

## 2017-11-24 DIAGNOSIS — I951 Orthostatic hypotension: Secondary | ICD-10-CM

## 2017-11-24 MED ORDER — LISINOPRIL 20 MG PO TABS: 40.0000 mg | ORAL_TABLET | Freq: Every day | ORAL | 0 refills | Status: DC

## 2017-11-24 NOTE — Patient Instructions (Signed)
[name],  I am going to decrease the dose of your lisinopril to 20 mg daily instead of the 40 mg daily. Take it at night before bed. Please keep checking your blood pressure at home and bring your log next visit. Please come back and see us in 2 weeks for follow up.

## 2017-11-24 NOTE — Progress Notes (Signed)
   CC: Elevated blood pressure  HPI:  [name] is a 76 y.o. female with a past medical history listed below here today with complaints of elevated blood pressure. She is accompanied by her husband who helps provide history.   They report that her blood pressure has been elevated when checking it at home. Patient's husband reports that when he has check it recently he has gotten 94/154. Upon repeated questioning he remains adamant that he has gotten readings of 94/154. Did not bring the meter with them today to check. She reports no symptoms when she has had these readings other than feeling hot on her forehead. She does however note that she does occasionally feel dizzy and lightheaded when walking which improves with sitting. Appears her most recent visit on 12/4 she had not been taking her lisinopril 40 mg daily at that time and it was restarted. BP was mildly elevated at that time to 150/75. She reports compliance with this medication today. Denies any symptoms today. Orthostatics positive today.    Past Medical History:  Diagnosis Date  . Arthritis   . GERD (gastroesophageal reflux disease)    pt. states she no longer has GERD (09/11/15)  . Hyperglycemia    isolated FBS 122, 95, 104 (Random 138)  . Hyperlipidemia    on pravastatin  . Hypertension    controlled on 2 agents  . 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 Obesity   . Parkinson's disease (tremor, stiffness, slow motion, unstable posture) (HCC)   . Shortness of breath dyspnea    pt. states that she no longer has SOB )09/11/15  . Tremor    Review of Systems:   Negative except as noted in HPI  Physical Exam:  Vitals:   11/24/17 1344  BP: (!) 159/75  Pulse: 67  Temp: 98.4 F (36.9 C)  TempSrc: Oral  SpO2: 98%  Weight: 130 lb 8 oz (59.2 kg)  Height: 5\' 1"  (1.549 m)   GENERAL- alert, co-operative, appears as stated age, not in any distress. CARDIAC- RRR, no murmurs, rubs or gallops. RESP-  Moving equal volumes of air, and clear to auscultation bilaterally, no wheezes or crackles. ABDOMEN- Soft, nontender, bowel sounds present. NEURO- No obvious Cr N abnormality. EXTREMITIES- pulse 2+, symmetric, no pedal edema. SKIN- Warm, dry, No rash or lesion. PSYCH- Normal mood and affect, appropriate thought content and speech.   Assessment & Plan:   See Encounters Tab for problem based charting.  Patient discussed with Dr. Sandre Kittyaines

## 2017-11-25 LAB — BMP8+ANION GAP
Anion Gap: 15 mmol/L (ref 10.0–18.0)
BUN/Creatinine Ratio: 13 (ref 12–28)
BUN: 12 mg/dL (ref 8–27)
CO2: 25 mmol/L (ref 20–29)
Calcium: 9.3 mg/dL (ref 8.7–10.3)
Chloride: 109 mmol/L — ABNORMAL HIGH (ref 96–106)
Creatinine, Ser: 0.91 mg/dL (ref 0.57–1.00)
GFR calc Af Amer: 72 mL/min/{1.73_m2} (ref >59)
GFR calc non Af Amer: 62 mL/min/{1.73_m2} (ref >59)
Glucose: 80 mg/dL (ref 65–99)
Potassium: 4.3 mmol/L (ref 3.5–5.2)
Sodium: 149 mmol/L — ABNORMAL HIGH (ref 134–144)

## 2017-11-29 NOTE — Assessment & Plan Note (Addendum)
BP Readings from Last 3 Encounters:  11/24/17 (!) 159/75  10/31/17 (!) 150/75  08/08/17 (!) 142/75    Lab Results  Component Value Date   NA 149 (H) 11/24/2017   K 4.3 11/24/2017   CREATININE 0.91 11/24/2017   See HPI for details. Orthostatics positive in clinic.  A/P: Difficult to tell if she is truly hypertensive or hypotensive at home based on report from patient and husband. However, she does appear to be mildly hypertensive here and is grossly positive on orthostatics. Will decrease her dose of lisinopril to 20 mg daily. RTC in 2 weeks for follow-up. Asked them to keep a bp log and bring it to visit.   BMET today with Na 149. Free water deficit of 1.7L. Recommend increase water intake. Recheck at follow up.

## 2017-11-30 NOTE — Progress Notes (Signed)
Internal Medicine Clinic Attending  Case discussed with Dr. Boswell  at the time of the visit.  We reviewed the resident's history and exam and pertinent patient test results.  I agree with the assessment, diagnosis, and plan of care documented in the resident's note.  Alexander N Raines, MD   

## 2017-12-08 ENCOUNTER — Encounter: Payer: Self-pay | Admitting: Internal Medicine

## 2017-12-08 ENCOUNTER — Ambulatory Visit (INDEPENDENT_AMBULATORY_CARE_PROVIDER_SITE_OTHER): Payer: PPO | Admitting: Internal Medicine

## 2017-12-08 ENCOUNTER — Other Ambulatory Visit: Payer: Self-pay

## 2017-12-08 VITALS — BP 160/74 | HR 58 | Temp 98.3°F | Ht 61.0 in | Wt 128.9 lb

## 2017-12-08 DIAGNOSIS — Z79899 Other long term (current) drug therapy: Secondary | ICD-10-CM | POA: Diagnosis not present

## 2017-12-08 DIAGNOSIS — I10 Essential (primary) hypertension: Secondary | ICD-10-CM

## 2017-12-08 DIAGNOSIS — G20 Parkinson's disease: Secondary | ICD-10-CM | POA: Diagnosis not present

## 2017-12-08 LAB — BASIC METABOLIC PANEL
Anion gap: 10 (ref 5–15)
BUN: 10 mg/dL (ref 6–20)
CO2: 26 mmol/L (ref 22–32)
CREATININE: 0.78 mg/dL (ref 0.44–1.00)
Calcium: 9.1 mg/dL (ref 8.9–10.3)
Chloride: 106 mmol/L (ref 101–111)
GFR calc Af Amer: >60 mL/min (ref >60)
GFR calc non Af Amer: >60 mL/min (ref >60)
Glucose, Bld: 88 mg/dL (ref 65–99)
Potassium: 3.5 mmol/L (ref 3.5–5.1)
Sodium: 142 mmol/L (ref 135–145)

## 2017-12-08 NOTE — Assessment & Plan Note (Addendum)
Patient was seen twice in December for hypertension management.  On the first occasion her blood pressure was elevated in the setting of running out of her lisinopril 40 mg which was refilled at the time.  Decreased p.o. intake also noted at that visit.  On follow-up, the patient's blood pressure remained mildly elevated, but orthostatic vital signs were also positive and sodium was mildly elevated.  Her lisinopril dose was decreased to 20 mg and p.o. intake was encouraged.  Today, her orthostatic symptoms have improved though her blood pressure is again elevated at 160/74, consistent with reported values at home.  Her underlying Parkinson disease may be leading to autonomic dysfunction causing difficult to control blood pressure without causing dizziness and leading to a high risk of falls especially given her impaired mobility at baseline. No other obvious medications that could contribute to hypotension or dizziness on med list.  At this point, we will allow for slight elevation in blood pressure in order to continue to avoid orthostatic symptoms.  She was counseled on importance of hydration, caution with position changes, and can also try compression stockings.  In the future she may need further medication adjustments  if autonomic dysfunction progresses.  Na on repeat labs today has improved to wnl at 142  --Cont Lisinopril 20 mg daily and BP log

## 2017-12-08 NOTE — Patient Instructions (Addendum)
Nice to meet you today [name].  Your blood pressure is tricky because it can run a little high but you also can get dizzy if you are not well-hydrated and with your Parkinson's.  For now, we will keep the same dose of lisinopril and be okay with your blood pressure a little on the high side to avoid too much dizziness or causing a fall. To continue to avoid and improve the dizziness, be sure to try to stay hydrated, you can also wear compression stockings, and be careful and take your time with position changes. You have an appointment coming up with Dr. Heide SparkNarendra in March but if anything worsens, let us know and we can make an appointment for sooner. Wwe are also going to check labs today to double check your sodium    With seeing things, this is a fairly common symptom of Parkinson's and with your medications may need to be adjusted.  We will defer this to the neurologist for the appointment you have this coming Tuesday.

## 2017-12-08 NOTE — Progress Notes (Signed)
   CC: Hypertension follow up, visual hallucinations    HPI:  Nicole Baxter is a 77 y.o. female with a past medical history as described below who presents to the clinic for follow-up of hypertension and visual hallucinations.  HTN: She was seen in the clinic on 12/28 for management of her blood pressure.  At the time she was mildly hypertensive but with positive orthostatics and and Na of 149.  Her lisinopril increased from 40 mg to 20 mg daily and increased p.o. water hydration encouraged. Since then she has increased po intake with food, less so with fluids. She notes her dizziness has improved but still occurs intermittently several times per week. Her BP has been running mainly in 150s and 160s but they forgot the written values at home.  Visual Hallucinations: She reports regularly seeing people outside of her house for the last several months but when she tells her husband, he notes that nobody is outside. The hallucinations often revolve around people trying to break in to their truck parked outside. She denies any significant changes in mood or distress.   Past Medical History:  Diagnosis Date  . Arthritis   . GERD (gastroesophageal reflux disease)    pt. states she no longer has GERD (09/11/15)  . Hyperglycemia    isolated FBS 122, 95, 104 (Random 138)  . Hyperlipidemia    on pravastatin  . Hypertension    controlled on 2 agents  . 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 Obesity   . Parkinson's disease (tremor, stiffness, slow motion, unstable posture) (HCC)   . Shortness of breath dyspnea    pt. states that she no longer has SOB )09/11/15  . Tremor    Review of Systems:  Review of Systems  Neurological: Positive for dizziness and tremors. Negative for loss of consciousness.  Psychiatric/Behavioral: Positive for hallucinations. Negative for depression.     Physical Exam:  Vitals:   12/08/17 1007  BP: (!) 160/74  Pulse: (!) 58  Temp: 98.3 F (36.8  C)  TempSrc: Oral  SpO2: 100%  Weight: 128 lb 14.4 oz (58.5 kg)  Height: 5\' 1"  (1.549 m)   General: Elderly female sitting in wheelchair, no acute distress HEENT: Normal conjuctiva CV: RRR  Resp: Clear breath sounds bilaterally, normal work of breathing, no distress  Extr: No LE edema  Neuro: Alert, pill rolling tremor R>L  Skin: Warm, dry      Assessment & Plan:   See Encounters Tab for problem based charting.  Patient discussed with Dr. Oswaldo DoneVincent

## 2017-12-09 ENCOUNTER — Encounter: Payer: Self-pay | Admitting: Internal Medicine

## 2017-12-09 NOTE — Assessment & Plan Note (Signed)
She has a history of Parkinson disease which is managed's with neurology.  She is currently on Sinemet and Exelon.  Today, she reports recent onset of visual hallucinations.  This is a common manifestation of Parkinson's disease and may require medication or dose adjustments.  At this point, the hallucinations do not lead to significant distress.  She has scheduled follow-up for early next week with neurology and we will defer any changes or intervention.  --Keep Neurology appt

## 2017-12-11 NOTE — Progress Notes (Signed)
Internal Medicine Clinic Attending  Case discussed with Dr. Harden at the time of the visit.  We reviewed the resident's history and exam and pertinent patient test results.  I agree with the assessment, diagnosis, and plan of care documented in the resident's note.  

## 2017-12-12 ENCOUNTER — Encounter: Payer: Self-pay | Admitting: Adult Health

## 2017-12-12 ENCOUNTER — Ambulatory Visit (INDEPENDENT_AMBULATORY_CARE_PROVIDER_SITE_OTHER): Payer: PPO | Admitting: Adult Health

## 2017-12-12 VITALS — BP 126/81 | HR 74 | Wt 130.4 lb

## 2017-12-12 DIAGNOSIS — R269 Unspecified abnormalities of gait and mobility: Secondary | ICD-10-CM

## 2017-12-12 DIAGNOSIS — R443 Hallucinations, unspecified: Secondary | ICD-10-CM

## 2017-12-12 DIAGNOSIS — G20 Parkinson's disease: Secondary | ICD-10-CM | POA: Diagnosis not present

## 2017-12-12 NOTE — Progress Notes (Signed)
Nicole Baxter: Nicole Baxter DOB: 10/07/1941  REASON FOR VISIT: follow up HISTORY FROM: Nicole Baxter  HISTORY OF PRESENT ILLNESS: Today 12/12/17 Nicole Baxter is a 77 year old female with a history of Parkinson's disease.  She returns today for follow-up.  At the last visit Sinemet was increased to 2 tablets 3 times a day.  They report that she is tolerating the medications well.  She reports that her tremor is better.  They report the gait is unsteady.  She had a fall approximately 2 weeks ago.  She was not using her walker or a cane.  She did fall forward and busted her lip.  She denies any trouble swallowing.  Denies any trouble sleeping.  She does report that she is having hallucinations.  Her husband states that when she looks out the window she sees people getting in her car.  She typically thinks that they are going to steal the car.  The husband does report that she does understand that she is the only one seeing these people.  They report that this is been ongoing for a couple months.  I do not feel that it is medication related.  She returns today for an evaluation.  HISTORY Interval history: She took her medication this morning and still has symptoms. But it is improved. She can tell the difference. No side effects. Will increase to 2 pills three times a day 4 hours a part try to separate from protein. No falls. Swallowing ok, no choking. She talks in her sleep. Discussed REM sleep disorder. No side effects. Daughter not here today.   Interval history 01/09/2017: Daughter notices the sinemet working and lasts until the next dose. No falls. She is not drinking water but she is drinking sprite 2 glasses a day. Discussed this is not enough. She completed physical therapy and she feels this helped. She has not taken her medication today and has a resting tremor. Gait is better after physical therapy. Memory is stable.She can remember her grandchildren's names today. B12 a month ago was much improved to over  900 (from 185).   Interval history 10/08/2016: Nicole Baxter and husband are here today with daughter for follow-up of Parkinson's disease and dementia. She has a past medical history of recently diagnosed hyperthyroidism(treated, last tsh wnl), essential tremor, hypertension, asthma, hyperlipidemia, diabetes, osteoarthritis. No history of dopamine blocking agents. 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 Sinemet helps her resting tremor however she forgets to take it.  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 Nicole Baxter has not been noncompliant with medications or directions. Today fortunately she is here with her daughter who is going to be more involved. Reviewed with daughter that I do feel that carbidopa/levodopa is the best medication for Nicole Baxter unfortunately in the past she would take 1 pill a day or stop taking it all together even though she reported it would help when taking it. Reviewed the history of medications tried with mother and discussedParkinson's disease. We'll try Sinemet again, discussed with daughter that she should be involved with Nicole Baxter's medication management, she should set up a pill dispenser and also visit multiple times a week or hire somebody to manage daily compliance. Nicole Baxter is a fall risk and this is a concern.  Interval  history 02/01/2016:She feels the medication is helping. She has not taken the Sinemet since yesterday. When she takes the sinemet the tremor improves. She takes it 3x a day. Here with husband. Giving ut to her at 10am, 1pm and 8 pm. Advised to take it every 4 hours. Wears off in 2-3 hours. Discussed trying a patch every day to see if that helps with medication  administration. When she takes the medication the tremor improves and then it wears off in 3 hours. No falls. She forgets to take the medication. She is having some hallucinations. She was seeing people that were not there and babies not there. Her memory is stable. She has drooling. Drool on her pillow and sometimes during the day and it starts dripping. No difficulty swallowing. No choking on foods. No falls. Can continue to take the Sinemet.   Interval History 11/03/2015: here for follow up of Parkinsonism. She says she ran out of the prescription a few days ago and she has not taken the Sinemet in a few days. It really helps when she takes it. When she takes the Sinemet, the tremors stop. No side effects from the Sinemet. She feels the Sinemet lasts until the next dose. She has memory loss. We had ordered an MRI of the brain and she tried to do it but got claustrophobic. We need to get an MRi of the brain due to parkinsonism and memory loss. Will order and give some Xanax before going in to an open MRI. She has a manual wheelchair, we tried to get her a power wheelchair but it was too expensive. She feels much improved since being discharged from the hospital and starting B12 injections. B12 was 185 and she is having injections. Last thyroid was normal. Will do a MoCA today and start Rivastigmine for memory loss. Denies any confusion or altered mentation recently. They take the sinemet at 9am then 3or 4pm then 9pm. Discussed should take it every 4 hours. 9am, 1pm and 5pm. Do not take it with protein. No falls. No problems swallowing, not choking on food. No dyskinesias.  Addendum 07/22/2015: Called to see how she is doing. She hasn't taken the Sinemet since yesterday. Not taking it three times a day as prescribed. Sounds like it is difficullt for Nicole Baxter to remember to take her medication. She does say when she takes the medication it helps.   Interval update 06/24/2015:They are taking 1/2 pill of the  sinemet with breakfast. She forgot she is not supposed to take it with food. It helps the tremor. No side effects from the Sinemet. Will increase the sinemet to a whole pill three times a day. She did not take her sinemet this morning. Symptoms brain they're Sinemet with some the next time they come, we can take it in clinic and evaluate response. But if the Sinemet appears to help with her tremor, and this confirms the diagnosis of Parkinson's. May consider adding Azilect concurrently.  Interval update 05/12/2014: She had the radioactive iodine treatment and tremor is much improved. She still has bilateral upper resting tremor. She says she is walking a little better but husband disagrees. Husband says her knee hurts and stays sore. Husband feels her voice volume is lower and he can't hear her. She shuffles. Her sense of smell is poor. She is off balance.   We'll start Sinemet low dose. They are to call me back in 2 weeks to discuss how Nicole Baxter is responding. Discussed common side effects including nausea and  GI side effects, orthostatic hypotension, dizziness and increased risk of falls. They are to stop the medication if significant side effects occur.   04/14/2015: Nicole Baxter is a 77 y.o. female here as a referral from Dr. Andrey Campanile for tremor.She has a past medical history of recently diagnosed hyperthyroidism. She is here with her husband who provides much of the information. She also has a past medical history of essential tremor, hypertension, asthma, hyperlipidemia, asthma, diabetes, osteoarthritis. No history of dopamine blocking agents. 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  She has a daily tremor. She had a tremor in the past, similar tremor but it went away. It came back last year. She has good days and bad days with the tremor.  Her right leg sometimes won't move. There are some days when it is better. She shuffles and is slow walking. She can't move when walking sometimes, she freezes. The tone of her voice is lower, husband says she used to talk much louder. She doesn't yell anymore. She denies decreased smell sensation. She has constipation. Her hand writing is terrible. She is able to eat with her tremor. Tremor is better with movement. Worse at rest. Answer legs shake, feels like nervousness. Mother has a tremor. Cousins have tremors so they think it is a family issue. She is hyperthyroid and is going to [facility] for treatment. No FHx of PD. She uses a walker at baseline. Walking has worsened.  Reviewed notes, labs and imaging from outside physicians, which showed: Notes state the Nicole Baxter has been seen by neurology in the past however I cannot find a note in Epic. She follows with Dr. Andrey Campanile who notes resting tremor, rigidity and bradykinesia concerning for Parkinson's disease. She was started on gabapentin 100 mg 3 times a day and referred to neurology. Notes state that years ago she was seen by neurologist and advised to take Benadryl for tremor. Dr. Andrey Campanile advised her to stop due to risk of anticholinergic symptoms in the elderly. TSH decreased 0.132 weeks ago, liver function tests with elevated AST and a LT, BMP unremarkable   REVIEW OF SYSTEMS: Out of a complete 14 system review of symptoms, the Nicole Baxter complains only of the following symptoms, and all other reviewed systems are negative.  ALLERGIES: No Known Allergies  HOME MEDICATIONS: Outpatient Medications Prior to Visit  Medication Sig Dispense Refill  . albuterol (PROVENTIL HFA;VENTOLIN HFA) 108 (90 Base) MCG/ACT inhaler Inhale 2 puffs into the lungs every 6 (six) hours as needed for wheezing or shortness of breath. 1 Inhaler 2  . atorvastatin (LIPITOR) 40 MG tablet Take 1 tablet (40 mg total) by mouth daily. Nicole Baxter requested easy open caps for all  medication bottles. 30 tablet 3  . Calcium Carbonate-Vitamin D 600-400 MG-UNIT tablet Take 2 tablets by mouth daily. 180 tablet 1  . carbidopa-levodopa (SINEMET) 25-100 MG tablet Take 2 pills 3 times a day.Take 4 hours apart.  May take an extra dose in the evenings. 320 tablet 11  . cyanocobalamin ([facility] VITAMIN B12) 1000 MCG tablet Take 1 tablet (1,000 mcg total) by mouth daily. 30 tablet 1  . diclofenac sodium (VOLTAREN) 1 % GEL Apply to affected area 3 times a day as needed. 100 g 1  . lisinopril (PRINIVIL,ZESTRIL) 20 MG tablet Take 2 tablets (40 mg total) by mouth daily. 90 tablet 0  . meloxicam (MOBIC) 7.5  MG tablet Take 1 tablet (7.5 mg total) by mouth daily as needed. 90 tablet 0  . rivastigmine (EXELON) 4.5 MG capsule Take 1 capsule (4.5 mg total) by mouth 2 (two) times daily. 60 capsule 11   No facility-administered medications prior to visit.     PAST MEDICAL HISTORY: Past Medical History:  Diagnosis Date  . Arthritis   . GERD (gastroesophageal reflux disease)    pt. states she no longer has GERD (09/11/15)  . Hyperglycemia    isolated FBS 122, 95, 104 (Random 138)  . Hyperlipidemia    on pravastatin  . Hypertension    controlled on 2 agents  . 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 Obesity   . Parkinson's disease (tremor, stiffness, slow motion, unstable posture) (HCC)   . Shortness of breath dyspnea    pt. states that she no longer has SOB )09/11/15  . Tremor     PAST SURGICAL HISTORY: Past Surgical History:  Procedure Laterality Date  . CESAREAN SECTION    . COLONOSCOPY    . TOTAL KNEE ARTHROPLASTY Left 09/23/2015   Procedure: LEFT TOTAL KNEE ARTHROPLASTY;  Surgeon: Loreta Ave, MD;  Location: [facility] OR;  Service: Orthopedics;  Laterality: Left;    FAMILY HISTORY: Family History  Problem Relation Age of Onset  . Heart disease Mother 83       died of MI @ 13  . Alcohol abuse Father   . Cancer Brother   . Colon cancer Neg Hx     SOCIAL  HISTORY: Social History   Socioeconomic History  . Marital status: Married    Spouse name: Not on file  . Number of children: 5  . Years of education: 46  . Highest education level: Not on file  Social Needs  . Financial resource strain: Not on file  . Food insecurity - worry: Not on file  . Food insecurity - inability: Not on file  . Transportation needs - medical: Not on file  . Transportation needs - non-medical: Not on file  Occupational History  . Not on file  Tobacco Use  . Smoking status: Never Smoker  . Smokeless tobacco: Never Used  Substance and Sexual Activity  . Alcohol use: No    Alcohol/week: 0.0 oz  . Drug use: No  . Sexual activity: No  Other Topics Concern  . Not on file  Social History Narrative   Worked at [employer] 31 years. Married to [place] .Active, out and about q day.   5 children, 1 with sarcoidosis, 1 with COPD.   Caffeine use: Drinks a 6 pack of soda per week   Right-handed      PHYSICAL EXAM  Vitals:   12/12/17 1256  BP: 126/81  Pulse: 74  Weight: 130 lb 6.4 oz (59.1 kg)   Body mass index is 24.64 kg/m.  Generalized: Well developed, in no acute distress   Neurological examination  Mentation: Alert oriented to time, place, history taking. Follows all commands speech and language fluent Cranial nerve II-XII: Pupils were equal round reactive to light. Extraocular movements were full, visual field were full on confrontational test. Facial sensation and strength were normal. Uvula tongue midline. Head turning and shoulder shrug  were normal and symmetric. Motor: The motor testing reveals 5 over 5 strength of all 4 extremities. Good symmetric motor tone is noted throughout.  Moderate to severe impairment of finger taps.  Moderate to severe impairment of toe taps. Sensory: Sensory testing  is intact to soft touch on all 4 extremities. No evidence of extinction is noted.  Coordination: Cerebellar testing reveals good finger-nose-finger and  heel-to-shin bilaterally.  Gait and station: Nicole Baxter requires assistance with standing.  Gait is unsteady.  Decreased stride.  She uses a cane when ambulating.  Tandem gait not attempted. Reflexes: Deep tendon reflexes are symmetric and normal bilaterally.   DIAGNOSTIC DATA (LABS, IMAGING, TESTING) - I reviewed Nicole Baxter records, labs, notes, testing and imaging myself where available.  Lab Results  Component Value Date   WBC 5.9 07/11/2017   HGB 14.0 07/11/2017   HCT 40.6 07/11/2017   MCV 96 07/11/2017   PLT 170 07/11/2017      Component Value Date/Time   NA 142 12/08/2017 1110   NA 149 (H) 11/24/2017 1438   K 3.5 12/08/2017 1110   CL 106 12/08/2017 1110   CO2 26 12/08/2017 1110   GLUCOSE 88 12/08/2017 1110   BUN 10 12/08/2017 1110   BUN 12 11/24/2017 1438   CREATININE 0.78 12/08/2017 1110   CREATININE 0.73 03/02/2015 1037   CALCIUM 9.1 12/08/2017 1110   PROT 7.6 10/14/2015 1453   ALBUMIN 4.1 10/14/2015 1453   AST 13 10/14/2015 1453   ALT 5 10/14/2015 1453   ALKPHOS 117 10/14/2015 1453   BILITOT 0.6 10/14/2015 1453   GFRNONAA >60 12/08/2017 1110   GFRNONAA 83 03/02/2015 1037   GFRAA >60 12/08/2017 1110   GFRAA >89 03/02/2015 1037   Lab Results  Component Value Date   CHOL 129 05/09/2017   HDL 82 05/09/2017   LDLCALC 38 05/09/2017   TRIG 46 05/09/2017   CHOLHDL 1.6 05/09/2017   Lab Results  Component Value Date   HGBA1C 5.5 05/09/2017   Lab Results  Component Value Date   VITAMINB12 923 12/03/2015   Lab Results  Component Value Date   TSH 6.640 (H) 07/11/2017      ASSESSMENT AND PLAN 77 y.o. year old female  has a past medical history of Arthritis, GERD (gastroesophageal reflux disease), Hyperglycemia, Hyperlipidemia, Hypertension, Lung disease, occupational, Obesity, Parkinson's disease (tremor, stiffness, slow motion, unstable posture) (HCC), Shortness of breath dyspnea, and Tremor. here with:  1.  Parkinson's disease 2.  Abnormality of gait 3.   Hallucinations  The Nicole Baxter will continue on Sinemet 25-100 mg 2 tablets 3 times a day.  I will refer the Nicole Baxter to physical therapy for gait and balance therapy.  Nicole Baxter is having hallucinations.  We discussed potentially starting Seroquel.  I reviewed potential side effects and gave the family a printout reviewing this medication.  For now the Nicole Baxter will hold off on starting this.  If the hallucinations worsen they will let me know.  She will follow-up in 6 months or sooner if needed.   Butch Penny, MSN, NP-C 12/12/2017, 1:08 PM [facility] [address], [address] [place], [place] [address] [phone]

## 2017-12-12 NOTE — Patient Instructions (Signed)
Your Plan:  Continue Sinemet Consider seroquel for hallucinations if they get worse If your symptoms worsen or you develop new symptoms please let us know.   Thank you for coming to see [name] at [facility]. I hope we have been able to provide you high quality care today.  You may receive a patient satisfaction survey over the next few weeks. We would appreciate your feedback and comments so that we may continue to improve ourselves and the health of our patients.  Quetiapine tablets What is this medicine? QUETIAPINE (kwe TYE a peen) is an antipsychotic. It is used to treat schizophrenia and bipolar disorder, also known as manic-depression. This medicine may be used for other purposes; ask your health care provider or pharmacist if you have questions. COMMON BRAND NAME(S): Seroquel What should I tell my health care provider before I take this medicine? They need to know if you have any of these conditions: -brain tumor or head injury -breast cancer -cataracts -diabetes -difficulty swallowing -heart disease -kidney disease -liver disease -low blood counts, like low white cell, platelet, or red cell counts -low blood pressure or dizziness when standing up -Parkinson's disease -previous heart attack -seizures -suicidal thoughts, plans, or attempt by you or a family member -thyroid disease -an unusual or allergic reaction to quetiapine, other medicines, foods, dyes, or preservatives -pregnant or trying to get pregnant -breast-feeding How should I use this medicine? Take this medicine by mouth. Swallow it with a drink of water. Follow the directions on the prescription label. If it upsets your stomach you can take it with food. Take your medicine at regular intervals. Do not take it more often than directed. Do not stop taking except on the advice of your doctor or health care professional. A special MedGuide will be given to you by the pharmacist with each  prescription and refill. Be sure to read this information carefully each time. Talk to your pediatrician regarding the use of this medicine in children. While this drug may be prescribed for children as young as 10 years for selected conditions, precautions do apply. Patients over age 2 years may have a stronger reaction to this medicine and need smaller doses. Overdosage: If you think you have taken too much of this medicine contact a poison control center or emergency room at once. NOTE: This medicine is only for you. Do not share this medicine with others. What if I miss a dose? If you miss a dose, take it as soon as you can. If it is almost time for your next dose, take only that dose. Do not take double or extra doses. What may interact with this medicine? Do not take this medicine with any of the following medications: -certain medicines for fungal infections like fluconazole, itraconazole, ketoconazole, posaconazole, voriconazole -cisapride -dofetilide -dronedarone -droperidol -grepafloxacin -halofantrine -phenothiazines like chlorpromazine, mesoridazine, thioridazine -pimozide -sparfloxacin -ziprasidone This medicine may also interact with the following medications: -alcohol -antiviral medicines for HIV or AIDS -certain medicines for blood pressure -certain medicines for depression, anxiety, or psychotic disturbances like haloperidol, lorazepam -certain medicines for diabetes -certain medicines for Parkinson's disease -certain medicines for seizures like carbamazepine, phenobarbital, phenytoin -cimetidine -erythromycin -other medicines that prolong the QT interval (cause an abnormal heart rhythm) -rifampin -steroid medicines like prednisone or cortisone This list may not describe all possible interactions. Give your health care provider a list of all the medicines, herbs, non-prescription drugs, or dietary supplements you use. Also tell them if you smoke, drink alcohol, or  use  illegal drugs. Some items may interact with your medicine. What should I watch for while using this medicine? Visit your doctor or health care professional for regular checks on your progress. It may be several weeks before you see the full effects of this medicine. Your health care provider may suggest that you have your eyes examined prior to starting this medicine, and every 6 months thereafter. If you have been taking this medicine regularly for some time, do not suddenly stop taking it. You must gradually reduce the dose or your symptoms may get worse. Ask your doctor or health care professional for advice. Patients and their families should watch out for worsening depression or thoughts of suicide. Also watch out for sudden or severe changes in feelings such as feeling anxious, agitated, panicky, irritable, hostile, aggressive, impulsive, severely restless, overly excited and hyperactive, or not being able to sleep. If this happens, especially at the beginning of antidepressant treatment or after a change in dose, call your health care professional. Bonita QuinYou may get dizzy or drowsy. Do not drive, use machinery, or do anything that needs mental alertness until you know how this medicine affects you. Do not stand or sit up quickly, especially if you are an older patient. This reduces the risk of dizzy or fainting spells. Alcohol can increase dizziness and drowsiness. Avoid alcoholic drinks. Do not treat yourself for colds, diarrhea or allergies. Ask your doctor or health care professional for advice, some ingredients may increase possible side effects. This medicine can reduce the response of your body to heat or cold. Dress warm in cold weather and stay hydrated in hot weather. If possible, avoid extreme temperatures like saunas, hot tubs, very hot or cold showers, or activities that can cause dehydration such as vigorous exercise. What side effects may I notice from receiving this medicine? Side  effects that you should report to your doctor or health care professional as soon as possible: -allergic reactions like skin rash, itching or hives, swelling of the face, lips, or tongue -difficulty swallowing -fast or irregular heartbeat -fever or chills, sore throat -fever with rash, swollen lymph nodes, or swelling of the face -increased hunger or thirst -increased urination -problems with balance, talking, walking -seizures -stiff muscles -suicidal thoughts or other mood changes -uncontrollable head, mouth, neck, arm, or leg movements -unusually weak or tired Side effects that usually do not require medical attention (report to your doctor or health care professional if they continue or are bothersome): -change in sex drive or performance -constipation -drowsy or dizzy -dry mouth -stomach upset -weight gain This list may not describe all possible side effects. Call your doctor for medical advice about side effects. You may report side effects to FDA at 1-800-FDA-1088. Where should I keep my medicine? Keep out of the reach of children. Store at room temperature between 15 and 30 degrees C (59 and 86 degrees F). Throw away any unused medicine after the expiration date. NOTE: This sheet is a summary. It may not cover all possible information. If you have questions about this medicine, talk to your doctor, pharmacist, or health care provider.  2018 Elsevier/Gold Standard (2015-05-19 13:07:35)

## 2017-12-12 NOTE — Progress Notes (Signed)
Personally  participated in, made any corrections needed, and agree with history, physical, neuro exam,assessment and plan as stated above.    Grae Leathers, MD [facility] 

## 2017-12-27 ENCOUNTER — Ambulatory Visit (HOSPITAL_COMMUNITY): Payer: PPO | Attending: Adult Health

## 2017-12-27 ENCOUNTER — Encounter (HOSPITAL_COMMUNITY): Payer: Self-pay

## 2017-12-27 DIAGNOSIS — M6281 Muscle weakness (generalized): Secondary | ICD-10-CM

## 2017-12-27 DIAGNOSIS — R2681 Unsteadiness on feet: Secondary | ICD-10-CM

## 2017-12-27 DIAGNOSIS — Z9181 History of falling: Secondary | ICD-10-CM

## 2017-12-27 DIAGNOSIS — R293 Abnormal posture: Secondary | ICD-10-CM | POA: Diagnosis not present

## 2017-12-27 NOTE — Therapy (Signed)
[facility] [address] [place], [place], [address] Phone: [phone]   Fax:  [phone]  Physical Therapy Evaluation  Patient Details  Name: Nicole Baxter MRN: [id number] Date of Birth: 1941/06/08 Referring Provider: Butch Penny, NP   Encounter Date: 12/27/2017  PT End of Session - 12/27/17 1209    Visit Number  1    Number of Visits  17    Date for PT Re-Evaluation  01/24/18    Authorization Type  [employer]    Authorization Time Period  12/27/17 to 02/21/18    PT Start Time  1120    PT Stop Time  1205    PT Time Calculation (min)  45 min    Equipment Utilized During Treatment  Gait belt    Activity Tolerance  Patient tolerated treatment well    Behavior During Therapy   [facility] for tasks assessed/performed       Past Medical History:  Diagnosis Date  . Arthritis   . GERD (gastroesophageal reflux disease)    pt. states she no longer has GERD (09/11/15)  . Hyperglycemia    isolated FBS 122, 95, 104 (Random 138)  . Hyperlipidemia    on pravastatin  . Hypertension    controlled on 2 agents  . 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 Obesity   . Parkinson's disease (tremor, stiffness, slow motion, unstable posture) (HCC)   . Shortness of breath dyspnea    pt. states that she no longer has SOB )09/11/15  . Tremor     Past Surgical History:  Procedure Laterality Date  . CESAREAN SECTION    . COLONOSCOPY    . TOTAL KNEE ARTHROPLASTY Left 09/23/2015   Procedure: LEFT TOTAL KNEE ARTHROPLASTY;  Surgeon: Loreta Ave, MD;  Location: [facility] OR;  Service: Orthopedics;  Laterality: Left;    There were no vitals filed for this visit.   Subjective Assessment - 12/27/17 1127    Subjective  Pt's husband states that her feet act like they don't want to go. He states that she will stand up and she'll state "I can't move" and he will have to go help her get started. She reports that she gets stuck a lot. She has  had quite a few falls in the last 6 months. Her last fall was in December 2018; she states that she was just standing and fell forward busting her lip open. The majority of her falls occur when she is moving. She states that walking and balance are the most difficulty with. She has been using a SPC for a while    Limitations  Walking    How long can you walk comfortably?  depends    Currently in Pain?  Yes    Pain Score  4  based off VAS    Pain Location  Knee    Pain Orientation  Left    Pain Descriptors / Indicators  -- unable to describe    Pain Type  Chronic pain    Pain Onset  More than a month ago    Pain Frequency  Intermittent    Aggravating Factors   WB activities    Pain Relieving Factors  sitting, rest, rubbing it    Effect of Pain on Daily Activities  slight increase         [facility] PT Assessment - 12/27/17 0001      Assessment   Medical Diagnosis  Parkinson's Disease, gait and  balance    Referring Provider  Butch Penny, NP    Onset Date/Surgical Date  -- started worsening about 3 months ago    Next MD Visit  June 2019      Precautions   Precautions  Fall      Balance Screen   Has the patient fallen in the past 6 months  Yes    How many times?  multiple    Has the patient had a decrease in activity level because of a fear of falling?   Yes    Is the patient reluctant to leave their home because of a fear of falling?   No      Prior Function   Level of Independence  Independent with basic ADLs;Needs assistance with ADLs;Needs assistance with homemaking;Needs assistance with gait    Leisure  walking    Comments  reports performing "some of it" with regards to dressing, bathing, household chores, cooking, etc.      Observation/Other Assessments   Observations  no spasticity/tone noted in HS based on Ashworth scale      Posture/Postural Control   Posture/Postural Control  Postural limitations    Postural Limitations  Rounded Shoulders;Forward head;Increased  thoracic kyphosis      ROM / Strength   AROM / PROM / Strength  Strength      Strength   Strength Assessment Site  Hip;Knee;Ankle    Right Hip Flexion  4/5    Right Hip Extension  -- functionally weak based off transfers and gait    Right Hip ABduction  2+/5    Left Hip Flexion  4/5    Left Hip Extension  -- functionally weak based off transfers and gait    Left Hip ABduction  2+/5    Right Knee Flexion  4+/5    Right Knee Extension  5/5    Left Knee Flexion  4+/5    Left Knee Extension  4+/5    Right Ankle Dorsiflexion  4+/5    Left Ankle Dorsiflexion  4+/5      Flexibility   Soft Tissue Assessment /Muscle Length  yes    Hamstrings  WNL      Bed Mobility   Bed Mobility  Sit to Supine;Supine to Sit    Supine to Sit  4: Min assist    Supine to Sit Details (indicate cue type and reason)  cues for proper log rolling technique; assistance for LE negotiation    Sit to Supine  4: Min assist    Sit to Supine - Details (indicate cue type and reason)  cues for proper log rolling technique; assistance for LE negotiation      Ambulation/Gait   Gait Comments  festinating gait noted during ambulation in/out of clinic and during DGI; freezing of gait also noted intermittently in evaluation; difficulty making turns      Balance   Balance Assessed  No      Static Standing Balance   Static Standing - Balance Support  --    Static Standing Balance -  Activities   --    Static Standing - Comment/# of Minutes  --      Standardized Balance Assessment   Standardized Balance Assessment  Five Times Sit to Stand;Timed Up and Go Test;Dynamic Gait Index    Five times sit to stand comments   to be assessed next visit      Dynamic Gait Index   Level Surface  Mild Impairment    Change  in Gait Speed  Mild Impairment    Gait with Horizontal Head Turns  Moderate Impairment    Gait with Vertical Head Turns  Moderate Impairment    Gait and Pivot Turn  Moderate Impairment    Step Over Obstacle  Mild  Impairment    Step Around Obstacles  Moderate Impairment    Steps  Moderate Impairment    Total Score  11      Timed Up and Go Test   TUG Comments  to be assessed next visit          Objective measurements completed on examination: See above findings.        PT Education - 12/27/17 1208    Education provided  Yes    Education Details  exam findings, POC, HEP    Person(s) Educated  Patient    Methods  Explanation;Demonstration;Handout    Comprehension  Verbalized understanding;Returned demonstration;Need further instruction       PT Short Term Goals - 12/27/17 1225      PT SHORT TERM GOAL #1   Title  Pt will be independent with HEP and perform consistently in order to maximize strength to improve gait and balance.    Time  4    Period  Weeks    Status  New    Target Date  01/24/18      PT SHORT TERM GOAL #2   Title  Pt will be mod I with bed mobility in order to improve independence at home and decrease stress on caregiver.     Time  4    Period  Weeks    Status  New      PT SHORT TERM GOAL #3   Title  Pt will have 1/2 grade improvement in MMT in order to maximize gait and balance.    Time  4    Period  Weeks    Status  New      PT SHORT TERM GOAL #4   Title  Pt will score 14/24 or > on the DGI in order to demo improved dynamic gait and balance to decrease risk for falls.    Time  4    Period  Weeks    Status  New        PT Long Term Goals - 12/27/17 1257      PT LONG TERM GOAL #1   Title  Pt will have 1 grade improvement in MMT to maximize transfers, gait, and balance, and decrease risk for falls.    Time  8    Period  Weeks    Status  New    Target Date  02/21/18      PT LONG TERM GOAL #2   Title  Pt will perform 5xSTS in 15 sec or < with no UE to demo improved functional strength and decrease risk for falls.    Time  8    Period  Weeks    Status  New      PT LONG TERM GOAL #3   Title  Pt will be able to perform TUG in 15 sec or < with LRAD  to demo improved balance and gait to decrease risk for falls and maximize pt's community access.    Time  8    Period  Weeks    Status  New      PT LONG TERM GOAL #4   Title  Pt will score 17/24 or > on DGI to demo improved  overall gait and decrease risk for falls.    Time  8    Period  Weeks    Status  New      PT LONG TERM GOAL #5   Title  Pt will be able to ambulate 45750ft with LRAD and will have no more than 2 freezing episodes and festinating gait <50% of the time to demo improved overall gait in order to decrease her risk for falls.    Time  8    Period  Weeks    Status  New             Plan - 12/27/17 1220    Clinical Impression Statement  Pt is pleasant 76YO F who is referred to OPPT for Parkinson's disease, gait, and balance. Pt demonstrates festinating gait with intermittent freezing episodes and difficulty making turns. Pt's gait and other functional movements are generally slowed and require increased time to complete task. She also has significant proximal hip weakness, deficits in functional strength, posture, with impairments in gait and balance. Pt scored 11/24 on the DGI indicating pt is at risk for falls. Pt needs skilled PT intervention to address these impairments in order to improve her balance, gait, and strength in order to maximize her overall safety and independence with functional tasks.    History and Personal Factors relevant to plan of care:  Parkinson's Disease; supportive husband; HTN, arthritis, L knee pain, SOB dyspnea, hyperlipidemia    Clinical Presentation  Stable    Clinical Presentation due to:  MMT, gait, balance, functional strength, DGI, difficulty with functional tasks, posture    Clinical Decision Making  Low    Rehab Potential  Fair    PT Frequency  2x / week    PT Duration  8 weeks    PT Treatment/Interventions  ADLs/Self Care Home Management;Cryotherapy;Electrical Stimulation;Moist Heat;DME Instruction;Gait training;Stair  training;Functional mobility training;Therapeutic activities;Therapeutic exercise;Balance training;Neuromuscular re-education;Patient/family education;Manual techniques;Passive range of motion;Dry needling;Energy conservation;Taping    PT Next Visit Plan  review goals and HEP; perform TUG, 5xSTS; begin strengthening, gait training, balance training, postural strengthenign    PT Home Exercise Plan  eval: bridging, STS    Recommended Other Services  potentially ST and/or OT evaluation for hypopohonia and UE functional use deficits    Consulted and Agree with Plan of Care  Patient;Family member/caregiver    Family Member Consulted  husband       Patient will benefit from skilled therapeutic intervention in order to improve the following deficits and impairments:  Abnormal gait, Decreased activity tolerance, Decreased balance, Decreased endurance, Decreased knowledge of use of DME, Decreased mobility, Decreased strength, Difficulty walking, Impaired flexibility, Improper body mechanics, Postural dysfunction, Pain  Visit Diagnosis: Unsteadiness on feet - Plan: PT plan of care cert/re-cert  History of falling - Plan: PT plan of care cert/re-cert  Muscle weakness (generalized) - Plan: PT plan of care cert/re-cert  Abnormal posture - Plan: PT plan of care cert/re-cert     Problem List Patient Active Problem List   Diagnosis Date Noted  . Mild intermittent asthma, uncomplicated 10/31/2017  . Weight loss, unintentional 07/11/2017  . Dizziness 06/16/2017  . Chronic bilateral low back pain without sciatica 12/19/2016  . 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 Hypertensive retinopathy of both eyes, grade 2 01/15/2016  . Left leg DVT (HCC) 12/18/2015  . 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 Internal hemorrhoids 03/10/2015  . History of colon polyps 12/25/2014  . Health care maintenance 02/13/2014  .  Subclinical hypothyroidism 12/25/2013  . Right renal  mass 06/11/2012  . Osteoarthritis of both knees 03/15/2012  . Parkinsonism (HCC) 07/13/2010  . ALLERGIC RHINITIS, SEASONAL 02/20/2007  . Hyperlipidemia 09/27/2006  . Essential hypertension 09/27/2006  . GERD 09/27/2006       Jac Canavan PT, DPT  [facility] [address] [place], [place], [address] Phone: [phone]   Fax:  [phone]  Name: Nicole Baxter MRN: [id number] Date of Birth: 21-Jul-1941

## 2017-12-27 NOTE — Patient Instructions (Signed)
  BRIDGING  While lying on your back with knees bent, tighten your lower abdominals, squeeze your buttocks and then raise your buttocks off the floor/bed as creating a "Bridge" with your body. Hold and then lower yourself and repeat.  Perform 1x/day, 2-3 sets of 10 reps   SIT TO STAND - THIGH SUPPORT  Start by scooting close to the front of the chair.  Then lean forward and place your hands on your thighs. Rise up to standing using your hands for support.   Sit back down using your hands for support on your thighs and then repeat.   Perform 1x/day, 2-3 sets of 10 reps

## 2017-12-28 ENCOUNTER — Other Ambulatory Visit: Payer: Self-pay

## 2017-12-28 ENCOUNTER — Encounter (HOSPITAL_COMMUNITY): Payer: Self-pay | Admitting: Physical Therapy

## 2017-12-28 ENCOUNTER — Ambulatory Visit (HOSPITAL_COMMUNITY): Payer: PPO | Admitting: Physical Therapy

## 2017-12-28 DIAGNOSIS — R2681 Unsteadiness on feet: Secondary | ICD-10-CM | POA: Diagnosis not present

## 2017-12-28 DIAGNOSIS — Z9181 History of falling: Secondary | ICD-10-CM

## 2017-12-28 DIAGNOSIS — R293 Abnormal posture: Secondary | ICD-10-CM

## 2017-12-28 DIAGNOSIS — M6281 Muscle weakness (generalized): Secondary | ICD-10-CM

## 2017-12-28 NOTE — Therapy (Signed)
[facility] [address] [place], [place], [address] Phone: [phone]   Fax:  [phone]  Physical Therapy Treatment  Patient Details  Name: Nicole Baxter MRN: [id number] Date of Birth: 1941-07-24 Referring Provider: Butch Penny, NP   Encounter Date: 12/28/2017  PT End of Session - 12/28/17 1022    Visit Number  2    Number of Visits  17    Date for PT Re-Evaluation  01/24/18    Authorization Type  [employer]    Authorization Time Period  12/27/17 to 02/21/18    PT Start Time  0945    PT Stop Time  1024    PT Time Calculation (min)  39 min    Equipment Utilized During Treatment  Gait belt    Activity Tolerance  Patient tolerated treatment well    Behavior During Therapy  [facility] for tasks assessed/performed       Past Medical History:  Diagnosis Date  . Arthritis   . GERD (gastroesophageal reflux disease)    pt. states she no longer has GERD (09/11/15)  . Hyperglycemia    isolated FBS 122, 95, 104 (Random 138)  . Hyperlipidemia    on pravastatin  . Hypertension    controlled on 2 agents  . 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 Obesity   . Parkinson's disease (tremor, stiffness, slow motion, unstable posture) (HCC)   . Shortness of breath dyspnea    pt. states that she no longer has SOB )09/11/15  . Tremor     Past Surgical History:  Procedure Laterality Date  . CESAREAN SECTION    . COLONOSCOPY    . TOTAL KNEE ARTHROPLASTY Left 09/23/2015   Procedure: LEFT TOTAL KNEE ARTHROPLASTY;  Surgeon: Loreta Ave, MD;  Location: [facility] OR;  Service: Orthopedics;  Laterality: Left;    There were no vitals filed for this visit.  Subjective Assessment - 12/28/17 0959    Subjective  Patient stated that she has had some soreness in her left knee and that she has not had a chance to do the HEP yet.     Patient is accompained by:  Family member Patient's husband    Limitations  Walking    How long can you  walk comfortably?  depends    Pain Score  4     Pain Location  Knee    Pain Orientation  Right    Pain Descriptors / Indicators  Sore    Pain Type  Chronic pain    Pain Onset  More than a month ago    Pain Frequency  Intermittent    Aggravating Factors   Using it a lot bending and straightening it    Pain Relieving Factors  No special thing seems to help, less activity    Multiple Pain Sites  No         [facility] PT Assessment - 12/28/17 0001      Standardized Balance Assessment   Five times sit to stand comments   35 seconds use of upper extremities on 2 trials      Timed Up and Go Test   TUG  Normal TUG    Normal TUG (seconds)  41    TUG Comments  Performed with upper extremity assist with cane to stand up                  [facility] Adult PT Treatment/Exercise - 12/28/17 0001  Ambulation/Gait   Ambulation/Gait  Yes    Ambulation/Gait Assistance  4: Min guard    Ambulation Distance (Feet)  240 Feet    Assistive device  Straight cane    Pre-Gait Activities  Nustep for 5 minutes with therapeutic rest breaks as needed in order to promote reciprocal upper extremity and lower extremity movment. Therapist providing verbal cues throughout.     Gait Comments  Festinating gait with decreased arm swing, decreased stride length, and freezing noted throughout gait. Patient was cued using a metronome set at 75 bpm to perform ambulation. Verbal cues to take larger steps and move cane with opposite lower extremity.       Knee/Hip Exercises: Seated   Sit to Sand  10 reps;Other (comment) Intermittent use of upper extremity support. Standard chair      Knee/Hip Exercises: Supine   Bridges  Strengthening;Both;1 set;10 reps             PT Education - 12/28/17 1021    Education provided  Yes    Education Details  Reviewed HEP and evaluation as well, educated on gait training and interventions throughout.     Person(s) Educated  Patient;Spouse    Methods   Explanation;Handout;Demonstration    Comprehension  Need further instruction;Verbalized understanding       PT Short Term Goals - 12/27/17 1225      PT SHORT TERM GOAL #1   Title  Pt will be independent with HEP and perform consistently in order to maximize strength to improve gait and balance.    Time  4    Period  Weeks    Status  New    Target Date  01/24/18      PT SHORT TERM GOAL #2   Title  Pt will be mod I with bed mobility in order to improve independence at home and decrease stress on caregiver.     Time  4    Period  Weeks    Status  New      PT SHORT TERM GOAL #3   Title  Pt will have 1/2 grade improvement in MMT in order to maximize gait and balance.    Time  4    Period  Weeks    Status  New      PT SHORT TERM GOAL #4   Title  Pt will score 14/24 or > on the DGI in order to demo improved dynamic gait and balance to decrease risk for falls.    Time  4    Period  Weeks    Status  New        PT Long Term Goals - 12/27/17 1257      PT LONG TERM GOAL #1   Title  Pt will have 1 grade improvement in MMT to maximize transfers, gait, and balance, and decrease risk for falls.    Time  8    Period  Weeks    Status  New    Target Date  02/21/18      PT LONG TERM GOAL #2   Title  Pt will perform 5xSTS in 15 sec or < with no UE to demo improved functional strength and decrease risk for falls.    Time  8    Period  Weeks    Status  New      PT LONG TERM GOAL #3   Title  Pt will be able to perform TUG in 15 sec or < with LRAD to  demo improved balance and gait to decrease risk for falls and maximize pt's community access.    Time  8    Period  Weeks    Status  New      PT LONG TERM GOAL #4   Title  Pt will score 17/24 or > on DGI to demo improved overall gait and decrease risk for falls.    Time  8    Period  Weeks    Status  New      PT LONG TERM GOAL #5   Title  Pt will be able to ambulate 42350ft with LRAD and will have no more than 2 freezing episodes and  festinating gait <50% of the time to demo improved overall gait in order to decrease her risk for falls.    Time  8    Period  Weeks    Status  New            Plan - 12/28/17 1228    Clinical Impression Statement  This session began by reviewing the patient's evaluation and home exercise program. This session incorporated objective testing as described which was subtracted from the overall billed time. Performed 5x STS which patient performed in 35 seconds and TUG test this session which patient performed in 41 seconds using cane to help stand up. Then progressed patient with gait training this session. Patient performed ambulation for 240 feet with therapist using a metronome set at 75 bpm in order to cue patient to perform ambulation at that speed. Patient demonstrated 3 episodes of freezing throughout ambulation. Patient was also instructed to move her cane at the same time as her opposite leg and to try and take as big of steps as possible. Used the NuStep this session for improving patient's gait as it promoted reciprocal upper extremity and lower extremity movement. Patient performed for 5 minutes with frequent therapeutic rest breaks throughout. Patient required increased time on all activities this session. Patient would benefit from continued skilled physical therapy in order to progress patient in her balance, gait, strength, and overall functional mobility.     Rehab Potential  Fair    PT Frequency  2x / week    PT Duration  8 weeks    PT Treatment/Interventions  ADLs/Self Care Home Management;Cryotherapy;Electrical Stimulation;Moist Heat;DME Instruction;Gait training;Stair training;Functional mobility training;Therapeutic activities;Therapeutic exercise;Balance training;Neuromuscular re-education;Patient/family education;Manual techniques;Passive range of motion;Dry needling;Energy conservation;Taping    PT Next Visit Plan  Continue strengthening, gait training metronome for cueing,  balance training consider perterbations for stepping strategy, and begin postural strengthening    PT Home Exercise Plan  eval: bridging, STS    Consulted and Agree with Plan of Care  Patient;Family member/caregiver    Family Member Consulted  husband       Patient will benefit from skilled therapeutic intervention in order to improve the following deficits and impairments:  Abnormal gait, Decreased activity tolerance, Decreased balance, Decreased endurance, Decreased knowledge of use of DME, Decreased mobility, Decreased strength, Difficulty walking, Impaired flexibility, Improper body mechanics, Postural dysfunction, Pain  Visit Diagnosis: Unsteadiness on feet  History of falling  Muscle weakness (generalized)  Abnormal posture     Problem List Patient Active Problem List   Diagnosis Date Noted  . Mild intermittent asthma, uncomplicated 10/31/2017  . Weight loss, unintentional 07/11/2017  . Dizziness 06/16/2017  . Chronic bilateral low back pain without sciatica 12/19/2016  . 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 Hypertensive retinopathy of both eyes, grade 2 01/15/2016  .  Left leg DVT (HCC) 12/18/2015  . 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 Internal hemorrhoids 03/10/2015  . History of colon polyps 12/25/2014  . Health care maintenance 02/13/2014  . Subclinical hypothyroidism 12/25/2013  . Right renal mass 06/11/2012  . Osteoarthritis of both knees 03/15/2012  . Parkinsonism (HCC) 07/13/2010  . ALLERGIC RHINITIS, SEASONAL 02/20/2007  . Hyperlipidemia 09/27/2006  . Essential hypertension 09/27/2006  . GERD 09/27/2006   Verne Carrow PT, DPT 12:43 PM, 12/28/17 [phone]  [facility] [address] [place], [place], [address] Phone: [phone]   Fax:  [phone]  Name: SHADARA LOPEZ MRN: [id number] Date of Birth: 05-Feb-1941

## 2018-01-02 ENCOUNTER — Ambulatory Visit (HOSPITAL_COMMUNITY): Payer: PPO | Attending: Adult Health

## 2018-01-02 ENCOUNTER — Encounter (HOSPITAL_COMMUNITY): Payer: Self-pay

## 2018-01-02 DIAGNOSIS — R293 Abnormal posture: Secondary | ICD-10-CM

## 2018-01-02 DIAGNOSIS — M6281 Muscle weakness (generalized): Secondary | ICD-10-CM | POA: Diagnosis not present

## 2018-01-02 DIAGNOSIS — R2681 Unsteadiness on feet: Secondary | ICD-10-CM

## 2018-01-02 DIAGNOSIS — Z9181 History of falling: Secondary | ICD-10-CM

## 2018-01-02 NOTE — Therapy (Signed)
[facility] [address] [place], [place], [address] Phone: [phone]   Fax:  [phone]  Physical Therapy Treatment  Patient Details  Name: Nicole Baxter MRN: [id number] Date of Birth: 05-05-1941 Referring Provider: Butch Penny, NP   Encounter Date: 01/02/2018  PT End of Session - 01/02/18 0823    Visit Number  3    Number of Visits  17    Date for PT Re-Evaluation  01/24/18    Authorization Type  [employer]    Authorization Time Period  12/27/17 to 02/21/18    PT Start Time  0814 5 min on Nustep for UE/LE sequence, no charge    PT Stop Time  0904    PT Time Calculation (min)  50 min    Equipment Utilized During Treatment  Gait belt    Activity Tolerance  Patient tolerated treatment well    Behavior During Therapy  [facility] for tasks assessed/performed       Past Medical History:  Diagnosis Date  . Arthritis   . GERD (gastroesophageal reflux disease)    pt. states she no longer has GERD (09/11/15)  . Hyperglycemia    isolated FBS 122, 95, 104 (Random 138)  . Hyperlipidemia    on pravastatin  . Hypertension    controlled on 2 agents  . 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 Obesity   . Parkinson's disease (tremor, stiffness, slow motion, unstable posture) (HCC)   . Shortness of breath dyspnea    pt. states that she no longer has SOB )09/11/15  . Tremor     Past Surgical History:  Procedure Laterality Date  . CESAREAN SECTION    . COLONOSCOPY    . TOTAL KNEE ARTHROPLASTY Left 09/23/2015   Procedure: LEFT TOTAL KNEE ARTHROPLASTY;  Surgeon: Loreta Ave, MD;  Location: [facility] OR;  Service: Orthopedics;  Laterality: Left;    There were no vitals filed for this visit.  Subjective Assessment - 01/02/18 0810    Subjective  Pt stated she has some soreness in Rt knee, pain scale 2/10.  Reports she has not began HEP yet, stated she stays busy.  Reports a fall over weekend in restroom, husband required  assistance to stand up, no reports of pain following fall.      Currently in Pain?  Yes    Pain Score  2     Pain Location  Knee    Pain Orientation  Right    Pain Descriptors / Indicators  Sore    Pain Type  Chronic pain    Pain Onset  More than a month ago    Pain Frequency  Intermittent    Aggravating Factors   Using it a lot bending and straightening it    Pain Relieving Factors  no special thing seems to help, less activity    Effect of Pain on Daily Activities  slight increases                      [facility] Adult PT Treatment/Exercise - 01/02/18 0001      Ambulation/Gait   Ambulation/Gait  Yes    Ambulation/Gait Assistance  4: Min guard    Ambulation Distance (Feet)  226 Feet    Assistive device  Straight cane    Pre-Gait Activities  Nustepx 4 minutes with therapist facilitation to improve sequence with UE/LE movements    Gait Comments  Festinating gait with decreased arm swing,  decreased stride length, and freezing noted throughout gait. Patient was cued using a metronome set at 75 bpm to perform ambulation. Verbal cues to take larger steps and move cane with opposite lower extremity.       Posture/Postural Control   Posture/Postural Control  Postural limitations    Postural Limitations  Rounded Shoulders;Forward head;Increased thoracic kyphosis      Knee/Hip Exercises: Seated   Marching  2 sets;10 reps alternating LE; included UE wiht opp LE  2nd set    Sit to Sand  10 reps;Other (comment) reaching for ball then UE flexion upon standing        [facility]) - 01/02/18 0842    PWR! Up  5x each    PWR Step  5x BLE       Balance Exercises - 01/02/18 0911      Balance Exercises: Standing   Sidestepping  4 reps front of mat    Step Over Hurdles / Cones  sidestep over 6in hurdles 5x infront of mat          PT Short Term Goals - 12/27/17 1225      PT SHORT TERM GOAL #1   Title  Pt will be independent with HEP and perform consistently in order to  maximize strength to improve gait and balance.    Time  4    Period  Weeks    Status  New    Target Date  01/24/18      PT SHORT TERM GOAL #2   Title  Pt will be mod I with bed mobility in order to improve independence at home and decrease stress on caregiver.     Time  4    Period  Weeks    Status  New      PT SHORT TERM GOAL #3   Title  Pt will have 1/2 grade improvement in MMT in order to maximize gait and balance.    Time  4    Period  Weeks    Status  New      PT SHORT TERM GOAL #4   Title  Pt will score 14/24 or > on the DGI in order to demo improved dynamic gait and balance to decrease risk for falls.    Time  4    Period  Weeks    Status  New        PT Long Term Goals - 12/27/17 1257      PT LONG TERM GOAL #1   Title  Pt will have 1 grade improvement in MMT to maximize transfers, gait, and balance, and decrease risk for falls.    Time  8    Period  Weeks    Status  New    Target Date  02/21/18      PT LONG TERM GOAL #2   Title  Pt will perform 5xSTS in 15 sec or < with no UE to demo improved functional strength and decrease risk for falls.    Time  8    Period  Weeks    Status  New      PT LONG TERM GOAL #3   Title  Pt will be able to perform TUG in 15 sec or < with LRAD to demo improved balance and gait to decrease risk for falls and maximize pt's community access.    Time  8    Period  Weeks    Status  New      PT  LONG TERM GOAL #4   Title  Pt will score 17/24 or > on DGI to demo improved overall gait and decrease risk for falls.    Time  8    Period  Weeks    Status  New      PT LONG TERM GOAL #5   Title  Pt will be able to ambulate 46950ft with LRAD and will have no more than 2 freezing episodes and festinating gait <50% of the time to demo improved overall gait in order to decrease her risk for falls.    Time  8    Period  Weeks    Status  New            Plan - 01/02/18 0914    Clinical Impression Statement  Session focus on improving  sequencing with UE/LE to improve balance and gait mechanics.  Incorporated PWR! technqiues to improve reaching outside BOS, obstacles for SLS and balance training and cueing to improve gait mechanics.  Pt able to demonstrate appropriate UE swing wiht LE, reduced swing and moderate cueing to increased stride length.      Rehab Potential  Fair    PT Frequency  2x / week    PT Duration  8 weeks    PT Treatment/Interventions  ADLs/Self Care Home Management;Cryotherapy;Electrical Stimulation;Moist Heat;DME Instruction;Gait training;Stair training;Functional mobility training;Therapeutic activities;Therapeutic exercise;Balance training;Neuromuscular re-education;Patient/family education;Manual techniques;Passive range of motion;Dry needling;Energy conservation;Taping    PT Next Visit Plan  Continue strengthening, gait training metronome for cueing, balance training consider perterbations for stepping strategy, and begin postural strengthening    PT Home Exercise Plan  eval: bridging, STS       Patient will benefit from skilled therapeutic intervention in order to improve the following deficits and impairments:  Abnormal gait, Decreased activity tolerance, Decreased balance, Decreased endurance, Decreased knowledge of use of DME, Decreased mobility, Decreased strength, Difficulty walking, Impaired flexibility, Improper body mechanics, Postural dysfunction, Pain  Visit Diagnosis: Unsteadiness on feet  History of falling  Muscle weakness (generalized)  Abnormal posture     Problem List Patient Active Problem List   Diagnosis Date Noted  . Mild intermittent asthma, uncomplicated 10/31/2017  . Weight loss, unintentional 07/11/2017  . Dizziness 06/16/2017  . Chronic bilateral low back pain without sciatica 12/19/2016  . 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 Hypertensive retinopathy of both eyes, grade 2 01/15/2016  . Left leg DVT (HCC) 12/18/2015  . AMD (age-related macular  degeneration), bilateral 11/11/2015  . Internal hemorrhoids 03/10/2015  . History of colon polyps 12/25/2014  . Health care maintenance 02/13/2014  . Subclinical hypothyroidism 12/25/2013  . Right renal mass 06/11/2012  . Osteoarthritis of both knees 03/15/2012  . Parkinsonism (HCC) 07/13/2010  . ALLERGIC RHINITIS, SEASONAL 02/20/2007  . Hyperlipidemia 09/27/2006  . Essential hypertension 09/27/2006  . GERD 09/27/2006   Becky Saxasey Isami Mehra, LPTA; CBIS [phone]  Juel BurrowCockerham, Everitt Wenner Jo 01/02/2018, 9:20 AM  [facility] [address] [place], [place], [address] Phone: [phone]   Fax:  [phone]  Name: Randa NgoRuby L Violante MRN: [id number] Date of Birth: 03/19/1941

## 2018-01-04 ENCOUNTER — Encounter (HOSPITAL_COMMUNITY): Payer: Self-pay

## 2018-01-04 ENCOUNTER — Ambulatory Visit (HOSPITAL_COMMUNITY): Payer: PPO

## 2018-01-04 DIAGNOSIS — R2681 Unsteadiness on feet: Secondary | ICD-10-CM

## 2018-01-04 DIAGNOSIS — Z9181 History of falling: Secondary | ICD-10-CM

## 2018-01-04 DIAGNOSIS — R293 Abnormal posture: Secondary | ICD-10-CM

## 2018-01-04 DIAGNOSIS — M6281 Muscle weakness (generalized): Secondary | ICD-10-CM

## 2018-01-04 NOTE — Therapy (Signed)
[facility] [address] [place], [place], [address] Phone: [phone]   Fax:  [phone]  Physical Therapy Treatment  Patient Details  Name: Nicole Baxter MRN: [id number] Date of Birth: Oct 24, 1941 Referring Provider: Butch Penny, NP   Encounter Date: 01/04/2018  PT End of Session - 01/04/18 1133    Visit Number  4    Number of Visits  17    Date for PT Re-Evaluation  01/24/18    Authorization Type  [employer]    Authorization Time Period  12/27/17 to 02/21/18    PT Start Time  1123    PT Stop Time  1205    PT Time Calculation (min)  42 min    Equipment Utilized During Treatment  Gait belt    Activity Tolerance  Patient tolerated treatment well    Behavior During Therapy  [facility] for tasks assessed/performed       Past Medical History:  Diagnosis Date  . Arthritis   . GERD (gastroesophageal reflux disease)    pt. states she no longer has GERD (09/11/15)  . Hyperglycemia    isolated FBS 122, 95, 104 (Random 138)  . Hyperlipidemia    on pravastatin  . Hypertension    controlled on 2 agents  . 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 Obesity   . Parkinson's disease (tremor, stiffness, slow motion, unstable posture) (HCC)   . Shortness of breath dyspnea    pt. states that she no longer has SOB )09/11/15  . Tremor     Past Surgical History:  Procedure Laterality Date  . CESAREAN SECTION    . COLONOSCOPY    . TOTAL KNEE ARTHROPLASTY Left 09/23/2015   Procedure: LEFT TOTAL KNEE ARTHROPLASTY;  Surgeon: Loreta Ave, MD;  Location: [facility] OR;  Service: Orthopedics;  Laterality: Left;    There were no vitals filed for this visit.  Subjective Assessment - 01/04/18 1131    Subjective  Pt stated she is having a rough day with parkinson's today.  No reports of pain or recent falls.  Husband stated he is trying to give cueing with gait.      Currently in Pain?  No/denies                       [facility] Adult PT Treatment/Exercise - 01/04/18 0001      Ambulation/Gait   Ambulation/Gait  Yes    Ambulation/Gait Assistance  4: Min guard    Ambulation Distance (Feet)  226 Feet 2 sets    Assistive device  Straight cane    Pre-Gait Activities  Nustep x 5 minutes with therapist facilitation to improve UE/LE sequence    Gait Comments  Festinating gait with decreased arm swing, decreased stride length, and freezing noted throughout gait. Patient was cued using a metronome set at 75 bpm to perform ambulation. Verbal cues to take larger steps and move cane with opposite lower extremity.         PWR [facility]) - 01/04/18 1208    PWR! Up  5x each    PWR Step  5x BLE       Balance Exercises - 01/04/18 1210      Balance Exercises: Standing   Sidestepping  4 reps infront of mat    Step Over Hurdles / Cones  sidestep over 6in hurdles 5x infront of mat          PT Short Term Goals -  12/27/17 1225      PT SHORT TERM GOAL #1   Title  Pt will be independent with HEP and perform consistently in order to maximize strength to improve gait and balance.    Time  4    Period  Weeks    Status  New    Target Date  01/24/18      PT SHORT TERM GOAL #2   Title  Pt will be mod I with bed mobility in order to improve independence at home and decrease stress on caregiver.     Time  4    Period  Weeks    Status  New      PT SHORT TERM GOAL #3   Title  Pt will have 1/2 grade improvement in MMT in order to maximize gait and balance.    Time  4    Period  Weeks    Status  New      PT SHORT TERM GOAL #4   Title  Pt will score 14/24 or > on the DGI in order to demo improved dynamic gait and balance to decrease risk for falls.    Time  4    Period  Weeks    Status  New        PT Long Term Goals - 12/27/17 1257      PT LONG TERM GOAL #1   Title  Pt will have 1 grade improvement in MMT to maximize transfers, gait, and balance, and decrease risk for falls.     Time  8    Period  Weeks    Status  New    Target Date  02/21/18      PT LONG TERM GOAL #2   Title  Pt will perform 5xSTS in 15 sec or < with no UE to demo improved functional strength and decrease risk for falls.    Time  8    Period  Weeks    Status  New      PT LONG TERM GOAL #3   Title  Pt will be able to perform TUG in 15 sec or < with LRAD to demo improved balance and gait to decrease risk for falls and maximize pt's community access.    Time  8    Period  Weeks    Status  New      PT LONG TERM GOAL #4   Title  Pt will score 17/24 or > on DGI to demo improved overall gait and decrease risk for falls.    Time  8    Period  Weeks    Status  New      PT LONG TERM GOAL #5   Title  Pt will be able to ambulate 47050ft with LRAD and will have no more than 2 freezing episodes and festinating gait <50% of the time to demo improved overall gait in order to decrease her risk for falls.    Time  8    Period  Weeks    Status  New            Plan - 01/04/18 1205    Clinical Impression Statement  Pt with increased difficulty today.  Session focus on improving sequence wiht UE/LE and gait training to improve mechanics.  Increased cueing required with gait to increase stride length and reduce shuffling gait.  Pt c/o tenderness Rt knee through sessiom, no pain scale given.      Rehab Potential  Fair  PT Frequency  2x / week    PT Duration  8 weeks    PT Treatment/Interventions  ADLs/Self Care Home Management;Cryotherapy;Electrical Stimulation;Moist Heat;DME Instruction;Gait training;Stair training;Functional mobility training;Therapeutic activities;Therapeutic exercise;Balance training;Neuromuscular re-education;Patient/family education;Manual techniques;Passive range of motion;Dry needling;Energy conservation;Taping    PT Next Visit Plan  Continue strengthening, gait training metronome for cueing, balance training consider perterbations for stepping strategy, and begin postural  strengthening    PT Home Exercise Plan  eval: bridging, STS       Patient will benefit from skilled therapeutic intervention in order to improve the following deficits and impairments:  Abnormal gait, Decreased activity tolerance, Decreased balance, Decreased endurance, Decreased knowledge of use of DME, Decreased mobility, Decreased strength, Difficulty walking, Impaired flexibility, Improper body mechanics, Postural dysfunction, Pain  Visit Diagnosis: Unsteadiness on feet  History of falling  Muscle weakness (generalized)  Abnormal posture     Problem List Patient Active Problem List   Diagnosis Date Noted  . Mild intermittent asthma, uncomplicated 10/31/2017  . Weight loss, unintentional 07/11/2017  . Dizziness 06/16/2017  . Chronic bilateral low back pain without sciatica 12/19/2016  . 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 Hypertensive retinopathy of both eyes, grade 2 01/15/2016  . Left leg DVT (HCC) 12/18/2015  . 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 Internal hemorrhoids 03/10/2015  . History of colon polyps 12/25/2014  . Health care maintenance 02/13/2014  . Subclinical hypothyroidism 12/25/2013  . Right renal mass 06/11/2012  . Osteoarthritis of both knees 03/15/2012  . Parkinsonism (HCC) 07/13/2010  . ALLERGIC RHINITIS, SEASONAL 02/20/2007  . Hyperlipidemia 09/27/2006  . Essential hypertension 09/27/2006  . GERD 09/27/2006   Becky Sax, LPTA; CBIS [phone]  Juel Burrow 01/04/2018, 12:15 PM  [facility] [address] [place], [place], [address] Phone: [phone]   Fax:  [phone]  Name: Nicole Baxter MRN: [id number] Date of Birth: 15-May-1941

## 2018-01-08 ENCOUNTER — Ambulatory Visit (HOSPITAL_COMMUNITY): Payer: PPO

## 2018-01-08 ENCOUNTER — Encounter (HOSPITAL_COMMUNITY): Payer: Self-pay

## 2018-01-08 DIAGNOSIS — Z9181 History of falling: Secondary | ICD-10-CM

## 2018-01-08 DIAGNOSIS — R2681 Unsteadiness on feet: Secondary | ICD-10-CM

## 2018-01-08 DIAGNOSIS — M6281 Muscle weakness (generalized): Secondary | ICD-10-CM

## 2018-01-08 DIAGNOSIS — R293 Abnormal posture: Secondary | ICD-10-CM

## 2018-01-08 NOTE — Therapy (Signed)
[facility] [address] [place], [place], [address] Phone: [phone]   Fax:  [phone]  Physical Therapy Treatment  Patient Details  Name: Nicole NgoRuby L Birnie MRN: [id number] Date of Birth: 09/29/1941 Referring Provider: Butch PennyMegan Millikan, NP   Encounter Date: 01/08/2018  PT End of Session - 01/08/18 0944    Visit Number  5    Number of Visits  17    Date for PT Re-Evaluation  01/24/18    Authorization Type  [employer]    Authorization Time Period  12/27/17 to 02/21/18    PT Start Time  0945    PT Stop Time  1025    PT Time Calculation (min)  40 min    Equipment Utilized During Treatment  Gait belt    Activity Tolerance  Patient tolerated treatment well    Behavior During Therapy  [facility] for tasks assessed/performed       Past Medical History:  Diagnosis Date  . Arthritis   . GERD (gastroesophageal reflux disease)    pt. states she no longer has GERD (09/11/15)  . Hyperglycemia    isolated FBS 122, 95, 104 (Random 138)  . Hyperlipidemia    on pravastatin  . Hypertension    controlled on 2 agents  . 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 Obesity   . Parkinson's disease (tremor, stiffness, slow motion, unstable posture) (HCC)   . Shortness of breath dyspnea    pt. states that she no longer has SOB )09/11/15  . Tremor     Past Surgical History:  Procedure Laterality Date  . CESAREAN SECTION    . COLONOSCOPY    . TOTAL KNEE ARTHROPLASTY Left 09/23/2015   Procedure: LEFT TOTAL KNEE ARTHROPLASTY;  Surgeon: Loreta Aveaniel F Murphy, MD;  [facility];  Service: Orthopedics;  Laterality: Left;    There were no vitals filed for this visit.  Subjective Assessment - 01/08/18 0944    Subjective  Pt states she's stiff today. Her husband states they had a good weekend.    Currently in Pain?  No/denies            [facility] - 01/08/18 0001      Knee/Hip Exercises: Aerobic   Nustep  x5  mins L2, seat 7, maintaining SPMs >30, to improve gait sequencing and speed      Knee/Hip Exercises: Standing   Gait Training  x13900ft after nustep focusing on sequencing, speed, and arm speed    Other Standing Knee Exercises  perturbations in all directions in // bars x5 mins total     Other Standing Knee Exercises  fwd gait thru agility ladder 7015ft x5RT      Knee/Hip Exercises: Seated   Other Seated Knee/Hip Exercises  scap retractions with RTB 2x10reps       PWR ([facility]) - 01/08/18 1000    PWR! Up  x5 reps    PWR! Rock  x5 reps            PT Education - 01/08/18 [phone]    Education provided  Yes    Education Details  exercise technique    Person(s) Educated  Patient;Spouse    Methods  Explanation;Demonstration    Comprehension  Verbalized understanding;Returned demonstration       PT Short Term Goals - 12/27/17 1225      PT SHORT TERM GOAL #1   Title  Pt will be independent with HEP and perform consistently  in order to maximize strength to improve gait and balance.    Time  4    Period  Weeks    Status  New    Target Date  01/24/18      PT SHORT TERM GOAL #2   Title  Pt will be mod I with bed mobility in order to improve independence at home and decrease stress on caregiver.     Time  4    Period  Weeks    Status  New      PT SHORT TERM GOAL #3   Title  Pt will have 1/2 grade improvement in MMT in order to maximize gait and balance.    Time  4    Period  Weeks    Status  New      PT SHORT TERM GOAL #4   Title  Pt will score 14/24 or > on the DGI in order to demo improved dynamic gait and balance to decrease risk for falls.    Time  4    Period  Weeks    Status  New        PT Long Term Goals - 12/27/17 1257      PT LONG TERM GOAL #1   Title  Pt will have 1 grade improvement in MMT to maximize transfers, gait, and balance, and decrease risk for falls.    Time  8    Period  Weeks    Status  New    Target Date  02/21/18      PT LONG TERM GOAL #2    Title  Pt will perform 5xSTS in 15 sec or < with no UE to demo improved functional strength and decrease risk for falls.    Time  8    Period  Weeks    Status  New      PT LONG TERM GOAL #3   Title  Pt will be able to perform TUG in 15 sec or < with LRAD to demo improved balance and gait to decrease risk for falls and maximize pt's community access.    Time  8    Period  Weeks    Status  New      PT LONG TERM GOAL #4   Title  Pt will score 17/24 or > on DGI to demo improved overall gait and decrease risk for falls.    Time  8    Period  Weeks    Status  New      PT LONG TERM GOAL #5   Title  Pt will be able to ambulate 483ft with LRAD and will have no more than 2 freezing episodes and festinating gait <50% of the time to demo improved overall gait in order to decrease her risk for falls.    Time  8    Period  Weeks    Status  New            Plan - 01/08/18 1026    Clinical Impression Statement  Continued with Nustep for improved gait sequencing and speed. Standing PWR! Exercises also continued this date, adding standing rock; pt with difficulty shifting weight L and reaching L with this. Added perturbations in standing to elicit stepping strategy which pt was challenged by as she demo'd decreased stepping strategy with retro LOB/perturbations. Continue as planned focusing on gait, strength, and improved speed completing tasks.    Rehab Potential  Fair    PT Frequency  2x /  week    PT Duration  8 weeks    PT Treatment/Interventions  ADLs/Self Care Home Management;Cryotherapy;Electrical Stimulation;Moist Heat;DME Instruction;Gait training;Stair training;Functional mobility training;Therapeutic activities;Therapeutic exercise;Balance training;Neuromuscular re-education;Patient/family education;Manual techniques;Passive range of motion;Dry needling;Energy conservation;Taping    PT Next Visit Plan  Continue strengthening, gait training metronome for cueing, balance training; continue  perterbations for stepping strategy, postural strengthening; progress as able and update HEP    PT Home Exercise Plan  eval: bridging, STS    Consulted and Agree with Plan of Care  Patient;Family member/caregiver    Family Member Consulted  husband       Patient will benefit from skilled therapeutic intervention in order to improve the following deficits and impairments:  Abnormal gait, Decreased activity tolerance, Decreased balance, Decreased endurance, Decreased knowledge of use of DME, Decreased mobility, Decreased strength, Difficulty walking, Impaired flexibility, Improper body mechanics, Postural dysfunction, Pain  Visit Diagnosis: Unsteadiness on feet  History of falling  Muscle weakness (generalized)  Abnormal posture     Problem List Patient Active Problem List   Diagnosis Date Noted  . Mild intermittent asthma, uncomplicated 10/31/2017  . Weight loss, unintentional 07/11/2017  . Dizziness 06/16/2017  . Chronic bilateral low back pain without sciatica 12/19/2016  . 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 Hypertensive retinopathy of both eyes, grade 2 01/15/2016  . Left leg DVT (HCC) 12/18/2015  . 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 Internal hemorrhoids 03/10/2015  . History of colon polyps 12/25/2014  . Health care maintenance 02/13/2014  . Subclinical hypothyroidism 12/25/2013  . Right renal mass 06/11/2012  . Osteoarthritis of both knees 03/15/2012  . Parkinsonism (HCC) 07/13/2010  . ALLERGIC RHINITIS, SEASONAL 02/20/2007  . Hyperlipidemia 09/27/2006  . Essential hypertension 09/27/2006  . GERD 09/27/2006      Jac Canavan PT, DPT  [facility] [address] [place], [place], [address] Phone: [phone]   Fax:  [phone]  Name: ALYDA MEGNA MRN: [id number] Date of Birth: Jul 29, 1941

## 2018-01-11 ENCOUNTER — Encounter (HOSPITAL_COMMUNITY): Payer: Self-pay

## 2018-01-11 ENCOUNTER — Ambulatory Visit (HOSPITAL_COMMUNITY): Payer: PPO

## 2018-01-11 DIAGNOSIS — R293 Abnormal posture: Secondary | ICD-10-CM

## 2018-01-11 DIAGNOSIS — R2681 Unsteadiness on feet: Secondary | ICD-10-CM

## 2018-01-11 DIAGNOSIS — Z9181 History of falling: Secondary | ICD-10-CM

## 2018-01-11 DIAGNOSIS — M6281 Muscle weakness (generalized): Secondary | ICD-10-CM

## 2018-01-11 NOTE — Therapy (Signed)
[facility] [address] [place], [place], [address] Phone: [phone]   Fax:  [phone]  Physical Therapy Treatment  Patient Details  Name: Nicole NgoRuby L Mustin MRN: [id number] Date of Birth: 01/16/1941 Referring Provider: Butch PennyMegan Millikan, NP   Encounter Date: 01/11/2018  PT End of Session - 01/11/18 0953    Visit Number  6    Number of Visits  17    Date for PT Re-Evaluation  01/24/18    Authorization Type  [employer]    Authorization Time Period  12/27/17 to 02/21/18    PT Start Time  0942    PT Stop Time  1030    PT Time Calculation (min)  48 min    Equipment Utilized During Treatment  Gait belt    Activity Tolerance  Patient tolerated treatment well;Patient limited by fatigue    Behavior During Therapy  [facility] for tasks assessed/performed       Past Medical History:  Diagnosis Date  . Arthritis   . GERD (gastroesophageal reflux disease)    pt. states she no longer has GERD (09/11/15)  . Hyperglycemia    isolated FBS 122, 95, 104 (Random 138)  . Hyperlipidemia    on pravastatin  . Hypertension    controlled on 2 agents  . 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 Obesity   . Parkinson's disease (tremor, stiffness, slow motion, unstable posture) (HCC)   . Shortness of breath dyspnea    pt. states that she no longer has SOB )09/11/15  . Tremor     Past Surgical History:  Procedure Laterality Date  . CESAREAN SECTION    . COLONOSCOPY    . TOTAL KNEE ARTHROPLASTY Left 09/23/2015   Procedure: LEFT TOTAL KNEE ARTHROPLASTY;  Surgeon: Loreta Aveaniel F Murphy, MD;  [facility];  Service: Orthopedics;  Laterality: Left;    There were no vitals filed for this visit.  Subjective Assessment - 01/11/18 0952    Subjective  Pt stated she had a good night rest last night, reports she is feeling a little rought this morning.  Pain in Rt knee today.      Currently in Pain?  Yes    Pain Score  5     Pain Location  Knee     Pain Orientation  Right    Pain Descriptors / Indicators  Sore    Pain Type  Chronic pain    Pain Onset  More than a month ago    Pain Frequency  Intermittent    Aggravating Factors   using it a lot bending and straightening it    Pain Relieving Factors  no special thing seems to help, less activity    Effect of Pain on Daily Activities  slight increases                      [facility] Adult PT Treatment/Exercise - 01/11/18 0001      Ambulation/Gait   Ambulation/Gait  Yes    Ambulation/Gait Assistance  4: Min guard    Ambulation Distance (Feet)  452 Feet 2x 226    Assistive device  Straight cane    Pre-Gait Activities  Nustep x 5 minutes to promote reciprocal UE/LE movement.  Therapist facilitaiton     Gait Comments  Festinating gait with decreased arm swing, decreased stride length, and freezing noted throughout gait. Patient was cued using a metronome set at 75 bpm to perform ambulation. Verbal  cues to take larger steps and move cane with opposite lower extremity.       Posture/Postural Control   Posture/Postural Control  Postural limitations    Postural Limitations  Rounded Shoulders;Forward head;Increased thoracic kyphosis      Knee/Hip Exercises: Standing   Gait Training  x18ft after nustep focusing on sequencing, speed, and arm speed    Other Standing Knee Exercises  alternating shoulder extension with RTB to simulate reciprocal movements    Other Standing Knee Exercises  fwd gait thru agility ladder 19ft x5RT      Knee/Hip Exercises: Seated   Other Seated Knee/Hip Exercises  scap retractions with RTB 2x10reps    Marching  2 sets;10 reps        PWR [facility]) - 01/11/18 1033    PWR! Up  5x    PWR! Rock  5x    PWR Step  5x BLE       Balance Exercises - 01/11/18 1033      Balance Exercises: Standing   Marching Limitations  seated 10x alternating          PT Short Term Goals - 12/27/17 1225      PT SHORT TERM GOAL #1   Title  Pt will be independent  with HEP and perform consistently in order to maximize strength to improve gait and balance.    Time  4    Period  Weeks    Status  New    Target Date  01/24/18      PT SHORT TERM GOAL #2   Title  Pt will be mod I with bed mobility in order to improve independence at home and decrease stress on caregiver.     Time  4    Period  Weeks    Status  New      PT SHORT TERM GOAL #3   Title  Pt will have 1/2 grade improvement in MMT in order to maximize gait and balance.    Time  4    Period  Weeks    Status  New      PT SHORT TERM GOAL #4   Title  Pt will score 14/24 or > on the DGI in order to demo improved dynamic gait and balance to decrease risk for falls.    Time  4    Period  Weeks    Status  New        PT Long Term Goals - 12/27/17 1257      PT LONG TERM GOAL #1   Title  Pt will have 1 grade improvement in MMT to maximize transfers, gait, and balance, and decrease risk for falls.    Time  8    Period  Weeks    Status  New    Target Date  02/21/18      PT LONG TERM GOAL #2   Title  Pt will perform 5xSTS in 15 sec or < with no UE to demo improved functional strength and decrease risk for falls.    Time  8    Period  Weeks    Status  New      PT LONG TERM GOAL #3   Title  Pt will be able to perform TUG in 15 sec or < with LRAD to demo improved balance and gait to decrease risk for falls and maximize pt's community access.    Time  8    Period  Weeks    Status  New  PT LONG TERM GOAL #4   Title  Pt will score 17/24 or > on DGI to demo improved overall gait and decrease risk for falls.    Time  8    Period  Weeks    Status  New      PT LONG TERM GOAL #5   Title  Pt will be able to ambulate 435ft with LRAD and will have no more than 2 freezing episodes and festinating gait <50% of the time to demo improved overall gait in order to decrease her risk for falls.    Time  8    Period  Weeks    Status  New            Plan - 01/11/18 1245    Clinical  Impression Statement  Continued session focus to improve sequencing wiht UE/LE during gait and balance training.  Added alternating postural strengthening to improve arm swing during gait.  Pt continues to require therapist facilitaiton to improve sequence and cueing to increase stride length during gait.  Continues with standing PWR! exercises for balance training and strengthening.      Rehab Potential  Fair    PT Frequency  2x / week    PT Duration  8 weeks    PT Treatment/Interventions  ADLs/Self Care Home Management;Cryotherapy;Electrical Stimulation;Moist Heat;DME Instruction;Gait training;Stair training;Functional mobility training;Therapeutic activities;Therapeutic exercise;Balance training;Neuromuscular re-education;Patient/family education;Manual techniques;Passive range of motion;Dry needling;Energy conservation;Taping    PT Next Visit Plan  Continue strengthening, gait training metronome for cueing, balance training; continue perterbations for stepping strategy, postural strengthening; progress as able and update HEP       Patient will benefit from skilled therapeutic intervention in order to improve the following deficits and impairments:  Abnormal gait, Decreased activity tolerance, Decreased balance, Decreased endurance, Decreased knowledge of use of DME, Decreased mobility, Decreased strength, Difficulty walking, Impaired flexibility, Improper body mechanics, Postural dysfunction, Pain  Visit Diagnosis: Unsteadiness on feet  History of falling  Muscle weakness (generalized)  Abnormal posture     Problem List Patient Active Problem List   Diagnosis Date Noted  . Mild intermittent asthma, uncomplicated 10/31/2017  . Weight loss, unintentional 07/11/2017  . Dizziness 06/16/2017  . Chronic bilateral low back pain without sciatica 12/19/2016  . 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 Hypertensive retinopathy of both eyes, grade 2 01/15/2016  . Left leg DVT (HCC)  12/18/2015  . 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 Internal hemorrhoids 03/10/2015  . History of colon polyps 12/25/2014  . Health care maintenance 02/13/2014  . Subclinical hypothyroidism 12/25/2013  . Right renal mass 06/11/2012  . Osteoarthritis of both knees 03/15/2012  . Parkinsonism (HCC) 07/13/2010  . ALLERGIC RHINITIS, SEASONAL 02/20/2007  . Hyperlipidemia 09/27/2006  . Essential hypertension 09/27/2006  . GERD 09/27/2006   Becky Sax, LPTA; CBIS [phone]  Juel Burrow 01/11/2018, 12:49 PM  [facility] [address] [place], [place], [address] Phone: [phone]   Fax:  [phone]  Name: SALLIE STARON MRN: [id number] Date of Birth: 02/25/1941

## 2018-01-16 ENCOUNTER — Encounter (HOSPITAL_COMMUNITY): Payer: Self-pay

## 2018-01-16 ENCOUNTER — Ambulatory Visit (HOSPITAL_COMMUNITY): Payer: PPO

## 2018-01-16 DIAGNOSIS — R293 Abnormal posture: Secondary | ICD-10-CM

## 2018-01-16 DIAGNOSIS — M6281 Muscle weakness (generalized): Secondary | ICD-10-CM

## 2018-01-16 DIAGNOSIS — R2681 Unsteadiness on feet: Secondary | ICD-10-CM | POA: Diagnosis not present

## 2018-01-16 DIAGNOSIS — Z9181 History of falling: Secondary | ICD-10-CM

## 2018-01-16 NOTE — Therapy (Signed)
[facility] [address] [place], [place], [address] Phone: [phone]   Fax:  [phone]  Physical Therapy Treatment  Patient Details  Name: Nicole Baxter MRN: [id number] Date of Birth: March 08, 1941 Referring Provider: Butch Penny, NP   Encounter Date: 01/16/2018  PT End of Session - 01/16/18 0950    Visit Number  7    Number of Visits  17    Date for PT Re-Evaluation  01/24/18    Authorization Type  [employer]    Authorization Time Period  12/27/17 to 02/21/18    PT Start Time  0945    PT Stop Time  1030    PT Time Calculation (min)  45 min    Equipment Utilized During Treatment  Gait belt    Activity Tolerance  Patient tolerated treatment well;Patient limited by fatigue    Behavior During Therapy  [facility] for tasks assessed/performed       Past Medical History:  Diagnosis Date  . Arthritis   . GERD (gastroesophageal reflux disease)    pt. states she no longer has GERD (09/11/15)  . Hyperglycemia    isolated FBS 122, 95, 104 (Random 138)  . Hyperlipidemia    on pravastatin  . Hypertension    controlled on 2 agents  . 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 Obesity   . Parkinson's disease (tremor, stiffness, slow motion, unstable posture) (HCC)   . Shortness of breath dyspnea    pt. states that she no longer has SOB )09/11/15  . Tremor     Past Surgical History:  Procedure Laterality Date  . CESAREAN SECTION    . COLONOSCOPY    . TOTAL KNEE ARTHROPLASTY Left 09/23/2015   Procedure: LEFT TOTAL KNEE ARTHROPLASTY;  Surgeon: Loreta Ave, MD;  Location: [facility] OR;  Service: Orthopedics;  Laterality: Left;    There were no vitals filed for this visit.  Subjective Assessment - 01/16/18 0951    Subjective  Pt reports that she is doing well today, just cold. Her husband states that her walking was really bad Sunday but it has improved since.    Currently in Pain?  No/denies    Pain Onset  More than a  month ago            [facility] Adult PT Treatment/Exercise - 01/16/18 0001      Knee/Hip Exercises: Aerobic   Nustep  x5 mins L2, seat 7, maintaining SPMs >30, to improve gait sequencing and speed      Knee/Hip Exercises: Standing   Gait Training  x1 lap after nustep focusin on step length, arm swing, and gait speed with SPC      PWR [facility]) - 01/16/18 1010    PWR! Up  x5    PWR! Rock  x5    PWR Step  x5 BLE          PT Education - 01/16/18 1031    Education provided  Yes    Education Details  gait sequencing/speed/arm swing    Person(s) Educated  Patient;Spouse    Methods  Explanation;Demonstration    Comprehension  Verbalized understanding;Returned demonstration       PT Short Term Goals - 12/27/17 1225      PT SHORT TERM GOAL #1   Title  Pt will be independent with HEP and perform consistently in order to maximize strength to improve gait and balance.    Time  4  Period  Weeks    Status  New    Target Date  01/24/18      PT SHORT TERM GOAL #2   Title  Pt will be mod I with bed mobility in order to improve independence at home and decrease stress on caregiver.     Time  4    Period  Weeks    Status  New      PT SHORT TERM GOAL #3   Title  Pt will have 1/2 grade improvement in MMT in order to maximize gait and balance.    Time  4    Period  Weeks    Status  New      PT SHORT TERM GOAL #4   Title  Pt will score 14/24 or > on the DGI in order to demo improved dynamic gait and balance to decrease risk for falls.    Time  4    Period  Weeks    Status  New        PT Long Term Goals - 12/27/17 1257      PT LONG TERM GOAL #1   Title  Pt will have 1 grade improvement in MMT to maximize transfers, gait, and balance, and decrease risk for falls.    Time  8    Period  Weeks    Status  New    Target Date  02/21/18      PT LONG TERM GOAL #2   Title  Pt will perform 5xSTS in 15 sec or < with no UE to demo improved functional strength and decrease risk for  falls.    Time  8    Period  Weeks    Status  New      PT LONG TERM GOAL #3   Title  Pt will be able to perform TUG in 15 sec or < with LRAD to demo improved balance and gait to decrease risk for falls and maximize pt's community access.    Time  8    Period  Weeks    Status  New      PT LONG TERM GOAL #4   Title  Pt will score 17/24 or > on DGI to demo improved overall gait and decrease risk for falls.    Time  8    Period  Weeks    Status  New      PT LONG TERM GOAL #5   Title  Pt will be able to ambulate 428ft with LRAD and will have no more than 2 freezing episodes and festinating gait <50% of the time to demo improved overall gait in order to decrease her risk for falls.    Time  8    Period  Weeks    Status  New            Plan - 01/16/18 1030    Clinical Impression Statement  Began session on Nustep this date to continue to promote proper gait sequencing and improved gait speed. During ambulation following, pt continued to required cues for arm swing and larger step lengths. At end of first lap, pt all of a sudden stated that she felt tired and wanted to sit; she stated that she felt a little woozy and dizzy once sitting. She stated that she had not eaten breakfast yet; BP was 157/88, HR 73. Pt and husband state that this is about her usual BP. After a few mins of rest, she stated that she  felt better and wished to continue therapy. Rest of session focused on PWR! Moves; pt had to use the restroom during treatment which further limited session. Pt with difficulty with PWR! Rock and step this date. Continue as planned progressing as able.    Rehab Potential  Fair    PT Frequency  2x / week    PT Duration  8 weeks    PT Treatment/Interventions  ADLs/Self Care Home Management;Cryotherapy;Electrical Stimulation;Moist Heat;DME Instruction;Gait training;Stair training;Functional mobility training;Therapeutic activities;Therapeutic exercise;Balance training;Neuromuscular  re-education;Patient/family education;Manual techniques;Passive range of motion;Dry needling;Energy conservation;Taping    PT Next Visit Plan  Continue strengthening, gait training metronome for cueing, balance training; continue perterbations for stepping strategy, postural strengthening; progress as able and update HEP    PT Home Exercise Plan  eval: bridging, STS    Consulted and Agree with Plan of Care  Patient;Family member/caregiver    Family Member Consulted  husband       Patient will benefit from skilled therapeutic intervention in order to improve the following deficits and impairments:  Abnormal gait, Decreased activity tolerance, Decreased balance, Decreased endurance, Decreased knowledge of use of DME, Decreased mobility, Decreased strength, Difficulty walking, Impaired flexibility, Improper body mechanics, Postural dysfunction, Pain  Visit Diagnosis: Unsteadiness on feet  History of falling  Muscle weakness (generalized)  Abnormal posture     Problem List Patient Active Problem List   Diagnosis Date Noted  . Mild intermittent asthma, uncomplicated 10/31/2017  . Weight loss, unintentional 07/11/2017  . Dizziness 06/16/2017  . Chronic bilateral low back pain without sciatica 12/19/2016  . 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 Hypertensive retinopathy of both eyes, grade 2 01/15/2016  . Left leg DVT (HCC) 12/18/2015  . 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 Internal hemorrhoids 03/10/2015  . History of colon polyps 12/25/2014  . Health care maintenance 02/13/2014  . Subclinical hypothyroidism 12/25/2013  . Right renal mass 06/11/2012  . Osteoarthritis of both knees 03/15/2012  . Parkinsonism (HCC) 07/13/2010  . ALLERGIC RHINITIS, SEASONAL 02/20/2007  . Hyperlipidemia 09/27/2006  . Essential hypertension 09/27/2006  . GERD 09/27/2006       Jac CanavanBrooke Powell PT, DPT  [facility] [address] [place] [place], [place], [address] Phone: [phone]   Fax:  [phone]  Name: Randa NgoRuby L Thurlow MRN: [id number] Date of Birth: 10/21/1941

## 2018-01-18 ENCOUNTER — Other Ambulatory Visit: Payer: Self-pay

## 2018-01-18 ENCOUNTER — Ambulatory Visit (HOSPITAL_COMMUNITY): Payer: PPO

## 2018-01-18 ENCOUNTER — Encounter (HOSPITAL_COMMUNITY): Payer: Self-pay

## 2018-01-18 ENCOUNTER — Ambulatory Visit (INDEPENDENT_AMBULATORY_CARE_PROVIDER_SITE_OTHER): Payer: PPO | Admitting: Internal Medicine

## 2018-01-18 VITALS — BP 94/76 | HR 95 | Temp 97.8°F | Ht 61.0 in | Wt 132.7 lb

## 2018-01-18 DIAGNOSIS — R293 Abnormal posture: Secondary | ICD-10-CM

## 2018-01-18 DIAGNOSIS — R2681 Unsteadiness on feet: Secondary | ICD-10-CM

## 2018-01-18 DIAGNOSIS — R42 Dizziness and giddiness: Secondary | ICD-10-CM

## 2018-01-18 DIAGNOSIS — Z9181 History of falling: Secondary | ICD-10-CM

## 2018-01-18 DIAGNOSIS — M6281 Muscle weakness (generalized): Secondary | ICD-10-CM

## 2018-01-18 NOTE — Patient Instructions (Addendum)
[name] it was nice meeting you today.  -Please make sure you are drinking plenty of water and staying hydrated.  FOLLOW-UP INSTRUCTIONS When: February 25th 2019 or January 23, 2018.  Please call us sooner if you continue to feel dizzy. For: Follow-up of lightheadedness and blood pressure recheck What to bring: Please bring all your medications to this visit.    Orthostatic Hypotension Orthostatic hypotension is a sudden drop in blood pressure that happens when you quickly change positions, such as when you get up from a seated or lying position. Blood pressure is a measurement of how strongly, or weakly, your blood is pressing against the walls of your arteries. Arteries are blood vessels that carry blood from your heart throughout your body. When blood pressure is too low, you may not get enough blood to your brain or to the rest of your organs. This can cause weakness, light-headedness, rapid heartbeat, and fainting. This can last for just a few seconds or for up to a few minutes. Orthostatic hypotension is usually not a serious problem. However, if it happens frequently or gets worse, it may be a sign of something more serious. What are the causes? This condition may be caused by:  Sudden changes in posture, such as standing up quickly after you have been sitting or lying down.  Blood loss.  Loss of body fluids (dehydration).  Heart problems.  Hormone (endocrine) problems.  Pregnancy.  Severe infection.  Lack of certain nutrients.  Severe allergic reactions (anaphylaxis).  Certain medicines, such as blood pressure medicine or medicines that make the body lose excess fluids (diuretics). Sometimes, this condition can be caused by not taking medicine as directed, such as taking too much of a certain medicine.  What increases the risk? Certain factors can make you more likely to develop orthostatic hypotension, including:  Age. Risk increases as you get older.  Conditions  that affect the heart or the central nervous system.  Taking certain medicines, such as blood pressure medicine or diuretics.  Being pregnant.  What are the signs or symptoms? Symptoms of this condition may include:  Weakness.  Light-headedness.  Dizziness.  Blurred vision.  Fatigue.  Rapid heartbeat.  Fainting, in severe cases.  How is this diagnosed? This condition is diagnosed based on:  Your medical history.  Your symptoms.  Your blood pressure measurement. Your health care provider will check your blood pressure when you are: ? Lying down. ? Sitting. ? Standing.  A blood pressure reading is recorded as two numbers, such as "120 over 80" (or 120/80). The first ("top") number is called the systolic pressure. It is a measure of the pressure in your arteries as your heart beats. The second ("bottom") number is called the diastolic pressure. It is a measure of the pressure in your arteries when your heart relaxes between beats. Blood pressure is measured in a unit called mm Hg. Healthy blood pressure for adults is 120/80. If your blood pressure is below 90/60, you may be diagnosed with hypotension. Other information or tests that may be used to diagnose orthostatic hypotension include:  Your other vital signs, such as your heart rate and temperature.  Blood tests.  Tilt table test. For this test, you will be safely secured to a table that moves you from a lying position to an upright position. Your heart rhythm and blood pressure will be monitored during the test.  How is this treated? Treatment for this condition may include:  Changing your diet. This may  involve eating more salt (sodium) or drinking more water.  Taking medicines to raise your blood pressure.  Changing the dosage of certain medicines you are taking that might be lowering your blood pressure.  Wearing compression stockings. These stockings help to prevent blood clots and reduce swelling in your  legs.  In some cases, you may need to go to the hospital for:  Fluid replacement. This means you will receive fluids through an IV tube.  Blood replacement. This means you will receive donated blood through an IV tube (transfusion).  Treating an infection or heart problems, if this applies.  Monitoring. You may need to be monitored while medicines that you are taking wear off.  Follow these instructions at home: Eating and drinking   Drink enough fluid to keep your urine clear or pale yellow.  Eat a healthy diet and follow instructions from your health care provider about eating or drinking restrictions. A healthy diet includes: ? Fresh fruits and vegetables. ? Whole grains. ? Lean meats. ? Low-fat dairy products.  Eat extra salt only as directed. Do not add extra salt to your diet unless your health care provider told you to do that.  Eat frequent, small meals.  Avoid standing up suddenly after eating. Medicines  Take over-the-counter and prescription medicines only as told by your health care provider. ? Follow instructions from your health care provider about changing the dosage of your current medicines, if this applies. ? Do not stop or adjust any of your medicines on your own. General instructions  Wear compression stockings as told by your health care provider.  Get up slowly from lying down or sitting positions. This gives your blood pressure a chance to adjust.  Avoid hot showers and excessive heat as directed by your health care provider.  Return to your normal activities as told by your health care provider. Ask your health care provider what activities are safe for you.  Do not use any products that contain nicotine or tobacco, such as cigarettes and e-cigarettes. If you need help quitting, ask your health care provider.  Keep all follow-up visits as told by your health care provider. This is important. Contact a health care provider if:  You vomit.  You  have diarrhea.  You have a fever for more than 2-3 days.  You feel more thirsty than usual.  You feel weak and tired. Get help right away if:  You have chest pain.  You have a fast or irregular heartbeat.  You develop numbness in any part of your body.  You cannot move your arms or your legs.  You have trouble speaking.  You become sweaty or feel lightheaded.  You faint.  You feel short of breath.  You have trouble staying awake.  You feel confused. This information is not intended to replace advice given to you by your health care provider. Make sure you discuss any questions you have with your health care provider. Document Released: 11/04/2002 Document Revised: 08/02/2016 Document Reviewed: 05/06/2016 Elsevier Interactive Patient Education  2018 [employer].

## 2018-01-18 NOTE — Therapy (Signed)
[facility] [address] [place], [place], [address] Phone: [phone]   Fax:  [phone]  Physical Therapy Treatment  Patient Details  Name: Nicole Baxter MRN: [id number] Date of Birth: 1941/07/25 Referring Provider: Butch Penny, NP   Encounter Date: 01/18/2018  PT End of Session - 01/18/18 0859    Visit Number  8    Number of Visits  17    Date for PT Re-Evaluation  01/24/18    Authorization Type  [employer]    Authorization Time Period  12/27/17 to 02/21/18    PT Start Time  0900    PT Stop Time  0942    PT Time Calculation (min)  42 min    Equipment Utilized During Treatment  Gait belt    Activity Tolerance  Patient tolerated treatment well;Patient limited by fatigue    Behavior During Therapy  [facility] for tasks assessed/performed       Past Medical History:  Diagnosis Date  . Arthritis   . GERD (gastroesophageal reflux disease)    pt. states she no longer has GERD (09/11/15)  . Hyperglycemia    isolated FBS 122, 95, 104 (Random 138)  . Hyperlipidemia    on pravastatin  . Hypertension    controlled on 2 agents  . 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 Obesity   . Parkinson's disease (tremor, stiffness, slow motion, unstable posture) (HCC)   . Shortness of breath dyspnea    pt. states that she no longer has SOB )09/11/15  . Tremor     Past Surgical History:  Procedure Laterality Date  . CESAREAN SECTION    . COLONOSCOPY    . TOTAL KNEE ARTHROPLASTY Left 09/23/2015   Procedure: LEFT TOTAL KNEE ARTHROPLASTY;  Surgeon: Loreta Ave, MD;  Location: [facility] OR;  Service: Orthopedics;  Laterality: Left;    There were no vitals filed for this visit.  Subjective Assessment - 01/18/18 0900    Subjective  Pt reports that she's moving slow today.     Currently in Pain?  No/denies    Pain Onset  More than a month ago             [facility] Adult PT Treatment/Exercise - 01/18/18 0001      Knee/Hip  Exercises: Standing   Gait Training  x8 mins working on fwd/retro and lateral directional changes      [facility]) - 01/18/18 0904    PWR! Up  x10 (reaching for ball > STS); x10 tossing blue scarf    PWR! Rock  x5 (post-its; increased difficulty reaching L)            PT Education - 01/18/18 0859    Education provided  Yes    Education Details  exercise technique, f/u with PCP regarding BP changes/symptoms    Person(s) Educated  Patient;Spouse    Methods  Explanation;Demonstration    Comprehension  Verbalized understanding;Returned demonstration       PT Short Term Goals - 12/27/17 1225      PT SHORT TERM GOAL #1   Title  Pt will be independent with HEP and perform consistently in order to maximize strength to improve gait and balance.    Time  4    Period  Weeks    Status  New    Target Date  01/24/18      PT SHORT TERM GOAL #2   Title  Pt will be mod I  with bed mobility in order to improve independence at home and decrease stress on caregiver.     Time  4    Period  Weeks    Status  New      PT SHORT TERM GOAL #3   Title  Pt will have 1/2 grade improvement in MMT in order to maximize gait and balance.    Time  4    Period  Weeks    Status  New      PT SHORT TERM GOAL #4   Title  Pt will score 14/24 or > on the DGI in order to demo improved dynamic gait and balance to decrease risk for falls.    Time  4    Period  Weeks    Status  New        PT Long Term Goals - 12/27/17 1257      PT LONG TERM GOAL #1   Title  Pt will have 1 grade improvement in MMT to maximize transfers, gait, and balance, and decrease risk for falls.    Time  8    Period  Weeks    Status  New    Target Date  02/21/18      PT LONG TERM GOAL #2   Title  Pt will perform 5xSTS in 15 sec or < with no UE to demo improved functional strength and decrease risk for falls.    Time  8    Period  Weeks    Status  New      PT LONG TERM GOAL #3   Title  Pt will be able to perform TUG in 15 sec  or < with LRAD to demo improved balance and gait to decrease risk for falls and maximize pt's community access.    Time  8    Period  Weeks    Status  New      PT LONG TERM GOAL #4   Title  Pt will score 17/24 or > on DGI to demo improved overall gait and decrease risk for falls.    Time  8    Period  Weeks    Status  New      PT LONG TERM GOAL #5   Title  Pt will be able to ambulate 4550ft with LRAD and will have no more than 2 freezing episodes and festinating gait <50% of the time to demo improved overall gait in order to decrease her risk for falls.    Time  8    Period  Weeks    Status  New            Plan - 01/18/18 0949    Clinical Impression Statement  Began session with PWR! Moves this date to promote increased strength, speed, and coordination with gait and functional tasks. Added scarf tossing to sit to stands; she had difficulty sequencing tossing with the R hand and catching with the L hand. Introduced pt to directional changes on command this date to promote decreased freezing episodes and festinating gait; pt with increased difficulty with lateral directional changes. Pt c/o lightheadedness and dizziness during gait again this date and required seated rest break. BP 117/66, HR 80bpm. Discussed with pt and her husband regarding this change in BP from Tuesday and her increased c/o lightheadedness and dizziness over the last 2 sessions. Both report no changes in medications and that she has been having this intermittently for a very long time; PT voiced concern as  it has occurred over the last 2 sessions as it has not happened/been reported in previous sessions. PT encouraged husband to call pt's PCP to discuss this with them and see if they are concerned/want to f/u with pt. He verbalized understanding. Did not perform any further activities due to pt's complaints.    Rehab Potential  Fair    PT Frequency  2x / week    PT Duration  8 weeks    PT Treatment/Interventions   ADLs/Self Care Home Management;Cryotherapy;Electrical Stimulation;Moist Heat;DME Instruction;Gait training;Stair training;Functional mobility training;Therapeutic activities;Therapeutic exercise;Balance training;Neuromuscular re-education;Patient/family education;Manual techniques;Passive range of motion;Dry needling;Energy conservation;Taping    PT Next Visit Plan  f/u if they spoke with PCP regarding recent BP changes and c/o lightheadedness/dizziness; Continue strengthening, gait training metronome for cueing, balance training; continue perterbations for stepping strategy, postural strengthening; progress as able and update HEP    PT Home Exercise Plan  eval: bridging, STS    Consulted and Agree with Plan of Care  Patient;Family member/caregiver    Family Member Consulted  husband       Patient will benefit from skilled therapeutic intervention in order to improve the following deficits and impairments:  Abnormal gait, Decreased activity tolerance, Decreased balance, Decreased endurance, Decreased knowledge of use of DME, Decreased mobility, Decreased strength, Difficulty walking, Impaired flexibility, Improper body mechanics, Postural dysfunction, Pain  Visit Diagnosis: Unsteadiness on feet  History of falling  Muscle weakness (generalized)  Abnormal posture     Problem List Patient Active Problem List   Diagnosis Date Noted  . Mild intermittent asthma, uncomplicated 10/31/2017  . Weight loss, unintentional 07/11/2017  . Dizziness 06/16/2017  . Chronic bilateral low back pain without sciatica 12/19/2016  . 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 Hypertensive retinopathy of both eyes, grade 2 01/15/2016  . Left leg DVT (HCC) 12/18/2015  . 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 Internal hemorrhoids 03/10/2015  . History of colon polyps 12/25/2014  . Health care maintenance 02/13/2014  . Subclinical hypothyroidism 12/25/2013  . Right renal  mass 06/11/2012  . Osteoarthritis of both knees 03/15/2012  . Parkinsonism (HCC) 07/13/2010  . ALLERGIC RHINITIS, SEASONAL 02/20/2007  . Hyperlipidemia 09/27/2006  . Essential hypertension 09/27/2006  . GERD 09/27/2006       Jac Canavan PT, DPT  [facility] [address] [place], [place], [address] Phone: [phone]   Fax:  [phone]  Name: Nicole Baxter MRN: [id number] Date of Birth: 1941-09-28

## 2018-01-20 NOTE — Assessment & Plan Note (Signed)
Patient was accompanied by her husband who provided most of the history.  She has been experiencing lightheadedness for several months.  She believes this happens once a month but her husband disagreed stating it is happening more frequently.  Patient does not believe her lightheadedness is related to changes in her head position.  Does state that this happens only when she is standing.  She also asked reports feeling "hot on the forehead," having headaches, and diaphoresis when this happens.  Denies having any associated chest pain, palpitations, or shortness of breath.  Denies any episodes of syncope.  Her home blood pressure log shows average systolic readings in the 120s-140s and pulse 70s-80s.  Husband was concerned that the patient barely drinks any water at home not even a full glass most days.  Both patient and husband were not sure how much lisinopril she was taking and did not have medications with him.  Blood pressure soft at 94/76 at this visit.  Orthostatics checked in the clinic were positive but patient remained asymptomatic.  Plan -Advised them to return to the clinic either February 25th or 26th with medication bottles -Encouraged p.o. hydration

## 2018-01-20 NOTE — Progress Notes (Signed)
   CC: Lightheadedness  HPI:  [name] is a 77 y.o. female with a past medical history of conditions listed below presenting to the clinic complaining of lightheadedness.  Patient was accompanied by her husband who provided most of the history.  Please see problem based charting for the status of the patient's current and chronic medical conditions.   Past Medical History:  Diagnosis Date  . Arthritis   . GERD (gastroesophageal reflux disease)    pt. states she no longer has GERD (09/11/15)  . Hyperglycemia    isolated FBS 122, 95, 104 (Random 138)  . Hyperlipidemia    on pravastatin  . Hypertension    controlled on 2 agents  . 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 Obesity   . Parkinson's disease (tremor, stiffness, slow motion, unstable posture) (HCC)   . Shortness of breath dyspnea    pt. states that she no longer has SOB )09/11/15  . Tremor    Review of Systems:  Pertinent positives mentioned in HPI. Remainder of all ROS negative.   Physical Exam:  Vitals:   01/18/18 1513 01/18/18 1515  BP: 94/76   Pulse: 95   Temp: 97.8 F (36.6 C)   TempSrc: Oral   SpO2: 95%   Weight: 132 lb 11.2 oz (60.2 kg) 132 lb 11.5 oz (60.2 kg)  Height: 5\' 1"  (1.549 m) 5\' 1"  (1.549 m)   Physical Exam  Constitutional: She is oriented to person, place, and time. She appears well-developed and well-nourished. No distress.  HENT:  Head: Normocephalic and atraumatic.  Mouth/Throat: Oropharynx is clear and moist.  Eyes: Right eye exhibits no discharge. Left eye exhibits no discharge.  Cardiovascular: Normal rate, regular rhythm and intact distal pulses.  Pulmonary/Chest: Effort normal and breath sounds normal. No respiratory distress. She has no wheezes. She has no rales.  Abdominal: Soft. Bowel sounds are normal. She exhibits no distension. There is no tenderness.  Musculoskeletal: She exhibits no edema.  Neurological: She is alert and oriented to person, place, and  time.  Skin: Skin is warm and dry.    Assessment & Plan:   See Encounters Tab for problem based charting.  Patient seen with Dr. Heide SparkNarendra

## 2018-01-22 ENCOUNTER — Encounter (HOSPITAL_COMMUNITY): Payer: Self-pay

## 2018-01-22 ENCOUNTER — Ambulatory Visit (HOSPITAL_COMMUNITY): Payer: PPO

## 2018-01-22 DIAGNOSIS — M6281 Muscle weakness (generalized): Secondary | ICD-10-CM

## 2018-01-22 DIAGNOSIS — R2681 Unsteadiness on feet: Secondary | ICD-10-CM | POA: Diagnosis not present

## 2018-01-22 DIAGNOSIS — R293 Abnormal posture: Secondary | ICD-10-CM

## 2018-01-22 DIAGNOSIS — Z9181 History of falling: Secondary | ICD-10-CM

## 2018-01-22 NOTE — Therapy (Signed)
[facility] [address] [place], [place], [address] Phone: [phone]   Fax:  [phone]  Physical Therapy Treatment  Patient Details  Name: Nicole Baxter MRN: [id number] Date of Birth: [birth year] Referring Provider: Butch Penny, NP   Encounter Date: 01/22/2018  PT End of Session - 01/22/18 0909    Visit Number  9    Number of Visits  17    Date for PT Re-Evaluation  01/24/18    Authorization Type  [employer]    Authorization Time Period  12/27/17 to 02/21/18    PT Start Time  0900    PT Stop Time  0940    PT Time Calculation (min)  40 min    Equipment Utilized During Treatment  Gait belt    Activity Tolerance  Patient tolerated treatment well;Patient limited by fatigue    Behavior During Therapy  [facility] for tasks assessed/performed       Past Medical History:  Diagnosis Date  . Arthritis   . GERD (gastroesophageal reflux disease)    pt. states she no longer has GERD (09/11/15)  . Hyperglycemia    isolated FBS 122, 95, 104 (Random 138)  . Hyperlipidemia    on pravastatin  . Hypertension    controlled on 2 agents  . 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 Obesity   . Parkinson's disease (tremor, stiffness, slow motion, unstable posture) (HCC)   . Shortness of breath dyspnea    pt. states that she no longer has SOB )09/11/15  . Tremor     Past Surgical History:  Procedure Laterality Date  . CESAREAN SECTION    . COLONOSCOPY    . TOTAL KNEE ARTHROPLASTY Left 09/23/2015   Procedure: LEFT TOTAL KNEE ARTHROPLASTY;  Surgeon: Loreta Ave, MD;  Location: [facility] OR;  Service: Orthopedics;  Laterality: Left;    There were no vitals filed for this visit.  Subjective Assessment - 01/22/18 0908    Subjective  Pt and her husband state they went to see the doctor last week after her last session. They state that they checked her BP but weren't that concerned with anything. They go back tomorrow for another  f/u.    Currently in Pain?  No/denies    Pain Onset  More than a month ago            [facility] Adult PT Treatment/Exercise - 01/22/18 0001      Knee/Hip Exercises: Aerobic   Nustep  x5 mins EOS, L2, maintaining SPM >35      Knee/Hip Exercises: Standing   Gait Training  x160ft with SPC + dual task focusing on improving step length       [facility]) - 01/22/18 0910    PWR! Up  x5 (reaching for ball on 12" step); x5reps STS + tossing blue scarf each    PWR! Rock  x5 (post-its, increased difficulty with L sided reach)    PWR Step  x3 BLE in // bars over 6" step             PT Education - 01/22/18 0909    Education provided  Yes    Education Details  gait sequence, exercise technique    Person(s) Educated  Patient;Spouse    Methods  Explanation;Demonstration    Comprehension  Verbalized understanding;Returned demonstration       PT Short Term Goals - 12/27/17 1225      PT SHORT TERM GOAL #1  Title  Pt will be independent with HEP and perform consistently in order to maximize strength to improve gait and balance.    Time  4    Period  Weeks    Status  New    Target Date  01/24/18      PT SHORT TERM GOAL #2   Title  Pt will be mod I with bed mobility in order to improve independence at home and decrease stress on caregiver.     Time  4    Period  Weeks    Status  New      PT SHORT TERM GOAL #3   Title  Pt will have 1/2 grade improvement in MMT in order to maximize gait and balance.    Time  4    Period  Weeks    Status  New      PT SHORT TERM GOAL #4   Title  Pt will score 14/24 or > on the DGI in order to demo improved dynamic gait and balance to decrease risk for falls.    Time  4    Period  Weeks    Status  New        PT Long Term Goals - 12/27/17 1257      PT LONG TERM GOAL #1   Title  Pt will have 1 grade improvement in MMT to maximize transfers, gait, and balance, and decrease risk for falls.    Time  8    Period  Weeks    Status  New     Target Date  02/21/18      PT LONG TERM GOAL #2   Title  Pt will perform 5xSTS in 15 sec or < with no UE to demo improved functional strength and decrease risk for falls.    Time  8    Period  Weeks    Status  New      PT LONG TERM GOAL #3   Title  Pt will be able to perform TUG in 15 sec or < with LRAD to demo improved balance and gait to decrease risk for falls and maximize pt's community access.    Time  8    Period  Weeks    Status  New      PT LONG TERM GOAL #4   Title  Pt will score 17/24 or > on DGI to demo improved overall gait and decrease risk for falls.    Time  8    Period  Weeks    Status  New      PT LONG TERM GOAL #5   Title  Pt will be able to ambulate 451ft with LRAD and will have no more than 2 freezing episodes and festinating gait <50% of the time to demo improved overall gait in order to decrease her risk for falls.    Time  8    Period  Weeks    Status  New            Plan - 01/22/18 0935    Clinical Impression Statement  Pt and her husband state that they followed up with her doctor last week and they checked her BP but didn't seem too concerned; they are f/u again tomorrow. Continued with PWR! Moves this date to improve speed, precision, and coordination of movements. She continues to have difficulty with standing PWR! reach and standing step. Recommend regressing PWR! Step to sitting since she had difficulty sequencing it in  standing. Ended on Nustep focusing on keeping SPM > 35 to promote improved gait sequencing and speed. Pt continues to demo slowed, labored movements throughout session. Continue as planned, improving as able.    Rehab Potential  Fair    PT Frequency  2x / week    PT Duration  8 weeks    PT Treatment/Interventions  ADLs/Self Care Home Management;Cryotherapy;Electrical Stimulation;Moist Heat;DME Instruction;Gait training;Stair training;Functional mobility training;Therapeutic activities;Therapeutic exercise;Balance  training;Neuromuscular re-education;Patient/family education;Manual techniques;Passive range of motion;Dry needling;Energy conservation;Taping    PT Next Visit Plan  reassessment; f/u if they spoke with PCP regarding recent BP changes and c/o lightheadedness/dizziness; PWR! step in sitting; Continue strengthening, gait training metronome for cueing, balance training; continue perterbations for stepping strategy, postural strengthening; progress as able and update HEP    PT Home Exercise Plan  eval: bridging, STS    Consulted and Agree with Plan of Care  Patient;Family member/caregiver    Family Member Consulted  husband       Patient will benefit from skilled therapeutic intervention in order to improve the following deficits and impairments:  Abnormal gait, Decreased activity tolerance, Decreased balance, Decreased endurance, Decreased knowledge of use of DME, Decreased mobility, Decreased strength, Difficulty walking, Impaired flexibility, Improper body mechanics, Postural dysfunction, Pain  Visit Diagnosis: Unsteadiness on feet  History of falling  Muscle weakness (generalized)  Abnormal posture     Problem List Patient Active Problem List   Diagnosis Date Noted  . Mild intermittent asthma, uncomplicated 10/31/2017  . Weight loss, unintentional 07/11/2017  . Lightheadedness 06/16/2017  . Chronic bilateral low back pain without sciatica 12/19/2016  . 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 Hypertensive retinopathy of both eyes, grade 2 01/15/2016  . Left leg DVT (HCC) 12/18/2015  . 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 Internal hemorrhoids 03/10/2015  . History of colon polyps 12/25/2014  . Health care maintenance 02/13/2014  . Subclinical hypothyroidism 12/25/2013  . Right renal mass 06/11/2012  . Osteoarthritis of both knees 03/15/2012  . Parkinsonism (HCC) 07/13/2010  . ALLERGIC RHINITIS, SEASONAL 02/20/2007  . Hyperlipidemia  09/27/2006  . Essential hypertension 09/27/2006  . GERD 09/27/2006       Jac CanavanBrooke Evangelia Whitaker PT, DPT  [facility] [address] [place], [place], [address] Phone: [phone]   Fax:  [phone]  Name: Randa NgoRuby L Lowrey MRN: [id number] Date of Birth: 01/21/1941

## 2018-01-22 NOTE — Progress Notes (Signed)
Internal Medicine Clinic Attending  Case discussed with Dr. Rathoreat the time of the visit. We reviewed the resident's history and exam and pertinent patient test results. I agree with the assessment, diagnosis, and plan of care documented in the resident's note.  

## 2018-01-23 ENCOUNTER — Other Ambulatory Visit: Payer: Self-pay

## 2018-01-23 ENCOUNTER — Encounter: Payer: Self-pay | Admitting: Internal Medicine

## 2018-01-23 ENCOUNTER — Ambulatory Visit (INDEPENDENT_AMBULATORY_CARE_PROVIDER_SITE_OTHER): Payer: PPO | Admitting: Internal Medicine

## 2018-01-23 VITALS — BP 112/81 | HR 67 | Temp 98.0°F | Ht 61.0 in | Wt 125.7 lb

## 2018-01-23 DIAGNOSIS — I10 Essential (primary) hypertension: Secondary | ICD-10-CM

## 2018-01-23 DIAGNOSIS — R634 Abnormal weight loss: Secondary | ICD-10-CM

## 2018-01-23 DIAGNOSIS — R42 Dizziness and giddiness: Secondary | ICD-10-CM

## 2018-01-23 DIAGNOSIS — Z6823 Body mass index (BMI) 23.0-23.9, adult: Secondary | ICD-10-CM

## 2018-01-23 DIAGNOSIS — Z79899 Other long term (current) drug therapy: Secondary | ICD-10-CM | POA: Diagnosis not present

## 2018-01-23 DIAGNOSIS — R350 Frequency of micturition: Secondary | ICD-10-CM | POA: Insufficient documentation

## 2018-01-23 MED ORDER — LISINOPRIL 20 MG PO TABS: 20.0000 mg | ORAL_TABLET | Freq: Every day | ORAL | 0 refills | Status: DC

## 2018-01-23 NOTE — Assessment & Plan Note (Signed)
-  This problem is chronic and stable -Patient's blood pressure today is within normal limits -She was not noted to be orthostatic by symptoms or vitals today -Given her recent episodes of lightheadedness as well as a borderline blood pressure on her last visit we will decrease her lisinopril to 20 mg daily -Patient and husband are in agreement with this plan

## 2018-01-23 NOTE — Assessment & Plan Note (Signed)
-  This is now resolved -Patient denies any further episodes of lightheadedness even with standing -She was not found to be orthostatic today -Her lisinopril was decreased to 20 mg daily -No further workup for now

## 2018-01-23 NOTE — Progress Notes (Signed)
   Subjective:    Patient ID: Nicole Baxter, female    DOB: 03/30/1941, 77 y.o.   MRN: [id number]  HPI  I have seen and examined this patient.  Patient is here for routine follow-up of her hypertension.  [facility] episodes of lightheadedness and was found to be borderline orthostatic with a borderline normal blood pressure.  She was asked to follow-up this week with her medications to see if any change was needed.  Patient states that since her appointment last week she has been feeling better and her blood pressure has improved since her last visit.   Review of Systems  Constitutional: Negative.   HENT: Negative.   Respiratory: Negative.   Cardiovascular: Negative.   Gastrointestinal: Negative.   Genitourinary: Positive for frequency.  Musculoskeletal: Negative.   Neurological: Negative.   Psychiatric/Behavioral: Negative.        Objective:   Physical Exam  Constitutional: She is oriented to person, place, and time. She appears well-developed and well-nourished.  HENT:  Head: Normocephalic and atraumatic.  Mouth/Throat: No oropharyngeal exudate.  Neck: Neck supple.  Cardiovascular: Normal rate, regular rhythm and normal heart sounds.  Pulmonary/Chest: Effort normal and breath sounds normal. No respiratory distress. She has no wheezes.  Abdominal: Soft. Bowel sounds are normal. She exhibits no distension. There is no tenderness.  Musculoskeletal: Normal range of motion. She exhibits no edema.  Lymphadenopathy:    She has no cervical adenopathy.  Neurological: She is alert and oriented to person, place, and time.  Psychiatric: She has a normal mood and affect. Her behavior is normal.          Assessment & Plan:  Please see problem based charting for assessment and plan:

## 2018-01-23 NOTE — Assessment & Plan Note (Signed)
-  Patient's husband states that patient has been urinating more frequently since yesterday and at one point went to the restroom 3 times in 1 hour. -Patient denies any dysuria or fevers or chills.  She has not noticed an increase in her frequency of urination -We will check a UA today -No antibiotics at this time as suspicion for urinary tract infection remains low

## 2018-01-23 NOTE — Patient Instructions (Signed)
-  It was a pleasure seeing you today -Please follow-up with me in 2 weeks when you have your regularly scheduled appointment -We will check a urine test today to make sure you do not have an infection -We will decrease her lisinopril to 20 mg daily (take half a tablet of recurrent 40 mg dose).  I will also order a new prescription for you at the pharmacy -We will check blood work on your next visit including a thyroid and cholesterol levels as well as your kidney function -Please take Ensure or boost to supplement your meals -We will follow-up your weight on your next visit

## 2018-01-23 NOTE — Assessment & Plan Note (Signed)
-  Patient's weight is down 7 pounds over the last 5 days from her last clinic visit -This is highly unlikely and may be secondary to an issue with the scale -Patient states that her appetite is improved and she is also drinking more water -Encourage patient to take boost or Ensure with meals to help boost her calories -I would hold off on further intervention for now and repeat her weight in 2 weeks when she follows up for her regular appointment

## 2018-01-24 ENCOUNTER — Ambulatory Visit (HOSPITAL_COMMUNITY): Payer: PPO

## 2018-01-24 ENCOUNTER — Telehealth: Payer: Self-pay | Admitting: Internal Medicine

## 2018-01-24 LAB — URINALYSIS, ROUTINE W REFLEX MICROSCOPIC
BILIRUBIN UA: NEGATIVE
Glucose, UA: NEGATIVE
LEUKOCYTES UA: NEGATIVE
Nitrite, UA: NEGATIVE
Protein, UA: NEGATIVE
RBC UA: NEGATIVE
Specific Gravity, UA: >=1.03 — AB (ref 1.005–1.030)
Urobilinogen, Ur: 1 mg/dL (ref 0.2–1.0)
pH, UA: 5 (ref 5.0–7.5)

## 2018-01-24 NOTE — Telephone Encounter (Signed)
Called patient to discuss results of her urinalysis.  UA shows no signs of an infection but it does show signs of increased urinary concentration likely secondary to decreased oral intake.  Patient was encouraged to increase oral hydration.  No further workup at this time.  Patient expresses understanding and is in agreement with plan.

## 2018-01-25 ENCOUNTER — Encounter (HOSPITAL_COMMUNITY): Payer: Self-pay

## 2018-01-25 ENCOUNTER — Ambulatory Visit (HOSPITAL_COMMUNITY): Payer: PPO

## 2018-01-25 DIAGNOSIS — M6281 Muscle weakness (generalized): Secondary | ICD-10-CM

## 2018-01-25 DIAGNOSIS — R2681 Unsteadiness on feet: Secondary | ICD-10-CM | POA: Diagnosis not present

## 2018-01-25 DIAGNOSIS — R293 Abnormal posture: Secondary | ICD-10-CM

## 2018-01-25 DIAGNOSIS — Z9181 History of falling: Secondary | ICD-10-CM

## 2018-01-25 NOTE — Therapy (Addendum)
[facility] [address] [place], [place], [address] Phone: [phone]   Fax:  [phone]  Physical Therapy Treatment/Reassessment  Patient Details  Name: Nicole Baxter MRN: [id number] Date of Birth: 20-Jan-1941 Referring Provider: Butch Penny, NP   Encounter Date: 01/25/2018  PT End of Session - 01/25/18 0951    Visit Number  10    Number of Visits  17    Date for PT Re-Evaluation  02/21/18    Authorization Type  [employer]    Authorization Time Period  12/27/17 to 02/21/18    PT Start Time  0946    PT Stop Time  1025    PT Time Calculation (min)  39 min    Equipment Utilized During Treatment  Gait belt    Activity Tolerance  Patient tolerated treatment well;Patient limited by fatigue    Behavior During Therapy  [facility] for tasks assessed/performed       Past Medical History:  Diagnosis Date  . Arthritis   . GERD (gastroesophageal reflux disease)    pt. states she no longer has GERD (09/11/15)  . Hyperglycemia    isolated FBS 122, 95, 104 (Random 138)  . Hyperlipidemia    on pravastatin  . Hypertension    controlled on 2 agents  . 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 Obesity   . Parkinson's disease (tremor, stiffness, slow motion, unstable posture) (HCC)   . Shortness of breath dyspnea    pt. states that she no longer has SOB )09/11/15  . Tremor     Past Surgical History:  Procedure Laterality Date  . CESAREAN SECTION    . COLONOSCOPY    . TOTAL KNEE ARTHROPLASTY Left 09/23/2015   Procedure: LEFT TOTAL KNEE ARTHROPLASTY;  Surgeon: Loreta Ave, MD;  Location: [facility] OR;  Service: Orthopedics;  Laterality: Left;    There were no vitals filed for this visit.  Subjective Assessment - 01/25/18 0950    Subjective  Pt states that she feels good today. She states that at her f/u the physician wasn't concerned about her c/o dizziness.     Currently in Pain?  No/denies    Pain Onset  More than a  month ago         [facility] PT Assessment - 01/25/18 0001      Assessment   Medical Diagnosis  Parkinson's Disease, gait and balance    Referring Provider  Butch Penny, NP    Next MD Visit  June 2019      Strength   Right Hip Flexion  4+/5 was 4    Right Hip Extension  3-/5    Right Hip ABduction  3-/5 was 2+    Left Hip Flexion  4+/5 was 4    Left Hip Extension  3-/5    Left Hip ABduction  3-/5 was 2+    Right Knee Flexion  5/5 was 4+    Left Knee Flexion  5/5 was 4+    Left Knee Extension  5/5 was 4+    Right Ankle Dorsiflexion  5/5 was 4+    Left Ankle Dorsiflexion  5/5 was 4+      Ambulation/Gait   Ambulation/Gait  Yes    Ambulation/Gait Assistance  4: Min guard    Ambulation Distance (Feet)  226 Feet    Assistive device  Straight cane    Gait Comments  festinating gait 50-75% of the time, especially during turns and when  performing cognitive tasks;  x3 freezing episodes which followed a verbal command of task to complete (i.e. turn here or sit down)      Standardized Balance Assessment   Standardized Balance Assessment  Five Times Sit to Stand;Timed Up and Go Test;Dynamic Gait Index    Five times sit to stand comments   29.75, no UE, difficulty with 1st rep initially      Dynamic Gait Index   Level Surface  Severe Impairment    Change in Gait Speed  Moderate Impairment    Gait with Horizontal Head Turns  Moderate Impairment    Gait with Vertical Head Turns  Moderate Impairment    Gait and Pivot Turn  Moderate Impairment    Step Over Obstacle  Normal    Step Around Obstacles  Mild Impairment    Steps  Moderate Impairment    Total Score  10    DGI comment:  was 11/24      Timed Up and Go Test   TUG  Normal TUG    Normal TUG (seconds)  32.5 [facility]         PT Education - 01/25/18 1037    Education provided  Yes    Education Details  reassessment findings; gait; will update HEP next visit    Person(s) Educated  Patient    Methods  Explanation     Comprehension  Verbalized understanding       PT Short Term Goals - 01/25/18 0951      PT SHORT TERM GOAL #1   Title  Pt will be independent with HEP and perform consistently in order to maximize strength to improve gait and balance.    Baseline  2/28: reports compliance    Time  4    Period  Weeks    Status  Achieved      PT SHORT TERM GOAL #2   Title  Pt will be mod I with bed mobility in order to improve independence at home and decrease stress on caregiver.     Time  4    Period  Weeks    Status  New      PT SHORT TERM GOAL #3   Title  Pt will have 1/2 grade improvement in MMT in order to maximize gait and balance.    Time  4    Period  Weeks    Status  New      PT SHORT TERM GOAL #4   Title  Pt will score 14/24 or > on the DGI in order to demo improved dynamic gait and balance to decrease risk for falls.    Time  4    Period  Weeks    Status  New        PT Long Term Goals - 01/25/18 [id number]      PT LONG TERM GOAL #1   Title  Pt will have 1 grade improvement in MMT to maximize transfers, gait, and balance, and decrease risk for falls.    Time  8    Period  Weeks    Status  New      PT LONG TERM GOAL #2   Title  Pt will perform 5xSTS in 15 sec or < with no UE to demo improved functional strength and decrease risk for falls.    Time  8    Period  Weeks    Status  New      PT LONG TERM  GOAL #3   Title  Pt will be able to perform TUG in 15 sec or < with LRAD to demo improved balance and gait to decrease risk for falls and maximize pt's community access.    Time  8    Period  Weeks    Status  New      PT LONG TERM GOAL #4   Title  Pt will score 17/24 or > on DGI to demo improved overall gait and decrease risk for falls.    Time  8    Period  Weeks    Status  New      PT LONG TERM GOAL #5   Title  Pt will be able to ambulate 434ft with LRAD and will have no more than 2 freezing episodes and festinating gait <50% of the time to demo improved overall gait in  order to decrease her risk for falls.    Time  8    Period  Weeks    Status  New            Plan - 01/25/18 1026    Clinical Impression Statement  PT reassessed pt's goals and outcome measures this date. Pt has made good progress towards goals as illustrated above. Her MMT has improved by at least 1/2 grade, though her proximal hip musculature continues to be significantly weak. Her bed mobility, functional strength, gait, and dynamic balance/gait is still deficient AEB 5xSTS and TUG times as well as pt's gait pattern. She demo'd continued festinating gait pattern 50-75% of the time along with x3 freezing episodes during 1 lap around gym; this was noted to increase/worsen when pt either given cognitive task or verbal command to complete a task. She also continues to struggle with initiating movements (i.e. walking as soon as she stands up) and with making turns. Recommend providing pt with bed level exercises as HEP next visit, working on dual task with gait, and performing turns as well as continuing to improve pt's overall strength, power, and posture to offset/improve Parkinson's side effects .    Rehab Potential  Fair    PT Frequency  2x / week    PT Duration  8 weeks    PT Treatment/Interventions  ADLs/Self Care Home Management;Cryotherapy;Electrical Stimulation;Moist Heat;DME Instruction;Gait training;Stair training;Functional mobility training;Therapeutic activities;Therapeutic exercise;Balance training;Neuromuscular re-education;Patient/family education;Manual techniques;Passive range of motion;Dry needling;Energy conservation;Taping    PT Next Visit Plan  bed level exercises for HEP, dual task + gait, making turns/increasing speed with turns; PWR! step in sitting; Continue strengthening, gait training metronome for cueing, balance training; continue perterbations for stepping strategy, postural strengthening; progress as able and update HEP    PT Home Exercise Plan  eval: bridging, STS     Consulted and Agree with Plan of Care  Patient       Patient will benefit from skilled therapeutic intervention in order to improve the following deficits and impairments:  Abnormal gait, Decreased activity tolerance, Decreased balance, Decreased endurance, Decreased knowledge of use of DME, Decreased mobility, Decreased strength, Difficulty walking, Impaired flexibility, Improper body mechanics, Postural dysfunction, Pain  Visit Diagnosis: Unsteadiness on feet  History of falling  Muscle weakness (generalized)  Abnormal posture     Problem List Patient Active Problem List   Diagnosis Date Noted  . Frequency of urination 01/23/2018  . Mild intermittent asthma, uncomplicated 10/31/2017  . Weight loss, unintentional 07/11/2017  . Lightheadedness 06/16/2017  . Chronic bilateral low back pain without sciatica 12/19/2016  . Dementia in Parkinson's  disease (HCC) 10/08/2016  . Hypertensive retinopathy of both eyes, grade 2 01/15/2016  . Left leg DVT (HCC) 12/18/2015  . 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 History of colon polyps 12/25/2014  . Health care maintenance 02/13/2014  . Subclinical hypothyroidism 12/25/2013  . Right renal mass 06/11/2012  . Osteoarthritis of both knees 03/15/2012  . Parkinsonism (HCC) 07/13/2010  . ALLERGIC RHINITIS, SEASONAL 02/20/2007  . Hyperlipidemia 09/27/2006  . Essential hypertension 09/27/2006  . GERD 09/27/2006       Jac Canavan PT, DPT  [facility] [address] [place], [place], [address] Phone: [phone]   Fax:  [phone]  Name: JAVARIA KNAPKE MRN: [id number] Date of Birth: 1941-10-27

## 2018-01-29 ENCOUNTER — Encounter (HOSPITAL_COMMUNITY): Payer: Self-pay

## 2018-01-29 ENCOUNTER — Telehealth (HOSPITAL_COMMUNITY): Payer: Self-pay | Admitting: Internal Medicine

## 2018-01-29 ENCOUNTER — Ambulatory Visit (HOSPITAL_COMMUNITY): Payer: PPO | Attending: Adult Health

## 2018-01-29 DIAGNOSIS — M6281 Muscle weakness (generalized): Secondary | ICD-10-CM

## 2018-01-29 DIAGNOSIS — R2681 Unsteadiness on feet: Secondary | ICD-10-CM | POA: Diagnosis not present

## 2018-01-29 DIAGNOSIS — Z9181 History of falling: Secondary | ICD-10-CM | POA: Diagnosis not present

## 2018-01-29 DIAGNOSIS — R293 Abnormal posture: Secondary | ICD-10-CM | POA: Insufficient documentation

## 2018-01-29 NOTE — Therapy (Signed)
[facility] [address] [place], [place], [address] Phone: [phone]   Fax:  [phone]  Physical Therapy Treatment  Patient Details  Name: Nicole Baxter MRN: [id number] Date of Birth: 06/21/1941 Referring Provider: Butch PennyMegan Millikan, NP   Encounter Date: 01/29/2018  PT End of Session - 01/29/18 0854    Visit Number  11    Number of Visits  17    Date for PT Re-Evaluation  02/21/18    Authorization Type  [employer]    Authorization Time Period  12/27/17 to 02/21/18    PT Start Time  0850    PT Stop Time  0934    PT Time Calculation (min)  44 min    Equipment Utilized During Treatment  Gait belt    Activity Tolerance  Patient tolerated treatment well;Patient limited by fatigue    Behavior During Therapy  [facility] for tasks assessed/performed       Past Medical History:  Diagnosis Date  . Arthritis   . GERD (gastroesophageal reflux disease)    pt. states she no longer has GERD (09/11/15)  . Hyperglycemia    isolated FBS 122, 95, 104 (Random 138)  . Hyperlipidemia    on pravastatin  . Hypertension    controlled on 2 agents  . 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 Obesity   . Parkinson's disease (tremor, stiffness, slow motion, unstable posture) (HCC)   . Shortness of breath dyspnea    pt. states that she no longer has SOB )09/11/15  . Tremor     Past Surgical History:  Procedure Laterality Date  . CESAREAN SECTION    . COLONOSCOPY    . TOTAL KNEE ARTHROPLASTY Left 09/23/2015   Procedure: LEFT TOTAL KNEE ARTHROPLASTY;  Surgeon: Loreta Aveaniel F Murphy, MD;  [facility];  Service: Orthopedics;  Laterality: Left;    There were no vitals filed for this visit.  Subjective Assessment - 01/29/18 0854    Subjective  Pt states taht she is so-so this morning. No pain.    Currently in Pain?  No/denies    Pain Onset  More than a month ago          [facility] - 01/29/18 0001      Bed  Mobility   Bed Mobility  Sit to Supine;Supine to Sit    Supine to Sit  4: Min assist;4: Min guard    Supine to Sit Details (indicate cue type and reason)  min A for BLE, cues for proper technique      Knee/Hip Exercises: Aerobic   Nustep  x5 mins EOS, L3, maintaining SPM >35      Knee/Hip Exercises: Standing   Gait Training  fwd amb through ladder + dual task x4RT; figure 8s around 2 cones + dual task      Knee/Hip Exercises: Supine   Short Arc Quad Sets  Both;2 sets;10 reps    Bridges  10 reps    Straight Leg Raises  Both;2 sets;10 reps    Other Supine Knee/Hip Exercises  clams RTB 2x10          PT Education - 01/29/18 0854    Education provided  Yes    Education Details  exercise technique, updated HEP; gait pattern    Person(s) Educated  Patient;Spouse    Methods  Explanation;Demonstration;Handout    Comprehension  Verbalized understanding;Returned demonstration       PT Short Term Goals -  01/25/18 0951      PT SHORT TERM GOAL #1   Title  Pt will be independent with HEP and perform consistently in order to maximize strength to improve gait and balance.    Baseline  2/28: reports compliance    Time  4    Period  Weeks    Status  Achieved      PT SHORT TERM GOAL #2   Title  Pt will be mod I with bed mobility in order to improve independence at home and decrease stress on caregiver.     Time  4    Period  Weeks    Status  New      PT SHORT TERM GOAL #3   Title  Pt will have 1/2 grade improvement in MMT in order to maximize gait and balance.    Time  4    Period  Weeks    Status  New      PT SHORT TERM GOAL #4   Title  Pt will score 14/24 or > on the DGI in order to demo improved dynamic gait and balance to decrease risk for falls.    Time  4    Period  Weeks    Status  New        PT Long Term Goals - 01/25/18 1610      PT LONG TERM GOAL #1   Title  Pt will have 1 grade improvement in MMT to maximize transfers, gait, and balance, and decrease risk for  falls.    Time  8    Period  Weeks    Status  New      PT LONG TERM GOAL #2   Title  Pt will perform 5xSTS in 15 sec or < with no UE to demo improved functional strength and decrease risk for falls.    Time  8    Period  Weeks    Status  New      PT LONG TERM GOAL #3   Title  Pt will be able to perform TUG in 15 sec or < with LRAD to demo improved balance and gait to decrease risk for falls and maximize pt's community access.    Time  8    Period  Weeks    Status  New      PT LONG TERM GOAL #4   Title  Pt will score 17/24 or > on DGI to demo improved overall gait and decrease risk for falls.    Time  8    Period  Weeks    Status  New      PT LONG TERM GOAL #5   Title  Pt will be able to ambulate 46ft with LRAD and will have no more than 2 freezing episodes and festinating gait <50% of the time to demo improved overall gait in order to decrease her risk for falls.    Time  8    Period  Weeks    Status  New            Plan - 01/29/18 0932    Clinical Impression Statement  Began session with bed level exercises and updated HEP for pt to continue strengthening safely at home. Educated pt and her husband on proper bed mobility. Rest of session focused on gait training + dual task. Pt with increased difficulty maintaining proper gait mechanics and/or maintaining dual task; she had increased festinating when attempting to perform cognitive task. Pt with  difficulty making turns around cones as well and had increased festinating gait + 1 episode of freezing. Ended on Nustep (progressed to L3) focusing on speed to promote improved gait pattern, sequencing, speed and overall endurance. Continue as planned, progressing as able.     Rehab Potential  Fair    PT Frequency  2x / week    PT Duration  8 weeks    PT Treatment/Interventions  ADLs/Self Care Home Management;Cryotherapy;Electrical Stimulation;Moist Heat;DME Instruction;Gait training;Stair training;Functional mobility  training;Therapeutic activities;Therapeutic exercise;Balance training;Neuromuscular re-education;Patient/family education;Manual techniques;Passive range of motion;Dry needling;Energy conservation;Taping    PT Next Visit Plan  continue dual task + gait, making turns/increasing speed with turns; PWR! step in sitting; Continue strengthening, gait training metronome for cueing, balance training; continue perterbations for stepping strategy, postural strengthening; progress as able and update HEP    PT Home Exercise Plan  eval: bridging, STS; 3/4: supine clams, SLR, SAQ    Consulted and Agree with Plan of Care  Patient;Family member/caregiver    Family Member Consulted  husband       Patient will benefit from skilled therapeutic intervention in order to improve the following deficits and impairments:  Abnormal gait, Decreased activity tolerance, Decreased balance, Decreased endurance, Decreased knowledge of use of DME, Decreased mobility, Decreased strength, Difficulty walking, Impaired flexibility, Improper body mechanics, Postural dysfunction, Pain  Visit Diagnosis: Unsteadiness on feet  History of falling  Muscle weakness (generalized)  Abnormal posture     Problem List Patient Active Problem List   Diagnosis Date Noted  . Frequency of urination 01/23/2018  . Mild intermittent asthma, uncomplicated 10/31/2017  . Weight loss, unintentional 07/11/2017  . Lightheadedness 06/16/2017  . Chronic bilateral low back pain without sciatica 12/19/2016  . 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 Hypertensive retinopathy of both eyes, grade 2 01/15/2016  . Left leg DVT (HCC) 12/18/2015  . 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 History of colon polyps 12/25/2014  . Health care maintenance 02/13/2014  . Subclinical hypothyroidism 12/25/2013  . Right renal mass 06/11/2012  . Osteoarthritis of both knees 03/15/2012  . Parkinsonism (HCC) 07/13/2010  . ALLERGIC  RHINITIS, SEASONAL 02/20/2007  . Hyperlipidemia 09/27/2006  . Essential hypertension 09/27/2006  . GERD 09/27/2006       Jac Canavan PT, DPT  [facility] [address] [place], [place], [address] Phone: [phone]   Fax:  [phone]  Name: Nicole Baxter MRN: [id number] Date of Birth: 31-Aug-1941

## 2018-01-29 NOTE — Telephone Encounter (Signed)
01/29/18  I left patient a message to see about changing the Thursday, 3/7 appt to 3/6 at same time.  We scheduled Barb to be here and put her on the wrong day

## 2018-01-29 NOTE — Patient Instructions (Signed)
  BRIDGING  While lying on your back with knees bent, tighten your lower abdominals, squeeze your buttocks and then raise your buttocks off the floor/bed as creating a "Bridge" with your body. Hold and then lower yourself and repeat.  Perform 1x/day, 2-3 sets of 10-15 rep   STRAIGHT LEG RAISE - SLR  While lying on your back, raise up your leg with a straight knee.  Keep the opposite knee bent with the foot planted on the ground.  Perform 1x/day, 2-3 sets of 10-15 reps each leg   SUPINE HIP ABDUCTION - ELASTIC BAND CLAMS - [place]  Lie down on your back with your knees bent. Place an elastic band around your knees and then draw your knees apart.  Perform 1x/day, 2-3 sets of 10-15 reps with red band   Short Arc Quads (SAQs)  While lying face up, place two rolled towels under the knee to lift it about 5 inches off the mat. Bring your toes towards your head (dorsiflexion)then straighten the knee for a hold of 3-5 seconds. Slowly lower your leg back to the mat.   Perform 1x/day, 2-3 sets of 10-15 reps

## 2018-01-30 ENCOUNTER — Other Ambulatory Visit: Payer: Self-pay | Admitting: *Deleted

## 2018-01-30 ENCOUNTER — Telehealth (HOSPITAL_COMMUNITY): Payer: Self-pay | Admitting: Internal Medicine

## 2018-01-30 DIAGNOSIS — E785 Hyperlipidemia, unspecified: Secondary | ICD-10-CM

## 2018-01-30 DIAGNOSIS — I10 Essential (primary) hypertension: Secondary | ICD-10-CM

## 2018-01-30 MED ORDER — ATORVASTATIN CALCIUM 40 MG PO TABS: 40.0000 mg | ORAL_TABLET | Freq: Every day | ORAL | 3 refills | Status: DC

## 2018-01-30 NOTE — Telephone Encounter (Signed)
01/30/18  Left a 2nd message to see about changing the 3/7 appt to 3/6    MM

## 2018-01-31 ENCOUNTER — Encounter (HOSPITAL_COMMUNITY): Payer: Self-pay | Admitting: Physical Therapy

## 2018-01-31 ENCOUNTER — Ambulatory Visit (HOSPITAL_COMMUNITY): Payer: PPO | Admitting: Physical Therapy

## 2018-01-31 ENCOUNTER — Telehealth (HOSPITAL_COMMUNITY): Payer: Self-pay | Admitting: Internal Medicine

## 2018-01-31 DIAGNOSIS — R293 Abnormal posture: Secondary | ICD-10-CM

## 2018-01-31 DIAGNOSIS — R2681 Unsteadiness on feet: Secondary | ICD-10-CM | POA: Diagnosis not present

## 2018-01-31 DIAGNOSIS — M6281 Muscle weakness (generalized): Secondary | ICD-10-CM

## 2018-01-31 DIAGNOSIS — Z9181 History of falling: Secondary | ICD-10-CM

## 2018-01-31 NOTE — Therapy (Signed)
[facility] [address] [place], [place], [address] Phone: [phone]   Fax:  [phone]  Physical Therapy Treatment  Patient Details  Name: Nicole Baxter MRN: [id number] Date of Birth: 03-27-1941 Referring Provider: Butch Penny, NP   Encounter Date: 01/31/2018  PT End of Session - 01/31/18 1524    Visit Number  12    Number of Visits  17    Date for PT Re-Evaluation  02/21/18    Authorization Type  [employer]    Authorization Time Period  12/27/17 to 02/21/18    PT Start Time  1431    PT Stop Time  1515    PT Time Calculation (min)  44 min    Equipment Utilized During Treatment  Gait belt    Activity Tolerance  Patient tolerated treatment well;Patient limited by fatigue    Behavior During Therapy  [facility] for tasks assessed/performed       Past Medical History:  Diagnosis Date  . Arthritis   . GERD (gastroesophageal reflux disease)    pt. states she no longer has GERD (09/11/15)  . Hyperglycemia    isolated FBS 122, 95, 104 (Random 138)  . Hyperlipidemia    on pravastatin  . Hypertension    controlled on 2 agents  . 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 Obesity   . Parkinson's disease (tremor, stiffness, slow motion, unstable posture) (HCC)   . Shortness of breath dyspnea    pt. states that she no longer has SOB )09/11/15  . Tremor     Past Surgical History:  Procedure Laterality Date  . CESAREAN SECTION    . COLONOSCOPY    . TOTAL KNEE ARTHROPLASTY Left 09/23/2015   Procedure: LEFT TOTAL KNEE ARTHROPLASTY;  Surgeon: Loreta Ave, MD;  Location: [facility] OR;  Service: Orthopedics;  Laterality: Left;    There were no vitals filed for this visit.  Subjective Assessment - 01/31/18 1442    Subjective  Patient reported that she is not having any pain right now, but that she can have pain in both knees at times.     Currently in Pain?  No/denies                      [facility] Adult PT  Treatment/Exercise - 01/31/18 0001      Bed Mobility   Bed Mobility  Sit to Supine;Supine to Sit    Supine to Sit  4: Min assist;4: Min guard    Supine to Sit Details (indicate cue type and reason)  Minimal assistance to lift lower extremities for sit to supine. Minimal guard for supine to sit.      Knee/Hip Exercises: Standing   Gait Training  Forward ambulation through ladder and cognitive dual task x4 round trips through agility ladder; figure 8 around 2 cones x 5 without dual task and 2 times with cognitive dual task      Knee/Hip Exercises: Supine   Short Arc Quad Sets  Both;2 sets;10 reps    Bridges  10 reps    Straight Leg Raises  Both;2 sets;10 reps;Limitations    Straight Leg Raises Limitations  Patient had difficulty with keeping knee straight therapist assisted to keep knee straight    Other Supine Knee/Hip Exercises  clams RTB 2x10             PT Education - 01/31/18 1445    Education provided  Yes  Education Details  Patient was educated on purpose and technique of exercises throughout session.     Person(s) Educated  Patient;Spouse    Methods  Explanation;Tactile cues;Verbal cues;Demonstration    Comprehension  Verbalized understanding;Returned demonstration;Tactile cues required;Verbal cues required       PT Short Term Goals - 01/25/18 0951      PT SHORT TERM GOAL #1   Title  Pt will be independent with HEP and perform consistently in order to maximize strength to improve gait and balance.    Baseline  2/28: reports compliance    Time  4    Period  Weeks    Status  Achieved      PT SHORT TERM GOAL #2   Title  Pt will be mod I with bed mobility in order to improve independence at home and decrease stress on caregiver.     Time  4    Period  Weeks    Status  New      PT SHORT TERM GOAL #3   Title  Pt will have 1/2 grade improvement in MMT in order to maximize gait and balance.    Time  4    Period  Weeks    Status  New      PT SHORT TERM GOAL #4    Title  Pt will score 14/24 or > on the DGI in order to demo improved dynamic gait and balance to decrease risk for falls.    Time  4    Period  Weeks    Status  New        PT Long Term Goals - 01/25/18 [id number]      PT LONG TERM GOAL #1   Title  Pt will have 1 grade improvement in MMT to maximize transfers, gait, and balance, and decrease risk for falls.    Time  8    Period  Weeks    Status  New      PT LONG TERM GOAL #2   Title  Pt will perform 5xSTS in 15 sec or < with no UE to demo improved functional strength and decrease risk for falls.    Time  8    Period  Weeks    Status  New      PT LONG TERM GOAL #3   Title  Pt will be able to perform TUG in 15 sec or < with LRAD to demo improved balance and gait to decrease risk for falls and maximize pt's community access.    Time  8    Period  Weeks    Status  New      PT LONG TERM GOAL #4   Title  Pt will score 17/24 or > on DGI to demo improved overall gait and decrease risk for falls.    Time  8    Period  Weeks    Status  New      PT LONG TERM GOAL #5   Title  Pt will be able to ambulate 45250ft with LRAD and will have no more than 2 freezing episodes and festinating gait <50% of the time to demo improved overall gait in order to decrease her risk for falls.    Time  8    Period  Weeks    Status  New            Plan - 01/31/18 1525    Clinical Impression Statement  This session began with patient performing supine  exercises for lower extremity strengthening. Patient required frequent tactile cueing and verbal cueing to perform these. Patient required assistance with straight leg raise to maintain the knee straight. Then patient performed seated exercises to improve initiation of movement, coordination, and lower extremity strength. Patient performed trunk flexion to sitting upright with arms moving in and spreading out wide to exaggerate proper posture. Then patient performed stepping over a hurdle with each foot. Seated  exercises required frequent demonstration, tactile cueing, and verbal cueing for patient to complete. Finally patient performed gait training with stepping through the agility ladder with cognitive dual tasks and figure-8 through cones with and without cognitive dual task. Patient demonstrated difficulty with maintaining ambulation while performing dual tasks. Patient required increased time with exercises and gait training throughout session. Patient stated she was feeling well throughout.     Rehab Potential  Fair    PT Frequency  2x / week    PT Duration  8 weeks    PT Treatment/Interventions  ADLs/Self Care Home Management;Cryotherapy;Electrical Stimulation;Moist Heat;DME Instruction;Gait training;Stair training;Functional mobility training;Therapeutic activities;Therapeutic exercise;Balance training;Neuromuscular re-education;Patient/family education;Manual techniques;Passive range of motion;Dry needling;Energy conservation;Taping    PT Next Visit Plan  continue dual task + gait, making turns/increasing speed with turns; PWR! step in sitting; Continue strengthening, gait training metronome for cueing, balance training; continue perterbations for stepping strategy, postural strengthening; progress as able and update HEP    PT Home Exercise Plan  eval: bridging, STS; 3/4: supine clams, SLR, SAQ    Consulted and Agree with Plan of Care  Patient;Family member/caregiver    Family Member Consulted  husband       Patient will benefit from skilled therapeutic intervention in order to improve the following deficits and impairments:  Abnormal gait, Decreased activity tolerance, Decreased balance, Decreased endurance, Decreased knowledge of use of DME, Decreased mobility, Decreased strength, Difficulty walking, Impaired flexibility, Improper body mechanics, Postural dysfunction, Pain  Visit Diagnosis: Unsteadiness on feet  History of falling  Muscle weakness (generalized)  Abnormal  posture     Problem List Patient Active Problem List   Diagnosis Date Noted  . Frequency of urination 01/23/2018  . Mild intermittent asthma, uncomplicated 10/31/2017  . Weight loss, unintentional 07/11/2017  . Lightheadedness 06/16/2017  . Chronic bilateral low back pain without sciatica 12/19/2016  . 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 Hypertensive retinopathy of both eyes, grade 2 01/15/2016  . Left leg DVT (HCC) 12/18/2015  . 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 History of colon polyps 12/25/2014  . Health care maintenance 02/13/2014  . Subclinical hypothyroidism 12/25/2013  . Right renal mass 06/11/2012  . Osteoarthritis of both knees 03/15/2012  . Parkinsonism (HCC) 07/13/2010  . ALLERGIC RHINITIS, SEASONAL 02/20/2007  . Hyperlipidemia 09/27/2006  . Essential hypertension 09/27/2006  . GERD 09/27/2006   Verne Carrow PT, DPT 3:33 PM, 01/31/18 [phone]  [facility] [address] [place], [place], [address] Phone: [phone]   Fax:  [phone]  Name: Nicole Baxter MRN: [id number] Date of Birth: [birth year]

## 2018-01-31 NOTE — Telephone Encounter (Signed)
01/31/18  Left a message with patient's daughter to see if she could get in touch with her mom to let her know we have been calling trying to change the 3/7 appt since Barb won't be here.

## 2018-02-01 ENCOUNTER — Ambulatory Visit (HOSPITAL_COMMUNITY): Payer: PPO

## 2018-02-06 ENCOUNTER — Other Ambulatory Visit: Payer: Self-pay

## 2018-02-06 ENCOUNTER — Ambulatory Visit (INDEPENDENT_AMBULATORY_CARE_PROVIDER_SITE_OTHER): Payer: PPO | Admitting: Internal Medicine

## 2018-02-06 ENCOUNTER — Encounter: Payer: Self-pay | Admitting: Internal Medicine

## 2018-02-06 ENCOUNTER — Ambulatory Visit (HOSPITAL_COMMUNITY): Payer: PPO

## 2018-02-06 ENCOUNTER — Ambulatory Visit: Payer: PPO

## 2018-02-06 VITALS — BP 133/78 | HR 63 | Temp 98.1°F | Ht 61.0 in | Wt 125.5 lb

## 2018-02-06 DIAGNOSIS — Z79899 Other long term (current) drug therapy: Secondary | ICD-10-CM | POA: Diagnosis not present

## 2018-02-06 DIAGNOSIS — R634 Abnormal weight loss: Secondary | ICD-10-CM

## 2018-02-06 DIAGNOSIS — Z8669 Personal history of other diseases of the nervous system and sense organs: Secondary | ICD-10-CM

## 2018-02-06 DIAGNOSIS — M170 Bilateral primary osteoarthritis of knee: Secondary | ICD-10-CM

## 2018-02-06 DIAGNOSIS — R42 Dizziness and giddiness: Secondary | ICD-10-CM

## 2018-02-06 DIAGNOSIS — G20 Parkinson's disease: Secondary | ICD-10-CM | POA: Diagnosis not present

## 2018-02-06 DIAGNOSIS — I10 Essential (primary) hypertension: Secondary | ICD-10-CM | POA: Diagnosis not present

## 2018-02-06 DIAGNOSIS — J3489 Other specified disorders of nose and nasal sinuses: Secondary | ICD-10-CM

## 2018-02-06 DIAGNOSIS — F0280 Dementia in other diseases classified elsewhere without behavioral disturbance: Secondary | ICD-10-CM

## 2018-02-06 DIAGNOSIS — E02 Subclinical iodine-deficiency hypothyroidism: Secondary | ICD-10-CM

## 2018-02-06 DIAGNOSIS — E039 Hypothyroidism, unspecified: Secondary | ICD-10-CM

## 2018-02-06 DIAGNOSIS — E038 Other specified hypothyroidism: Secondary | ICD-10-CM

## 2018-02-06 DIAGNOSIS — Z6823 Body mass index (BMI) 23.0-23.9, adult: Secondary | ICD-10-CM | POA: Diagnosis not present

## 2018-02-06 NOTE — Assessment & Plan Note (Signed)
-  This problem is chronic and stable -Patient follows up with neurology -Dr. Lucia GaskinsAhern -She is compliant with Sinemet 25/100 mg and is also taking Exelon 4.5 mg twice daily for associated mild dementia -No further workup at this time

## 2018-02-06 NOTE — Assessment & Plan Note (Signed)
-  This problem has now resolved since we decreased her lisinopril to 20 mg -She denies any current complaints with this -No further workup at this time

## 2018-02-06 NOTE — Progress Notes (Signed)
   Subjective:    Patient ID: Nicole Baxter, female    DOB: 08/15/1941, 77 y.o.   MRN: [id number]  HPI  I have seen and examined this patient.  Patient is here for routine follow-up of her hypertension.  Patient states that she feels well and has no new complaints at this time.  She does occasionally complain of intermittent sinus pressure which is relieved with Tylenol.  Her lightheadedness has resolved. She is compliant with all her medications.  She states that she is eating more and is hydrating well.  Review of Systems  Constitutional: Negative.   HENT: Positive for sinus pressure. Negative for ear discharge, rhinorrhea, sneezing and sore throat.   Respiratory: Negative.   Cardiovascular: Negative.   Gastrointestinal: Negative.   Musculoskeletal: Positive for arthralgias. Negative for joint swelling and myalgias.  Neurological: Negative.  Negative for light-headedness.  Psychiatric/Behavioral: Negative.        Objective:   Physical Exam  Constitutional: She is oriented to person, place, and time. She appears well-developed and well-nourished.  HENT:  Head: Normocephalic and atraumatic.  Mouth/Throat: No oropharyngeal exudate.  Neck: Neck supple.  Cardiovascular: Normal rate, regular rhythm and normal heart sounds.  Pulmonary/Chest: Effort normal and breath sounds normal. No respiratory distress. She has no wheezes.  Abdominal: Soft. Bowel sounds are normal. She exhibits no distension. There is no tenderness.  Musculoskeletal: Normal range of motion. She exhibits no edema.  Lymphadenopathy:    She has no cervical adenopathy.  Neurological: She is alert and oriented to person, place, and time.  Psychiatric: She has a normal mood and affect. Her behavior is normal.          Assessment & Plan:  Please see problem based charting for assessment and plan:

## 2018-02-06 NOTE — Patient Instructions (Signed)
-  It was a pleasure seeing you today -Please follow-up with me in 3 months -Your blood pressure is doing well on your current regimen -Your weight has been stable since  your last visit -Please call me if you have any questions

## 2018-02-06 NOTE — Assessment & Plan Note (Signed)
-  Patient's weight is stable from her last visit -We will continue to monitor her weight closely -She was encouraged to continue with boost/Ensure as well as improving her daily oral intake -Patient states that she is compliant with this and that her appetite has improved -We will recheck on follow-up -No further workup at this time

## 2018-02-06 NOTE — Assessment & Plan Note (Signed)
-  This problem is chronic and stable -Patient denies any symptoms of hypo-or hyperthyroidism -Patient did have a mildly elevated TSH but a normal free T4 on her last blood work -She is not currently on any medications -We will recheck her TSH and free T4 on her follow-up visit

## 2018-02-06 NOTE — Assessment & Plan Note (Signed)
-  This problem is chronic and stable -Patient states that her symptoms are well controlled with Voltaren gel -She denies any new complaints -We will continue with his current regimen for now -No further workup at this time

## 2018-02-06 NOTE — Assessment & Plan Note (Signed)
BP Readings from Last 3 Encounters:  02/06/18 133/78  01/23/18 112/81  01/18/18 94/76    Lab Results  Component Value Date   NA 142 12/08/2017   K 3.5 12/08/2017   CREATININE 0.78 12/08/2017    Assessment: Blood pressure control:  Well-controlled Progress toward BP goal:   At goal Comments: Patient is compliant with lisinopril 20 mg  Plan: Medications:  continue current medications Educational resources provided:   Self management tools provided:   Other plans: We will check BMP on follow-up visit

## 2018-02-08 ENCOUNTER — Ambulatory Visit (HOSPITAL_COMMUNITY): Payer: PPO

## 2018-02-08 ENCOUNTER — Encounter (HOSPITAL_COMMUNITY): Payer: Self-pay

## 2018-02-08 DIAGNOSIS — R2681 Unsteadiness on feet: Secondary | ICD-10-CM

## 2018-02-08 DIAGNOSIS — Z9181 History of falling: Secondary | ICD-10-CM

## 2018-02-08 DIAGNOSIS — R293 Abnormal posture: Secondary | ICD-10-CM

## 2018-02-08 DIAGNOSIS — M6281 Muscle weakness (generalized): Secondary | ICD-10-CM

## 2018-02-08 NOTE — Therapy (Signed)
[facility] [address] [place], [place], [address] Phone: [phone]   Fax:  [phone]  Physical Therapy Treatment  Patient Details  Name: Nicole Baxter MRN: [id number] Date of Birth: 12-01-1940 Referring Provider: Butch Penny, NP   Encounter Date: 02/08/2018  PT End of Session - 02/08/18 0858    Visit Number  13    Number of Visits  17    Date for PT Re-Evaluation  02/21/18    Authorization Type  [employer]    Authorization Time Period  12/27/17 to 02/21/18    PT Start Time  0900    PT Stop Time  0940    PT Time Calculation (min)  40 min    Equipment Utilized During Treatment  Gait belt    Activity Tolerance  Patient tolerated treatment well;Patient limited by fatigue    Behavior During Therapy  [facility] for tasks assessed/performed       Past Medical History:  Diagnosis Date  . Arthritis   . GERD (gastroesophageal reflux disease)    pt. states she no longer has GERD (09/11/15)  . Hyperglycemia    isolated FBS 122, 95, 104 (Random 138)  . Hyperlipidemia    on pravastatin  . Hypertension    controlled on 2 agents  . 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 Obesity   . Parkinson's disease (tremor, stiffness, slow motion, unstable posture) (HCC)   . Shortness of breath dyspnea    pt. states that she no longer has SOB )09/11/15  . Tremor     Past Surgical History:  Procedure Laterality Date  . CESAREAN SECTION    . COLONOSCOPY    . TOTAL KNEE ARTHROPLASTY Left 09/23/2015   Procedure: LEFT TOTAL KNEE ARTHROPLASTY;  Surgeon: Loreta Ave, MD;  Location: [facility] OR;  Service: Orthopedics;  Laterality: Left;    There were no vitals filed for this visit.  Subjective Assessment - 02/08/18 0859    Subjective  Pt states that she is not hitting on much this morning. She states she's been alright the last week.    Currently in Pain?  No/denies           [facility] Adult PT Treatment/Exercise - 02/08/18  0001      Knee/Hip Exercises: Standing   Gait Training  foward gait through ladder with SPC x4RT; x3RT +dual task naming animals    Other Standing Knee Exercises  figure 8 around cones x2RT (last RT with dual task)      Knee/Hip Exercises: Seated   Other Seated Knee/Hip Exercises  sitting on dyna disc with feet elevated off floor: +LAQ x15 each, +marching x15 each, +scap retraction an GH ext with RTB x15 reps each (feet on floor with GH ext d/t c/o dizziness)           PT Education - 02/08/18 0858    Education provided  Yes    Education Details  exercise technique, proper gait with dual task, turning    Person(s) Educated  Patient;Spouse    Methods  Explanation;Demonstration    Comprehension  Verbalized understanding;Returned demonstration       PT Short Term Goals - 01/25/18 0951      PT SHORT TERM GOAL #1   Title  Pt will be independent with HEP and perform consistently in order to maximize strength to improve gait and balance.    Baseline  2/28: reports compliance    Time  4  Period  Weeks    Status  Achieved      PT SHORT TERM GOAL #2   Title  Pt will be mod I with bed mobility in order to improve independence at home and decrease stress on caregiver.     Time  4    Period  Weeks    Status  New      PT SHORT TERM GOAL #3   Title  Pt will have 1/2 grade improvement in MMT in order to maximize gait and balance.    Time  4    Period  Weeks    Status  New      PT SHORT TERM GOAL #4   Title  Pt will score 14/24 or > on the DGI in order to demo improved dynamic gait and balance to decrease risk for falls.    Time  4    Period  Weeks    Status  New        PT Long Term Goals - 01/25/18 1610      PT LONG TERM GOAL #1   Title  Pt will have 1 grade improvement in MMT to maximize transfers, gait, and balance, and decrease risk for falls.    Time  8    Period  Weeks    Status  New      PT LONG TERM GOAL #2   Title  Pt will perform 5xSTS in 15 sec or < with no  UE to demo improved functional strength and decrease risk for falls.    Time  8    Period  Weeks    Status  New      PT LONG TERM GOAL #3   Title  Pt will be able to perform TUG in 15 sec or < with LRAD to demo improved balance and gait to decrease risk for falls and maximize pt's community access.    Time  8    Period  Weeks    Status  New      PT LONG TERM GOAL #4   Title  Pt will score 17/24 or > on DGI to demo improved overall gait and decrease risk for falls.    Time  8    Period  Weeks    Status  New      PT LONG TERM GOAL #5   Title  Pt will be able to ambulate 424ft with LRAD and will have no more than 2 freezing episodes and festinating gait <50% of the time to demo improved overall gait in order to decrease her risk for falls.    Time  8    Period  Weeks    Status  New            Plan - 02/08/18 9604    Clinical Impression Statement  Began with seated exercises on dyna disc for improved postural and core strength. Pt demo'ing good control during LAQ but had increased unsteadiness with marching; she reported intermittent dizziness during these activities but was able to continue the exercises with feet elevated except during GH extension. She became slightly emotional stating "I can't do this, it's making me dizzy" so PT lowered table back down so that pt's feet were on the floor and she was able to complete the activity this way. Rest of session focused gait training. Cues for proper SPC still needed but pt able to demo better usage of cane during gait. Continued difficulties with dual tasking and  turns with gait as she had increased unsteadiness, minor LOBs, and difficulty maintaining larger step lengths. Increased festination and freezing noted with dual tasking and gait. Continue as planned, progressing as able.    Rehab Potential  Fair    PT Frequency  2x / week    PT Duration  8 weeks    PT Treatment/Interventions  ADLs/Self Care Home  Management;Cryotherapy;Electrical Stimulation;Moist Heat;DME Instruction;Gait training;Stair training;Functional mobility training;Therapeutic activities;Therapeutic exercise;Balance training;Neuromuscular re-education;Patient/family education;Manual techniques;Passive range of motion;Dry needling;Energy conservation;Taping    PT Next Visit Plan  continue dual task + gait, making turns/increasing speed with turns; PWR! step in sitting; Continue strengthening, gait training metronome for cueing, balance training; progress as able and update HEP    PT Home Exercise Plan  eval: bridging, STS; 3/4: supine clams, SLR, SAQ    Consulted and Agree with Plan of Care  Patient;Family member/caregiver    Family Member Consulted  husband       Patient will benefit from skilled therapeutic intervention in order to improve the following deficits and impairments:  Abnormal gait, Decreased activity tolerance, Decreased balance, Decreased endurance, Decreased knowledge of use of DME, Decreased mobility, Decreased strength, Difficulty walking, Impaired flexibility, Improper body mechanics, Postural dysfunction, Pain  Visit Diagnosis: Unsteadiness on feet  History of falling  Muscle weakness (generalized)  Abnormal posture     Problem List Patient Active Problem List   Diagnosis Date Noted  . Mild intermittent asthma, uncomplicated 10/31/2017  . Weight loss, unintentional 07/11/2017  . Lightheadedness 06/16/2017  . Chronic bilateral low back pain without sciatica 12/19/2016  . 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 Hypertensive retinopathy of both eyes, grade 2 01/15/2016  . 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 History of colon polyps 12/25/2014  . Health care maintenance 02/13/2014  . Subclinical hypothyroidism 12/25/2013  . Right renal mass 06/11/2012  . Osteoarthritis of both knees 03/15/2012  . Parkinsonism (HCC) 07/13/2010  . ALLERGIC RHINITIS, SEASONAL  02/20/2007  . Hyperlipidemia 09/27/2006  . Essential hypertension 09/27/2006  . GERD 09/27/2006       Jac CanavanBrooke Powell PT, DPT  [facility] [address] [place], [place], [id number] Phone: [phone]   Fax:  [phone]  Name: [name] MRN: [id number] Date of Birth: 10/12/1941

## 2018-02-12 ENCOUNTER — Ambulatory Visit (HOSPITAL_COMMUNITY): Payer: PPO

## 2018-02-15 ENCOUNTER — Ambulatory Visit (HOSPITAL_COMMUNITY): Payer: PPO

## 2018-02-15 ENCOUNTER — Encounter (HOSPITAL_COMMUNITY): Payer: Self-pay

## 2018-02-15 DIAGNOSIS — Z9181 History of falling: Secondary | ICD-10-CM

## 2018-02-15 DIAGNOSIS — R2681 Unsteadiness on feet: Secondary | ICD-10-CM

## 2018-02-15 DIAGNOSIS — R293 Abnormal posture: Secondary | ICD-10-CM

## 2018-02-15 DIAGNOSIS — M6281 Muscle weakness (generalized): Secondary | ICD-10-CM

## 2018-02-15 NOTE — Patient Instructions (Signed)
  HIP ABDUCTION - STANDING   While standing, raise your leg out to the side. Keep your knee straight and maintain your toes pointed forward the entire time.    Use your arms for support if needed for balance and safety.   Perform 1x/day, 2-3 sets of 10-15 reps on each leg   STANDING MARCHING  While standing, draw up your knee, set it down and then alternate to your other side.  Use your arms for support if needed for balance and safety.   Perform 1x/day, 2-3 sets of 10-15 reps on each leg

## 2018-02-15 NOTE — Therapy (Signed)
[facility] [address] [place], [place], [address] Phone: [phone]   Fax:  [phone]  Physical Therapy Treatment  Patient Details  Name: Nicole Baxter MRN: [id number] Date of Birth: 12-Jan-1941 Referring Provider: Butch Penny, NP   Encounter Date: 02/15/2018  PT End of Session - 02/15/18 0821    Visit Number  14    Number of Visits  17    Date for PT Re-Evaluation  02/21/18    Authorization Type  [employer]    Authorization Time Period  12/27/17 to 02/21/18    PT Start Time  0815    PT Stop Time  0856    PT Time Calculation (min)  41 min    Equipment Utilized During Treatment  Gait belt    Activity Tolerance  Patient tolerated treatment well;Patient limited by fatigue    Behavior During Therapy  [facility] for tasks assessed/performed       Past Medical History:  Diagnosis Date  . Arthritis   . GERD (gastroesophageal reflux disease)    pt. states she no longer has GERD (09/11/15)  . Hyperglycemia    isolated FBS 122, 95, 104 (Random 138)  . Hyperlipidemia    on pravastatin  . Hypertension    controlled on 2 agents  . 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 Obesity   . Parkinson's disease (tremor, stiffness, slow motion, unstable posture) (HCC)   . Shortness of breath dyspnea    pt. states that she no longer has SOB )09/11/15  . Tremor     Past Surgical History:  Procedure Laterality Date  . CESAREAN SECTION    . COLONOSCOPY    . TOTAL KNEE ARTHROPLASTY Left 09/23/2015   Procedure: LEFT TOTAL KNEE ARTHROPLASTY;  Surgeon: Loreta Ave, MD;  Location: [facility] OR;  Service: Orthopedics;  Laterality: Left;    There were no vitals filed for this visit.  Subjective Assessment - 02/15/18 0822    Subjective  Pt reports that she's okay this morning. She did not want to wake up this morning.     Currently in Pain?  No/denies           [facility] Adult PT Treatment/Exercise - 02/15/18 0001       Knee/Hip Exercises: Aerobic   Nustep  x5 mins, beginning of session, L3, maintaining SPM >35 to improve gait speed, gait sequencing      Knee/Hip Exercises: Standing   Hip Flexion  Both;10 reps    Hip Abduction  Both;10 reps    Gait Training  fwd and sidestepping gait thru agility ladder +dual task x1RT each       PWR [facility]) - 02/15/18 1324    PWR! exercises  Moves in sitting    PWR! Rock  x5 bil    PWR! Twist  x1RT with 5 cones (cone rotation in sitting)           PT Education - 02/15/18 0857    Education provided  Yes    Education Details  gait sequencing, dual task with gait; HEP    Person(s) Educated  Patient;Spouse    Methods  Explanation;Demonstration;Handout    Comprehension  Verbalized understanding;Returned demonstration;Need further instruction       PT Short Term Goals - 01/25/18 0951      PT SHORT TERM GOAL #1   Title  Pt will be independent with HEP and perform consistently in order to maximize strength to improve gait and  balance.    Baseline  2/28: reports compliance    Time  4    Period  Weeks    Status  Achieved      PT SHORT TERM GOAL #2   Title  Pt will be mod I with bed mobility in order to improve independence at home and decrease stress on caregiver.     Time  4    Period  Weeks    Status  New      PT SHORT TERM GOAL #3   Title  Pt will have 1/2 grade improvement in MMT in order to maximize gait and balance.    Time  4    Period  Weeks    Status  New      PT SHORT TERM GOAL #4   Title  Pt will score 14/24 or > on the DGI in order to demo improved dynamic gait and balance to decrease risk for falls.    Time  4    Period  Weeks    Status  New        PT Long Term Goals - 01/25/18 [id number]      PT LONG TERM GOAL #1   Title  Pt will have 1 grade improvement in MMT to maximize transfers, gait, and balance, and decrease risk for falls.    Time  8    Period  Weeks    Status  New      PT LONG TERM GOAL #2   Title  Pt will perform 5xSTS in  15 sec or < with no UE to demo improved functional strength and decrease risk for falls.    Time  8    Period  Weeks    Status  New      PT LONG TERM GOAL #3   Title  Pt will be able to perform TUG in 15 sec or < with LRAD to demo improved balance and gait to decrease risk for falls and maximize pt's community access.    Time  8    Period  Weeks    Status  New      PT LONG TERM GOAL #4   Title  Pt will score 17/24 or > on DGI to demo improved overall gait and decrease risk for falls.    Time  8    Period  Weeks    Status  New      PT LONG TERM GOAL #5   Title  Pt will be able to ambulate 43750ft with LRAD and will have no more than 2 freezing episodes and festinating gait <50% of the time to demo improved overall gait in order to decrease her risk for falls.    Time  8    Period  Weeks    Status  New            Plan - 02/15/18 0857    Clinical Impression Statement  Resumed Nustep this date at beginning of session for improved gait speed and sequencing. Performed PWR! [address] [employer] Twist in sitting this date. Pt with difficulty with both due to increased tightness and slowed movement pattern. Continued with gait training and dual task during fwd ambulation and side stepping through agility ladder. Pt continues to demo decreasing gait speed and increasing unsteadiness with dual task activities. Added standing hip abd and march in standing to HEP.     Rehab Potential  Fair    PT Frequency  2x / week  PT Duration  8 weeks    PT Treatment/Interventions  ADLs/Self Care Home Management;Cryotherapy;Electrical Stimulation;Moist Heat;DME Instruction;Gait training;Stair training;Functional mobility training;Therapeutic activities;Therapeutic exercise;Balance training;Neuromuscular re-education;Patient/family education;Manual techniques;Passive range of motion;Dry needling;Energy conservation;Taping    PT Next Visit Plan  continue dual task + gait, making turns/increasing speed with turns;  PWR! step in sitting; Continue strengthening, gait training metronome for cueing, balance training; progress as able and update HEP    PT Home Exercise Plan  eval: bridging, STS; 3/4: supine clams, SLR, SAQ; 3/21: standing abd, standing march    Consulted and Agree with Plan of Care  Patient;Family member/caregiver    Family Member Consulted  husband       Patient will benefit from skilled therapeutic intervention in order to improve the following deficits and impairments:  Abnormal gait, Decreased activity tolerance, Decreased balance, Decreased endurance, Decreased knowledge of use of DME, Decreased mobility, Decreased strength, Difficulty walking, Impaired flexibility, Improper body mechanics, Postural dysfunction, Pain  Visit Diagnosis: Unsteadiness on feet  History of falling  Muscle weakness (generalized)  Abnormal posture     Problem List Patient Active Problem List   Diagnosis Date Noted  . Mild intermittent asthma, uncomplicated 10/31/2017  . Weight loss, unintentional 07/11/2017  . Lightheadedness 06/16/2017  . Chronic bilateral low back pain without sciatica 12/19/2016  . 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 Hypertensive retinopathy of both eyes, grade 2 01/15/2016  . 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 History of colon polyps 12/25/2014  . Health care maintenance 02/13/2014  . Subclinical hypothyroidism 12/25/2013  . Right renal mass 06/11/2012  . Osteoarthritis of both knees 03/15/2012  . Parkinsonism (HCC) 07/13/2010  . ALLERGIC RHINITIS, SEASONAL 02/20/2007  . Hyperlipidemia 09/27/2006  . Essential hypertension 09/27/2006  . GERD 09/27/2006        Jac Canavan PT, DPT  [facility] [address] [place], [place], [address] Phone: [phone]   Fax:  [phone]  Name: Nicole Baxter MRN: [id number] Date of Birth: January 24, 1941

## 2018-02-19 ENCOUNTER — Encounter (HOSPITAL_COMMUNITY): Payer: Self-pay

## 2018-02-19 ENCOUNTER — Ambulatory Visit (HOSPITAL_COMMUNITY): Payer: PPO

## 2018-02-19 DIAGNOSIS — R293 Abnormal posture: Secondary | ICD-10-CM

## 2018-02-19 DIAGNOSIS — R2681 Unsteadiness on feet: Secondary | ICD-10-CM

## 2018-02-19 DIAGNOSIS — Z9181 History of falling: Secondary | ICD-10-CM

## 2018-02-19 DIAGNOSIS — M6281 Muscle weakness (generalized): Secondary | ICD-10-CM

## 2018-02-19 NOTE — Therapy (Signed)
[facility] [address] [place], [place], [address] Phone: [phone]   Fax:  [phone]  Physical Therapy Treatment  Patient Details  Name: Nicole NgoRuby L Baxter MRN: [id number] Date of Birth: 06/06/1941 Referring Provider: Butch PennyMegan Millikan, NP   Encounter Date: 02/19/2018  PT End of Session - 02/19/18 0810    Visit Number  15    Number of Visits  17    Date for PT Re-Evaluation  02/22/18    Authorization Type  [employer]    Authorization Time Period  12/27/17 to 7/25/363/27/19; new cert sent 6/44/033/25/19 to cover visit on Thursday, 02/22/18    PT Start Time  0815    PT Stop Time  0855    PT Time Calculation (min)  40 min    Equipment Utilized During Treatment  Gait belt    Activity Tolerance  Patient tolerated treatment well;Patient limited by fatigue    Behavior During Therapy  [facility] for tasks assessed/performed       Past Medical History:  Diagnosis Date  . Arthritis   . GERD (gastroesophageal reflux disease)    pt. states she no longer has GERD (09/11/15)  . Hyperglycemia    isolated FBS 122, 95, 104 (Random 138)  . Hyperlipidemia    on pravastatin  . Hypertension    controlled on 2 agents  . 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 Obesity   . Parkinson's disease (tremor, stiffness, slow motion, unstable posture) (HCC)   . Shortness of breath dyspnea    pt. states that she no longer has SOB )09/11/15  . Tremor     Past Surgical History:  Procedure Laterality Date  . CESAREAN SECTION    . COLONOSCOPY    . TOTAL KNEE ARTHROPLASTY Left 09/23/2015   Procedure: LEFT TOTAL KNEE ARTHROPLASTY;  Surgeon: Loreta Aveaniel F Murphy, MD;  [facility];  Service: Orthopedics;  Laterality: Left;    There were no vitals filed for this visit.  Subjective Assessment - 02/19/18 0811    Subjective  Pt reports she had a good weekend. She spent all day at [employer] yesterday. She inquired about when her last appointment was and stated she  wanted her next one to be her last one.    Currently in Pain?  No/denies                No data recorded       [facility] Adult PT Treatment/Exercise - 02/19/18 0001      Knee/Hip Exercises: Aerobic   Nustep  x5 mins, beginning of session, L3, maintaining SPM >35 to improve gait speed, gait sequencing      Knee/Hip Exercises: Standing   Gait Training  fwd and sidestepping gait thru agility ladder +dual task x1RT each    Other Standing Knee Exercises  figure 8 around cones x2RT + dual task (cues for proper step length)    Other Standing Knee Exercises  lateral step over 6" hurdle with BUE support focusing on increased step length x10 reps        [facility]WR [facility] - 02/19/18 0827    PWR! Rock  x5 bil    PWR! Twist  x1RT with 5 cones (cone rotation in sitting)          PT Education - 02/19/18 0811    Education provided  Yes    Education Details  gait, exercise technique    Person(s) Educated  Patient    Methods  Explanation;Demonstration    Comprehension  Verbalized understanding;Returned demonstration;Need further instruction       PT Short Term Goals - 01/25/18 0951      PT SHORT TERM GOAL #1   Title  Pt will be independent with HEP and perform consistently in order to maximize strength to improve gait and balance.    Baseline  2/28: reports compliance    Time  4    Period  Weeks    Status  Achieved      PT SHORT TERM GOAL #2   Title  Pt will be mod I with bed mobility in order to improve independence at home and decrease stress on caregiver.     Time  4    Period  Weeks    Status  New      PT SHORT TERM GOAL #3   Title  Pt will have 1/2 grade improvement in MMT in order to maximize gait and balance.    Time  4    Period  Weeks    Status  New      PT SHORT TERM GOAL #4   Title  Pt will score 14/24 or > on the DGI in order to demo improved dynamic gait and balance to decrease risk for falls.    Time  4    Period  Weeks    Status  New        PT Long  Term Goals - 01/25/18 7829      PT LONG TERM GOAL #1   Title  Pt will have 1 grade improvement in MMT to maximize transfers, gait, and balance, and decrease risk for falls.    Time  8    Period  Weeks    Status  New      PT LONG TERM GOAL #2   Title  Pt will perform 5xSTS in 15 sec or < with no UE to demo improved functional strength and decrease risk for falls.    Time  8    Period  Weeks    Status  New      PT LONG TERM GOAL #3   Title  Pt will be able to perform TUG in 15 sec or < with LRAD to demo improved balance and gait to decrease risk for falls and maximize pt's community access.    Time  8    Period  Weeks    Status  New      PT LONG TERM GOAL #4   Title  Pt will score 17/24 or > on DGI to demo improved overall gait and decrease risk for falls.    Time  8    Period  Weeks    Status  New      PT LONG TERM GOAL #5   Title  Pt will be able to ambulate 410ft with LRAD and will have no more than 2 freezing episodes and festinating gait <50% of the time to demo improved overall gait in order to decrease her risk for falls.    Time  8    Period  Weeks    Status  New            Plan - 02/19/18 5621    Clinical Impression Statement  Continued with established POC focusing on gait speed, gait sequencing, and dual task with gait along with generalized strengthening and improved speed with functional movements. She continues to require cues for proper exercise technique and she still has  increased gait and balance deficits with dual tasks. She demo's slowed cadence and increased unsteadiness during dual tasking. She required min guard-min A during fwd ambulation +dual task due to LOB. Pt due for reassessment next visit and wishes to be discharged at that time.     Rehab Potential  Fair    PT Frequency  2x / week    PT Duration  8 weeks    PT Treatment/Interventions  ADLs/Self Care Home Management;Cryotherapy;Electrical Stimulation;Moist Heat;DME Instruction;Gait  training;Stair training;Functional mobility training;Therapeutic activities;Therapeutic exercise;Balance training;Neuromuscular re-education;Patient/family education;Manual techniques;Passive range of motion;Dry needling;Energy conservation;Taping    PT Next Visit Plan  reassess    PT Home Exercise Plan  eval: bridging, STS; 3/4: supine clams, SLR, SAQ; 3/21: standing abd, standing march    Consulted and Agree with Plan of Care  Patient;Family member/caregiver    Family Member Consulted  husband       Patient will benefit from skilled therapeutic intervention in order to improve the following deficits and impairments:  Abnormal gait, Decreased activity tolerance, Decreased balance, Decreased endurance, Decreased knowledge of use of DME, Decreased mobility, Decreased strength, Difficulty walking, Impaired flexibility, Improper body mechanics, Postural dysfunction, Pain  Visit Diagnosis: Unsteadiness on feet - Plan: PT plan of care cert/re-cert  History of falling - Plan: PT plan of care cert/re-cert  Muscle weakness (generalized) - Plan: PT plan of care cert/re-cert  Abnormal posture - Plan: PT plan of care cert/re-cert     Problem List Patient Active Problem List   Diagnosis Date Noted  . Mild intermittent asthma, uncomplicated 10/31/2017  . Weight loss, unintentional 07/11/2017  . Lightheadedness 06/16/2017  . Chronic bilateral low back pain without sciatica 12/19/2016  . 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 Hypertensive retinopathy of both eyes, grade 2 01/15/2016  . 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 History of colon polyps 12/25/2014  . Health care maintenance 02/13/2014  . Subclinical hypothyroidism 12/25/2013  . Right renal mass 06/11/2012  . Osteoarthritis of both knees 03/15/2012  . Parkinsonism (HCC) 07/13/2010  . ALLERGIC RHINITIS, SEASONAL 02/20/2007  . Hyperlipidemia 09/27/2006  . Essential hypertension 09/27/2006  .  GERD 09/27/2006       Jac Canavan PT, DPT  [facility] [address] [place], [place], [address] Phone: [phone]   Fax:  [phone]  Name: Nicole Baxter MRN: [id number] Date of Birth: March 04, 1941

## 2018-02-22 ENCOUNTER — Encounter (HOSPITAL_COMMUNITY): Payer: Self-pay

## 2018-02-22 ENCOUNTER — Ambulatory Visit (HOSPITAL_COMMUNITY): Payer: PPO

## 2018-02-22 DIAGNOSIS — Z9181 History of falling: Secondary | ICD-10-CM

## 2018-02-22 DIAGNOSIS — R2681 Unsteadiness on feet: Secondary | ICD-10-CM

## 2018-02-22 DIAGNOSIS — M6281 Muscle weakness (generalized): Secondary | ICD-10-CM

## 2018-02-22 DIAGNOSIS — R293 Abnormal posture: Secondary | ICD-10-CM

## 2018-02-22 NOTE — Therapy (Signed)
[place], [place], [address] Phone: [phone]   Fax:  [phone]  Physical Therapy Treatment/Discharge summary  Patient Details  Name: Nicole Baxter MRN: [id number] Date of Birth: 05-13-1941 Referring Provider: Ward Givens, NP   Encounter Date: 02/22/2018  PT End of Session - 02/22/18 0821    Visit Number  16    Number of Visits  17    Date for PT Re-Evaluation  02/22/18    Authorization Type  [employer]    Authorization Time Period  12/27/17 to 0/26/37; new cert sent 8/58/85 to cover visit on Thursday, 02/22/18    PT Start Time  0815    PT Stop Time  0850    PT Time Calculation (min)  35 min    Equipment Utilized During Treatment  Gait belt    Activity Tolerance  Patient tolerated treatment well;Patient limited by fatigue    Behavior During Therapy  [facility] for tasks assessed/performed       Past Medical History:  Diagnosis Date  . Arthritis   . GERD (gastroesophageal reflux disease)    pt. states she no longer has GERD (09/11/15)  . Hyperglycemia    isolated FBS 122, 95, 104 (Random 138)  . Hyperlipidemia    on pravastatin  . Hypertension    controlled on 2 agents  . 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 Obesity   . Parkinson's disease (tremor, stiffness, slow motion, unstable posture) ([place])   . Shortness of breath dyspnea    pt. states that she no longer has SOB )09/11/15  . Tremor     Past Surgical History:  Procedure Laterality Date  . CESAREAN SECTION    . COLONOSCOPY    . TOTAL KNEE ARTHROPLASTY Left 09/23/2015   Procedure: LEFT TOTAL KNEE ARTHROPLASTY;  Surgeon: Ninetta Lights, MD;  Location: [place];  Service: Orthopedics;  Laterality: Left;    There were no vitals filed for this visit.      [facility] PT Assessment - 02/22/18 0001      Assessment   Medical Diagnosis  Parkinson's Disease, gait and balance    Referring Provider  Ward Givens, NP    Next MD  Visit  June 2019      Strength   Right Hip Flexion  5/5 was4+    Right Hip Extension  3-/5 was 3-    Right Hip ABduction  4/5 was 3-    Left Hip Flexion  4+/5 was 4+    Left Hip Extension  3+/5 was 3-    Left Hip ABduction  4-/5 was 3-      Bed Mobility   Bed Mobility  Sit to Supine;Supine to Sit    Supine to Sit  6: Modified independent (Device/Increase time)    Sit to Supine  6: Modified independent (Device/Increase time)      Ambulation/Gait   Ambulation/Gait  Yes    Ambulation/Gait Assistance  5: Supervision    Ambulation Distance (Feet)  450 Feet    Assistive device  Straight cane    Gait Comments  0 freezing episodes, festinating gait <50% of the time; festinating gait tended to increase when pt talking/dual tasking      Standardized Balance Assessment   Standardized Balance Assessment  Five Times Sit to Stand;Timed Up and Go Test;Dynamic Gait Index    Five times sit to stand comments   22.5sec, no UE  Dynamic Gait Index   Level Surface  Mild Impairment    Change in Gait Speed  Moderate Impairment    Gait with Horizontal Head Turns  Moderate Impairment    Gait with Vertical Head Turns  Moderate Impairment    Gait and Pivot Turn  Mild Impairment    Step Over Obstacle  Normal    Step Around Obstacles  Mild Impairment    Steps  Moderate Impairment    Total Score  13    DGI comment:  was 11/24      Timed Up and Go Test   TUG  Normal TUG    Normal TUG (seconds)  17 was 32.5sec with SPC           PT Short Term Goals - 02/22/18 9233      PT SHORT TERM GOAL #1   Title  Pt will be independent with HEP and perform consistently in order to maximize strength to improve gait and balance.    Baseline  2/28: reports compliance    Time  4    Period  Weeks    Status  Achieved      PT SHORT TERM GOAL #2   Title  Pt will be mod I with bed mobility in order to improve independence at home and decrease stress on caregiver.     Time  4    Period  Weeks    Status   Achieved      PT SHORT TERM GOAL #3   Title  Pt will have 1/2 grade improvement in MMT in order to maximize gait and balance.    Baseline  3/28: see MMT    Time  4    Period  Weeks    Status  Partially Met      PT SHORT TERM GOAL #4   Title  Pt will score 14/24 or > on the DGI in order to demo improved dynamic gait and balance to decrease risk for falls.    Baseline  3/28: 13/24    Time  4    Period  Weeks    Status  On-going        PT Long Term Goals - 02/22/18 0076      PT LONG TERM GOAL #1   Title  Pt will have 1 grade improvement in MMT to maximize transfers, gait, and balance, and decrease risk for falls.    Baseline  3/28: see MMT    Time  8    Period  Weeks    Status  Partially Met      PT LONG TERM GOAL #2   Title  Pt will perform 5xSTS in 15 sec or < with no UE to demo improved functional strength and decrease risk for falls.    Baseline  3/28: 22.5sec    Time  8    Period  Weeks    Status  On-going      PT LONG TERM GOAL #3   Title  Pt will be able to perform TUG in 15 sec or < with LRAD to demo improved balance and gait to decrease risk for falls and maximize pt's community access.    Baseline  3/28: 17sec SPC    Time  8    Period  Weeks    Status  On-going      PT LONG TERM GOAL #4   Title  Pt will score 17/24 or > on DGI to demo improved overall gait and  decrease risk for falls.    Baseline  3/28: 13/24    Time  8    Period  Weeks    Status  New      PT LONG TERM GOAL #5   Title  Pt will be able to ambulate 446f with LRAD and will have no more than 2 freezing episodes and festinating gait <50% of the time to demo improved overall gait in order to decrease her risk for falls.    Baseline  3/28: festinating <50% of the time, 0 freezing episodes    Time  8    Period  Weeks    Status  Achieved            Plan - 02/22/18 01610   Clinical Impression Statement  PT reassessed pt's goals and outcome measures this date. Pt has made great progress  towards all goals as illustrated above. Her MMT has improved greatly since her last reassessment, her TUG and 5xSTS times improved, though slightly deficient from reaching her goal times, and though she is still at risk for falls, her DGI did improve overall by 3 points to 13/24. She demo'd 0 freezing episodes and <50% of the time demo'd festinating gait during ambulation today; a significant improvement from her eval. Her festinating gait tended to worsen with talking/dual tasking. Pt asking to be discharged today so PT updated her HEP to include PWR! Sitting and educated her and her spouse on how to perform. Pt educated that she can return with referral if significant decline in function occurs.    Rehab Potential  Fair    PT Frequency  2x / week    PT Duration  8 weeks    PT Treatment/Interventions  ADLs/Self Care Home Management;Cryotherapy;Electrical Stimulation;Moist Heat;DME Instruction;Gait training;Stair training;Functional mobility training;Therapeutic activities;Therapeutic exercise;Balance training;Neuromuscular re-education;Patient/family education;Manual techniques;Passive range of motion;Dry needling;Energy conservation;Taping    PT Next Visit Plan  discharged    PT Home Exercise Plan  eval: bridging, STS; 3/4: supine clams, SLR, SAQ; 3/21: standing abd, standing march; 3/28: seated PWR!    Consulted and Agree with Plan of Care  Patient;Family member/caregiver    Family Member Consulted  husband       Patient will benefit from skilled therapeutic intervention in order to improve the following deficits and impairments:  Abnormal gait, Decreased activity tolerance, Decreased balance, Decreased endurance, Decreased knowledge of use of DME, Decreased mobility, Decreased strength, Difficulty walking, Impaired flexibility, Improper body mechanics, Postural dysfunction, Pain  Visit Diagnosis: Unsteadiness on feet  History of falling  Muscle weakness (generalized)  Abnormal  posture     Problem List Patient Active Problem List   Diagnosis Date Noted  . Mild intermittent asthma, uncomplicated 196/02/5408 . Weight loss, unintentional 07/11/2017  . Lightheadedness 06/16/2017  . Chronic bilateral low back pain without sciatica 12/19/2016  . Dementia in Parkinson's disease ([place] 10/08/2016  . Hypertensive retinopathy of both eyes, grade 2 01/15/2016  . 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 History of colon polyps 12/25/2014  . Health care maintenance 02/13/2014  . Subclinical hypothyroidism 12/25/2013  . Right renal mass 06/11/2012  . Osteoarthritis of both knees 03/15/2012  . Parkinsonism ([place] 07/13/2010  . ALLERGIC RHINITIS, SEASONAL 02/20/2007  . Hyperlipidemia 09/27/2006  . Essential hypertension 09/27/2006  . GERD 09/27/2006     PHYSICAL THERAPY DISCHARGE SUMMARY  Visits from Start of Care: 16  Current functional level related to goals / functional outcomes: See above   Remaining deficits: See above  Education / Equipment: HEP Plan: Patient agrees to discharge.  Patient goals were partially met. Patient is being discharged due to the patient's request.  ?????       Geraldine Solar PT, [place] [address] [place], [place], [address] Phone: [phone]   Fax:  [phone]  Name: Nicole Baxter MRN: [id number] Date of Birth: May 10, 1941

## 2018-02-23 ENCOUNTER — Telehealth (HOSPITAL_COMMUNITY): Payer: Self-pay | Admitting: Internal Medicine

## 2018-03-02 ENCOUNTER — Encounter: Payer: Self-pay | Admitting: Internal Medicine

## 2018-03-02 ENCOUNTER — Other Ambulatory Visit: Payer: Self-pay

## 2018-03-02 ENCOUNTER — Ambulatory Visit (INDEPENDENT_AMBULATORY_CARE_PROVIDER_SITE_OTHER): Payer: PPO | Admitting: Internal Medicine

## 2018-03-02 VITALS — BP 135/74 | HR 66 | Temp 98.0°F | Ht 61.0 in | Wt 124.2 lb

## 2018-03-02 DIAGNOSIS — E02 Subclinical iodine-deficiency hypothyroidism: Secondary | ICD-10-CM | POA: Diagnosis not present

## 2018-03-02 DIAGNOSIS — G20 Parkinson's disease: Secondary | ICD-10-CM | POA: Diagnosis not present

## 2018-03-02 DIAGNOSIS — R4189 Other symptoms and signs involving cognitive functions and awareness: Secondary | ICD-10-CM

## 2018-03-02 DIAGNOSIS — Z8709 Personal history of other diseases of the respiratory system: Secondary | ICD-10-CM | POA: Diagnosis not present

## 2018-03-02 DIAGNOSIS — Z79899 Other long term (current) drug therapy: Secondary | ICD-10-CM

## 2018-03-02 DIAGNOSIS — I10 Essential (primary) hypertension: Secondary | ICD-10-CM | POA: Diagnosis not present

## 2018-03-02 NOTE — Assessment & Plan Note (Signed)
Assessment: [name] is here with brief episode of feeling "off and sluggish" while at her husbands eye appointment which resolved within several minutes. Symptoms improved after having juice and crackers but her BP was transiently elevated (at least diastolic). She denies any changes in medications or diet but did not eat breakfast this morning. She denies any recent illness or symptoms suggesting acute illness.   Plan: I'm not exactly sure what caused this brief period of feeling sluggish, but it seemed to resolve after having juice and crackers. She notes similar history when she feels that she needs to eat. Patient feels that she is drinking and eating enough, but daughter suggests she could have more fluid in her diet. Her CBG was normal and I doubt transient hypoglycemia as the etiology. As symptoms have resolved, I've asked the family to take note of any further episodes and discuss this with her PCP at their visit next month.

## 2018-03-02 NOTE — Progress Notes (Signed)
   CC: Transient weakness  HPI:  [name] is a 77 y.o. F with Parkinsons disease, HTN, subclinical hypothyroidism, and history of asthma who presents today for evaluation of transient weakness.   [name] was accompanying her husband at his ophthalmology appointment today when she mentioned feeling "off and sluggish" She related this to not eating breakfast and felt better after having some juice and crackers. Her BP was checked by office staff and she was told "both numbers were above 100" but cannot remember any further specifics. Her CBG was checked shortly after eating and was >100. Has had some similar episodes in the past when she doesn't eat breakfast. She denies any fevers, chills, myalgias, cough, chest pain, SOB, abdominal pain, diarrhea, dysuria or increased frequency. She admits to good PO intake over the past week and has been compliant with her medications. Her daughter brought her in for evaluation today to make sure her BP had improved.   Past Medical History:  Diagnosis Date  . Arthritis   . GERD (gastroesophageal reflux disease)    pt. states she no longer has GERD (09/11/15)  . Hyperglycemia    isolated FBS 122, 95, 104 (Random 138)  . Hyperlipidemia    on pravastatin  . Hypertension    controlled on 2 agents  . 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 Obesity   . Parkinson's disease (tremor, stiffness, slow motion, unstable posture) ([facility])   . Shortness of breath dyspnea    pt. states that she no longer has SOB )09/11/15  . Tremor    Review of Systems:   General: Denies fevers, chills, recent falls, recent illness. HEENT: Denies acute changes in vision, sore throat, dysphagia Cardiac: Denies CP, SOB, palpitations Pulmonary: Denies cough, wheezing Abd: Denies abdominal pain, changes in bowels, no hematochezia or melena Extremities: Denies focal weakness, no swelling.  Physical Exam: General: Alert, in no acute distress. Pleasant and  conversant but slow to respond to questions.  HEENT: No icterus, injection or ptosis. No hoarseness or dysarthria. Oropharynx clear with moist mucous membranes. No pharyngeal erythema or exudate.  Cardiac: RRR, no MGR appreciated Pulmonary: CTA BL with normal WOB on RA. Able to speak in complete sentences Abd: Soft, non-tender. +bs Extremities: Warm, perfused. No significant pedal edema.   Vitals:   03/02/18 1330  BP: 135/74  Pulse: 66  Temp: 98 F (36.7 C)  TempSrc: Oral  SpO2: 99%  Weight: 124 lb 3.2 oz (56.3 kg)  Height: 5\' 1"  (1.549 m)   Body mass index is 23.47 kg/m.  Assessment & Plan:   See Encounters Tab for problem based charting.  Patient discussed with Dr. Cleda DaubE. Hoffman

## 2018-03-02 NOTE — Assessment & Plan Note (Signed)
Assessment: She is compliant with Sinemet 25/100mg  (#6 pills daily) and also Exelon 4.5mg  BID. She follows with Dr. Lucia GaskinsAhern of neurology. Family and patient feel her condition has been relatively stable over the past several months. PD can be associated with autonomic instability, which could explain her fluctuation in BP.   Plan: Continue current regimen prescribed by neurology. She is currently on 6 pills of Sinemet daily, max appears to be 8, but I do not believe she requires any dose adjustments today. They have follow-up already scheduled with neurology.

## 2018-03-02 NOTE — Patient Instructions (Signed)
It was great meeting you all today. I'm sorry you felt a little off this morning, but I'm glad it got better when you ate a snack.   Most likely, you were feeling weak either from not eating or it could be related to your Parkinsons Disease. It is not unusual to have variable blood pressures, and sometimes an elevated blood pressure can make you feel "off" and give you a headache.   Please continue taking your medications as you are and please keep your follow-up appointment with Dr. Heide SparkNarendra next month.

## 2018-03-02 NOTE — Assessment & Plan Note (Signed)
Assessment: Family reports both systolic and diastolic BP >100 while at her husbands ophthalmologist appointment. She felt a little "off" but quickly felt better after having a snack. Family notes compliance with medications and have not noticed anything unusual about the patient recently. Chart review shows occasional diastolic BP in the 90's. She could have some degree of autonomic instability from her Parkinsons disease which would explain an acute fluctuation in BP, but it is also possible that it was an inaccurate reading. I doubt condition like pheochromocytoma given lack of flushing, diaphoresis, headache or history of very elevated BP.  Plan: Patient normotensive here and asymptomatic. Will continue current management with Lisinopril. Family will monitor BP at home should she become symptomatic and will discuss with Dr. Heide SparkNarendra at their visit in a few weeks.

## 2018-03-05 NOTE — Progress Notes (Signed)
Internal Medicine Clinic Attending  Case discussed with Dr. Molt at the time of the visit.  We reviewed the resident's history and exam and pertinent patient test results.  I agree with the assessment, diagnosis, and plan of care documented in the resident's note. 

## 2018-04-03 ENCOUNTER — Encounter: Payer: PPO | Admitting: Internal Medicine

## 2018-04-03 ENCOUNTER — Encounter: Payer: Self-pay | Admitting: Internal Medicine

## 2018-04-04 ENCOUNTER — Other Ambulatory Visit: Payer: Self-pay | Admitting: Internal Medicine

## 2018-04-26 ENCOUNTER — Other Ambulatory Visit: Payer: Self-pay | Admitting: Internal Medicine

## 2018-04-26 DIAGNOSIS — I10 Essential (primary) hypertension: Secondary | ICD-10-CM

## 2018-04-28 ENCOUNTER — Encounter: Payer: Self-pay | Admitting: Neurology

## 2018-04-30 ENCOUNTER — Other Ambulatory Visit: Payer: Self-pay | Admitting: Neurology

## 2018-04-30 MED ORDER — CARBIDOPA-LEVODOPA 25-100 MG PO TABS: ORAL_TABLET | ORAL | 1 refills | Status: DC

## 2018-06-02 ENCOUNTER — Encounter: Payer: Self-pay | Admitting: Internal Medicine

## 2018-06-05 ENCOUNTER — Encounter: Payer: Self-pay | Admitting: Internal Medicine

## 2018-06-05 ENCOUNTER — Ambulatory Visit (INDEPENDENT_AMBULATORY_CARE_PROVIDER_SITE_OTHER): Payer: PPO | Admitting: Internal Medicine

## 2018-06-05 ENCOUNTER — Other Ambulatory Visit: Payer: Self-pay

## 2018-06-05 VITALS — BP 116/70 | HR 72 | Temp 98.8°F | Ht 61.0 in | Wt 119.4 lb

## 2018-06-05 DIAGNOSIS — F0280 Dementia in other diseases classified elsewhere without behavioral disturbance: Secondary | ICD-10-CM

## 2018-06-05 DIAGNOSIS — Z6822 Body mass index (BMI) 22.0-22.9, adult: Secondary | ICD-10-CM | POA: Diagnosis not present

## 2018-06-05 DIAGNOSIS — R5383 Other fatigue: Secondary | ICD-10-CM | POA: Diagnosis not present

## 2018-06-05 DIAGNOSIS — E039 Hypothyroidism, unspecified: Secondary | ICD-10-CM | POA: Diagnosis not present

## 2018-06-05 DIAGNOSIS — E785 Hyperlipidemia, unspecified: Secondary | ICD-10-CM

## 2018-06-05 DIAGNOSIS — G20 Parkinson's disease: Secondary | ICD-10-CM | POA: Diagnosis not present

## 2018-06-05 DIAGNOSIS — Z79899 Other long term (current) drug therapy: Secondary | ICD-10-CM | POA: Diagnosis not present

## 2018-06-05 DIAGNOSIS — E02 Subclinical iodine-deficiency hypothyroidism: Secondary | ICD-10-CM | POA: Diagnosis not present

## 2018-06-05 DIAGNOSIS — Z9181 History of falling: Secondary | ICD-10-CM | POA: Diagnosis not present

## 2018-06-05 DIAGNOSIS — E038 Other specified hypothyroidism: Secondary | ICD-10-CM

## 2018-06-05 DIAGNOSIS — I10 Essential (primary) hypertension: Secondary | ICD-10-CM

## 2018-06-05 DIAGNOSIS — R634 Abnormal weight loss: Secondary | ICD-10-CM | POA: Diagnosis not present

## 2018-06-05 DIAGNOSIS — G20C Parkinsonism, unspecified: Secondary | ICD-10-CM

## 2018-06-05 MED ORDER — LISINOPRIL 20 MG PO TABS: 20.0000 mg | ORAL_TABLET | Freq: Every day | ORAL | 0 refills | Status: DC

## 2018-06-05 MED ORDER — RIVASTIGMINE TARTRATE 4.5 MG PO CAPS: 4.5000 mg | ORAL_CAPSULE | Freq: Two times a day (BID) | ORAL | 11 refills | Status: DC

## 2018-06-05 MED ORDER — ATORVASTATIN CALCIUM 40 MG PO TABS: 40.0000 mg | ORAL_TABLET | Freq: Every day | ORAL | 3 refills | Status: DC

## 2018-06-05 NOTE — Assessment & Plan Note (Signed)
-  Patient does have a 5 pound weight loss since her last visit to the clinic -Her husband states that her appetite has been good and that she has been eating well -She has not been taking her Ensure or boost shakes -I encouraged her husband to give her the shakes and will follow her weight closely

## 2018-06-05 NOTE — Assessment & Plan Note (Signed)
-  This problem is chronic and stable -Patient does follow with Dr. Daisy BlossomAhearn and has an appointment to see her this coming month -I am concerned the patient has had 2 falls in the last 2 months with her last fall being 5 days ago -Patient states that she was walking with her walker and noted that her legs became tremulous and she fell down.  She denies any loss of consciousness. -I am concerned that her disease may be slowly worsening and that she may need an adjustment in her medications -Patient will follow-up with her neurologist for further recommendations

## 2018-06-05 NOTE — Assessment & Plan Note (Addendum)
-  Patient has been fatigued over the last 2 to 3 days per her husband -She also had a fall approximately 5 days ago in addition to a fall 2 months ago -I suspect that her fatigue and her falls may be related to underlying Parkinson's -We will check a CBC with differential, TSH/free T4 today as well to ensure that there is no other contributing factors to her fatigue -We will also put a home health order for the patient for home PT as both her falls occurred while she was walking and I believe that she would benefit from their services  Addendum: - Patient's CBC and free T4 are wnl. She has a mildly elevated TSH but this has also improved since it was last checked - Discussed results with patient who states she feels better today - No further work up for now

## 2018-06-05 NOTE — Progress Notes (Signed)
   Subjective:    Patient ID: Nicole Baxter, female    DOB: 07/21/1941, 77 y.o.   MRN: [id number]  HPI  I seen and examined this patient.  Patient is here for routine follow-up of her hypertension and hypothyroidism.  Patient's husband states that patient fell 4 to 5 days ago.  Patient states that she was walking with a walker and that her legs started shaking and she fell down.  She denies any loss of consciousness.  Patient also has had a fall approximately 2 months ago as well. Husband states the patient has been fatigued over the last 2 to 3 days and has been sleeping more.  They deny any other complaints at this time and states that she is compliant with her medications.   Review of Systems  Constitutional: Positive for fatigue.       Patient had a fall 5 days ago  HENT: Negative.   Respiratory: Negative.   Cardiovascular: Negative.   Gastrointestinal: Negative.   Musculoskeletal: Negative.   Neurological: Positive for tremors. Negative for dizziness.  Psychiatric/Behavioral: Negative.        Objective:   Physical Exam  Constitutional: She is oriented to person, place, and time. She appears well-developed and well-nourished.  HENT:  Head: Normocephalic and atraumatic.  Mouth/Throat: No oropharyngeal exudate.  Neck: Neck supple.  Cardiovascular: Normal rate, regular rhythm and normal heart sounds.  Pulmonary/Chest: Effort normal and breath sounds normal.  Abdominal: Soft. Bowel sounds are normal. She exhibits no distension. There is no tenderness.  Musculoskeletal: She exhibits no edema.  Lymphadenopathy:    She has no cervical adenopathy.  Neurological: She is alert and oriented to person, place, and time.  Psychiatric: She has a normal mood and affect. Her behavior is normal.          Assessment & Plan:  Please see problem based charting for assessment and plan:

## 2018-06-05 NOTE — Assessment & Plan Note (Addendum)
BP Readings from Last 3 Encounters:  06/05/18 116/70  03/02/18 135/74  02/06/18 133/78    Lab Results  Component Value Date   NA 142 12/08/2017   K 3.5 12/08/2017   CREATININE 0.78 12/08/2017    Assessment: Blood pressure control:  Well-controlled Progress toward BP goal:   At goal Comments: Patient is compliant with lisinopril 20 mg  Plan: Medications:  continue current medications Educational resources provided:   Self management tools provided:   Other plans: Patient had orthostatics done today which were negative.  Will check a BMP on her today.  I reviewed the BP log that her husband brought with her which showed systolics in the 130s to 140s and diastolics in the 90s  Addendum: - BMP wnl. Discussed results with patient via phone

## 2018-06-05 NOTE — Assessment & Plan Note (Signed)
-  This problem is chronic and stable -We will refill her Lipitor today -No further work-up at this time

## 2018-06-05 NOTE — Assessment & Plan Note (Signed)
-  This problem is chronic and stable -Patient's husband does note that patient had been more tired over the last 3 to 4 days and did have a fall 5 days ago -We will check her TSH and free T4 today to ensure that she is not becoming more hypothyroid

## 2018-06-05 NOTE — Patient Instructions (Addendum)
-  It was a pleasure seeing you today -We will check blood work on you today -I have refilled your medications today -I have also referred you for home health care for physical therapy -Please call me if you have any questions -Please follow-up with your neurologist as a suspect that some of your falls may be secondary to your Parkinson's disease

## 2018-06-05 NOTE — Assessment & Plan Note (Signed)
-  This problem is chronic and stable -Patient still with some memory loss but states this is been stable -We will refill her Rivastigmine today

## 2018-06-06 LAB — CBC WITH DIFFERENTIAL/PLATELET
BASOS: 1 %
Basophils Absolute: 0 10*3/uL (ref 0.0–0.2)
EOS (ABSOLUTE): 0.2 10*3/uL (ref 0.0–0.4)
EOS: 3 %
HEMATOCRIT: 40 % (ref 34.0–46.6)
HEMOGLOBIN: 13.2 g/dL (ref 11.1–15.9)
IMMATURE GRANS (ABS): 0 10*3/uL (ref 0.0–0.1)
IMMATURE GRANULOCYTES: 0 %
Lymphocytes Absolute: 1.8 10*3/uL (ref 0.7–3.1)
Lymphs: 29 %
MCH: 32.6 pg (ref 26.6–33.0)
MCHC: 33 g/dL (ref 31.5–35.7)
MCV: 99 fL — AB (ref 79–97)
MONOCYTES: 5 %
MONOS ABS: 0.3 10*3/uL (ref 0.1–0.9)
NEUTROS PCT: 62 %
Neutrophils Absolute: 4 10*3/uL (ref 1.4–7.0)
Platelets: 192 10*3/uL (ref 150–450)
RBC: 4.05 x10E6/uL (ref 3.77–5.28)
RDW: 14.2 % (ref 12.3–15.4)
WBC: 6.3 10*3/uL (ref 3.4–10.8)

## 2018-06-06 LAB — BMP8+ANION GAP
Anion Gap: 15 mmol/L (ref 10.0–18.0)
BUN / CREAT RATIO: 10 — AB (ref 12–28)
BUN: 9 mg/dL (ref 8–27)
CHLORIDE: 104 mmol/L (ref 96–106)
CO2: 25 mmol/L (ref 20–29)
Calcium: 9.5 mg/dL (ref 8.7–10.3)
Creatinine, Ser: 0.87 mg/dL (ref 0.57–1.00)
GFR, EST AFRICAN AMERICAN: 75 mL/min/{1.73_m2} (ref >59)
GFR, EST NON AFRICAN AMERICAN: 65 mL/min/{1.73_m2} (ref >59)
Glucose: 95 mg/dL (ref 65–99)
Potassium: 3.8 mmol/L (ref 3.5–5.2)
Sodium: 144 mmol/L (ref 134–144)

## 2018-06-06 LAB — TSH: TSH: 5.11 u[IU]/mL — ABNORMAL HIGH (ref 0.450–4.500)

## 2018-06-06 LAB — T4, FREE: Free T4: 0.95 ng/dL (ref 0.82–1.77)

## 2018-06-14 DIAGNOSIS — E785 Hyperlipidemia, unspecified: Secondary | ICD-10-CM | POA: Diagnosis not present

## 2018-06-14 DIAGNOSIS — E039 Hypothyroidism, unspecified: Secondary | ICD-10-CM | POA: Diagnosis not present

## 2018-06-14 DIAGNOSIS — Z9181 History of falling: Secondary | ICD-10-CM | POA: Diagnosis not present

## 2018-06-14 DIAGNOSIS — F0280 Dementia in other diseases classified elsewhere without behavioral disturbance: Secondary | ICD-10-CM | POA: Diagnosis not present

## 2018-06-14 DIAGNOSIS — G20 Parkinson's disease: Secondary | ICD-10-CM | POA: Diagnosis not present

## 2018-06-14 DIAGNOSIS — R634 Abnormal weight loss: Secondary | ICD-10-CM | POA: Diagnosis not present

## 2018-06-18 ENCOUNTER — Encounter

## 2018-06-18 ENCOUNTER — Encounter: Payer: Self-pay | Admitting: Neurology

## 2018-06-18 ENCOUNTER — Ambulatory Visit (INDEPENDENT_AMBULATORY_CARE_PROVIDER_SITE_OTHER): Payer: PPO | Admitting: Neurology

## 2018-06-18 VITALS — BP 94/56 | HR 73 | Ht 61.0 in | Wt 119.0 lb

## 2018-06-18 DIAGNOSIS — F0280 Dementia in other diseases classified elsewhere without behavioral disturbance: Secondary | ICD-10-CM

## 2018-06-18 DIAGNOSIS — G20 Parkinson's disease: Secondary | ICD-10-CM | POA: Diagnosis not present

## 2018-06-18 MED ORDER — RIVASTIGMINE TARTRATE 6 MG PO CAPS
6.0000 mg | ORAL_CAPSULE | Freq: Two times a day (BID) | ORAL | 6 refills | Status: AC
Start: 2018-06-18 — End: ?

## 2018-06-18 NOTE — Patient Instructions (Addendum)
Increase Rivastigmine At next appointment or if you call me in a month or two we can statt the following:   Memantine Tablets What is this medicine? MEMANTINE (MEM an teen) is used to treat dementia. This medicine may be used for other purposes; ask your health care provider or pharmacist if you have questions. COMMON BRAND NAME(S): Namenda What should I tell my health care provider before I take this medicine? They need to know if you have any of these conditions: -difficulty passing urine -kidney disease -liver disease -seizures -an unusual or allergic reaction to memantine, other medicines, foods, dyes, or preservatives -pregnant or trying to get pregnant -breast-feeding How should I use this medicine? Take this medicine by mouth with a glass of water. Follow the directions on the prescription label. You may take this medicine with or without food. Take your doses at regular intervals. Do not take your medicine more often than directed. Continue to take your medicine even if you feel better. Do not stop taking except on the advice of your doctor or health care professional. Talk to your pediatrician regarding the use of this medicine in children. Special care may be needed. Overdosage: If you think you have taken too much of this medicine contact a poison control center or emergency room at once. NOTE: This medicine is only for you. Do not share this medicine with others. What if I miss a dose? If you miss a dose, take it as soon as you can. If it is almost time for your next dose, take only that dose. Do not take double or extra doses. If you do not take your medicine for several days, contact your health care provider. Your dose may need to be changed. What may interact with this medicine? -acetazolamide -amantadine -cimetidine -dextromethorphan -dofetilide -hydrochlorothiazide -ketamine -metformin -methazolamide -quinidine -ranitidine -sodium bicarbonate -triamterene This  list may not describe all possible interactions. Give your health care provider a list of all the medicines, herbs, non-prescription drugs, or dietary supplements you use. Also tell them if you smoke, drink alcohol, or use illegal drugs. Some items may interact with your medicine. What should I watch for while using this medicine? Visit your doctor or health care professional for regular checks on your progress. Check with your doctor or health care professional if there is no improvement in your symptoms or if they get worse. You may get drowsy or dizzy. Do not drive, use machinery, or do anything that needs mental alertness until you know how this drug affects you. Do not stand or sit up quickly, especially if you are an older patient. This reduces the risk of dizzy or fainting spells. Alcohol can make you more drowsy and dizzy. Avoid alcoholic drinks. What side effects may I notice from receiving this medicine? Side effects that you should report to your doctor or health care professional as soon as possible: -allergic reactions like skin rash, itching or hives, swelling of the face, lips, or tongue -agitation or a feeling of restlessness -depressed mood -dizziness -hallucinations -redness, blistering, peeling or loosening of the skin, including inside the mouth -seizures -vomiting Side effects that usually do not require medical attention (report to your doctor or health care professional if they continue or are bothersome): -constipation -diarrhea -headache -nausea -trouble sleeping This list may not describe all possible side effects. Call your doctor for medical advice about side effects. You may report side effects to FDA at 1-800-FDA-1088. Where should I keep my medicine? Keep out of the reach  of children. Store at room temperature between 15 degrees and 30 degrees C (59 degrees and 86 degrees F). Throw away any unused medicine after the expiration date. NOTE: This sheet is a  summary. It may not cover all possible information. If you have questions about this medicine, talk to your doctor, pharmacist, or health care provider.  2018 Elsevier/Gold Standard (2013-09-02 14:10:42)

## 2018-06-18 NOTE — Progress Notes (Signed)
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 Discussed fall risks. Doing well on the Sinemet IR. Memory continues to decline.  She continues Exelon. No side effects to the Carbidope/levodopa. No dyskinesias. No dizziness. The medication may be wearing off but unclear if they take it every 4 hours.  Daughter is very involved and here at appointment as is husband. Discussed PD and PD Dementia. They are watching her weight and giving her ensure. No dyskinesias. Slowly progressive memory changes, making phone calls to daughter and repeating herself. No dyskinesias. No dysphagia. Not orthostatic. No behavioral problems, she denies depression and is happy and pleasant at appointment today.  Interval history: She took her medication this morning and still has symptoms. But it is improved. She can tell the difference. No side effects. Will increase to 2 pills three times a day 4 hours a part try to separate from protein. No falls. Swallowing ok, no choking. She talks in her sleep. Discussed REM sleep disorder. No side effects. Daughter not here today.   Interval history 01/09/2017: Daughter notices the sinemet working and lasts until the next dose. No falls. She is not drinking water but she is drinking sprite 2 glasses a day. Discussed this is not enough. She completed physical therapy and she feels this helped. She has not taken her medication today and has a resting tremor. Gait is better after physical therapy. Memory is stable.She can remember her grandchildren's names today. B12 a month ago was much improved to over 900 (from 185).   Interval history 10/08/2016: Patient and husband are here today with daughter for follow-up of Parkinson's disease and  dementia. She has a past medical history of recently diagnosed hyperthyroidism(treated, last tsh wnl), essential tremor, hypertension, asthma, hyperlipidemia, diabetes, osteoarthritis. No history of dopamine blocking agents. 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 Sinemet helps her resting tremor however she forgets to take it.  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 Patient has not been noncompliant with medications or directions. Today fortunately she is here with her daughter who is going to be more involved. Reviewed with daughter that I do feel that carbidopa/levodopa is the best medication for patient unfortunately in the past she would take 1 pill a day or stop taking it all together even though she reported it would help when taking it. Reviewed the history of medications tried with mother and discussedParkinson's disease. We'll try Sinemet again, discussed with daughter that she should be involved with patient's medication management, she should set up a pill dispenser and also visit multiple times a week or hire somebody to manage daily compliance. Patient is a fall risk and this is a concern.  Interval history 02/01/2016:She feels the medication is helping. She has not taken the Sinemet since yesterday. When she takes the sinemet the tremor improves. She takes it 3x a day. Here with husband. Giving ut to her at 10am, 1pm and 8 pm. Advised to take it every 4 hours. Wears off in 2-3 hours. Discussed trying a patch every  day to see if that helps with medication administration. When she takes the medication the tremor improves and then it wears off in 3 hours. No falls. She forgets to take the  medication. She is having some hallucinations. She was seeing people that were not there and babies not there. Her memory is stable. She has drooling. Drool on her pillow and sometimes during the day and it starts dripping. No difficulty swallowing. No choking on foods. No falls. Can continue to take the Sinemet.   Interval History 11/03/2015: here for follow up of Parkinsonism. She says she ran out of the prescription a few days ago and she has not taken the Sinemet in a few days. It really helps when she takes it. When she takes the Sinemet, the tremors stop. No side effects from the Sinemet. She feels the Sinemet lasts until the next dose. She has memory loss. We had ordered an MRI of the brain and she tried to do it but got claustrophobic. We need to get an MRi of the brain due to parkinsonism and memory loss. Will order and give some Xanax before going in to an open MRI. She has a manual wheelchair, we tried to get her a power wheelchair but it was too expensive. She feels much improved since being discharged from the hospital and starting B12 injections. B12 was 185 and she is having injections. Last thyroid was normal. Will do a MoCA today and start Rivastigmine for memory loss. Denies any confusion or altered mentation recently. They take the sinemet at 9am then 3or 4pm then 9pm. Discussed should take it every 4 hours. 9am, 1pm and 5pm. Do not take it with protein. No falls. No problems swallowing, not choking on food. No dyskinesias.  Addendum 07/22/2015: Called to see how she is doing. She hasn't taken the Sinemet since yesterday. Not taking it three times a day as prescribed. Sounds like it is difficullt for patient to remember to take her medication. She does say when she takes the medication it helps.   Interval update 06/24/2015:They are taking 1/2 pill of the sinemet with breakfast. She forgot she is not supposed to take it with food. It helps the tremor. No side effects from the Sinemet. Will  increase the sinemet to a whole pill three times a day. She did not take her sinemet this morning. Symptoms brain they're Sinemet with some the next time they come, we can take it in clinic and evaluate response. But if the Sinemet appears to help with her tremor, and this confirms the diagnosis of Parkinson's. May consider adding Azilect concurrently.  Interval update 05/12/2014: She had the radioactive iodine treatment and tremor is much improved. She still has bilateral upper resting tremor. She says she is walking a little better but husband disagrees. Husband says her knee hurts and stays sore. Husband feels her voice volume is lower and he can't hear her. She shuffles. Her sense of smell is poor. She is off balance.   We'll start Sinemet low dose. They are to call me back in 2 weeks to discuss how patient is responding. Discussed common side effects including nausea and GI side effects, orthostatic hypotension, dizziness and increased risk of falls. They are to stop the medication if significant side effects occur.   04/14/2015: [name] is a 77 y.o. female here as a referral from Dr. Andrey Campanile for tremor.She has a past medical history of recently diagnosed hyperthyroidism. She is here with her husband who provides  much of the information. She also has a past medical history of essential tremor, hypertension, asthma, hyperlipidemia, asthma, diabetes, osteoarthritis. No history of dopamine blocking agents. 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  She has a daily tremor. She had a tremor in the past, similar tremor but it went away. It came back last year. She has good days and bad days with the tremor. Her right leg sometimes won't move. There are some days when it is better. She shuffles and is slow walking. She can't move when walking  sometimes, she freezes. The tone of her voice is lower, husband says she used to talk much louder. She doesn't yell anymore. She denies decreased smell sensation. She has constipation. Her hand writing is terrible. She is able to eat with her tremor. Tremor is better with movement. Worse at rest. Answer legs shake, feels like nervousness. Mother has a tremor. Cousins have tremors so they think it is a family issue. She is hyperthyroid and is going to [facility] for treatment. No FHx of PD. She uses a walker at baseline. Walking has worsened.  Reviewed notes, labs and imaging from outside physicians, which showed: Notes state the patient has been seen by neurology in the past however I cannot find a note in Epic. She follows with Dr. Andrey Campanile who notes resting tremor, rigidity and bradykinesia concerning for Parkinson's disease. She was started on gabapentin 100 mg 3 times a day and referred to neurology. Notes state that years ago she was seen by neurologist and advised to take Benadryl for tremor. Dr. Andrey Campanile advised her to stop due to risk of anticholinergic symptoms in the elderly. TSH decreased 0.132 weeks ago, liver function tests with elevated AST and a LT, BMP unremarkable  Review of Systems: Patient complains of symptoms per HPI as well as the following symptoms: joint pain, nack pain, neck pain, no CP, no SOB. Pertinent negatives per HPI. All others negative.  Social History   Socioeconomic History  . Marital status: Married    Spouse name: Not on file  . Number of children: 5  . Years of education: 56  . Highest education level: Not on file  Occupational History  . Not on file  Social Needs  . Financial resource strain: Not on file  . Food insecurity:    Worry: Not on file    Inability: Not on file  . Transportation needs:    Medical: Not on file    Non-medical: Not on file  Tobacco Use  . Smoking status: Never Smoker  . Smokeless tobacco: Never Used  Substance and Sexual  Activity  . Alcohol use: No    Alcohol/week: 0.0 oz  . Drug use: No  . Sexual activity: Never  Lifestyle  . Physical activity:    Days per week: Not on file    Minutes per session: Not on file  . Stress: Not on file  Relationships  . Social connections:    Talks on phone: Not on file    Gets together: Not on file    Attends religious service: Not on file    Active member of club or organization: Not on file    Attends meetings of clubs or organizations: Not on file    Relationship status: Not on file  . Intimate partner violence:    Fear of current or ex partner: Not  on file    Emotionally abused: Not on file    Physically abused: Not on file    Forced sexual activity: Not on file  Other Topics Concern  . Not on file  Social History Narrative   Worked at [employer] 31 years. Married to [place] .Active, out and about q day.   5 children, 1 with sarcoidosis, 1 with COPD.   Caffeine use: Drinks a 6 pack of soda per week   Right-handed    Family History  Problem Relation Age of Onset  . Heart disease Mother 59       died of MI @ 14  . Alcohol abuse Father   . Cancer Brother   . Colon cancer Neg Hx     Past Medical History:  Diagnosis Date  . Arthritis   . GERD (gastroesophageal reflux disease)    pt. states she no longer has GERD (09/11/15)  . Hyperglycemia    isolated FBS 122, 95, 104 (Random 138)  . Hyperlipidemia    on pravastatin  . Hypertension    controlled on 2 agents  . 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 Obesity   . Parkinson's disease (tremor, stiffness, slow motion, unstable posture) (HCC)   . Shortness of breath dyspnea    pt. states that she no longer has SOB )09/11/15  . Tremor     Past Surgical History:  Procedure Laterality Date  . CESAREAN SECTION    . COLONOSCOPY    . TOTAL KNEE ARTHROPLASTY Left 09/23/2015   Procedure: LEFT TOTAL KNEE ARTHROPLASTY;  Surgeon: Loreta Ave, MD;  Location: [facility] OR;  Service: Orthopedics;   Laterality: Left;    Current Outpatient Medications  Medication Sig Dispense Refill  . atorvastatin (LIPITOR) 40 MG tablet Take 1 tablet (40 mg total) by mouth daily. Patient requested easy open caps for all medication bottles. 90 tablet 3  . carbidopa-levodopa (SINEMET) 25-100 MG tablet Take 2 pills 3 times a day.Take 4 hours apart.  May take an extra dose in the evenings. 320 tablet 1  . cyanocobalamin ([employer] VITAMIN B12) 1000 MCG tablet Take 1 tablet (1,000 mcg total) by mouth daily. 30 tablet 1  . diclofenac sodium (VOLTAREN) 1 % GEL Apply to affected area 3 times a day as needed. 100 g 1  . lisinopril (PRINIVIL,ZESTRIL) 20 MG tablet Take 1 tablet (20 mg total) by mouth daily. 90 tablet 0  . meloxicam (MOBIC) 7.5 MG tablet Take 1 tablet (7.5 mg total) by mouth daily as needed. 90 tablet 0  . rivastigmine (EXELON) 4.5 MG capsule Take 1 capsule (4.5 mg total) by mouth 2 (two) times daily. 60 capsule 11  . albuterol (PROVENTIL HFA;VENTOLIN HFA) 108 (90 Base) MCG/ACT inhaler Inhale 2 puffs into the lungs every 6 (six) hours as needed for wheezing or shortness of breath. (Patient not taking: Reported on 06/18/2018) 1 Inhaler 2   No current facility-administered medications for this visit.     Allergies as of 06/18/2018  . (No Known Allergies)    Vitals: BP (!) 94/56 (BP Location: Left Arm, Patient Position: Sitting)   Pulse 73   Ht 5\' 1"  (1.549 m)   Wt 119 lb (54 kg)   BMI 22.48 kg/m  Last Weight:  Wt Readings from Last 1 Encounters:  06/18/18 119 lb (54 kg)   Last Height:   Ht Readings from Last 1 Encounters:  06/18/18 5\' 1"  (1.549 m)   MMSE - Mini Mental State  Exam 06/18/2018  Orientation to time 1  Orientation to Place 4  Registration 3  Attention/ Calculation 2  Recall 0  Language- name 2 objects 2  Language- repeat 1  Language- follow 3 step command 2  Language- read & follow direction 1  Write a sentence 0  Copy design 0  Total score 16   Montreal Cognitive  Assessment  01/09/2017 11/03/2015  Visuospatial/ Executive (0/5) 1 1  Naming (0/3) 3 3  Attention: Read list of digits (0/2) 2 1  Attention: Read list of letters (0/1) 0 1  Attention: Serial 7 subtraction starting at 100 (0/3) 1 0  Language: Repeat phrase (0/2) 1 0  Language : Fluency (0/1) 0 1  Abstraction (0/2) 2 1  Delayed Recall (0/5) 0 0  Orientation (0/6) 5 6  Total 15 14  Adjusted Score (based on education) 16 15    Coordination: bradykinesia and dec. amplitude on finger taps  Gait: freezing, shuffling, en-bloc turning. Decreased arm swing, re-emergent tremor. Bradykinesia, stable    Motor Observation:  Resting tremor in the bilateral upper extremities which is significantly improved today  Tone:  Increased tone in the upper extremities, mid cogwheeling which is significantly improved  Posture:  stooped   Strength:  Strength is symmetric in the extremities, difficult motor exam due to dementia   Sensation: intact to LT      Assessment/Plan: Woodie L Keough is a 77y.o. female here as a For f/u of Parkinson's disease. Patient and husband amd daughter are here today. Patient does report freezing, shuffling of gait, resting tremor, hypophonia, impaired smell. Exam does show bradykinesia on finger taps and walking, freezing, en-bloc turning, increased tone in the upper extremities and resting tremor in upper extremities with re-emergent tremor and decreased arm swing. Patient has not been noncompliant with medications or directions in the past but appears daughter is helping more now.  - Patient improved today, daughter very involved and it is apparent, patient looks well with improved symptoms taking her medications  - Pcp ordered Home health and PT coming out tomorroe - fall risk, discussed fall precautions, always use walking aid - Increase Exelon for dementia - At next appointment may start Namenda - Dr. Don Perking office has a parkinson's  disease Child psychotherapist, will refer there  Will continue Sinemet to 2 pilsl 3x a day and can increase to 4x a day if needed or an extra pill in the evening as needed,  asked her to hydrate more and watch for lightheadedness and hypotension. Asked husband and daughterto pay attention when she takes the pill and see if tremor improves(per daughter last appointment it does significantly) and patient endorses as well today, how much it improves, and when the Sinemet starts wearing off(it lasts 4 hours). Watch for side effects. Take it 4 hours apart such as 9am, 1pm and 5pm. Do not take with protein, take 30 minutes before or an hour after eating protein. May take an extra dose in the evenings if needed.   Taking PO B12 and improved to over 900 from 185. Her thyroid appears to be normalized, had hyperthyroidism.   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 Increase Rivastigmine to 6mg  twice daily. Discussed side effects again: Nausea, vomiting, diarrhea, loss of appetite/weight loss, dizziness, drowsiness, weakness, trouble sleeping, shakiness (tremor), or muscle cramps. More serious side effects can include AV block, bradycardia, syncope, seizures, GI bleeding, rash.  Needs follow up in 6 months.Asked daughter to attend all appts.  Discussed  parkinson's disease vs Parkinsonism, discussed again, what it is and the symptoms. Discussed that Parkinson's disease is a neurodegenerative disease which involves dopamine in the brain. Progression is variable. People with Parkinson's can display tremor, shuffling, stooped posture, impaired balance and falls, lowered voice volume, constipation, decreased smell sensation. Risk of falls is especially problematic and vigilance as needed. Patient should always use her walker. Provided patient and daughterhandout on Parkinson's disorder, online organizations as well as Parkinson's support group that meets here in [place] with contact  information.  Meds ordered this encounter  Medications  . rivastigmine (EXELON) 6 MG capsule    Sig: Take 1 capsule (6 mg total) by mouth 2 (two) times daily.    Dispense:  180 capsule    Refill:  6    Orders Placed This Encounter  Procedures  . Ambulatory referral to Social Work     Naomie Dean, MD  [facility] [address] [place], [place] [address]  Phone [phone] Fax [phone]  A total of was spent face-to-face with this patient. Over half this time was spent on counseling patient on the Parkinson disease and dementia diagnosis and different diagnostic and therapeutic options available.

## 2018-06-21 ENCOUNTER — Encounter (INDEPENDENT_AMBULATORY_CARE_PROVIDER_SITE_OTHER): Payer: Self-pay

## 2018-07-16 ENCOUNTER — Ambulatory Visit (INDEPENDENT_AMBULATORY_CARE_PROVIDER_SITE_OTHER): Payer: PPO | Admitting: Internal Medicine

## 2018-07-16 ENCOUNTER — Other Ambulatory Visit: Payer: Self-pay

## 2018-07-16 DIAGNOSIS — I10 Essential (primary) hypertension: Secondary | ICD-10-CM | POA: Diagnosis not present

## 2018-07-16 DIAGNOSIS — Z79899 Other long term (current) drug therapy: Secondary | ICD-10-CM

## 2018-07-16 DIAGNOSIS — I959 Hypotension, unspecified: Secondary | ICD-10-CM | POA: Insufficient documentation

## 2018-07-16 DIAGNOSIS — F0280 Dementia in other diseases classified elsewhere without behavioral disturbance: Secondary | ICD-10-CM | POA: Diagnosis not present

## 2018-07-16 DIAGNOSIS — G20 Parkinson's disease: Secondary | ICD-10-CM | POA: Diagnosis not present

## 2018-07-16 DIAGNOSIS — I951 Orthostatic hypotension: Secondary | ICD-10-CM

## 2018-07-16 MED ORDER — SODIUM CHLORIDE 0.9 % IV BOLUS
500.0000 mL | Freq: Once | INTRAVENOUS | Status: AC
  Administered 2018-07-16: 500 mL via INTRAVENOUS

## 2018-07-16 NOTE — Assessment & Plan Note (Signed)
Hypotension: [name] presents from home for evaluation of hypotension. 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 She also has a history of essential hypertension and takes lisinopril 20 mg QD (previously on 40 mg QD which was reduced due to frequent episodes of hypotension). Her BP today is 84/50 and orthostatics are positive from lying to sitting. She is asymptomatic. She appears hypovolemic on exam. 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 Autonomic dysfunction likely also playing a role. Will give 500 cc NS bolus. Asked her husband to hold her home lisinopril x 1 week at which [facility]  - 500 cc NS bolus in clinic - Hold home lisinopril x 1 week  - [facility]

## 2018-07-16 NOTE — Progress Notes (Signed)
   CC: Hypotension   HPI:  Ms.Cartier L Yetta BarreJones is a 77 y.o. female with PMH listed below who presents to clinic for evaluation of hypotension.   Hypotension: [name] presents from home for evaluation of hypotension. 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 She also has a history of essential hypertension and takes lisinopril 20 mg QD (previously on 40 mg QD which was reduced due to frequent episodes of hypotension). Her BP today is 84/50 and orthostatics are positive from lying to sitting. She is asymptomatic. She appears hypovolemic on exam. 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 Autonomic dysfunction likely also playing a role. Will give 500 cc NS bolus. Asked her husband to hold her home lisinopril x 1 week at which time she should [facility]  - 500 cc NS bolus in clinic - Hold home lisinopril x 1 week  - [facility]   Past Medical History:  Diagnosis Date  . Arthritis   . GERD (gastroesophageal reflux disease)    pt. states she no longer has GERD (09/11/15)  . Hyperglycemia    isolated FBS 122, 95, 104 (Random 138)  . Hyperlipidemia    on pravastatin  . Hypertension    controlled on 2 agents  . 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 Obesity   . Parkinson's disease (tremor, stiffness, slow motion, unstable posture) (HCC)   . Shortness of breath dyspnea    pt. states that she no longer has SOB )09/11/15  . Tremor    Review of Systems:   Review of Systems  Constitutional: Positive for malaise/fatigue. Negative for chills and fever.  Respiratory: Negative for shortness of breath.   Cardiovascular: Negative for chest pain, palpitations and leg swelling.  Neurological: Negative for dizziness and headaches.   Physical  Exam: Vitals:   07/16/18 1354  BP: (!) 84/50  Pulse: 93  Temp: 98.4 F (36.9 C)  TempSrc: Oral  SpO2: 95%  Height: 5' 1.5" (1.562 m)   General: elderly female, frail, who appears in no distress Mouth: Dry mucous membranes, OP clear without erythema or exudates CV: RRR, nl S1/S2, no mrg  Ext: Warm and well-perfused without cyanosis or edema, decreased skin turgor  Assessment & Plan:   See Encounters Tab for problem based charting.  Patient seen with Dr. Heide SparkNarendra

## 2018-07-16 NOTE — Patient Instructions (Addendum)
[name],   Your blood pressure is low again therefore it is very important that you stay well-hydrated at home and drink plenty of water.  Please do not take lisinopril for the next week and make a follow-up appointment with us in 1 week to check on your blood pressure again.  If you notice your blood pressure is above 160/100 please give us a call as you may need to restart your home lisinopril.  Please call us if you have any questions or concerns.  -Dr. Evelene CroonSantos

## 2018-07-17 NOTE — Progress Notes (Signed)
Internal Medicine Clinic Attending  I saw and evaluated the patient.  I personally confirmed the key portions of the history and exam documented by Dr. Santos-Sanchez and I reviewed pertinent patient test results.  The assessment, diagnosis, and plan were formulated together and I agree with the documentation in the resident's note. 

## 2018-07-23 ENCOUNTER — Other Ambulatory Visit: Payer: Self-pay

## 2018-07-23 ENCOUNTER — Ambulatory Visit (INDEPENDENT_AMBULATORY_CARE_PROVIDER_SITE_OTHER): Payer: PPO | Admitting: Internal Medicine

## 2018-07-23 DIAGNOSIS — I951 Orthostatic hypotension: Secondary | ICD-10-CM

## 2018-07-23 DIAGNOSIS — G20 Parkinson's disease: Secondary | ICD-10-CM

## 2018-07-23 NOTE — Assessment & Plan Note (Signed)
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 She was seen at the clinic on 07/16/2018 for hypotension with BP of 84 4/50 and positive orthostatic vitals.  She received 500 cc of normal saline in the clinic and was told to hold lisinopril 20 mg daily.  Today, she reports that her dizziness with standing has significantly improved as she has increased her oral intake and drinking plenty of fluids but still endorses decreased appetite and has been supplementing her diet with one Ensure a day..  Home BP with range 110s-140s/70s-90s.  BP at the clinic today of 114/61 with positive orthostatics from lying to sitting.  She is able to ambulate at home with a walker and cane is also well aware of getting up slowly when up standing.  In January of this year, she participated in physical therapy which she reports of improvement but currently states that she feels her present condition would not benefit from physical therapy.  I would like to continue holding lisinopril 20 mg as her home BP has been stable without it for the past week.  She has had a 40 pound weight loss since July 2017 and might not require antihypertensives.  Plan: - Discontinue lisinopril 20 mg for now - Advised to stand slowly, slowly ambulate after about 20 to 30 seconds of standing -Next clinic visit: Discuss physical therapy with PCP.

## 2018-07-23 NOTE — Progress Notes (Signed)
   CC: Follow up orthostatic hypotension   HPI:  [name] is a 77 y.o. withYetta Baxter medical history as listed below presenting to follow-up on hypotension.  Orthostatic hypotension: 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 She was seen at the clinic on 07/16/2018 for hypotension with BP of 84 4/50 and positive orthostatic vitals.  She received 500 cc of normal saline in the clinic and was told to hold lisinopril 20 mg daily.  Today, she reports that her dizziness with standing has significantly improved as she has increased her oral intake and drinking plenty of fluids but still endorses decreased appetite and has been supplementing her diet with one Ensure a day..  Home BP with range 110s-140s/70s-90s.  BP at the clinic today of 114/61 with positive orthostatics from lying to sitting.  She is able to ambulate at home with a walker and cane is also well aware of getting up slowly when up standing.  In January of this year, she participated in physical therapy which she reports of improvement but currently states that she feels her present condition would not benefit from physical therapy.  I would like to continue holding lisinopril 20 mg as her home BP has been stable without it for the past week.  She has had a 40 pound weight loss since July 2017 and might not require antihypertensives.  Plan: - Discontinue lisinopril 20 mg for now - Advised to stand slowly, slowly ambulate after about 20 to 30 seconds of standing -Next clinic visit: Discuss physical therapy with PCP.  Past Medical History:  Diagnosis Date  . Arthritis   . GERD (gastroesophageal reflux disease)    pt. states she no longer has GERD (09/11/15)  . Hyperglycemia    isolated FBS 122, 95, 104 (Random 138)  . Hyperlipidemia    on pravastatin  . Hypertension    controlled on 2 agents  . 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 Obesity   . Parkinson's disease (tremor, stiffness, slow motion, unstable posture) (HCC)   . Shortness of breath dyspnea    pt. states that she no longer has SOB )09/11/15  . Tremor    Review of Systems: As per HPI  Physical Exam:  Vitals:   07/23/18 1337  BP: 114/61  Pulse: 64  Temp: 98.4 F (36.9 C)  TempSrc: Oral  SpO2: 100%  Weight: 118 lb 14.4 oz (53.9 kg)  Height: 5' 1.5" (1.562 m)   Constitutional: In NAD, appears frail CV: RRR, no MGR Resp: CTABL, no wheezes, crackles, rhonchi  Abd: BS+, soft, non distended  Ext: Bilateral upper extremity resting tremors, no lower extremity pitting edema  Neuro: Alert with intermittent spells of forgetfulness   Assessment & Plan:   See Encounters Tab for problem based charting.  Patient discussed with Dr. Rogelia BogaButcher

## 2018-07-23 NOTE — Patient Instructions (Addendum)
[name],   It was a pleasure taking care of you today at the clinic.  Your blood pressure at home has been doing pretty well and also your blood pressure at the clinic today was okay.  Here are my recommendations after today's visit:  1.  I want you to stop taking the blood pressure medication (lisinopril).  I will also call your pharmacy to remove it from the list. 2.  I will you think about physical therapy and discuss it with Dr. Heide SparkNarendra at your visit with him in September  ~Dr. Dortha SchwalbeAgyei

## 2018-07-24 NOTE — Progress Notes (Signed)
Internal Medicine Clinic Attending  I saw and evaluated the patient.  I personally confirmed the key portions of the history and exam documented by Dr. Agyei and I reviewed pertinent patient test results.  The assessment, diagnosis, and plan were formulated together and I agree with the documentation in the resident's note.  

## 2018-08-04 ENCOUNTER — Emergency Department (HOSPITAL_COMMUNITY)
Admission: EM | Admit: 2018-08-04 | Discharge: 2018-08-04 | Disposition: A | Discharge status: Home / Self Care | Payer: PPO | Attending: Emergency Medicine | Admitting: Emergency Medicine

## 2018-08-04 ENCOUNTER — Emergency Department (HOSPITAL_COMMUNITY): Payer: PPO

## 2018-08-04 ENCOUNTER — Other Ambulatory Visit: Payer: Self-pay

## 2018-08-04 ENCOUNTER — Encounter (HOSPITAL_COMMUNITY): Payer: Self-pay | Admitting: Emergency Medicine

## 2018-08-04 DIAGNOSIS — Z79899 Other long term (current) drug therapy: Secondary | ICD-10-CM | POA: Insufficient documentation

## 2018-08-04 DIAGNOSIS — I951 Orthostatic hypotension: Secondary | ICD-10-CM

## 2018-08-04 DIAGNOSIS — G3183 Dementia with Lewy bodies: Secondary | ICD-10-CM | POA: Diagnosis not present

## 2018-08-04 DIAGNOSIS — I10 Essential (primary) hypertension: Secondary | ICD-10-CM | POA: Diagnosis not present

## 2018-08-04 DIAGNOSIS — Z96652 Presence of left artificial knee joint: Secondary | ICD-10-CM | POA: Insufficient documentation

## 2018-08-04 DIAGNOSIS — J4520 Mild intermittent asthma, uncomplicated: Secondary | ICD-10-CM | POA: Diagnosis not present

## 2018-08-04 DIAGNOSIS — R0989 Other specified symptoms and signs involving the circulatory and respiratory systems: Secondary | ICD-10-CM | POA: Diagnosis not present

## 2018-08-04 HISTORY — DX: Unspecified dementia, unspecified severity, without behavioral disturbance, psychotic disturbance, mood disturbance, and anxiety: F03.90

## 2018-08-04 LAB — CBC WITH DIFFERENTIAL/PLATELET
Basophils Absolute: 0 10*3/uL (ref 0.0–0.1)
Basophils Relative: 1 %
Eosinophils Absolute: 0.2 10*3/uL (ref 0.0–0.7)
Eosinophils Relative: 3 %
HCT: 40.5 % (ref 36.0–46.0)
HEMOGLOBIN: 13.5 g/dL (ref 12.0–15.0)
LYMPHS ABS: 1.9 10*3/uL (ref 0.7–4.0)
LYMPHS PCT: 25 %
MCH: 33 pg (ref 26.0–34.0)
MCHC: 33.3 g/dL (ref 30.0–36.0)
MCV: 99 fL (ref 78.0–100.0)
Monocytes Absolute: 0.4 10*3/uL (ref 0.1–1.0)
Monocytes Relative: 5 %
NEUTROS ABS: 5.1 10*3/uL (ref 1.7–7.7)
NEUTROS PCT: 66 %
Platelets: 174 10*3/uL (ref 150–400)
RBC: 4.09 MIL/uL (ref 3.87–5.11)
RDW: 13 % (ref 11.5–15.5)
WBC: 7.6 10*3/uL (ref 4.0–10.5)

## 2018-08-04 LAB — BASIC METABOLIC PANEL
ANION GAP: 7 (ref 5–15)
BUN: 16 mg/dL (ref 8–23)
CHLORIDE: 108 mmol/L (ref 98–111)
CO2: 27 mmol/L (ref 22–32)
Calcium: 9 mg/dL (ref 8.9–10.3)
Creatinine, Ser: 0.91 mg/dL (ref 0.44–1.00)
GFR calc Af Amer: >60 mL/min (ref >60)
GFR calc non Af Amer: >60 mL/min (ref >60)
GLUCOSE: 101 mg/dL — AB (ref 70–99)
POTASSIUM: 3.7 mmol/L (ref 3.5–5.1)
Sodium: 142 mmol/L (ref 135–145)

## 2018-08-04 LAB — URINALYSIS, ROUTINE W REFLEX MICROSCOPIC
BILIRUBIN URINE: NEGATIVE
Glucose, UA: NEGATIVE mg/dL
HGB URINE DIPSTICK: NEGATIVE
KETONES UR: 5 mg/dL — AB
Leukocytes, UA: NEGATIVE
Nitrite: NEGATIVE
PH: 5 (ref 5.0–8.0)
Protein, ur: NEGATIVE mg/dL
SPECIFIC GRAVITY, URINE: 1.027 (ref 1.005–1.030)

## 2018-08-04 LAB — TROPONIN I: Troponin I: <0.03 ng/mL (ref <0.03)

## 2018-08-04 LAB — I-STAT CG4 LACTIC ACID, ED: Lactic Acid, Venous: 0.93 mmol/L (ref 0.5–1.9)

## 2018-08-04 MED ORDER — SODIUM CHLORIDE 0.9 % IV BOLUS
500.0000 mL | Freq: Once | INTRAVENOUS | Status: AC
  Administered 2018-08-04: 500 mL via INTRAVENOUS

## 2018-08-04 NOTE — ED Provider Notes (Signed)
[facility] EMERGENCY DEPARTMENT Provider Note   CSN: [id number] Arrival date & time: 08/04/18  1327     History   Chief Complaint Chief Complaint  Patient presents with  . Hypotension    HPI Nicole Baxter is a 77 y.o. female.  The history is provided by the patient and a caregiver. The history is limited by the condition of the patient (Hx dementia).    Pt was seen at 1350. Per pt's caregiver: Pt's BP was "97/72" this morning. Pt's caregiver takes pt's BP every morning. States pt was evaluated by PMD on 07/16/2018 for similar symptoms, was give IVF bolus and told to stop taking lisinopril. Pt had f/u appt at PMD's office on 07/23/2018 and "she was better." Pt was told to continue to hold lisinopril.  Pt's family states they gave pt one dose of lisinopril 2 days ago because "they felt her blood pressure was high" (they did not call PMD's office for recommendations). Pt denies any complaints. Denies CP/SOB, no abd pain, no N/V/D, no back pain, no focal motor weakness, no near syncope, no fevers.   Past Medical History:  Diagnosis Date  . Arthritis   . GERD (gastroesophageal reflux disease)    pt. states she no longer has GERD (09/11/15)  . Hyperglycemia    isolated FBS 122, 95, 104 (Random 138)  . Hyperlipidemia    on pravastatin  . Hypertension    controlled on 2 agents  . 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 Obesity   . Parkinson's disease (tremor, stiffness, slow motion, unstable posture) (HCC)   . Shortness of breath dyspnea    pt. states that she no longer has SOB )09/11/15  . Tremor     Patient Active Problem List   Diagnosis Date Noted  . Hypotension 07/16/2018  . Fatigue 06/05/2018  . Mild intermittent asthma, uncomplicated 10/31/2017  . Weight loss, unintentional 07/11/2017  . Lightheadedness 06/16/2017  . Chronic bilateral low back pain without sciatica 12/19/2016  . 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 Hypertensive retinopathy  of both eyes, grade 2 01/15/2016  . 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 History of colon polyps 12/25/2014  . Health care maintenance 02/13/2014  . Subclinical hypothyroidism 12/25/2013  . Right renal mass 06/11/2012  . Osteoarthritis of both knees 03/15/2012  . Parkinsonism (HCC) 07/13/2010  . ALLERGIC RHINITIS, SEASONAL 02/20/2007  . Hyperlipidemia 09/27/2006  . Essential hypertension 09/27/2006  . GERD 09/27/2006    Past Surgical History:  Procedure Laterality Date  . CESAREAN SECTION    . COLONOSCOPY    . TOTAL KNEE ARTHROPLASTY Left 09/23/2015   Procedure: LEFT TOTAL KNEE ARTHROPLASTY;  Surgeon: Loreta Ave, MD;  Location: [facility] OR;  Service: Orthopedics;  Laterality: Left;     OB History   None      Home Medications    Prior to Admission medications   Medication Sig Start Date End Date Taking? Authorizing Provider  atorvastatin (LIPITOR) 40 MG tablet Take 1 tablet (40 mg total) by mouth daily. Patient requested easy open caps for all medication bottles. 06/05/18  Yes [name], MD  carbidopa-levodopa (SINEMET) 25-100 MG tablet Take 2 pills 3 times a day.Take 4 hours apart.  May take an extra dose in the evenings. 04/30/18  Yes Anson Fret, MD  cyanocobalamin ([employer] VITAMIN B12) 1000 MCG tablet Take 1 tablet (1,000 mcg total) by mouth daily. 02/08/17  Yes [name], MD  diclofenac sodium (  VOLTAREN) 1 % GEL Apply to affected area 3 times a day as needed. 08/08/17  Yes [name], MD  lisinopril (PRINIVIL,ZESTRIL) 20 MG tablet Take 20 mg by mouth daily.   Yes [provider]  meloxicam (MOBIC) 7.5 MG tablet Take 1 tablet (7.5 mg total) by mouth daily as needed. 08/18/17  Yes [name], MD  rivastigmine (EXELON) 6 MG capsule Take 1 capsule (6 mg total) by mouth 2 (two) times daily. 06/18/18  Yes Anson Fret, MD  albuterol (PROVENTIL HFA;VENTOLIN HFA) 108 (90 Base) MCG/ACT inhaler Inhale 2 puffs into the  lungs every 6 (six) hours as needed for wheezing or shortness of breath. Patient not taking: Reported on 06/18/2018 10/31/17   Rozann Lesches, MD    Family History Family History  Problem Relation Age of Onset  . Heart disease Mother 59       died of MI @ 56  . Alcohol abuse Father   . Cancer Brother   . Colon cancer Neg Hx     Social History Social History   Tobacco Use  . Smoking status: Never Smoker  . Smokeless tobacco: Never Used  Substance Use Topics  . Alcohol use: No    Alcohol/week: 0.0 standard drinks  . Drug use: No     Allergies   Patient has no known allergies.   Review of Systems Review of Systems  Unable to perform ROS: Dementia       Physical Exam Updated Vital Signs BP 136/80   Pulse 67   Temp 98 F (36.7 C) (Oral)   Resp 15   Ht 5' 1.5" (1.562 m)   Wt 54.4 kg   SpO2 96%   BMI 22.31 kg/m    Patient Vitals for the past 24 hrs:  BP Temp Temp src Pulse Resp SpO2 Height Weight  08/04/18 1500 136/80 - - - 15 - - -  08/04/18 1430 120/69 - - 67 18 96 % - -  08/04/18 1415 - - - 68 15 97 % - -  08/04/18 1400 109/73 - - 66 13 96 % - -  08/04/18 1339 92/65 - - - - - - -  08/04/18 1338 - - - - - - 5' 1.5" (1.562 m) 54.4 kg  08/04/18 1337 (!) 83/56 98 F (36.7 C) Oral 80 17 95 % - -   14:12 Orthostatic Vital Signs VB  Orthostatic Lying   BP- Lying: 121/78   Pulse- Lying: 76       Orthostatic Sitting  BP- Sitting: 114/73   Pulse- Sitting: 75       Orthostatic Standing at 0 minutes  BP- Standing at 0 minutes: 99/74   Pulse- Standing at 0 minutes: 107    16:18 Orthostatic Vital Signs VB  Orthostatic Lying   BP- Lying: 158/87   Pulse- Lying: 58       Orthostatic Sitting  BP- Sitting: 166/82   Pulse- Sitting: 57       Orthostatic Standing at 0 minutes  BP- Standing at 0 minutes: 162/105Abnormal    Pulse- Standing at 0 minutes: 80        Physical Exam 1355: Physical examination:  Nursing notes reviewed; Vital signs and  O2 SAT reviewed;  Constitutional: Well developed, Well nourished, Well hydrated, In no acute distress; Head:  Normocephalic, atraumatic; Eyes: EOMI, PERRL, No scleral icterus; ENMT: Mouth and pharynx normal, Mucous membranes moist; Neck: Supple, Full range of motion, No lymphadenopathy; Cardiovascular: Regular rate and rhythm, No gallop;  Respiratory: Breath sounds clear & equal bilaterally, No wheezes.  Speaking full sentences with ease, Normal respiratory effort/excursion; Chest: Nontender, Movement normal; Abdomen: Soft, Nontender, Nondistended, Normal bowel sounds; Genitourinary: No CVA tenderness; Extremities: Peripheral pulses normal, No tenderness, No edema, No calf edema or asymmetry.; Neuro: Awake, alert, confused per hx dementia. No facial droop. Speech clear. Moves all extremities spontaneously and to command without apparent gross focal motor deficits. Climbs on and off stretcher easily by herself. Gait steady..; Skin: Color normal, Warm, Dry.   ED Treatments / Results  Labs (all labs ordered are listed, but only abnormal results are displayed)   EKG EKG Interpretation  Date/Time:  Saturday August 04 2018 14:04:07 EDT Ventricular Rate:  71 PR Interval:    QRS Duration: 90 QT Interval:  392 QTC Calculation: 426 R Axis:   52 Text Interpretation:  Sinus rhythm Low voltage, precordial leads When compared with ECG of 06/16/2017 No significant change was found Confirmed by Samuel Jester [phone]) on 08/04/2018 2:37:43 PM   Radiology   Procedures Procedures (including critical care time)  Medications Ordered in ED Medications  sodium chloride 0.9 % bolus 500 mL (500 mLs Intravenous New Bag/Given 08/04/18 1515)     Initial Impression / Assessment and Plan / ED Course  I have reviewed the triage vital signs and the nursing notes.  Pertinent labs & imaging results that were available during my care of the patient were reviewed by me and considered in my medical decision making  (see chart for details).  MDM Reviewed: previous chart, nursing note and vitals Reviewed previous: labs and ECG Interpretation: labs, ECG and x-ray   Results for orders placed or performed during the hospital encounter of 08/04/18  CBC with Differential  Result Value Ref Range   WBC 7.6 4.0 - 10.5 K/uL   RBC 4.09 3.87 - 5.11 MIL/uL   Hemoglobin 13.5 12.0 - 15.0 g/dL   HCT 60.4 54.0 - 98.1 %   MCV 99.0 78.0 - 100.0 fL   MCH 33.0 26.0 - 34.0 pg   MCHC 33.3 30.0 - 36.0 g/dL   RDW 19.1 47.8 - 29.5 %   Platelets 174 150 - 400 K/uL   Neutrophils Relative % 66 %   Neutro Abs 5.1 1.7 - 7.7 K/uL   Lymphocytes Relative 25 %   Lymphs Abs 1.9 0.7 - 4.0 K/uL   Monocytes Relative 5 %   Monocytes Absolute 0.4 0.1 - 1.0 K/uL   Eosinophils Relative 3 %   Eosinophils Absolute 0.2 0.0 - 0.7 K/uL   Basophils Relative 1 %   Basophils Absolute 0.0 0.0 - 0.1 K/uL  Urinalysis, Routine w reflex microscopic  Result Value Ref Range   Color, Urine YELLOW YELLOW   APPearance CLEAR CLEAR   Specific Gravity, Urine 1.027 1.005 - 1.030   pH 5.0 5.0 - 8.0   Glucose, UA NEGATIVE NEGATIVE mg/dL   Hgb urine dipstick NEGATIVE NEGATIVE   Bilirubin Urine NEGATIVE NEGATIVE   Ketones, ur 5 (A) NEGATIVE mg/dL   Protein, ur NEGATIVE NEGATIVE mg/dL   Nitrite NEGATIVE NEGATIVE   Leukocytes, UA NEGATIVE NEGATIVE  Troponin I  Result Value Ref Range   Troponin I <0.03 <0.03 ng/mL  Basic metabolic panel  Result Value Ref Range   Sodium 142 135 - 145 mmol/L   Potassium 3.7 3.5 - 5.1 mmol/L   Chloride 108 98 - 111 mmol/L   CO2 27 22 - 32 mmol/L   Glucose, Bld 101 (H) 70 - 99 mg/dL  BUN 16 8 - 23 mg/dL   Creatinine, Ser 1.19 0.44 - 1.00 mg/dL   Calcium 9.0 8.9 - 14.7 mg/dL   GFR calc non Af Amer >60 >60 mL/min   GFR calc Af Amer >60 >60 mL/min   Anion gap 7 5 - 15  I-Stat CG4 Lactic Acid, ED  Result Value Ref Range   Lactic Acid, Venous 0.93 0.5 - 1.9 mmol/L   Dg Chest Port 1 View Result Date:  08/04/2018 CLINICAL DATA:  77 year old female with a history of low blood pressure EXAM: PORTABLE CHEST 1 VIEW COMPARISON:  None. FINDINGS: Cardiomediastinal silhouette within normal limits in size and contour, with the mediastinal silhouettes likely central weighted by right rotation. No evidence of central vascular congestion. No pneumothorax or pleural effusion. No confluent airspace disease. No displaced fracture. IMPRESSION: Negative for acute cardiopulmonary disease Electronically Signed   By: Gilmer Mor D.O.   On: 08/04/2018 15:01    1635:  Judicious IVF bolus given for orthostatic hypotension with improvement. Pt states she wants to go home now. Cautioned family regarding dosing pt's BP meds without talking to pt's PMD first; family verb understanding. Dx and testing d/w pt and family.  Questions answered.  Verb understanding, agreeable to d/c home with outpt f/u.     Final Clinical Impressions(s) / ED Diagnoses   Final diagnoses:  None    ED Discharge Orders    None       Samuel Jester, DO 08/07/18 [id number]

## 2018-08-04 NOTE — ED Triage Notes (Addendum)
Per family member patient's blood pressure 97/72 this morning when he checked. Family member states that he checks her blood pressure every morning. Patient seen by PCP for similar reason 8/19  in which she was given IVF. Denies any symptoms fevers, dizziness, pain.

## 2018-08-04 NOTE — ED Notes (Signed)
Per family member pt has mild dementia

## 2018-08-04 NOTE — ED Notes (Signed)
Lab called and needed new orders for trop and bmet due to blood had hemolyzed.

## 2018-08-04 NOTE — Discharge Instructions (Addendum)
Continue to hold your lisinopril as previously directed.  Increase your fluid intake (ie:  Gatoraide) for the next few days. Move slowly when changing positions.  Call your regular medical doctor on Monday to schedule a follow up appointment within the next 2 days.  Return to the Emergency Department immediately sooner if worsening.

## 2018-08-06 LAB — URINE CULTURE

## 2018-08-07 ENCOUNTER — Encounter: Payer: Self-pay | Admitting: Internal Medicine

## 2018-08-07 ENCOUNTER — Other Ambulatory Visit: Payer: Self-pay

## 2018-08-07 ENCOUNTER — Ambulatory Visit (INDEPENDENT_AMBULATORY_CARE_PROVIDER_SITE_OTHER): Payer: PPO | Admitting: Internal Medicine

## 2018-08-07 VITALS — BP 115/75 | HR 74 | Temp 98.5°F | Ht 61.5 in | Wt 117.2 lb

## 2018-08-07 DIAGNOSIS — Z0000 Encounter for general adult medical examination without abnormal findings: Secondary | ICD-10-CM

## 2018-08-07 DIAGNOSIS — Z23 Encounter for immunization: Secondary | ICD-10-CM

## 2018-08-07 DIAGNOSIS — Z9181 History of falling: Secondary | ICD-10-CM | POA: Diagnosis not present

## 2018-08-07 DIAGNOSIS — G20 Parkinson's disease: Secondary | ICD-10-CM

## 2018-08-07 DIAGNOSIS — I951 Orthostatic hypotension: Secondary | ICD-10-CM

## 2018-08-07 DIAGNOSIS — R42 Dizziness and giddiness: Secondary | ICD-10-CM | POA: Diagnosis not present

## 2018-08-07 DIAGNOSIS — Z79899 Other long term (current) drug therapy: Secondary | ICD-10-CM

## 2018-08-07 DIAGNOSIS — I10 Essential (primary) hypertension: Secondary | ICD-10-CM | POA: Diagnosis not present

## 2018-08-07 MED ORDER — CARBIDOPA-LEVODOPA 25-100 MG PO TABS: ORAL_TABLET | ORAL | 1 refills | Status: DC

## 2018-08-07 NOTE — Assessment & Plan Note (Signed)
-  Patient is no longer on antihypertensive medications -Her lisinopril was discontinued on her last visit secondary to orthostatic hypotension -Patient confirms that she is not taking lisinopril at home -Her blood pressure is within normal limits today and she is not orthostatic -We will continue to monitor her off blood pressure medications

## 2018-08-07 NOTE — Progress Notes (Signed)
   Subjective:    Patient ID: Nicole Baxter, female    DOB: [birth year], 77 y.o.   MRN: [id number]  HPI  I have seen and examined this patient. She is here for hospital follow up for orthostatic hypotension.  Patient states that she feels well today.  She was recently seen in the ED for orthostatic hypotension for which she was given IV fluids.  Her blood work did not show any new abnormalities.  She does state that she is not been drinking or eating as well at home.  She has stopped taking the lisinopril which we have discontinued on her last visit.  She denies any other complaints at this time.  She states that she is compliant with all her medications.  Review of Systems  Constitutional: Negative.   HENT: Negative.   Respiratory: Negative.   Cardiovascular: Negative.   Gastrointestinal: Negative.   Musculoskeletal: Negative.   Neurological: Positive for tremors and light-headedness.  Psychiatric/Behavioral: Negative.        Objective:   Physical Exam  Constitutional: She appears well-developed.  HENT:  Head: Normocephalic and atraumatic.  Mouth/Throat: Oropharynx is clear and moist. No oropharyngeal exudate.  Neck: Neck supple.  Cardiovascular: Normal rate, regular rhythm and normal heart sounds. Exam reveals no friction rub.  No murmur heard. Pulmonary/Chest: Effort normal and breath sounds normal. She has no rales.  Abdominal: Soft. Bowel sounds are normal. She exhibits no distension. There is no tenderness.  Musculoskeletal: Normal range of motion. She exhibits no edema.  Lymphadenopathy:    She has no cervical adenopathy.  Psychiatric: She has a normal mood and affect. Her behavior is normal.          Assessment & Plan:  Please see problem based charting for assessment and plan:

## 2018-08-07 NOTE — Assessment & Plan Note (Signed)
-  This problem is chronic and stable -Patient follows up with Dr. Daisy Blossom for this -She had her Sinemet dose changed at her last visit -She is to follow-up with Dr. Daisy Blossom early next year -She currently does have mild tremors at rest -We will refill her Sinemet today as they are running out -Patient also with a history of falls including one recently likely secondary to underlying parkinsonism.  I believe that she will benefit from physical therapy -We will refer her to physical therapy today -No further work-up at this time

## 2018-08-07 NOTE — Assessment & Plan Note (Signed)
-  Patient has had multiple episodes of orthostatic hypotension and was recently seen in the ED for this -Patient received IV fluids and her blood work was unremarkable at the time -Today her blood pressure is within normal limits and she is not orthostatic -We will continue to monitor off all blood pressure medications -I suspect orthostasis secondary to her progressively worsening appetite with decreased oral intake possibly secondary to underlying parkinsonism -Patient encouraged to keep well-hydrated -We will follow-up with the patient in a month

## 2018-08-07 NOTE — Assessment & Plan Note (Addendum)
-  Flu shot given today -We also discussed the importance of having advanced directives and we will schedule an appointment 1 month from now for this.  I have given the patient and her husband reading materials about advanced directives and have encouraged the patient to bring her daughter and/or son to the appointment.

## 2018-08-07 NOTE — Patient Instructions (Signed)
-  It was a pleasure seeing you today -I have refilled your Sinemet today -Your blood pressure is stable today and it is not decreasing when you stand up.  Please keep yourself hydrated and continue to eat well. -We will discuss your advanced directives on your follow-up visit.  Please bring your daughter and/or son to your appointment as well so we can have a family discussion about this -We will check some blood work on your follow-up visit -We will give you a flu shot today

## 2018-08-14 ENCOUNTER — Ambulatory Visit: Payer: PPO | Admitting: Internal Medicine

## 2018-08-16 ENCOUNTER — Ambulatory Visit (HOSPITAL_COMMUNITY): Payer: PPO

## 2018-08-22 ENCOUNTER — Telehealth (HOSPITAL_COMMUNITY): Payer: Self-pay | Admitting: Internal Medicine

## 2018-08-22 ENCOUNTER — Ambulatory Visit (HOSPITAL_COMMUNITY): Payer: PPO | Attending: Internal Medicine | Admitting: Physical Therapy

## 2018-08-22 ENCOUNTER — Other Ambulatory Visit: Payer: Self-pay

## 2018-08-22 ENCOUNTER — Encounter (HOSPITAL_COMMUNITY): Payer: Self-pay | Admitting: Physical Therapy

## 2018-08-22 DIAGNOSIS — M6281 Muscle weakness (generalized): Secondary | ICD-10-CM | POA: Insufficient documentation

## 2018-08-22 DIAGNOSIS — Z9181 History of falling: Secondary | ICD-10-CM | POA: Diagnosis not present

## 2018-08-22 DIAGNOSIS — R2681 Unsteadiness on feet: Secondary | ICD-10-CM | POA: Insufficient documentation

## 2018-08-22 NOTE — Patient Instructions (Addendum)
Functional Quadriceps: Sit to Stand    Sit on edge of chair, feet flat on floor. Stand upright, extending knees fully. Repeat 10____ times per set. Do __1__ sets per session. Do __2__ sessions per day.  http://orth.exer.us/734   Copyright  VHI. All rights reserved.  Heel Raise: Bilateral (Standing)   Holding onto a counter Rise on balls of feet. Repeat _10___ times per set. Do _1___ sets per session. Do __2__ sessions per day.  http://orth.exer.us/38   Copyright  VHI. All rights reserved.  Bridging    Slowly raise buttocks from floor, keeping stomach tight. Repeat _10___ times per set. Do __1__ sets per session. Do 2____ sessions per day.  http://orth.exer.us/1096   Copyright  VHI. All rights reserved.  Marching in Place: Varied Surfaces   Holding onto the counter  March in place, slowly lifting knees toward ceiling. Repeat ___10_ times per session. Do 1____ sessions per day. Repeat __2__ times with eyes closed. Repeat on compliant surface: ________.  Copyright  VHI. All rights reserved.  Strengthening: Straight Leg Raise (Phase 1)    Tighten muscles on front of right thigh, then lift leg __15__ inches from surface, keeping knee locked.  Repeat __10__ times per set. Do _1___ sets per session. Do ___2_ sessions per day.  http://orth.exer.us/614   Copyright  VHI. All rights reserved.

## 2018-08-22 NOTE — Telephone Encounter (Signed)
08/22/18  husband called to say to cx tomorrow's appt... he has two appts himself and he can't bring her here

## 2018-08-22 NOTE — Therapy (Signed)
[facility] [address] [place], [place], [address] Phone: [phone]   Fax:  [phone]  Physical Therapy Evaluation  Patient Details  Name: Nicole Baxter MRN: [id number] Date of Birth: [birth year] Referring Provider: Earl Baxter    Encounter Date: 08/22/2018  PT End of Session - 08/22/18 1320    Visit Number  1    Number of Visits  6    Date for PT Re-Evaluation  09/21/18    Authorization Type  [employer]     PT Start Time  1045   PT 15 minutes late    PT Stop Time  1115    PT Time Calculation (min)  30 min    Activity Tolerance  Patient tolerated treatment well    Behavior During Therapy  [facility] for tasks assessed/performed       Past Medical History:  Diagnosis Date  . Arthritis   . Dementia   . GERD (gastroesophageal reflux disease)    pt. states she no longer has GERD (09/11/15)  . Hyperglycemia    isolated FBS 122, 95, 104 (Random 138)  . Hyperlipidemia    on pravastatin  . Hypertension    controlled on 2 agents  . 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 Obesity   . Parkinson's disease (tremor, stiffness, slow motion, unstable posture) (HCC)   . Shortness of breath dyspnea    pt. states that she no longer has SOB )09/11/15  . Tremor     Past Surgical History:  Procedure Laterality Date  . CESAREAN SECTION    . COLONOSCOPY    . TOTAL KNEE ARTHROPLASTY Left 09/23/2015   Procedure: LEFT TOTAL KNEE ARTHROPLASTY;  Surgeon: Nicole Ave, MD;  [facility]: [facility] OR;  Service: Orthopedics;  Laterality: Left;    There were no vitals filed for this visit.   Subjective Assessment - 08/22/18 1048    Subjective  Pt states that she has not been completing her HEP.  States that she did not even know that she had any exercises.  PT is accompanied with her husband but he is on the phone during this time of the evaluation therefore it is uncertain whether pt has her HEP or not.     Pertinent History   dementia,  parkinsons.     How long can you sit comfortably?  no problem     How long can you stand comfortably?  timed one minute     How long can you walk comfortably?  needs physical assist.      Currently in Pain?  No/denies         [facility] PT Assessment - 08/22/18 0001      Assessment   Medical Diagnosis  frequent falls     Referring Provider  Nicole Baxter     Onset Date/Surgical Date  12/09/17    Next MD Visit  unknown    Prior Therapy  seen at this clinic from January to 02/22/2018      Precautions   Precautions  Fall      Restrictions   Weight Bearing Restrictions  No      Balance Screen   Has the patient fallen in the past 6 months  Yes    How many times?  3    Has the patient had a decrease in activity level because of a fear of falling?   Yes    Is the patient reluctant to leave their home  because of a fear of falling?   Yes      Home Environment   Living Environment  Private residence      Prior Function   Level of Independence  Needs assistance with gait    Vocation  Retired      IT consultant   Overall Cognitive Status  History of cognitive impairments - at baseline      Functional Tests   Functional tests  Single leg stance;Sit to Stand      Single Leg Stance   Comments  not able B       Sit to Stand   Comments  attempted 5 x after two pt stopped talking about her nail.      ROM / Strength   AROM / PROM / Strength  Strength      Strength   Strength Assessment Site  Hip;Knee    Right/Left Hip  Right;Left    Right Hip Flexion  4/5    Right Hip Extension  3-/5    Right Hip ABduction  4/5    Left Hip Flexion  4+/5    Left Hip Extension  3/5    Left Hip ABduction  4-/5    Right/Left Knee  Right;Left    Right Knee Extension  5/5    Left Knee Extension  5/5      Ambulation/Gait   Ambulation/Gait  Yes    Ambulation/Gait Assistance  3: Mod assist    Ambulation Distance (Feet)  50 Feet    Assistive device  Straight cane    Gait velocity  slow      Gait Comments  decreased step length                 Objective measurements completed on examination: See above findings.              PT Education - 08/22/18 1318    Education Details  Instructed the husband that he must do the exercises with the patient he can not tell her to do them he must be physically involved.     Person(s) Educated  Patient    Methods  Explanation    Comprehension  Verbalized understanding;Returned demonstration       PT Short Term Goals - 08/22/18 1342      PT SHORT TERM GOAL #1   Title  Pt will be able to stand for 2 minutes without wanting to sit down for improved safety in the house     Time  10    Period  Days    Status  New    Target Date  08/31/18      PT SHORT TERM GOAL #2   Title  Pt to be able to single leg stance for 5 seconds B to reduce risk of falls     Time  10    Period  Weeks    Status  New        PT Long Term Goals - 08/22/18 1343      PT LONG TERM GOAL #1   Title  PT to be able to tolerate standing for 5 minutes to be able to complete self grooming activities.    Time  3    Period  Weeks    Status  New    Target Date  09/12/18      PT LONG TERM GOAL #2   Title  Pt to be able to walk with minimum assist and her cane for  300 feet for improved ease of going to medical appointments.     Time  3    Period  Weeks    Status  New             Plan - 08/22/18 1327    Clinical Impression Statement  Nicole Baxter is a known patient to this facility.  She was seen for 16 visits with her last session being 02/22/2018.  She is now being referred for the same condition of unstable gait with history of falling.  When questioned the patient and her husband have not been doing the home exercise program.  This therapist explained to the husband that due to his wife's multiple medical conditions he will need to be an active participant in his wifes exercises and can not just tell her that she needs to do her exercises.   The husband verbally agreed that he would be doing so therefore we will pick Nicole Baxter up for a short period to refresh the family on activities and exercises that will be benefical for the patient to keep her ambulatory for as long as possible.  Evaluation demonstrates an unsteady gait with decreased velocity and step length, decreased balance, decreased activity tolerance and decreased strength.  Ms. Blann will benefit from skilled physical therapy  to address these issues and develop a home program .    History and Personal Factors relevant to plan of care:  OA, dementia, parkinsons    Clinical Presentation  Evolving    Clinical Decision Making  Moderate    Rehab Potential  Fair    PT Frequency  2x / week    PT Duration  3 weeks    PT Treatment/Interventions  ADLs/Self Care Home Management;Therapeutic exercise;Therapeutic activities;Balance training;Neuromuscular re-education;Patient/family education    PT Next Visit Plan  Gait training with cane; Marching in place adding opposite arm raise when able, Cone rotation, tandem stance , sit to stand, oppositearm/leg.      PT Home Exercise Plan  heel raise, marching, bridge, SLR, hip abduction.    Consulted and Agree with Plan of Care  Patient       Patient will benefit from skilled therapeutic intervention in order to improve the following deficits and impairments:  Abnormal gait, Decreased activity tolerance, Decreased balance, Decreased coordination, Difficulty walking, Decreased strength, Postural dysfunction, Decreased endurance  Visit Diagnosis: Unsteadiness on feet - Plan: PT plan of care cert/re-cert  History of falling - Plan: PT plan of care cert/re-cert  Muscle weakness (generalized) - Plan: PT plan of care cert/re-cert     Problem List Patient Active Problem List   Diagnosis Date Noted  . Hypotension 07/16/2018  . Fatigue 06/05/2018  . Mild intermittent asthma, uncomplicated 10/31/2017  . Weight loss, unintentional 07/11/2017   . Lightheadedness 06/16/2017  . Chronic bilateral low back pain without sciatica 12/19/2016  . 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 Hypertensive retinopathy of both eyes, grade 2 01/15/2016  . 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 History of colon polyps 12/25/2014  . Health care maintenance 02/13/2014  . Subclinical hypothyroidism 12/25/2013  . Right renal mass 06/11/2012  . Osteoarthritis of both knees 03/15/2012  . Parkinsonism (HCC) 07/13/2010  . ALLERGIC RHINITIS, SEASONAL 02/20/2007  . Hyperlipidemia 09/27/2006  . Essential hypertension 09/27/2006  . GERD 09/27/2006    Virgina Organ, PT CLT [phone] 08/22/2018, 1:50 PM  [facility] [address] [place], [place], [address] Phone: [phone]  Fax:  [phone]  Name: Nicole Baxter MRN: [id number] Date of Birth: [birth year]

## 2018-08-23 ENCOUNTER — Ambulatory Visit (HOSPITAL_COMMUNITY): Payer: PPO

## 2018-08-24 ENCOUNTER — Other Ambulatory Visit: Payer: Self-pay | Admitting: Internal Medicine

## 2018-08-24 DIAGNOSIS — M170 Bilateral primary osteoarthritis of knee: Secondary | ICD-10-CM

## 2018-08-27 NOTE — Telephone Encounter (Signed)
Next appt scheduled 10/8 with PCP. 

## 2018-08-28 ENCOUNTER — Ambulatory Visit (HOSPITAL_COMMUNITY): Payer: PPO | Attending: Internal Medicine | Admitting: Physical Therapy

## 2018-08-28 DIAGNOSIS — Z9181 History of falling: Secondary | ICD-10-CM | POA: Insufficient documentation

## 2018-08-28 DIAGNOSIS — M6281 Muscle weakness (generalized): Secondary | ICD-10-CM | POA: Insufficient documentation

## 2018-08-28 DIAGNOSIS — R2681 Unsteadiness on feet: Secondary | ICD-10-CM | POA: Diagnosis not present

## 2018-08-28 NOTE — Therapy (Signed)
[facility] [address] [place], [place], [address] Phone: [phone]   Fax:  [phone]  Physical Therapy Treatment  Patient Details  Name: Nicole Baxter MRN: [id number] Date of Birth: [birth year] Referring Provider (PT): [name]    Encounter Date: 08/28/2018  PT End of Session - 08/28/18 0952    Visit Number  2    Number of Visits  6    Date for PT Re-Evaluation  09/21/18    Authorization Type  [employer]     PT Start Time  0902    PT Stop Time  0945    PT Time Calculation (min)  43 min    Activity Tolerance  Patient tolerated treatment well    Behavior During Therapy  [facility] for tasks assessed/performed       Past Medical History:  Diagnosis Date  . Arthritis   . Dementia   . GERD (gastroesophageal reflux disease)    pt. states she no longer has GERD (09/11/15)  . Hyperglycemia    isolated FBS 122, 95, 104 (Random 138)  . Hyperlipidemia    on pravastatin  . Hypertension    controlled on 2 agents  . 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 Obesity   . Parkinson's disease (tremor, stiffness, slow motion, unstable posture) (HCC)   . Shortness of breath dyspnea    pt. states that she no longer has SOB )09/11/15  . Tremor     Past Surgical History:  Procedure Laterality Date  . CESAREAN SECTION    . COLONOSCOPY    . TOTAL KNEE ARTHROPLASTY Left 09/23/2015   Procedure: LEFT TOTAL KNEE ARTHROPLASTY;  Surgeon: Loreta Ave, MD;  Location: [facility] OR;  Service: Orthopedics;  Laterality: Left;    There were no vitals filed for this visit.  Subjective Assessment - 08/28/18 0910    Subjective  PT states that she has done some of the exercises over the weekend     Pertinent History  dementia, probable parkinsons.     How long can you sit comfortably?  no problem     How long can you stand comfortably?  timed one minute     How long can you walk comfortably?  needs physical assist.      Currently in Pain?  No/denies           [facility] Adult PT Treatment/Exercise - 08/28/18 0001      Ambulation/Gait   Ambulation/Gait  Yes    Ambulation/Gait Assistance  4: Min assist    Ambulation Distance (Feet)  100 Feet    Assistive device  Straight cane    Gait velocity  slow     Gait Comments  decreased step length       Exercises   Exercises  Knee/Hip      Knee/Hip Exercises: Standing   Other Standing Knee Exercises  PWR UP, PWR shift, PWR step       Knee/Hip Exercises: Seated   Marching  10 reps    Sit to Sand  10 reps          Balance Exercises - 08/28/18 0931      Balance Exercises: Standing   Tandem Stance  Eyes open;3 reps    Sidestepping  1 rep    Cone Rotation  Solid surface;Right turn;Left turn          PT Short Term Goals - 08/28/18 0954      PT SHORT  TERM GOAL #1   Title  Pt will be able to stand for 2 minutes without wanting to sit down for improved safety in the house     Time  10    Period  Days    Status  On-going      PT SHORT TERM GOAL #2   Title  Pt to be able to single leg stance for 5 seconds B to reduce risk of falls     Time  10    Period  Weeks    Status  On-going        PT Long Term Goals - 08/28/18 7829      PT LONG TERM GOAL #1   Title  PT to be able to tolerate standing for 5 minutes to be able to complete self grooming activities.    Time  3    Period  Weeks    Status  On-going      PT LONG TERM GOAL #2   Title  Pt to be able to walk with minimum assist and her cane for 300 feet for improved ease of going to medical appointments.     Time  3    Period  Weeks    Status  On-going            Plan - 08/28/18 5621    Clinical Impression Statement  Therapist reviewed evaluation and goals with patient.  Began closed chain exercises focusing on weight shifting and balance.  Pt needed verbal and tactile cuing to complete exercises .    Rehab Potential  Fair    PT Frequency  2x / week    PT Duration  3 weeks     PT Treatment/Interventions  ADLs/Self Care Home Management;Therapeutic exercise;Therapeutic activities;Balance training;Neuromuscular re-education;Patient/family education    PT Next Visit Plan  Continue working on [employer] and [address] as well as gait training.     PT Home Exercise Plan  heel raise, marching, bridge, SLR, hip abduction.    Consulted and Agree with Plan of Care  Patient       Patient will benefit from skilled therapeutic intervention in order to improve the following deficits and impairments:  Abnormal gait, Decreased activity tolerance, Decreased balance, Decreased coordination, Difficulty walking, Decreased strength, Postural dysfunction, Decreased endurance  Visit Diagnosis: Unsteadiness on feet  History of falling  Muscle weakness (generalized)     Problem List Patient Active Problem List   Diagnosis Date Noted  . Hypotension 07/16/2018  . Fatigue 06/05/2018  . Mild intermittent asthma, uncomplicated 10/31/2017  . Weight loss, unintentional 07/11/2017  . Lightheadedness 06/16/2017  . Chronic bilateral low back pain without sciatica 12/19/2016  . 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 Hypertensive retinopathy of both eyes, grade 2 01/15/2016  . 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 History of colon polyps 12/25/2014  . Health care maintenance 02/13/2014  . Subclinical hypothyroidism 12/25/2013  . Right renal mass 06/11/2012  . Osteoarthritis of both knees 03/15/2012  . Parkinsonism (HCC) 07/13/2010  . ALLERGIC RHINITIS, SEASONAL 02/20/2007  . Hyperlipidemia 09/27/2006  . Essential hypertension 09/27/2006  . GERD 09/27/2006    Virgina Organ, PT CLT [phone] 08/28/2018, 9:55 AM  [facility] [address] [place], [place], [address] Phone: [phone]   Fax:  [phone]  Name: Nicole Baxter MRN: [id number] Date of Birth: 1941-08-20

## 2018-08-29 ENCOUNTER — Other Ambulatory Visit: Payer: Self-pay | Admitting: Internal Medicine

## 2018-08-29 DIAGNOSIS — M170 Bilateral primary osteoarthritis of knee: Secondary | ICD-10-CM

## 2018-08-31 ENCOUNTER — Ambulatory Visit (HOSPITAL_COMMUNITY): Payer: PPO

## 2018-08-31 ENCOUNTER — Encounter (HOSPITAL_COMMUNITY): Payer: Self-pay

## 2018-08-31 DIAGNOSIS — R2681 Unsteadiness on feet: Secondary | ICD-10-CM | POA: Diagnosis not present

## 2018-08-31 DIAGNOSIS — Z9181 History of falling: Secondary | ICD-10-CM

## 2018-08-31 DIAGNOSIS — M6281 Muscle weakness (generalized): Secondary | ICD-10-CM

## 2018-08-31 NOTE — Therapy (Signed)
[facility] [address] [place], [place], [address] Phone: [phone]   Fax:  [phone]  Physical Therapy Treatment  Patient Details  Name: Nicole Baxter MRN: [id number] Date of Birth: 1941/01/02 Referring Provider (PT): [name]    Encounter Date: 08/31/2018  PT End of Session - 08/31/18 0857    Visit Number  3    Number of Visits  6    Date for PT Re-Evaluation  09/21/18    Authorization Type  [employer]     PT Start Time  0901    PT Stop Time  0945    PT Time Calculation (min)  44 min    Activity Tolerance  Patient tolerated treatment well    Behavior During Therapy  [facility] for tasks assessed/performed       Past Medical History:  Diagnosis Date  . Arthritis   . Dementia (HCC)   . GERD (gastroesophageal reflux disease)    pt. states she no longer has GERD (09/11/15)  . Hyperglycemia    isolated FBS 122, 95, 104 (Random 138)  . Hyperlipidemia    on pravastatin  . Hypertension    controlled on 2 agents  . 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 Obesity   . Parkinson's disease (tremor, stiffness, slow motion, unstable posture) (HCC)   . Shortness of breath dyspnea    pt. states that she no longer has SOB )09/11/15  . Tremor     Past Surgical History:  Procedure Laterality Date  . CESAREAN SECTION    . COLONOSCOPY    . TOTAL KNEE ARTHROPLASTY Left 09/23/2015   Procedure: LEFT TOTAL KNEE ARTHROPLASTY;  Surgeon: Loreta Ave, MD;  Location: [facility] OR;  Service: Orthopedics;  Laterality: Left;    There were no vitals filed for this visit.  Subjective Assessment - 08/31/18 0857    Subjective  Patient states she has done exercises some at home since last session.     Pertinent History  dementia, probable parkinsons.     How long can you sit comfortably?  no problem     How long can you stand comfortably?  timed one minute     How long can you walk comfortably?  needs physical assist.       Currently in Pain?  No/denies                       [facility] Adult PT Treatment/Exercise - 08/31/18 0001      Ambulation/Gait   Ambulation/Gait  Yes    Ambulation/Gait Assistance  4: Min assist    Ambulation Distance (Feet)  100 Feet    Assistive device  Straight cane    Gait velocity  slow     Gait Comments  decreased step length  and arm swing      Neuro Re-ed    Neuro Re-ed Details          Knee/Hip Exercises: Standing   Other Standing Knee Exercises  PWR UP, PWR shift, PWR step       Knee/Hip Exercises: Seated   Marching  10 reps    Sit to Sand  10 reps          Balance Exercises - 08/31/18 0944      Balance Exercises: Standing   Tandem Stance  Eyes open;3 reps;30 secs    Cone Rotation  Solid surface;Right turn;Left turn  PT Short Term Goals - 08/28/18 0954      PT SHORT TERM GOAL #1   Title  Pt will be able to stand for 2 minutes without wanting to sit down for improved safety in the house     Time  10    Period  Days    Status  On-going      PT SHORT TERM GOAL #2   Title  Pt to be able to single leg stance for 5 seconds B to reduce risk of falls     Time  10    Period  Weeks    Status  On-going        PT Long Term Goals - 08/28/18 1610      PT LONG TERM GOAL #1   Title  PT to be able to tolerate standing for 5 minutes to be able to complete self grooming activities.    Time  3    Period  Weeks    Status  On-going      PT LONG TERM GOAL #2   Title  Pt to be able to walk with minimum assist and her cane for 300 feet for improved ease of going to medical appointments.     Time  3    Period  Weeks    Status  On-going            Plan - 08/31/18 0857    Clinical Impression Statement  Continued with established POC and continued with closed chain exercises focusing on weight shifting, balance and crossing midline. Patient needed verbal and tactile cuing to complete exercises. Spouse present during treatment session.  Continue with current plan, progress as able, ensure HEP completed regularly with assistance from spouse.    Rehab Potential  Fair    PT Frequency  2x / week    PT Duration  3 weeks    PT Treatment/Interventions  ADLs/Self Care Home Management;Therapeutic exercise;Therapeutic activities;Balance training;Neuromuscular re-education;Patient/family education    PT Next Visit Plan  Continue working on [employer] and [address] as well as gait training.     PT Home Exercise Plan  heel raise, marching, bridge, SLR, hip abduction.    Consulted and Agree with Plan of Care  Patient       Patient will benefit from skilled therapeutic intervention in order to improve the following deficits and impairments:  Abnormal gait, Decreased activity tolerance, Decreased balance, Decreased coordination, Difficulty walking, Decreased strength, Postural dysfunction, Decreased endurance  Visit Diagnosis: Unsteadiness on feet  History of falling  Muscle weakness (generalized)     Problem List Patient Active Problem List   Diagnosis Date Noted  . Hypotension 07/16/2018  . Fatigue 06/05/2018  . Mild intermittent asthma, uncomplicated 10/31/2017  . Weight loss, unintentional 07/11/2017  . Lightheadedness 06/16/2017  . Chronic bilateral low back pain without sciatica 12/19/2016  . 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 Hypertensive retinopathy of both eyes, grade 2 01/15/2016  . 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 History of colon polyps 12/25/2014  . Health care maintenance 02/13/2014  . Subclinical hypothyroidism 12/25/2013  . Right renal mass 06/11/2012  . Osteoarthritis of both knees 03/15/2012  . Parkinsonism (HCC) 07/13/2010  . ALLERGIC RHINITIS, SEASONAL 02/20/2007  . Hyperlipidemia 09/27/2006  . Essential hypertension 09/27/2006  . GERD 09/27/2006    Katina Dung. Hartnett-Rands, MS, PT Per Ladoris Gene [facility] #[id number] 08/31/2018, 12:43  PM  [facility]  [address] [place], [place], [address] Phone: [phone]   Fax:  [phone]  Name: Nicole Baxter MRN: [id number] Date of Birth: 05-24-1941

## 2018-09-04 ENCOUNTER — Telehealth: Payer: Self-pay | Admitting: *Deleted

## 2018-09-04 ENCOUNTER — Ambulatory Visit: Payer: PPO | Admitting: Internal Medicine

## 2018-09-04 ENCOUNTER — Encounter: Payer: Self-pay | Admitting: Internal Medicine

## 2018-09-04 VITALS — BP 113/61 | HR 72 | Temp 97.5°F | Ht 61.5 in | Wt 115.3 lb

## 2018-09-04 DIAGNOSIS — R634 Abnormal weight loss: Secondary | ICD-10-CM

## 2018-09-04 DIAGNOSIS — Z0000 Encounter for general adult medical examination without abnormal findings: Secondary | ICD-10-CM

## 2018-09-04 DIAGNOSIS — I10 Essential (primary) hypertension: Secondary | ICD-10-CM

## 2018-09-04 DIAGNOSIS — G20 Parkinson's disease: Secondary | ICD-10-CM

## 2018-09-04 NOTE — Telephone Encounter (Addendum)
Call to Chilton Si and Sarajane Jews children of patient to set up an appointment to talk about patient's condition and plan for care.  Spoke with Sarajane Jews who was given the appointment for 11/06/2018  at 10:15 AM.  Voiced understanding of the plan.  Message left for [name]  to call the Clinics about her mother's upcoming appointment in December to discuss her care and condition.  Angelina Ok, RN 09/04/2018 3:58 PM   RTC from patient's daughter [name].  Informed of appointment for November 06, 2018 at 10:15 AM.  Daughter stated she was unaware that she needed to come to yesterday for the appointment.  Clear  on reasons for the upcoming appointment.  Angelina Ok, RN 09/05/2018 3;55 PM.

## 2018-09-04 NOTE — Progress Notes (Signed)
   Subjective:    Patient ID: Nicole Baxter, female    DOB: 04-20-1941, 77 y.o.   MRN: [id number]  HPI  I have seen and examined this patient.  Patient feels well today with no new complaints.  Patient was initially scheduled today to have an advanced directive discussion with her husband as well as her 2 children.  Her children were unable to make the appointment today.  Will reschedule this appointment for them for my next available appointment and asked them to bring the children to the appointment.   Review of Systems  Constitutional: Negative.   HENT: Negative.   Respiratory: Negative.   Cardiovascular: Negative.   Gastrointestinal: Negative.   Musculoskeletal: Negative.   Neurological: Positive for headaches.       Patient currently complaining of mild pain over her left forehead  Psychiatric/Behavioral: Negative.        Objective:   Physical Exam  Constitutional: She appears well-developed and well-nourished.  HENT:  Head: Normocephalic and atraumatic.  Mouth/Throat: No oropharyngeal exudate.  Neck: Neck supple.  Cardiovascular: Normal rate, regular rhythm and normal heart sounds.  Pulmonary/Chest: Effort normal and breath sounds normal. No respiratory distress. She has no wheezes. She has no rales.  Abdominal: Soft. Bowel sounds are normal. She exhibits no distension. There is no tenderness.  Musculoskeletal: Normal range of motion. She exhibits no edema.  Lymphadenopathy:    She has no cervical adenopathy.  Psychiatric: She has a normal mood and affect. Her behavior is normal.          Assessment & Plan:  Please see problem based charting for assessment and plan:

## 2018-09-04 NOTE — Patient Instructions (Addendum)
-  It was a pleasure seeing you today -Please follow-up at my next available appointment for an advanced directives discussion.  Please bring either one or both of your children to the appointment -Your blood pressure is doing great.  Keep up the great work -Please follow-up with your neurologist in February

## 2018-09-04 NOTE — Assessment & Plan Note (Signed)
-  Patient to have an advanced care directive discussion with me as well as her family on her next visit -We will attempt to call children prior to the visits to ensure that they can make the appointment -No further work-up at this time

## 2018-09-04 NOTE — Assessment & Plan Note (Signed)
-  Patient has history of parkinsonism -She is compliant with Sinemet and states that her symptoms are under control -She will follow-up with Dr. Daisy Blossom in February -No further work-up at this time

## 2018-09-04 NOTE — Assessment & Plan Note (Signed)
-  Patient blood pressure is well controlled off all medications -She denies any episodes of lightheadedness or dizziness or falls -We will refer her to ophthalmology for routine eye check up given her history of hypertension as well as occasional left eye pain -No further work-up at this time

## 2018-09-04 NOTE — Assessment & Plan Note (Signed)
-  This problem is chronic and stable -Patient's weight has averaged between 117 to 120 pounds over her last few visits which has decreased from 130 pounds from the beginning of the year -Patient states that she has a poor appetite but that she does try and eat her meals -We will continue to monitor her weight closely but I suspect this is likely secondary to her underlying parkinsonism -If she continues to lose weight would consider secondary work-up for underlying malignancy

## 2018-09-05 ENCOUNTER — Encounter (HOSPITAL_COMMUNITY): Payer: Self-pay

## 2018-09-05 ENCOUNTER — Ambulatory Visit (HOSPITAL_COMMUNITY): Payer: PPO

## 2018-09-05 DIAGNOSIS — Z9181 History of falling: Secondary | ICD-10-CM

## 2018-09-05 DIAGNOSIS — R2681 Unsteadiness on feet: Secondary | ICD-10-CM

## 2018-09-05 DIAGNOSIS — M6281 Muscle weakness (generalized): Secondary | ICD-10-CM

## 2018-09-05 NOTE — Therapy (Signed)
[facility] [address] [place], [place], [address] Phone: [phone]   Fax:  [phone]  Physical Therapy Treatment  Patient Details  Name: Nicole Baxter MRN: [id number] Date of Birth: [birth year] Referring Provider (PT): [name]    Encounter Date: 09/05/2018  PT End of Session - 09/05/18 0931    Visit Number  4    Number of Visits  6    Date for PT Re-Evaluation  09/12/18    Authorization Type  [employer]     Authorization Time Period  cert: 93/71-->69/67/89    PT Start Time  0928   5' on Nustep for coordination, not included wiht charges   PT Stop Time  1015    PT Time Calculation (min)  47 min    Equipment Utilized During Treatment  Gait belt    Activity Tolerance  Patient tolerated treatment well;Patient limited by fatigue    Behavior During Therapy  [facility] for tasks assessed/performed       Past Medical History:  Diagnosis Date  . Arthritis   . Dementia (HCC)   . GERD (gastroesophageal reflux disease)    pt. states she no longer has GERD (09/11/15)  . Hyperglycemia    isolated FBS 122, 95, 104 (Random 138)  . Hyperlipidemia    on pravastatin  . Hypertension    controlled on 2 agents  . 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 Obesity   . Parkinson's disease (tremor, stiffness, slow motion, unstable posture) (HCC)   . Shortness of breath dyspnea    pt. states that she no longer has SOB )09/11/15  . Tremor     Past Surgical History:  Procedure Laterality Date  . CESAREAN SECTION    . COLONOSCOPY    . TOTAL KNEE ARTHROPLASTY Left 09/23/2015   Procedure: LEFT TOTAL KNEE ARTHROPLASTY;  Surgeon: Loreta Ave, MD;  Location: [facility];  Service: Orthopedics;  Laterality: Left;    There were no vitals filed for this visit.  Subjective Assessment - 09/05/18 0922    Subjective  No reports of pain or recent fall.  Has been doing some exercises at home since last session.      Pertinent  History  dementia, probable parkinsons.     Currently in Pain?  No/denies                       [facility] Adult PT Treatment/Exercise - 09/05/18 0001      Ambulation/Gait   Ambulation/Gait  Yes    Ambulation/Gait Assistance  4: Min guard    Ambulation Distance (Feet)  200 Feet   1 standing rest break   Assistive device  Straight cane    Gait velocity  slow     Gait Comments  decreased step length  and arm swing; cueing for bigger step length      Exercises   Exercises  Knee/Hip      Knee/Hip Exercises: Aerobic   Nustep  UE/LE for coordination x5'      Knee/Hip Exercises: Standing   Other Standing Knee Exercises  PWR UP, PWR shift, PWR step       Knee/Hip Exercises: Seated   Sit to Sand  10 reps          Balance Exercises - 09/05/18 1018      Balance Exercises: Standing   Sidestepping  3 reps   front of mat   Cone Rotation  Solid surface;Right turn;Left turn          PT Short Term Goals - 08/28/18 0954      PT SHORT TERM GOAL #1   Title  Pt will be able to stand for 2 minutes without wanting to sit down for improved safety in the house     Time  10    Period  Days    Status  On-going      PT SHORT TERM GOAL #2   Title  Pt to be able to single leg stance for 5 seconds B to reduce risk of falls     Time  10    Period  Weeks    Status  On-going        PT Long Term Goals - 08/28/18 0981      PT LONG TERM GOAL #1   Title  PT to be able to tolerate standing for 5 minutes to be able to complete self grooming activities.    Time  3    Period  Weeks    Status  On-going      PT LONG TERM GOAL #2   Title  Pt to be able to walk with minimum assist and her cane for 300 feet for improved ease of going to medical appointments.     Time  3    Period  Weeks    Status  On-going            Plan - 09/05/18 1027    Clinical Impression Statement  Added Nustep at beginning of session to improve coordination wiht gait.  Continued session foucs  with PWR! and BIG gait techniques to enhance balance training to improve weight shifting, crossing midline and improve UE/LE coordination.  Pt continues to required cueing wiht gait to improve posture and increase stride length to reduce shuffling.  Added sidestep for gluteal strengthening and balance.  EOS pt limited by fatigue.      Rehab Potential  Fair    PT Frequency  2x / week    PT Duration  3 weeks    PT Treatment/Interventions  ADLs/Self Care Home Management;Therapeutic exercise;Therapeutic activities;Balance training;Neuromuscular re-education;Patient/family education    PT Next Visit Plan  Continue working on [employer] and [address] as well as gait training.     PT Home Exercise Plan  heel raise, marching, bridge, SLR, hip abduction.       Patient will benefit from skilled therapeutic intervention in order to improve the following deficits and impairments:  Abnormal gait, Decreased activity tolerance, Decreased balance, Decreased coordination, Difficulty walking, Decreased strength, Postural dysfunction, Decreased endurance  Visit Diagnosis: Unsteadiness on feet  History of falling  Muscle weakness (generalized)     Problem List Patient Active Problem List   Diagnosis Date Noted  . Hypotension 07/16/2018  . Fatigue 06/05/2018  . Mild intermittent asthma, uncomplicated 10/31/2017  . Weight loss, unintentional 07/11/2017  . Lightheadedness 06/16/2017  . Chronic bilateral low back pain without sciatica 12/19/2016  . 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 Hypertensive retinopathy of both eyes, grade 2 01/15/2016  . 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 History of colon polyps 12/25/2014  . Health care maintenance 02/13/2014  . Subclinical hypothyroidism 12/25/2013  . Right renal mass 06/11/2012  . Osteoarthritis of both knees 03/15/2012  . Parkinsonism (HCC) 07/13/2010  . ALLERGIC RHINITIS, SEASONAL 02/20/2007  . Hyperlipidemia  09/27/2006  . Essential hypertension 09/27/2006  . GERD 09/27/2006   Becky Sax,  LPTA; CBIS [phone]  Juel Burrow 09/05/2018, 10:32 AM  [facility] [address] [place], [place], [address] Phone: [phone]   Fax:  [phone]  Name: AMOR HYLE MRN: [id number] Date of Birth: 09-24-1941

## 2018-09-07 ENCOUNTER — Ambulatory Visit (HOSPITAL_COMMUNITY): Payer: PPO

## 2018-09-07 ENCOUNTER — Encounter (HOSPITAL_COMMUNITY): Payer: Self-pay

## 2018-09-07 DIAGNOSIS — R2681 Unsteadiness on feet: Secondary | ICD-10-CM | POA: Diagnosis not present

## 2018-09-07 DIAGNOSIS — Z9181 History of falling: Secondary | ICD-10-CM

## 2018-09-07 DIAGNOSIS — M6281 Muscle weakness (generalized): Secondary | ICD-10-CM

## 2018-09-07 NOTE — Therapy (Signed)
[facility] [address] [place], [place], [address] Phone: [phone]   Fax:  [phone]  Physical Therapy Treatment  Patient Details  Name: Nicole Baxter MRN: [id number] Date of Birth: 02-02-1941 Referring Provider (PT): [name]    Encounter Date: 09/07/2018  PT End of Session - 09/07/18 1042    Visit Number  5    Number of Visits  6    Date for PT Re-Evaluation  09/12/18    Authorization Type  [employer]     Authorization Time Period  cert: 69/62-->95/28/41    PT Start Time  1033    PT Stop Time  1118    PT Time Calculation (min)  45 min    Equipment Utilized During Treatment  Gait belt    Activity Tolerance  Patient tolerated treatment well;Patient limited by fatigue    Behavior During Therapy  [facility] for tasks assessed/performed       Past Medical History:  Diagnosis Date  . Arthritis   . Dementia (HCC)   . GERD (gastroesophageal reflux disease)    pt. states she no longer has GERD (09/11/15)  . Hyperglycemia    isolated FBS 122, 95, 104 (Random 138)  . Hyperlipidemia    on pravastatin  . Hypertension    controlled on 2 agents  . 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 Obesity   . Parkinson's disease (tremor, stiffness, slow motion, unstable posture) (HCC)   . Shortness of breath dyspnea    pt. states that she no longer has SOB )09/11/15  . Tremor     Past Surgical History:  Procedure Laterality Date  . CESAREAN SECTION    . COLONOSCOPY    . TOTAL KNEE ARTHROPLASTY Left 09/23/2015   Procedure: LEFT TOTAL KNEE ARTHROPLASTY;  Surgeon: Loreta Ave, MD;  Location: [facility] OR;  Service: Orthopedics;  Laterality: Left;    There were no vitals filed for this visit.  Subjective Assessment - 09/07/18 1120    Subjective  Pt stated she is feeling okay today, didn't realize she had therapy this morning.    Pertinent History  dementia, probable parkinsons.     Currently in Pain?  No/denies                        [facility] Adult PT Treatment/Exercise - 09/07/18 0001      Ambulation/Gait   Ambulation/Gait  Yes    Ambulation/Gait Assistance  4: Min guard    Ambulation Distance (Feet)  226 Feet   with SPC   Assistive device  Straight cane    Gait Comments  increased cueing to reduce shuffle gait mechanics, decreased step length and arm swing      Knee/Hip Exercises: Aerobic   Nustep  UE/LE for coordination x5'      Knee/Hip Exercises: Standing   Other Standing Knee Exercises  PWR UP, PWR shift, PWR step       Knee/Hip Exercises: Seated   Sit to Sand  10 reps          Balance Exercises - 09/07/18 1122      Balance Exercises: Standing   Step Over Hurdles / Cones  seated march over 6in hurdle and reach for cone    Cone Rotation  Solid surface;Right turn;Left turn    Other Standing Exercises  ladder drills to increase step length 2RT          PT Short  Term Goals - 08/28/18 0954      PT SHORT TERM GOAL #1   Title  Pt will be able to stand for 2 minutes without wanting to sit down for improved safety in the house     Time  10    Period  Days    Status  On-going      PT SHORT TERM GOAL #2   Title  Pt to be able to single leg stance for 5 seconds B to reduce risk of falls     Time  10    Period  Weeks    Status  On-going        PT Long Term Goals - 08/28/18 1610      PT LONG TERM GOAL #1   Title  PT to be able to tolerate standing for 5 minutes to be able to complete self grooming activities.    Time  3    Period  Weeks    Status  On-going      PT LONG TERM GOAL #2   Title  Pt to be able to walk with minimum assist and her cane for 300 feet for improved ease of going to medical appointments.     Time  3    Period  Weeks    Status  On-going            Plan - 09/07/18 1123    Clinical Impression Statement  Session focus on improving sequencing and coordination with gait and balance training.  Continued wiht PWR!, BIG gait and  balance training to improve weight shifting, crossing midline and coordination wiht UE/LE movements.  Increased cueing required this session to reduce shuffle gait and increase step length.  Added ladder drill activities to improve step length.      Rehab Potential  Fair    PT Frequency  2x / week    PT Duration  3 weeks    PT Treatment/Interventions  ADLs/Self Care Home Management;Therapeutic exercise;Therapeutic activities;Balance training;Neuromuscular re-education;Patient/family education    PT Next Visit Plan  Review goals next session.  Continue working on [employer] and [employer] as well as Investment banker, operational.     PT Home Exercise Plan  heel raise, marching, bridge, SLR, hip abduction.       Patient will benefit from skilled therapeutic intervention in order to improve the following deficits and impairments:  Abnormal gait, Decreased activity tolerance, Decreased balance, Decreased coordination, Difficulty walking, Decreased strength, Postural dysfunction, Decreased endurance  Visit Diagnosis: Unsteadiness on feet  History of falling  Muscle weakness (generalized)     Problem List Patient Active Problem List   Diagnosis Date Noted  . Hypotension 07/16/2018  . Fatigue 06/05/2018  . Mild intermittent asthma, uncomplicated 10/31/2017  . Weight loss, unintentional 07/11/2017  . Lightheadedness 06/16/2017  . Chronic bilateral low back pain without sciatica 12/19/2016  . 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 Hypertensive retinopathy of both eyes, grade 2 01/15/2016  . 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 History of colon polyps 12/25/2014  . Health care maintenance 02/13/2014  . Subclinical hypothyroidism 12/25/2013  . Right renal mass 06/11/2012  . Osteoarthritis of both knees 03/15/2012  . Parkinsonism (HCC) 07/13/2010  . ALLERGIC RHINITIS, SEASONAL 02/20/2007  . Hyperlipidemia 09/27/2006  . Essential hypertension 09/27/2006  . GERD  09/27/2006   Becky Sax, LPTA; CBIS [phone]  Juel Burrow 09/07/2018, 11:32 AM  [facility] [address]  [place], [place], [address] Phone: [phone]   Fax:  [phone]  Name: Nicole Baxter MRN: [id number] Date of Birth: 1940-12-06

## 2018-09-11 ENCOUNTER — Encounter (HOSPITAL_COMMUNITY): Payer: Self-pay | Admitting: Physical Therapy

## 2018-09-11 ENCOUNTER — Ambulatory Visit (HOSPITAL_COMMUNITY): Payer: PPO | Admitting: Physical Therapy

## 2018-09-11 DIAGNOSIS — R2681 Unsteadiness on feet: Secondary | ICD-10-CM

## 2018-09-11 DIAGNOSIS — Z9181 History of falling: Secondary | ICD-10-CM

## 2018-09-11 DIAGNOSIS — M6281 Muscle weakness (generalized): Secondary | ICD-10-CM

## 2018-09-11 NOTE — Therapy (Signed)
[facility] [address] [place], [place], [address] Phone: [phone]   Fax:  [phone]  Physical Therapy Treatment/Discharge   Patient Details  Name: Nicole Baxter MRN: [id number] Date of Birth: 1941/04/07 Referring Provider (PT): Aldine Contes    Encounter Date: 09/11/2018   PHYSICAL THERAPY DISCHARGE SUMMARY  Visits from Start of Care: 6  Current functional level related to goals / functional outcomes: See below    Remaining deficits: Balance/ strength    Education / Equipment: HEP Plan: Patient agrees to discharge.  Patient goals were partially met. Patient is being discharged due to                                                     ?????       PT discharged due to pt already being seen for 16 visits earlier this year for same dx.  Pt was discharged to HEP  And did not complete therefore she regressed.  Therapist explained that we can not teach her anything new she needs To complete HEP daily if she does not want to regress.  Pt and family understand    PT End of Session - 09/11/18 1030    Visit Number  6    Number of Visits  6    Date for PT Re-Evaluation  09/12/18    Authorization Type  [employer]     Authorization Time Period  cert: 19/37-->90/24/09    PT Start Time  0945    PT Stop Time  1030    PT Time Calculation (min)  45 min    Equipment Utilized During Treatment  Gait belt    Activity Tolerance  Patient tolerated treatment well;Patient limited by fatigue    Behavior During Therapy  WFL for tasks assessed/performed       Past Medical History:  Diagnosis Date  . Arthritis   . Dementia ([place])   . GERD (gastroesophageal reflux disease)    pt. states she no longer has GERD (09/11/15)  . Hyperglycemia    isolated FBS 122, 95, 104 (Random 138)  . Hyperlipidemia    on pravastatin  . Hypertension    controlled on 2 agents  . 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 Obesity    . Parkinson's disease (tremor, stiffness, slow motion, unstable posture) ([place])   . Shortness of breath dyspnea    pt. states that she no longer has SOB )09/11/15  . Tremor     Past Surgical History:  Procedure Laterality Date  . CESAREAN SECTION    . COLONOSCOPY    . TOTAL KNEE ARTHROPLASTY Left 09/23/2015   Procedure: LEFT TOTAL KNEE ARTHROPLASTY;  Surgeon: Ninetta Lights, MD;  Location: [facility];  Service: Orthopedics;  Laterality: Left;    There were no vitals filed for this visit.  Subjective Assessment - 09/11/18 0945    Subjective  PT states that she has been doing her exercises once a day.   She is tired today    Pertinent History  dementia, probable parkinsons.     How long can you sit comfortably?  no problem     How long can you stand comfortably?  timed  2:30    was one minute     How long can you walk comfortably?  less assist able to walk       in 3 minutes.     Currently in Pain?  No/denies         [facility] PT Assessment - 09/11/18 0001      Assessment   Medical Diagnosis  frequent falls     Referring Provider (PT)  Dareen Piano Nischal     Onset Date/Surgical Date  12/09/17    Next MD Visit  unknown    Prior Therapy  seen at this clinic from January to 02/22/2018      Precautions   Precautions  Fall      Restrictions   Weight Bearing Restrictions  No      Balance Screen   Has the patient fallen in the past 6 months  Yes    How many times?  3    Has the patient had a decrease in activity level because of a fear of falling?   Yes    Is the patient reluctant to leave their home because of a fear of falling?   Yes      [place] residence      Prior Function   Level of Independence  Needs assistance with gait    Vocation  Retired      Associate Professor   Overall Cognitive Status  History of cognitive impairments - at baseline      Functional Tests   Functional tests  Single leg stance;Sit to Stand      Single Leg Stance    Comments   5 seconds B inital eval pt was not able B       Sit to Stand   Comments  5 x 54.39 ; inital evaluation pt attempted 5 x after two pt stopped talking about her nail.      Strength   Right Hip Flexion  5/5   was 4    Right Hip Extension  3-/5   3-   Right Hip ABduction  4/5   was 4    Left Hip Flexion  5/5   was 4+   Left Hip Extension  3-/5    Left Hip ABduction  4/5   was 4-   Right Knee Extension  5/5    Left Knee Extension  5/5      Ambulation/Gait   Ambulation/Gait  Yes    Ambulation/Gait Assistance  5: Supervision    Ambulation Distance (Feet)  113 Feet    Assistive device  Straight cane    Gait velocity  slow     Gait Comments  3 minute walk test                    [facility] Adult PT Treatment/Exercise - 09/11/18 0001      Exercises   Exercises  Knee/Hip      Knee/Hip Exercises: Seated   Sit to Sand  5 reps      Knee/Hip Exercises: Supine   Bridges  10 reps      Knee/Hip Exercises: Sidelying   Hip ABduction  Both;5 reps               PT Short Term Goals - 09/11/18 0953      PT SHORT TERM GOAL #1   Title  Pt will be able to stand for 2 minutes without wanting to sit down for improved safety in the house     Time  10    Period  Days    Status  Achieved      PT SHORT TERM GOAL #2   Title  Pt to be able to single leg stance for 5 seconds B to reduce risk of falls     Time  10    Period  Weeks    Status  Achieved        PT Long Term Goals - 09/11/18 1856      PT LONG TERM GOAL #1   Title  PT to be able to tolerate standing for 5 minutes to be able to complete self grooming activities.    Baseline  one minute at eval; tested today and pt is able to stand for 3:30    Time  3    Period  Weeks    Status  On-going      PT LONG TERM GOAL #2   Title  Pt to be able to walk with minimum assist and her cane for 300 feet for improved ease of going to medical appointments.     Time  3    Period  Weeks    Status  On-going             Plan - 09/11/18 1030    Clinical Impression Statement  Pt reassessed today.  She has met her STG and is improving on her LTG.  PT was picked up for 6 visits only to get family and pt back into the routine of exercising on a daily basis at home as she was seen for  16 visits for the same diagnosis earlier this year.  PT will be discharged to [facility]    PT Frequency  2x / week    PT Duration  3 weeks    PT Treatment/Interventions  ADLs/Self Care Home Management;Therapeutic exercise;Therapeutic activities;Balance training;Neuromuscular re-education;Patient/family education    PT Next Visit Plan  Discharge     PT Home Exercise Plan  heel raise, marching, bridge, SLR, hip abduction.       Patient will benefit from skilled therapeutic intervention in order to improve the following deficits and impairments:  Abnormal gait, Decreased activity tolerance, Decreased balance, Decreased coordination, Difficulty walking, Decreased strength, Postural dysfunction, Decreased endurance  Visit Diagnosis: Unsteadiness on feet  History of falling  Muscle weakness (generalized)     Problem List Patient Active Problem List   Diagnosis Date Noted  . Hypotension 07/16/2018  . Fatigue 06/05/2018  . Mild intermittent asthma, uncomplicated 31/49/7026  . Weight loss, unintentional 07/11/2017  . Lightheadedness 06/16/2017  . Chronic bilateral low back pain without sciatica 12/19/2016  . Dementia in Parkinson's disease ([place]) 10/08/2016  . Hypertensive retinopathy of both eyes, grade 2 01/15/2016  . 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 History of colon polyps 12/25/2014  . Health care maintenance 02/13/2014  . Subclinical hypothyroidism 12/25/2013  . Right renal mass 06/11/2012  . Osteoarthritis of both knees 03/15/2012  . Parkinsonism ([place]) 07/13/2010  . ALLERGIC RHINITIS, SEASONAL 02/20/2007  . Hyperlipidemia 09/27/2006  . Essential  hypertension 09/27/2006  . GERD 09/27/2006  Rayetta Humphrey, PT CLT [phone] 09/11/2018, 10:33 AM  [facility] [address] [place], [place], [address] Phone: [phone]   Fax:  [phone]  Name: YAN OKRAY MRN: [id number] Date of Birth: May 22, 1941

## 2018-09-12 ENCOUNTER — Encounter (INDEPENDENT_AMBULATORY_CARE_PROVIDER_SITE_OTHER): Payer: PPO | Admitting: Ophthalmology

## 2018-09-12 DIAGNOSIS — I10 Essential (primary) hypertension: Secondary | ICD-10-CM

## 2018-09-12 DIAGNOSIS — H2513 Age-related nuclear cataract, bilateral: Secondary | ICD-10-CM | POA: Diagnosis not present

## 2018-09-12 DIAGNOSIS — H3553 Other dystrophies primarily involving the sensory retina: Secondary | ICD-10-CM | POA: Diagnosis not present

## 2018-09-12 DIAGNOSIS — H35033 Hypertensive retinopathy, bilateral: Secondary | ICD-10-CM

## 2018-09-12 DIAGNOSIS — H43813 Vitreous degeneration, bilateral: Secondary | ICD-10-CM

## 2018-09-12 LAB — HM DIABETES EYE EXAM

## 2018-09-13 ENCOUNTER — Ambulatory Visit (HOSPITAL_COMMUNITY): Payer: PPO | Admitting: Physical Therapy

## 2018-09-14 ENCOUNTER — Encounter: Payer: Self-pay | Admitting: *Deleted

## 2018-10-07 ENCOUNTER — Other Ambulatory Visit: Payer: Self-pay | Admitting: Internal Medicine

## 2018-10-07 DIAGNOSIS — I10 Essential (primary) hypertension: Secondary | ICD-10-CM

## 2018-10-08 NOTE — Telephone Encounter (Signed)
Received refill request from pt's pharmacy for lisinopril 20mg  tabs. Per chart, lisinopril was discontinued secondary to orthostatic hypotension.  Call made to patient to confirm if she was taking, spoke with pt's husband-he confirmed that pt WAS NOT taking lisinopril and her dtr may have requested refill in error.  Will refuse request at this time and make pcp aware.Criss Alvine, Jenika Chiem Cassady11/11/201911:05 AM

## 2018-10-08 NOTE — Telephone Encounter (Signed)
Thank you. I agree. Patient is not supposed to be on this medication.

## 2018-11-06 ENCOUNTER — Ambulatory Visit (INDEPENDENT_AMBULATORY_CARE_PROVIDER_SITE_OTHER): Payer: PPO | Admitting: Internal Medicine

## 2018-11-06 ENCOUNTER — Encounter: Payer: Self-pay | Admitting: Internal Medicine

## 2018-11-06 ENCOUNTER — Other Ambulatory Visit: Payer: Self-pay

## 2018-11-06 VITALS — BP 115/67 | HR 96 | Temp 98.1°F | Ht 61.5 in | Wt 112.1 lb

## 2018-11-06 DIAGNOSIS — R634 Abnormal weight loss: Secondary | ICD-10-CM | POA: Diagnosis not present

## 2018-11-06 DIAGNOSIS — Z79899 Other long term (current) drug therapy: Secondary | ICD-10-CM | POA: Diagnosis not present

## 2018-11-06 DIAGNOSIS — Z6820 Body mass index (BMI) 20.0-20.9, adult: Secondary | ICD-10-CM | POA: Diagnosis not present

## 2018-11-06 DIAGNOSIS — Z0000 Encounter for general adult medical examination without abnormal findings: Secondary | ICD-10-CM

## 2018-11-06 DIAGNOSIS — M5441 Lumbago with sciatica, right side: Secondary | ICD-10-CM

## 2018-11-06 DIAGNOSIS — F0280 Dementia in other diseases classified elsewhere without behavioral disturbance: Secondary | ICD-10-CM | POA: Diagnosis not present

## 2018-11-06 DIAGNOSIS — K219 Gastro-esophageal reflux disease without esophagitis: Secondary | ICD-10-CM | POA: Diagnosis not present

## 2018-11-06 DIAGNOSIS — I10 Essential (primary) hypertension: Secondary | ICD-10-CM | POA: Diagnosis not present

## 2018-11-06 DIAGNOSIS — G20 Parkinson's disease: Secondary | ICD-10-CM | POA: Diagnosis not present

## 2018-11-06 DIAGNOSIS — R630 Anorexia: Secondary | ICD-10-CM | POA: Diagnosis not present

## 2018-11-06 DIAGNOSIS — M545 Low back pain, unspecified: Secondary | ICD-10-CM

## 2018-11-06 DIAGNOSIS — G8929 Other chronic pain: Secondary | ICD-10-CM

## 2018-11-06 DIAGNOSIS — M5442 Lumbago with sciatica, left side: Secondary | ICD-10-CM

## 2018-11-06 NOTE — Progress Notes (Signed)
   Subjective:    Patient ID: Nicole Baxter, female    DOB: 11/24/1941, 77 y.o.   MRN: [id number]  HPI  I have seen and examined this patient.  Patient is here for routine follow-up of her hypertension and for an advanced care directives discussion.  Patient still complains of poor appetite and has not been eating well at home but is attempting to take boost and Ensure.  She is down approximately 3 pound since her last visit.  She denies any other complaints at this time.  She states that she is compliant with her medications.   Review of Systems  Constitutional: Positive for appetite change and unexpected weight change.  HENT: Negative.   Respiratory: Negative.   Cardiovascular: Negative.   Gastrointestinal: Negative.   Musculoskeletal: Positive for arthralgias.  Neurological: Negative.   Psychiatric/Behavioral: Negative.        Objective:   Physical Exam  Constitutional: She is oriented to person, place, and time. She appears well-developed.  Poorly nourished  HENT:  Head: Normocephalic and atraumatic.  Mouth/Throat: No oropharyngeal exudate.  Neck: Neck supple.  Cardiovascular: Normal rate, regular rhythm and normal heart sounds.  Pulmonary/Chest: Effort normal and breath sounds normal. She has no wheezes. She has no rales.  Abdominal: Soft. Bowel sounds are normal. She exhibits no distension. There is no tenderness.  Musculoskeletal: She exhibits no edema.  Lymphadenopathy:    She has no cervical adenopathy.  Neurological: She is alert and oriented to person, place, and time.  Psychiatric: She has a normal mood and affect. Her behavior is normal.          Assessment & Plan:  Please see problem based charting for assessment and plan:

## 2018-11-06 NOTE — Patient Instructions (Signed)
-  It was a pleasure seeing you today -We spoke with you and your family today about your wishes as far as your health care goes.  Please give us a copy of your advance care directives once that is notarized -Your blood pressure is doing well today. -Please continue to try to eat and drink as much as possible.  Your weight is down another 5 pounds today -Please follow-up with me in 3 months -Please call me if you are having any issues or have any questions

## 2018-11-07 NOTE — Assessment & Plan Note (Signed)
-  Patient blood pressure remains well controlled off all medications -She denies any lightheadedness or dizziness -However, she did have a fall 3 weeks ago when attempting to walk without her walker but denies any syncope or presyncopal episodes -No further work-up at this time

## 2018-11-07 NOTE — Assessment & Plan Note (Signed)
-  Patient had intermittent episodes of back pain.  She has stopped taking the meloxicam as it caused her to feel dizzy. -I explained to the patient that if it causes lightheadedness then to not take that medication.  Patient and family stated pain is moderately controlled on Tylenol alone. -Patient will continue with Tylenol as needed for her back pain and take Aleve or Advil intermittently if pain is not controlled

## 2018-11-07 NOTE — Assessment & Plan Note (Signed)
-  This problem is chronic and stable -Patient states that she still has issues with her memory but this appears to be about baseline -Continue with Exelon per neurology -No further work-up at this time

## 2018-11-07 NOTE — Assessment & Plan Note (Signed)
-  Patient continues to have poor appetite and decreased oral intake as well as continuing weight loss -She is down approximately 3 pounds in the last 2 months and 5 pounds in the last 3 months. -I suspect this may be secondary to her underlying parkinsonism -I discussed with the family and the patient about possibly pursuing a malignancy work-up but family and patient are in agreement that did not want an aggressive work-up for malignancy at this time and would rather focus on trying to improve her nutritional status

## 2018-11-07 NOTE — Assessment & Plan Note (Signed)
-  This problem is chronic and stable -Patient follows up with Dr. Daisy BlossomAhearn for this and has an appointment in February -She is compliant with her Sinemet and states that her tremors are under control -No further work-up at this time

## 2018-11-07 NOTE — Assessment & Plan Note (Signed)
-  I had an extensive discussion with the patient and the family about advanced care directives today -Patient was accompanied by her husband, 3 children and daughter-in-law for discussion -We discussed healthcare proxy's and patient and family are in agreement that they would want the daughter [name](Teresa) and son Leonette Most(Charles) to be the healthcare proxy for the patient -I also discussed with them about what DNR/DNI means, what artificial nutrition means and the likelihood that the patient would not return to her current quality of life if she had to be resuscitated.  Patient and family would like to discuss further and fill out her advance directives and get them notarized. -They will give us a copy of this once it is notarized

## 2018-11-07 NOTE — Assessment & Plan Note (Signed)
-  Patient does intermittently complain of some mild epigastric pain -Patient is unable to describe the nature of the pain -She did have a history of reflux and was on a PPI years ago -I discussed with the patient and family that the pain may be secondary to reflux symptoms or gas.  If the pain recurs to try an over-the-counter PPI or simethicone and see if the pain resolves. -If this persists she may need further work-up.

## 2018-12-22 ENCOUNTER — Other Ambulatory Visit: Payer: Self-pay | Admitting: Internal Medicine

## 2018-12-22 DIAGNOSIS — G20 Parkinson's disease: Secondary | ICD-10-CM

## 2019-01-04 ENCOUNTER — Encounter: Payer: Self-pay | Admitting: Internal Medicine

## 2019-01-04 DIAGNOSIS — G20 Parkinson's disease: Secondary | ICD-10-CM

## 2019-01-04 NOTE — Progress Notes (Signed)
PATIENT: Nicole Baxter DOB: Jul 25, 1941  REASON FOR VISIT: follow up HISTORY FROM: patient  Chief Complaint  Patient presents with  . Follow-up    6 month follow up. Son, daughter and daughter in law present. New room. Patient's family has stated that she is still not eating, taking her medications or drinking water. She is also having balance issues and issues with sleeping.     HISTORY OF PRESENT ILLNESS: Today 01/07/19 SUMMIT BORCHARDT is a 78 y.o. female here today for follow up of Parkinson's. Patient's family with her today. They share several concerns that they have noticed over the past year as well as more acute concerns for the past 3 days. She has not been eating as well. Some days are better than others. She is not drinking fluids with exception of taking medications. Son reports that "she hold on to her saliva" and appears to have more trouble swallowing.This also waxes and wanes. She has lost 7 pounds since her last visit in July 2019. Her PCP has placed an order for her to be evaluated by home health. She is having multiple falls. They do not feel she has hit her head or had any other injuries. Her memory has declined. She is having trouble remembering family members and daughter reports hallucinations at times.   Family reports that over the past 3 days she has seemed much more confused and weak. She is not eating or drinking much at all. She has not eaten a meal in 2 days and had about 2-3 ounces of Ensure yesterday. Her daughter in law reports that she has had a strong urine odor but is unsure if this is new. No URI symptoms or fever that they have noticed. Her balance is poor. She was unable to walk to the exam room today and that is unusual. She is very sleepy during the day and is not communicating as well. She reports a midfrontal throbbing headache. Family is uncertain how long this has been going on but reports that it responds to Tylenol. Headaches are worse in the mornings.    HISTORY: (copied from Dr Trevor Mace note on 06/18/2018) Interval 06/18/2018: She has not been using her walker and she has fallen several times. Discussed fall risks. Doing well on the Sinemet IR. Memory continues to decline.  She continues Exelon. No side effects to the Carbidope/levodopa. No dyskinesias. No dizziness. The medication may be wearing off but unclear if they take it every 4 hours.  Daughter is very involved and here at appointment as is husband. Discussed PD and PD Dementia. They are watching her weight and giving her ensure. No dyskinesias. Slowly progressive memory changes, making phone calls to daughter and repeating herself. No dyskinesias. No dysphagia. Not orthostatic. No behavioral problems, she denies depression and is happy and pleasant at appointment today.  Interval history: She took her medication this morning and still has symptoms. But it is improved. She can tell the difference. No side effects. Will increase to 2 pills three times a day 4 hours a part try to separate from protein. No falls. Swallowing ok, no choking. She talks in her sleep. Discussed REM sleep disorder. No side effects. Daughter not here today.   Interval history 01/09/2017: Daughter notices the sinemet working and lasts until the next dose. No falls. She is not drinking water but she is drinking sprite 2 glasses a day. Discussed this is not enough. She completed physical therapy and she feels this helped. She  has not taken her medication today and has a resting tremor. Gait is better after physical therapy. Memory is stable.She can remember her grandchildren's names today. B12 a month ago was much improved to over 900 (from 185).   Interval history 10/08/2016: Patient and husband are here today with daughter for follow-up of Parkinson's disease and dementia. She has a past medical history of recently diagnosed hyperthyroidism(treated, last tsh wnl), essential tremor, hypertension, asthma, hyperlipidemia,  diabetes, osteoarthritis. No history of dopamine blocking agents. 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 Sinemet helps her resting tremor however she forgets to take it.  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 Patient has not been noncompliant with medications or directions. Today fortunately she is here with her daughter who is going to be more involved. Reviewed with daughter that I do feel that carbidopa/levodopa is the best medication for patient unfortunately in the past she would take 1 pill a day or stop taking it all together even though she reported it would help when taking it. Reviewed the history of medications tried with mother and discussedParkinson's disease. We'll try Sinemet again, discussed with daughter that she should be involved with patient's medication management, she should set up a pill dispenser and also visit multiple times a week or hire somebody to manage daily compliance. Patient is a fall risk and this is a concern.  Interval history 02/01/2016:She feels the medication is helping. She has not taken the Sinemet since yesterday. When she takes the sinemet the tremor improves. She takes it 3x a day. Here with husband. Giving ut to her at 10am, 1pm and 8 pm. Advised to take it every 4 hours. Wears off in 2-3 hours. Discussed trying a patch every day to see if that helps with medication administration. When she takes the medication the tremor improves and then it wears off in 3 hours. No falls. She forgets to take the medication. She is having some hallucinations. She was seeing people that were not there and babies not there. Her memory is stable. She has drooling. Drool on her  pillow and sometimes during the day and it starts dripping. No difficulty swallowing. No choking on foods. No falls. Can continue to take the Sinemet.   Interval History 11/03/2015: here for follow up of Parkinsonism. She says she ran out of the prescription a few days ago and she has not taken the Sinemet in a few days. It really helps when she takes it. When she takes the Sinemet, the tremors stop. No side effects from the Sinemet. She feels the Sinemet lasts until the next dose. She has memory loss. We had ordered an MRI of the brain and she tried to do it but got claustrophobic. We need to get an MRi of the brain due to parkinsonism and memory loss. Will order and give some Xanax before going in to an open MRI. She has a manual wheelchair, we tried to get her a power wheelchair but it was too expensive. She feels much improved since being discharged from the hospital and starting B12 injections. B12 was 185 and she is having injections. Last thyroid was normal. Will do a MoCA today and start Rivastigmine for memory loss. Denies any confusion or altered mentation recently.  They take the sinemet at 9am then 3or 4pm then 9pm. Discussed should take it every 4 hours. 9am, 1pm and 5pm. Do not take it with protein. No falls. No problems swallowing, not choking on food. No dyskinesias.  Addendum 07/22/2015: Called to see how she is doing. She hasn't taken the Sinemet since yesterday. Not taking it three times a day as prescribed. Sounds like it is difficullt for patient to remember to take her medication. She does say when she takes the medication it helps.   Interval update 06/24/2015:They are taking 1/2 pill of the sinemet with breakfast. She forgot she is not supposed to take it with food. It helps the tremor. No side effects from the Sinemet. Will increase the sinemet to a whole pill three times a day. She did not take her sinemet this morning. Symptoms brain they're Sinemet with some the next time they  come, we can take it in clinic and evaluate response. But if the Sinemet appears to help with her tremor, and this confirms the diagnosis of Parkinson's. May consider adding Azilect concurrently.  Interval update 05/12/2014: She had the radioactive iodine treatment and tremor is much improved. She still has bilateral upper resting tremor. She says she is walking a little better but husband disagrees. Husband says her knee hurts and stays sore. Husband feels her voice volume is lower and he can't hear her. She shuffles. Her sense of smell is poor. She is off balance.  We'll start Sinemet low dose. They are to call me back in 2 weeks to discuss how patient is responding. Discussed common side effects including nausea and GI side effects, orthostatic hypotension, dizziness and increased risk of falls. They are to stop the medication if significant side effects occur.  04/14/2015: Nicole Baxter is a 78 y.o. female here as a referral from Dr. Andrey Campanile for tremor.She has a past medical history of recently diagnosed hyperthyroidism. She is here with her husband who provides much of the information. She also has a past medical history of essential tremor, hypertension, asthma, hyperlipidemia, asthma, diabetes, osteoarthritis. No history of dopamine blocking agents. 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  She has a daily tremor. She had a tremor in the past, similar tremor but it went away. It came back last year. She has good days and bad days with the tremor. Her right leg sometimes won't move. There are some days when it is better. She shuffles and is slow walking. She can't move when walking sometimes, she freezes. The tone of her voice is lower, husband says she used to talk much louder. She doesn't yell anymore. She denies decreased smell sensation.  She has constipation. Her hand writing is terrible. She is able to eat with her tremor. Tremor is better with movement. Worse at rest. Answer legs shake, feels like nervousness. Mother has a tremor. Cousins have tremors so they think it is a family issue. She is hyperthyroid and is going to [facility] for treatment. No FHx of PD. She uses a walker at baseline. Walking has worsened.   REVIEW OF SYSTEMS: Out of a complete 14 system review of symptoms, the patient complains only of the following symptoms, incontinence, frequent waking, daytime sleepiness, walking difficulty, passing out, hallucinations and all other reviewed systems are negative.  ALLERGIES: No Known Allergies  HOME MEDICATIONS: Outpatient Medications Prior to Visit  Medication Sig Dispense Refill  . albuterol (PROVENTIL HFA;VENTOLIN HFA) 108 (90 Base) MCG/ACT inhaler Inhale 2 puffs into the lungs every 6 (six) hours as needed for wheezing or shortness of breath. 1 Inhaler 2  . atorvastatin (LIPITOR) 40 MG tablet Take 1 tablet (40 mg total) by mouth daily. Patient requested easy open caps for all medication bottles. 90 tablet 3  . carbidopa-levodopa (SINEMET IR) 25-100 MG tablet TAKE 2 TABLETS BY MOUTH THREE TIMES DAILY - TAKE 4 HOURS APART. MAY TAKE AN EXTRA DOSE IN THE EVENINGS 320 tablet 1  . cyanocobalamin ([facility] VITAMIN B12) 1000 MCG tablet Take 1 tablet (1,000 mcg total) by mouth daily. 30 tablet 1  . diclofenac sodium (VOLTAREN) 1 % GEL APPLY A SMALL AMOUNT TOPICALLY TO RIGHT ACHILLES TENDON AREA THREE TIMES DAILY AS DIRECTED 100 g 1  . rivastigmine (EXELON) 6 MG capsule Take 1 capsule (6 mg total) by mouth 2 (two) times daily. 180 capsule 6   No facility-administered medications prior to visit.     PAST MEDICAL HISTORY: Past Medical History:  Diagnosis Date  . Arthritis   . Dementia (HCC)   . GERD (gastroesophageal reflux disease)    pt. states she no longer has GERD (09/11/15)  . Hyperglycemia    isolated FBS 122,  95, 104 (Random 138)  . Hyperlipidemia    on pravastatin  . Hypertension    controlled on 2 agents  . 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 Obesity   . Parkinson's disease (tremor, stiffness, slow motion, unstable posture) (HCC)   . Shortness of breath dyspnea    pt. states that she no longer has SOB )09/11/15  . Tremor     PAST SURGICAL HISTORY: Past Surgical History:  Procedure Laterality Date  . CESAREAN SECTION    . COLONOSCOPY    . TOTAL KNEE ARTHROPLASTY Left 09/23/2015   Procedure: LEFT TOTAL KNEE ARTHROPLASTY;  Surgeon: Loreta Ave, MD;  Location: [facility] OR;  Service: Orthopedics;  Laterality: Left;    FAMILY HISTORY: Family History  Problem Relation Age of Onset  . Heart disease Mother 34       died of MI @ 74  . Alcohol abuse Father   . Cancer Brother   . Colon cancer Neg Hx     SOCIAL HISTORY: Social History   Socioeconomic History  . Marital status: Married    Spouse name: Not on file  . Number of children: 5  . Years of education: 36  . Highest education level: Not on file  Occupational History  . Not on file  Social Needs  . Financial resource strain: Not on file  . Food insecurity:    Worry: Not on file    Inability: Not on file  . Transportation needs:    Medical: Not on file    Non-medical: Not on file  Tobacco Use  . Smoking status: Never Smoker  . Smokeless tobacco: Never Used  Substance and Sexual Activity  . Alcohol use: No    Alcohol/week: 0.0 standard drinks  . Drug use: No  . Sexual activity: Never  Lifestyle  . Physical activity:    Days per week: Not on file    Minutes per session: Not on file  . Stress: Not on file  Relationships  . Social connections:    Talks on phone: Not on file    Gets together: Not on file    Attends religious service: Not on file  Active member of club or organization: Not on file    Attends meetings of clubs or organizations: Not on file    Relationship status: Not on file  .  Intimate partner violence:    Fear of current or ex partner: Not on file    Emotionally abused: Not on file    Physically abused: Not on file    Forced sexual activity: Not on file  Other Topics Concern  . Not on file  Social History Narrative   Worked at [employer] 31 years. Married to [place] .Active, out and about q day.   5 children, 1 with sarcoidosis, 1 with COPD.   Caffeine use: Drinks a 6 pack of soda per week   Right-handed      PHYSICAL EXAM  Vitals:   01/07/19 0829  BP: 129/77  Pulse: 66  Weight: 112 lb 12.8 oz (51.2 kg)  Height: 5' 1.5" (1.562 m)   Body mass index is 20.97 kg/m.  Generalized: Well developed, in no acute distress  Cardiology: normal rate and rhythm, no murmur noted Neurological examination  Mentation: Confused, Oriented to self. Follows all commands, speech and language appropriate. Cranial nerve II-XII: Pupils were equal round reactive to light. Extraocular movements were full, visual field were full on confrontational test. Facial sensation and strength were normal. Uvula tongue midline. Head turning normal and symmetric. Motor: The motor testing reveals 4 over 5 strength of upper extremities, 3/5 in bilateral lower extremities. Rigid tone is noted throughout. Bradykinesia with finger and toe taps, no resting tremor noted today  Sensory: Sensory testing is intact to soft touch on all 4 extremities.   Coordination: Cerebellar testing reveals good finger-nose-finger   Gait and station: unable to assess today due to patient reports of weakness    DIAGNOSTIC DATA (LABS, IMAGING, TESTING) - I reviewed patient records, labs, notes, testing and imaging myself where available.  MMSE - Mini Mental State Exam 06/18/2018  Orientation to time 1  Orientation to Place 4  Registration 3  Attention/ Calculation 2  Recall 0  Language- name 2 objects 2  Language- repeat 1  Language- follow 3 step command 2  Language- read & follow direction 1  Write a  sentence 0  Copy design 0  Total score 16     Lab Results  Component Value Date   WBC 7.6 08/04/2018   HGB 13.5 08/04/2018   HCT 40.5 08/04/2018   MCV 99.0 08/04/2018   PLT 174 08/04/2018      Component Value Date/Time   NA 142 08/04/2018 1436   NA 144 06/05/2018 1124   K 3.7 08/04/2018 1436   CL 108 08/04/2018 1436   CO2 27 08/04/2018 1436   GLUCOSE 101 (H) 08/04/2018 1436   BUN 16 08/04/2018 1436   BUN 9 06/05/2018 1124   CREATININE 0.91 08/04/2018 1436   CREATININE 0.73 03/02/2015 1037   CALCIUM 9.0 08/04/2018 1436   PROT 7.6 10/14/2015 1453   ALBUMIN 4.1 10/14/2015 1453   AST 13 10/14/2015 1453   ALT 5 10/14/2015 1453   ALKPHOS 117 10/14/2015 1453   BILITOT 0.6 10/14/2015 1453   GFRNONAA >60 08/04/2018 1436   GFRNONAA 83 03/02/2015 1037   GFRAA >60 08/04/2018 1436   GFRAA >89 03/02/2015 1037   Lab Results  Component Value Date   CHOL 129 05/09/2017   HDL 82 05/09/2017   LDLCALC 38 05/09/2017   TRIG 46 05/09/2017   CHOLHDL 1.6 05/09/2017   Lab Results  Component  Value Date   HGBA1C 5.5 05/09/2017   Lab Results  Component Value Date   VITAMINB12 923 12/03/2015   Lab Results  Component Value Date   TSH 5.110 (H) 06/05/2018     ASSESSMENT AND PLAN 78 y.o. year old female  has a past medical history of Arthritis, Dementia (HCC), GERD (gastroesophageal reflux disease), Hyperglycemia, Hyperlipidemia, Hypertension, Lung disease, occupational, Obesity, Parkinson's disease (tremor, stiffness, slow motion, unstable posture) (HCC), Shortness of breath dyspnea, and Tremor. here with     ICD-10-CM   1. Parkinson's disease (HCC) G20   2. 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 Acute confusion R41.0   4. Balance problems R26.89     She appears to have had steady progression of PD and PD dementia compared to last visit in July, 2019. I am concerned about acute changes over the past weekend. After discussion with Dr Lucia Gaskins, I have recommended ER  evaluation to rule out infectious/intracranial contributors to condition. I have discussed this recommendation with Mrs Hartinger' family who are in agreement with transporting her to [facility] ER. EMS transportation offered but declined; I do not feel patient is in any immediate life threatening danger. We have also discussed potential need to refer to Hospice. Family in agreement and we will follow up pending ER evaluation.    No orders of the defined types were placed in this encounter.    No orders of the defined types were placed in this encounter.    Shawnie Dapper, FNP-C 01/07/2019, 9:54 AM [facility] [address] [place], [place] [address] [phone]

## 2019-01-07 ENCOUNTER — Telehealth: Payer: Self-pay | Admitting: *Deleted

## 2019-01-07 ENCOUNTER — Ambulatory Visit: Payer: PPO | Admitting: Adult Health

## 2019-01-07 ENCOUNTER — Encounter (HOSPITAL_COMMUNITY): Payer: Self-pay | Admitting: *Deleted

## 2019-01-07 ENCOUNTER — Emergency Department (HOSPITAL_COMMUNITY)
Admission: EM | Admit: 2019-01-07 | Discharge: 2019-01-07 | Disposition: A | Discharge status: Home / Self Care | Payer: PPO | Attending: Emergency Medicine | Admitting: Emergency Medicine

## 2019-01-07 ENCOUNTER — Emergency Department (HOSPITAL_COMMUNITY): Payer: PPO

## 2019-01-07 ENCOUNTER — Encounter: Payer: Self-pay | Admitting: Family Medicine

## 2019-01-07 ENCOUNTER — Ambulatory Visit (INDEPENDENT_AMBULATORY_CARE_PROVIDER_SITE_OTHER): Payer: PPO | Admitting: Family Medicine

## 2019-01-07 ENCOUNTER — Other Ambulatory Visit: Payer: Self-pay

## 2019-01-07 VITALS — BP 129/77 | HR 66 | Ht 61.5 in | Wt 112.8 lb

## 2019-01-07 DIAGNOSIS — Z79899 Other long term (current) drug therapy: Secondary | ICD-10-CM | POA: Diagnosis not present

## 2019-01-07 DIAGNOSIS — G20 Parkinson's disease: Secondary | ICD-10-CM | POA: Diagnosis not present

## 2019-01-07 DIAGNOSIS — F0280 Dementia in other diseases classified elsewhere without behavioral disturbance: Secondary | ICD-10-CM

## 2019-01-07 DIAGNOSIS — R531 Weakness: Secondary | ICD-10-CM

## 2019-01-07 DIAGNOSIS — I10 Essential (primary) hypertension: Secondary | ICD-10-CM | POA: Insufficient documentation

## 2019-01-07 DIAGNOSIS — R2689 Other abnormalities of gait and mobility: Secondary | ICD-10-CM

## 2019-01-07 DIAGNOSIS — E860 Dehydration: Secondary | ICD-10-CM | POA: Insufficient documentation

## 2019-01-07 DIAGNOSIS — F0390 Unspecified dementia without behavioral disturbance: Secondary | ICD-10-CM | POA: Diagnosis not present

## 2019-01-07 DIAGNOSIS — J45909 Unspecified asthma, uncomplicated: Secondary | ICD-10-CM | POA: Insufficient documentation

## 2019-01-07 DIAGNOSIS — Z96652 Presence of left artificial knee joint: Secondary | ICD-10-CM | POA: Diagnosis not present

## 2019-01-07 DIAGNOSIS — R410 Disorientation, unspecified: Secondary | ICD-10-CM | POA: Diagnosis not present

## 2019-01-07 LAB — COMPREHENSIVE METABOLIC PANEL
ALT: 8 U/L (ref 0–44)
ANION GAP: 6 (ref 5–15)
AST: 21 U/L (ref 15–41)
Albumin: 3.2 g/dL — ABNORMAL LOW (ref 3.5–5.0)
Alkaline Phosphatase: 62 U/L (ref 38–126)
BUN: 11 mg/dL (ref 8–23)
CO2: 26 mmol/L (ref 22–32)
Calcium: 8.3 mg/dL — ABNORMAL LOW (ref 8.9–10.3)
Chloride: 113 mmol/L — ABNORMAL HIGH (ref 98–111)
Creatinine, Ser: 0.97 mg/dL (ref 0.44–1.00)
GFR calc Af Amer: >60 mL/min (ref >60)
GFR calc non Af Amer: 57 mL/min — ABNORMAL LOW (ref >60)
Glucose, Bld: 103 mg/dL — ABNORMAL HIGH (ref 70–99)
Potassium: 3.5 mmol/L (ref 3.5–5.1)
Sodium: 145 mmol/L (ref 135–145)
Total Bilirubin: 1.7 mg/dL — ABNORMAL HIGH (ref 0.3–1.2)
Total Protein: 6.2 g/dL — ABNORMAL LOW (ref 6.5–8.1)

## 2019-01-07 LAB — CBC WITH DIFFERENTIAL/PLATELET
Abs Immature Granulocytes: 0.02 10*3/uL (ref 0.00–0.07)
Basophils Absolute: 0.1 10*3/uL (ref 0.0–0.1)
Basophils Relative: 1 %
Eosinophils Absolute: 0.3 10*3/uL (ref 0.0–0.5)
Eosinophils Relative: 5 %
HCT: 42.3 % (ref 36.0–46.0)
Hemoglobin: 14 g/dL (ref 12.0–15.0)
Immature Granulocytes: 0 %
Lymphocytes Relative: 25 %
Lymphs Abs: 1.6 10*3/uL (ref 0.7–4.0)
MCH: 32.7 pg (ref 26.0–34.0)
MCHC: 33.1 g/dL (ref 30.0–36.0)
MCV: 98.8 fL (ref 80.0–100.0)
Monocytes Absolute: 0.4 10*3/uL (ref 0.1–1.0)
Monocytes Relative: 6 %
Neutro Abs: 4.1 10*3/uL (ref 1.7–7.7)
Neutrophils Relative %: 63 %
Platelets: 250 10*3/uL (ref 150–400)
RBC: 4.28 MIL/uL (ref 3.87–5.11)
RDW: 13 % (ref 11.5–15.5)
WBC: 6.5 10*3/uL (ref 4.0–10.5)
nRBC: 0 % (ref 0.0–0.2)

## 2019-01-07 LAB — URINALYSIS, ROUTINE W REFLEX MICROSCOPIC
BILIRUBIN URINE: NEGATIVE
Glucose, UA: NEGATIVE mg/dL
HGB URINE DIPSTICK: NEGATIVE
Ketones, ur: 5 mg/dL — AB
Leukocytes, UA: NEGATIVE
Nitrite: NEGATIVE
Protein, ur: NEGATIVE mg/dL
SPECIFIC GRAVITY, URINE: 1.021 (ref 1.005–1.030)
pH: 5 (ref 5.0–8.0)

## 2019-01-07 LAB — TROPONIN I

## 2019-01-07 LAB — LIPASE, BLOOD: Lipase: 57 U/L — ABNORMAL HIGH (ref 11–51)

## 2019-01-07 MED ORDER — SODIUM CHLORIDE 0.9 % IV BOLUS
1000.0000 mL | Freq: Once | INTRAVENOUS | Status: AC
  Administered 2019-01-07: 1000 mL via INTRAVENOUS

## 2019-01-07 MED ORDER — ONDANSETRON HCL 4 MG PO TABS: 4.0000 mg | ORAL_TABLET | Freq: Three times a day (TID) | ORAL | 0 refills | Status: DC | PRN

## 2019-01-07 NOTE — Telephone Encounter (Signed)
I have placed the order for social work. Thank you

## 2019-01-07 NOTE — Progress Notes (Signed)
Personally  participated in, made any corrections needed, and agree with history, physical, neuro exam,assessment and plan as stated.     Tynesha Free, MD [facility]     

## 2019-01-07 NOTE — ED Notes (Signed)
ED Provider at bedside. 

## 2019-01-07 NOTE — ED Triage Notes (Signed)
Daughter states she is weak, hx. Parkinson problems swallowing . Patient also has a hx. Of dementia

## 2019-01-07 NOTE — Patient Instructions (Signed)
ER for evaluation of acute balance and cognition changes Follow up pending ER evaluation Consider Hospice    Parkinson Disease Parkinson disease is a long-term (chronic) condition. It gets worse over time (is progressive). Parkinson disease limits your ability:  To control how your body moves.  To move your body normally. This condition affects each person differently. The condition can range from mild to very bad. This condition tends to progress slowly over many years. Follow these instructions at home:  Take over-the-counter and prescription medicines only as told by your doctor.  Put grab bars and railings in your home. These help to prevent falls.  Follow instructions from your doctor about what you can or cannot eat or drink.  Go back to your normal activities as told by your doctor. Ask your doctor what activities are safe for you.  Exercise as told by your doctor or physical therapist.  Keep all follow-up visits as told by your doctor. This is important. These include any visits with a speech or occupational therapist.  Think about joining a support group for people who have Parkinson disease. Contact a doctor if:  Medicines do not help your symptoms.  You feel off-balance.  You fall at home.  You need more help at home.  You have: ? Trouble swallowing. ? A very hard time pooping (constipation). ? A lot of side effects from your medicines.  You see or hear things that are not real (hallucinate).  You feel: ? Confused. ? Anxious. ? Sad (depressed). Get help right away if:  You were hurt in a fall.  You cannot swallow without choking.  You have chest pain.  You have trouble breathing.  You do not feel safe at home. This information is not intended to replace advice given to you by your health care provider. Make sure you discuss any questions you have with your health care provider. Document Released: 02/06/2012 Document Revised: 04/21/2016 Document  Reviewed: 09/04/2015 Elsevier Interactive Patient Education  2019 [employer].

## 2019-01-07 NOTE — Progress Notes (Signed)
Thank you for taking such good care of this patient, you are wonderful

## 2019-01-07 NOTE — ED Provider Notes (Signed)
[facility]   CSN: [id number] Arrival date & time: 01/07/19  1015     History   Chief Complaint Chief Complaint  Patient presents with  . Weakness    HPI Nicole Baxter is a 78 y.o. female.  Level 5 caveat secondary to dementia.  78 year old female with history of Parkinson's brought in by her daughter from the primary care doctor's office for evaluation of generalized weakness.  Son thinks been going on for 5 days.  She is been having intermittent headaches.  They have mostly been able to get her meds into her although she refused them yesterday.  She has not been drinking much.  Has not moved her bowels in a couple days.  She denies any chest pain cough abdominal pain urinary symptoms.  She had a fall about a month ago.  Uses a walker at baseline.  No reports of fever.  The daughter was also concerned about some bumps that she noticed underneath her tongue.  The history is provided by the patient and a relative.  Weakness  Severity:  Moderate Onset quality:  Gradual Timing:  Constant Progression:  Unchanged Chronicity:  New Context: dehydration   Context: not change in medication   Relieved by:  Nothing Worsened by:  Nothing Ineffective treatments:  None tried Associated symptoms: difficulty walking, falls (1 month ago) and headaches (intermittent)   Associated symptoms: no abdominal pain, no chest pain, no cough, no diarrhea, no dysuria, no fever, no foul-smelling urine, no loss of consciousness, no seizures, no shortness of breath, no stroke symptoms and no vomiting   Risk factors: neurologic disease     Past Medical History:  Diagnosis Date  . Arthritis   . Dementia (HCC)   . GERD (gastroesophageal reflux disease)    pt. states she no longer has GERD (09/11/15)  . Hyperglycemia    isolated FBS 122, 95, 104 (Random 138)  . Hyperlipidemia    on pravastatin  . Hypertension    controlled on 2 agents  . 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 Obesity   . Parkinson's disease (tremor, stiffness, slow motion, unstable posture) (HCC)   . Shortness of breath dyspnea    pt. states that she no longer has SOB )09/11/15  . Tremor     Patient Active Problem List   Diagnosis Date Noted  . Hypotension 07/16/2018  . Fatigue 06/05/2018  . Mild intermittent asthma, uncomplicated 10/31/2017  . Weight loss, unintentional 07/11/2017  . Lightheadedness 06/16/2017  . Chronic bilateral low back pain without sciatica 12/19/2016  . 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 Hypertensive retinopathy of both eyes, grade 2 01/15/2016  . 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 History of colon polyps 12/25/2014  . Health care maintenance 02/13/2014  . Subclinical hypothyroidism 12/25/2013  . Right renal mass 06/11/2012  . Osteoarthritis of both knees 03/15/2012  . Parkinsonism (HCC) 07/13/2010  . ALLERGIC RHINITIS, SEASONAL 02/20/2007  . Hyperlipidemia 09/27/2006  . Essential hypertension 09/27/2006  . GERD 09/27/2006    Past Surgical History:  Procedure Laterality Date  . CESAREAN SECTION    . COLONOSCOPY    . TOTAL KNEE ARTHROPLASTY Left 09/23/2015   Procedure: LEFT TOTAL KNEE ARTHROPLASTY;  Surgeon: Loreta Aveaniel F Murphy, MD;  [facility];  Service: Orthopedics;  Laterality: Left;     OB History   No obstetric history on file.      Home Medications  Prior to Admission medications   Medication Sig Start Date End Date Taking? Authorizing Provider  albuterol (PROVENTIL HFA;VENTOLIN HFA) 108 (90 Base) MCG/ACT inhaler Inhale 2 puffs into the lungs every 6 (six) hours as needed for wheezing or shortness of breath. 10/31/17   Rozann LeschesNedrud, Marybeth, MD  atorvastatin (LIPITOR) 40 MG tablet Take 1 tablet (40 mg total) by mouth daily. Patient requested easy open caps for all medication bottles. 06/05/18   Earl LagosNarendra, Nischal, MD  carbidopa-levodopa (SINEMET IR) 25-100 MG  tablet TAKE 2 TABLETS BY MOUTH THREE TIMES DAILY - TAKE 4 HOURS APART. MAY TAKE AN EXTRA DOSE IN THE EVENINGS 12/25/18   Earl LagosNarendra, Nischal, MD  cyanocobalamin ([employer] VITAMIN B12) 1000 MCG tablet Take 1 tablet (1,000 mcg total) by mouth daily. 02/08/17   Earl LagosNarendra, Nischal, MD  diclofenac sodium (VOLTAREN) 1 % GEL APPLY A SMALL AMOUNT TOPICALLY TO RIGHT ACHILLES TENDON AREA THREE TIMES DAILY AS DIRECTED 08/30/18   Earl LagosNarendra, Nischal, MD  rivastigmine (EXELON) 6 MG capsule Take 1 capsule (6 mg total) by mouth 2 (two) times daily. 06/18/18   Anson FretAhern, Antonia B, MD    Family History Family History  Problem Relation Age of Onset  . Heart disease Mother 2476       died of MI @ 3076  . Alcohol abuse Father   . Cancer Brother   . Colon cancer Neg Hx     Social History Social History   Tobacco Use  . Smoking status: Never Smoker  . Smokeless tobacco: Never Used  Substance Use Topics  . Alcohol use: No    Alcohol/week: 0.0 standard drinks  . Drug use: No     Allergies   Patient has no known allergies.   Review of Systems Review of Systems  Constitutional: Positive for activity change and appetite change. Negative for fever.  HENT: Negative for sore throat.   Eyes: Negative for visual disturbance.  Respiratory: Negative for cough and shortness of breath.   Cardiovascular: Negative for chest pain.  Gastrointestinal: Negative for abdominal pain, diarrhea and vomiting.  Genitourinary: Negative for dysuria.  Musculoskeletal: Positive for falls (1 month ago). Negative for neck pain.  Skin: Negative for rash.  Neurological: Positive for weakness and headaches (intermittent). Negative for seizures and loss of consciousness.     Physical Exam Updated Vital Signs BP 114/70   Pulse 67   Temp 97.6 F (36.4 C) (Oral)   Resp 16   SpO2 99%   Physical Exam Vitals signs and nursing note reviewed.  Constitutional:      General: She is not in acute distress.    Appearance: She is well-developed.    HENT:     Head: Normocephalic and atraumatic.     Mouth/Throat:     Comments: She is some hard torus bony formation underneath her tongue.  They are nontender to the patient.  Likely this is old. Eyes:     Conjunctiva/sclera: Conjunctivae normal.  Neck:     Musculoskeletal: Neck supple.  Cardiovascular:     Rate and Rhythm: Normal rate and regular rhythm.     Heart sounds: No murmur.  Pulmonary:     Effort: Pulmonary effort is normal. No respiratory distress.     Breath sounds: Normal breath sounds.  Abdominal:     Palpations: Abdomen is soft.     Tenderness: There is no abdominal tenderness.  Musculoskeletal:        General: No deformity.     Right lower leg: No edema.  Left lower leg: No edema.  Skin:    General: Skin is warm and dry.     Capillary Refill: Capillary refill takes less than 2 seconds.  Neurological:     General: No focal deficit present.     Mental Status: She is alert. Mental status is at baseline. She is disoriented.      ED Treatments / Results  Labs (all labs ordered are listed, but only abnormal results are displayed) Labs Reviewed  URINALYSIS, ROUTINE W REFLEX MICROSCOPIC - Abnormal; Notable for the following components:      Result Value   Ketones, ur 5 (*)    All other components within normal limits  COMPREHENSIVE METABOLIC PANEL - Abnormal; Notable for the following components:   Chloride 113 (*)    Glucose, Bld 103 (*)    Calcium 8.3 (*)    Total Protein 6.2 (*)    Albumin 3.2 (*)    Total Bilirubin 1.7 (*)    GFR calc non Af Amer 57 (*)    All other components within normal limits  LIPASE, BLOOD - Abnormal; Notable for the following components:   Lipase 57 (*)    All other components within normal limits  URINE CULTURE  CBC WITH DIFFERENTIAL/PLATELET  TROPONIN I    EKG None  Radiology Dg Chest Port 1 View  Result Date: 01/07/2019 CLINICAL DATA:  Weakness with mental status changes and unable to walk. Parkinson's disease.  EXAM: PORTABLE CHEST 1 VIEW COMPARISON:  08/04/2018 FINDINGS: Lungs are adequately inflated without focal airspace consolidation or effusion. Cardiomediastinal silhouette and remainder of the exam is unchanged. IMPRESSION: No active disease. Electronically Signed   By: Elberta Fortis M.D.   On: 01/07/2019 12:44    Procedures Procedures (including critical care time)  Medications Ordered in ED Medications  sodium chloride 0.9 % bolus 1,000 mL (has no administration in time range)     Initial Impression / Assessment and Plan / ED Course  I have reviewed the triage vital signs and the nursing notes.  Pertinent labs & imaging results that were available during my care of the patient were reviewed by me and considered in my medical decision making (see chart for details).  Clinical Course as of Jan 07 1726  Mon Jan 07, 2019  1417 Reevaluation after liter fluid.  Patient looks more alert and interactive.  She is got some tremor going on the daughter says is due for her Parkinson's meds.  Still waiting on a urine.  Will order more IV fluids.   [MB]  1507 Patient's received 2 L of IV fluids.  Her urinalysis was unremarkable other than ketones.  Rest of her lab work did not show any significant findings.  She would like to go home and her daughter is comfortable taking her home.  She understands she needs to drink some more and eat some food.  We will trial her on some Zofran in case she is nauseous.   [MB]    Clinical Course User Index [MB] Terrilee Files, MD     Final Clinical Impressions(s) / ED Diagnoses   Final diagnoses:  Weakness generalized  Dehydration    ED Discharge Orders    None       Terrilee Files, MD 01/07/19 1728

## 2019-01-07 NOTE — Discharge Instructions (Addendum)
You were seen in the emergency department for decreased fluid and food intake and increased generalized weakness.  You seemed improved after getting some IV fluids.  Will be important for you to get an appointment with your primary care doctor for further evaluation.  We are sending you home with a prescription for some nausea medication.  Please return if any concerns.

## 2019-01-07 NOTE — Telephone Encounter (Signed)
Spoke with Dara Hoyer, RN with [facility] for referral to [facility] RN. She will look into this and see if she is able to take patient. She recommends HH social work as well. Kinnie Feil, RN, BSN

## 2019-01-07 NOTE — ED Notes (Signed)
XR at bedside

## 2019-01-07 NOTE — ED Notes (Signed)
Patient verbalizes understanding of discharge instructions. Opportunity for questioning and answers were provided. Armband removed by staff, pt discharged from ED. Pt wheeled to lobby and taken home by family 

## 2019-01-08 ENCOUNTER — Other Ambulatory Visit: Payer: Self-pay | Admitting: Family Medicine

## 2019-01-08 LAB — URINE CULTURE
Culture: NO GROWTH
Special Requests: NORMAL

## 2019-01-08 MED ORDER — CARBIDOPA-LEVODOPA 25-100 MG PO TBDP: 2.0000 | ORAL_TABLET | Freq: Three times a day (TID) | ORAL | 11 refills | Status: AC

## 2019-01-08 NOTE — Telephone Encounter (Signed)
Done

## 2019-01-09 ENCOUNTER — Telehealth: Payer: Self-pay

## 2019-01-09 ENCOUNTER — Other Ambulatory Visit: Payer: Self-pay | Admitting: *Deleted

## 2019-01-09 NOTE — Addendum Note (Signed)
Addended by: [name] on: 01/09/2019 01:00 PM   Modules accepted: Orders

## 2019-01-09 NOTE — Patient Outreach (Signed)
[facility]) Care Management  01/09/2019  Nicole Baxter January 10, 1941 [id number]   Telephone Screen  Referral Date: 01/09/2019 Referral Source: [facility] referral  Referral Reason: "** Phone [phone]- Nicole Baxter** Member is staying with her son, Nicole Baxter , for assistance. He husband had a kidney transplant and unable to assist. Son mentions the member needs [facility] assistance and possible transportation assistance until her husband is able to drive again."-High referral [facility] [employer]: HTA  No admission in last 6 months but with an ED visit on 01/07/2019 for generalized weakness  Outreach attempt # 1 successful to Nicole Baxter number Patient's son, Nicole Baxter is able to verify HIPAA Reviewed and addressed referral to [facility] with patient's son Nicole Baxter CM notes Epic notes indicating [facility] had been consulted by primary MD office  Nicole Baxter confirms the referral information is correct He also states they (he nor Nicole Baxter) have not heard from a home health agency at the time of the call  CM assessed needs for home health vs personal care services CM reviewed the differences in home health and personal care services related to duration in the home, cost and coverage Nicole Baxter voiced understanding and states Nicole Baxter may need personal care services He inquired about cost again.  CM called [facility] with Nicole Baxter on conference call. No one was reached and a message was left for Nicole Baxter to call Nicole Baxter to assist with answers to types of services and cost CM discussed [facility] services also can be offered via [facility]  [facility] RN CM called and spoke with Nicole [facility] at [facility] to who states there has not been a referral or orders sent to them for the patient   [facility] RN CM spoke with Nicole Baxter at Nicole Baxter office who states orders were given to Nicole Baxter, [facility] in house coordinator, on 01/07/2019 ans a message was left for there referral RN Nicole Baxter [facility] RN CM spoke with Nicole [facility] of [facility] to discuss the home health orders for Nicole Baxter She and CM reviewed Epic notes together. Verbal order needed from Nicole Baxter for [facility] PT for this patient.Nicole Baxter to assist with this   Social: Presently Nicole Baxter is living with her son, Nicole Baxter at his home She has also support from her daughter, Nicole Baxter. There is other children but they live out of town in [place] etc  Presently her husband is recovering from kidney transplant and is not able to care for her  Nicole Baxter reports he or Nicole Baxter assists Nicole Baxter to medical appointments   Conditions: Dementia, GERD, HDL, HTN, occupational lung disease, obesity,  parkinson's, headaches, fall a month ago, hyperglycemia, tremor, DM type 2 DME walker   Medications: denies concerns with taking medications as prescribed, affording medications, side effects of medications and questions about medications   Appointments: none noted   Advance Directives: Denies need for assist with advance directives  Nicole Baxter is unsure if Nicole Baxter has POA   Consent: [facility] RN CM reviewed [facility] services with patient. Patient gave verbal consent for services.  Plan: [facility] RN CM will refer Nicole Senseney to [facility] for assistance with Transportation resources and possible personal care services  [facility] RN Cm will follow up with Nicole Baxter within the next 4 business days to check on the status of home health and personal care services.   Pt encouraged Nicole Baxter to return a call to [facility] RN CM prn  [facility] RN CM sent a successful outreach letter as discussed with [facility] brochure enclosed for  review   L. Nicole Penner, RN, BSN, CCM [facility] Telephonic Care Management Care Coordinator Office number [phone] Mobile number [phone]  Main [facility] number [phone] Fax number [phone]

## 2019-01-09 NOTE — Telephone Encounter (Signed)
I put in a order for home health PT. Thank you

## 2019-01-09 NOTE — Telephone Encounter (Signed)
Nicole Baxter with [facility] requesting to speak with you about a home health referral.

## 2019-01-09 NOTE — Telephone Encounter (Signed)
[facility] is unable to take patient for [facility] nursing at this time but recommends changing to [facility] PT 2/2 falls. States nursing would be if patient needs med management or disease process management but PT can help with fall prevention. Will route to Attending Pool as PCP is unavailable. Kinnie Feil, RN, BSN

## 2019-01-10 ENCOUNTER — Other Ambulatory Visit: Payer: Self-pay | Admitting: *Deleted

## 2019-01-10 NOTE — Patient Outreach (Signed)
[facility]) Care Management  01/10/2019  Nicole Baxter 05/07/1941 [id number]   HTA UM referral follow up and telephonic RN CM case closure  Referral Date: 01/09/2019 Referral Source: HTA UM referral  Referral Reason: "** Phone [phone]- Nicole Baxter** Member is staying with her son, Nicole Baxter , for assistance. He husband had a kidney transplant and unable to assist. Son mentions the member needs [facility] and possible transportation assistance until her husband is able to drive again."-High referral A [employer] Insurance: HTA  No admission in last 6 months but with an ED visit on 01/07/2019 for generalized weakness  Patient's daughter Nicole Baxter [facility] CM  Nicole Baxter is able to verify HIPAA  Nicole Baxter reports she is HPOA and has POA at Nicole Baxter bank Nicole Baxter has dementia and Parkinson's Reviewed and addressed [facility] as Nicole Baxter had given Nicole Baxter this Cm's number to call without an update of the contact with him on 01/09/2019 "He just handed me an envelope with numbers on it to call back to"  CM rechecked Epic notes indicating [facility] by primary MD office (Dr Heide SparkNarendra) as of 01/09/2019 pm [facility]. Discussed this with Nicole Baxter At this time eresa has not heard from [facility]. Cm provided [facility]'s office contact number if there is no call by 01/11/2019  CM reviewed the differences in home health and personal care services related to duration in the home, cost and coverage Nicole Baxter voiced understanding and states Nicole Baxter may need personal care services  CM informed Nicole Baxter that CM called ADTS with Nicole Baxter on conference call. No one was reached and a message was left for Nicole Baxter to call Nicole Baxter to assist with answers to types of services and cost CM discussed [facility] services also can be offered via ADTS  On 01/09/2019 Cm referred Nicole [facility] for assistance with Transportation resources and possible personal  care services, This was reviewed with Nicole Baxter. CM discussed [facility] and [facility]TS with Nicole Baxter. CM provided contact information. Nicole Baxter believes she has already received a contact number for Nicole Baxter and states she will call Nicole Baxter to inquire about personal care services. She has particular questions to ask ADTS staff  [facility] RN CM called and spoke with [name] of [facility] to update her that Nicole Baxter is now at [place]. [name] confirms receiving HH PT order and is able to see Nicole's address and contact number in Epic   [facility] RN CM notified [facility] SW, Nicole Baxter via in basket that Nicole Baxter is now at Nicole's home and provided Nicole's contact number. Notified her that Nicole Baxter is HPOA for Nicole Baxter  Social: Presently as of 01/10/2019 Nicole Baxter is living with her daughter,  Nicole Baxter [place] [address] [place] but the goal per Nicole Baxter is to get Nicole Baxter back to her home with her husband Nicole Baxter who is recovering from kidney transplant. Nicole Baxter also feels this is important because Nicole Baxter has dementia and is familiar with her home. The inconsistency of going from one home to another has caused some concerns recently per Nicole Baxter. Nicole Baxter has also support from her son, Nicole Baxter. There is other children but they live [place] of town in [place] Nicole Baxter states she is presently working at home to assist with Nicole Baxter but unsure of the duration. She states she will be taking Nicole Baxter to her home on the upcoming weekend to see Nicole Baxter as  he is now at Nicole Baxter' home. Nicole Baxter or Nicole Baxter assists Nicole Baxter to medical appointments at this time Nicole Baxter is needing assistance with all her care needs  Conditions: Dementia, GERD, HDL, HTN, occupational lung disease, obesity,  parkinson's, headaches, fall a month ago, hyperglycemia, tremor, DM type 2 DME walker   Medications: denies concerns with taking medications as prescribed, affording medications, side effects of medications and questions about  medications   Appointments: none noted at this tme  Advance Directives:  Nicole Baxter reports she is HPOA and has POA at Nicole Baxter bank  Consent: [facility] RN CM reviewed [facility] services with patient. Patient gave verbal consent for services for telephonic [facility] RN CM and  SW.  Plan[facility] CM will close case at this time as patient has been assessed and no further needs identified, home health is set up and Nicole Baxter states all that is needed is for care services until Nicole Baxter has recovered.  CM did discuss with Nicole Baxter services for [facility] and [facility] She was given the contact number for [facility] Nicole states she is not sure the services will be needed but she is glad to know about them  Pt encouraged Nicole Baxter to return a call to [facility] RN CM prn  [facility] RN CM informed Nicole Baxter that  successful outreach letter  with [facility] brochure enclosed for review was sent to Nicole Baxter' address  Nicole BradfordKimberly L. Noelle PennerGibbs, RN, BSN, CCM [facility] Telephonic Care Management Care Coordinator Office number [phone] Mobile number [phone]  Main [facility] number [phone] Fax number [phone]

## 2019-01-11 NOTE — Telephone Encounter (Addendum)
Placed call to Dara Hoyer, RN with [facility] to learn start of service date for [facility] PT and notify that, per chart review, [facility] has also sent referral for [facility] SW to assist with transportation issues and PCS. Message left on secure VM requesting return call. Kinnie Feil, RN, BSN

## 2019-01-11 NOTE — Telephone Encounter (Signed)
Tiffany returned call. States [facility] for [facility] PT will be Sunday, 01/13/2019. [facility] in contact with [facility] regarding HH SW. Kinnie Feil, RN, BSN

## 2019-01-14 ENCOUNTER — Telehealth: Payer: Self-pay

## 2019-01-14 NOTE — Telephone Encounter (Signed)
Nicole Baxter with [facility] want to informed the doctor, the start of care is delayed because the pt is moving two different location.

## 2019-01-14 NOTE — Telephone Encounter (Signed)
Thank you :)

## 2019-01-14 NOTE — Telephone Encounter (Signed)
Noted, will send to laurend. Just in case she needs to know

## 2019-01-15 ENCOUNTER — Other Ambulatory Visit: Payer: Self-pay

## 2019-01-15 NOTE — Patient Outreach (Signed)
[facility]) Care Management  01/15/2019  Nicole MOLDENHAUER [birth year] [id number]   BSW received return call from patient's daughter, Nicole Baxter social work referral for transportation and in-home aide resources.  Patient is currently living with Ms. Arville Care in [facility].  Ms. Arville Care denied the need for transportation at this time.  She has been in communication with Etheleen Nicks at [facility] about in-home aide services. Patient has an upcoming [facility] with [facility].  BSW encouraged Mrs. Arville Care to inquire about CNA services being ordered in conjunction with PT.  BSW explained that these services are often only covered by Medicare if in conjunction with a skilled service so aide services would be on a private pay basis after discharge from PT.   Ms. Arville Care also reported that she is awaiting a call back from [facility] about that program. BSW inquired about whether or not patient has ever applied for Medicaid.  BSW informed Ms Arville Care of income limit and she reported that patient is over that limit.  She did request that BSW mail her the application, however.   BSW will follow up with her within the next two weeks to ensure receipt of Medicaid application and get an update on in-home services.   Malachy Chamber, BSW Social Worker [phone]

## 2019-01-15 NOTE — Patient Outreach (Signed)
[facility]) Care Management  01/15/2019  KEANDREA HINKLE February 15, 1941 [id number]   Unsuccessful outreach to patient's daughter and [facility] POA, Chilton Si, regarding social work referral for transportation and in-home assistance.  BSW left voicemail message.  Will attempt to reach again within four business days.  Malachy Chamber, BSW Social Worker [phone]

## 2019-01-17 ENCOUNTER — Ambulatory Visit: Payer: PPO

## 2019-01-17 ENCOUNTER — Telehealth: Payer: Self-pay

## 2019-01-17 NOTE — Telephone Encounter (Signed)
Noted. Will send to pcp.Kingsley Spittle Cassady2/20/202010:33 AM

## 2019-01-17 NOTE — Telephone Encounter (Signed)
Nicole Baxter with [facility] want to informed the doctor, the start of care for PT will be 01/18/2019.

## 2019-01-17 NOTE — Telephone Encounter (Signed)
Thank you :)

## 2019-01-17 NOTE — Telephone Encounter (Signed)
Received call from Tiffany, RN with [facility]. States patient is being taken care of by 3 of her children; one lives in [place], one in [place] and one in [place]. She only stays a few days in each place. As such [facility] is not able to see patient for [facility] PT on a regular basis. They will open PT so they can get a social worker to her to see what can be done about this situation. Kinnie Feil, RN, BSN

## 2019-01-23 ENCOUNTER — Encounter: Payer: Self-pay | Admitting: Internal Medicine

## 2019-01-23 NOTE — Telephone Encounter (Signed)
Thank you :)

## 2019-01-23 NOTE — Telephone Encounter (Signed)
Received call from Tiffany with [facility]. States she was able to coordinate [facility] PT in all 3 counties that patient is residing in with children on a weekly basis. However, son has declined service as he believes patient is back to baseline. Tiffany will keep case opened in case son changes his mind. Kinnie Feil, RN, BSN

## 2019-01-24 ENCOUNTER — Other Ambulatory Visit: Payer: Self-pay

## 2019-01-24 NOTE — Patient Outreach (Signed)
[facility]) Care Management  01/24/2019  ISATA WOOLF 1940-12-02 [id number]   Follow up call to patient's daughter, Chilton Si.  Ms. Arville Care confirmed receipt of Medicaid application that was mailed.  Ms. Arville Care reported that she and her brother have been providing care for patient.  They have had difficulty securing an in-home aide.  She and her brother are currently discussing placement for patient.  BSW agreed to send referral to CSW to assist in navigating the process of placement.  BSW signing off at this time.   Malachy Chamber, BSW Social Worker [phone]

## 2019-01-27 DIAGNOSIS — E860 Dehydration: Secondary | ICD-10-CM

## 2019-01-27 HISTORY — DX: Dehydration: E86.0

## 2019-01-29 ENCOUNTER — Ambulatory Visit (INDEPENDENT_AMBULATORY_CARE_PROVIDER_SITE_OTHER): Payer: PPO | Admitting: Internal Medicine

## 2019-01-29 ENCOUNTER — Other Ambulatory Visit: Payer: Self-pay

## 2019-01-29 ENCOUNTER — Encounter: Payer: Self-pay | Admitting: Internal Medicine

## 2019-01-29 DIAGNOSIS — F0281 Dementia in other diseases classified elsewhere with behavioral disturbance: Secondary | ICD-10-CM | POA: Diagnosis not present

## 2019-01-29 DIAGNOSIS — G20 Parkinson's disease: Secondary | ICD-10-CM

## 2019-01-29 DIAGNOSIS — E02 Subclinical iodine-deficiency hypothyroidism: Secondary | ICD-10-CM | POA: Diagnosis not present

## 2019-01-29 DIAGNOSIS — E038 Other specified hypothyroidism: Secondary | ICD-10-CM

## 2019-01-29 DIAGNOSIS — I10 Essential (primary) hypertension: Secondary | ICD-10-CM

## 2019-01-29 DIAGNOSIS — E039 Hypothyroidism, unspecified: Secondary | ICD-10-CM

## 2019-01-29 DIAGNOSIS — F0280 Dementia in other diseases classified elsewhere without behavioral disturbance: Secondary | ICD-10-CM

## 2019-01-29 DIAGNOSIS — Z79899 Other long term (current) drug therapy: Secondary | ICD-10-CM | POA: Diagnosis not present

## 2019-01-29 DIAGNOSIS — E785 Hyperlipidemia, unspecified: Secondary | ICD-10-CM

## 2019-01-29 NOTE — Patient Instructions (Addendum)
-  Was a pleasure seeing you today -Please try melatonin at night to try and help you sleep -Please follow-up with neurology as an outpatient -I do believe that you would benefit from placement in a facility where you have more supervised care -I will stop your cholesterol medication -Please follow-up with me in a month -Please call me if you need anything or have any questions

## 2019-01-30 NOTE — Assessment & Plan Note (Signed)
-  This problem is chronic and stable -Patient with no symptoms of hypo-or hyperthyroidism currently -We will recheck her TSH on her follow-up visit

## 2019-01-30 NOTE — Assessment & Plan Note (Signed)
-  This problem is chronic and stable -Patient follows up with Dr. Daisy Blossom for this and was seen in her office in February -Patient was noted to have worsening Parkinson's disease despite treatment -An infectious etiology was ruled out for this in the ED at the time -I suspect that the patient has progressively worsening Parkinson's disease and will likely be a candidate for hospice referral soon -We will continue with carbidopa/levodopa 25/100 mg 2 tabs 3 times daily per neurology recommendations -No further work-up at this time

## 2019-01-30 NOTE — Progress Notes (Signed)
   Subjective:    Patient ID: Nicole Baxter, female    DOB: 1941-09-10, 78 y.o.   MRN: [id number]  HPI  I have seen and examined this patient.  Patient is here with her family today to discuss possible placement in a nursing facility.  She is also here for follow-up of her hypertension and hyperlipidemia.  Patient family states that patient is compliant with all her medications.  The family is concerned that at night the patient gets agitated and hallucinates.  The patient's husband recently underwent a kidney transplant and the patient has been moving between her 3 children every couple of days.  No fevers or chills, no cough, no other signs of an infection.  Patient states that she feels well.  Family also states that she has not been eating well and is losing weight at home.  Review of Systems  Constitutional: Positive for appetite change.       Decreased appetite and oral intake  HENT: Negative.   Respiratory: Negative.   Cardiovascular: Negative.   Gastrointestinal: Negative.   Musculoskeletal: Negative.   Neurological: Negative.   Psychiatric/Behavioral: Positive for agitation and hallucinations.       Objective:   Physical Exam Constitutional:      Appearance: Normal appearance.  HENT:     Head: Normocephalic and atraumatic.     Mouth/Throat:     Mouth: Mucous membranes are moist.     Pharynx: Oropharynx is clear. No oropharyngeal exudate.  Neck:     Musculoskeletal: Neck supple.  Cardiovascular:     Rate and Rhythm: Normal rate and regular rhythm.     Heart sounds: Normal heart sounds.  Pulmonary:     Effort: Pulmonary effort is normal.     Breath sounds: Normal breath sounds. No wheezing or rhonchi.  Abdominal:     General: Bowel sounds are normal. There is no distension.     Palpations: Abdomen is soft.     Tenderness: There is no abdominal tenderness.  Musculoskeletal: Normal range of motion.        General: No swelling.  Lymphadenopathy:     Cervical: No  cervical adenopathy.  Neurological:     General: No focal deficit present.     Mental Status: She is alert. Mental status is at baseline.  Psychiatric:        Mood and Affect: Mood normal.        Behavior: Behavior normal.     Comments: Family states that patient has hallucinations and is agitated at night with insomnia as well.  Currently patient is at baseline and recognizes her children as well as her granddaughter and denies any issues currently           Assessment & Plan:  Please see problem discharging for assessment and plan:

## 2019-01-30 NOTE — Assessment & Plan Note (Signed)
BP Readings from Last 3 Encounters:  01/29/19 118/75  01/07/19 (!) 143/73  01/07/19 129/77    Lab Results  Component Value Date   NA 145 01/07/2019   K 3.5 01/07/2019   CREATININE 0.97 01/07/2019    Assessment: Blood pressure control:  Well controlled Progress toward BP goal:   At goal Comments: Patient blood pressure remains well controlled off all medications  Plan: Medications:  Not on medications currently Educational resources provided:   Self management tools provided:   Other plans:

## 2019-01-30 NOTE — Assessment & Plan Note (Addendum)
-  Patient is now having periods of agitation and hallucinations at night as well as a reversal of her sleep-wake cycle.  Family is concerned that patient is awake all night and asleep during the day. 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  The daughters are in agreement that patient would likely receive better care in a nursing facility and would not have to change her surroundings so often. -I explained to the daughters that there is a lot of guilt associated with placing a parent in a nursing facility but that this was likely the best option for her given the amount of care that she needs and her worsening Parkinson's disease as well as Parkinson dementia. -I also explained to the family that patient would likely need to be placed in a memory care unit in a facility which also has hospice facilities as I suspect that over the course of the next couple of months she may need to be placed on inpatient hospice. -Family will look for places and get in contact with me to fill out any paperwork that may be necessary -We will continue with [employer] for now

## 2019-01-30 NOTE — Assessment & Plan Note (Signed)
-  This problem is chronic and stable -Given patient's worsening dementia and her poor overall prognosis I do not think that there is much benefit to continuing her Lipitor at this time. -Discussed this with the patient and the family who are in agreement with stopping her Lipitor -No further work-up at this time

## 2019-01-31 ENCOUNTER — Telehealth: Payer: Self-pay | Admitting: Internal Medicine

## 2019-01-31 NOTE — Telephone Encounter (Signed)
[name] calling about the [facility] for the mother; pls call back [phone]

## 2019-02-04 NOTE — Telephone Encounter (Signed)
Thank you. Will look it over and try to complete it tomorrow

## 2019-02-04 NOTE — Telephone Encounter (Addendum)
Returned call to daughter then spoke with Essie Hart, Admissions Coordinator at [facility] in [place]. Stanton Kidney will fax necessary forms to be completed by PCP. Will also need to send current med list and most recent Hx and physical. Once completed will need to be faxed to Debra at [phone]. Kinnie Feil, RN, BSN

## 2019-02-04 NOTE — Telephone Encounter (Signed)
Pt's daughter requesting FL2 form to be completed, the daughter would like to placed the mother in [facility] nursing home. Please call pt's daughter back.

## 2019-02-05 NOTE — Telephone Encounter (Signed)
Gavin Pound returned call. Clarified all items under Recipient Information. Long Term Care FL2 Form, OV notes from 01/29/2019 and current med list faxed to Gavin Pound at [phone]. Daughter notified. Kinnie Feil, RN, BSN

## 2019-02-05 NOTE — Telephone Encounter (Addendum)
Received completed form from PCP. Call placed to [facility]. LM requesting return call. Kinnie Feil, RN, BSN

## 2019-02-05 NOTE — Telephone Encounter (Addendum)
Second call placed to Gavin Pound to ensure form is completed to their specifications. Left message requesting return call. Kinnie Feil, RN, BSN

## 2019-02-06 ENCOUNTER — Emergency Department (HOSPITAL_COMMUNITY): Payer: PPO

## 2019-02-06 ENCOUNTER — Inpatient Hospital Stay (HOSPITAL_COMMUNITY)
Admission: EM | Admit: 2019-02-06 | Discharge: 2019-02-13 | DRG: 641 | Disposition: A | Discharge status: Hospice | Payer: PPO | Attending: Internal Medicine | Admitting: Internal Medicine

## 2019-02-06 ENCOUNTER — Telehealth: Payer: Self-pay | Admitting: *Deleted

## 2019-02-06 ENCOUNTER — Encounter (HOSPITAL_COMMUNITY): Payer: Self-pay

## 2019-02-06 ENCOUNTER — Other Ambulatory Visit: Payer: Self-pay

## 2019-02-06 DIAGNOSIS — F05 Delirium due to known physiological condition: Secondary | ICD-10-CM | POA: Diagnosis not present

## 2019-02-06 DIAGNOSIS — R8271 Bacteriuria: Secondary | ICD-10-CM | POA: Diagnosis present

## 2019-02-06 DIAGNOSIS — J449 Chronic obstructive pulmonary disease, unspecified: Secondary | ICD-10-CM | POA: Diagnosis not present

## 2019-02-06 DIAGNOSIS — E860 Dehydration: Secondary | ICD-10-CM | POA: Diagnosis not present

## 2019-02-06 DIAGNOSIS — R4182 Altered mental status, unspecified: Secondary | ICD-10-CM | POA: Diagnosis not present

## 2019-02-06 DIAGNOSIS — K219 Gastro-esophageal reflux disease without esophagitis: Secondary | ICD-10-CM | POA: Diagnosis present

## 2019-02-06 DIAGNOSIS — H35033 Hypertensive retinopathy, bilateral: Secondary | ICD-10-CM | POA: Diagnosis present

## 2019-02-06 DIAGNOSIS — Z96652 Presence of left artificial knee joint: Secondary | ICD-10-CM | POA: Diagnosis present

## 2019-02-06 DIAGNOSIS — Z86010 Personal history of colonic polyps: Secondary | ICD-10-CM | POA: Diagnosis not present

## 2019-02-06 DIAGNOSIS — Z7189 Other specified counseling: Secondary | ICD-10-CM

## 2019-02-06 DIAGNOSIS — M1990 Unspecified osteoarthritis, unspecified site: Secondary | ICD-10-CM | POA: Diagnosis present

## 2019-02-06 DIAGNOSIS — J660 Byssinosis: Secondary | ICD-10-CM | POA: Diagnosis present

## 2019-02-06 DIAGNOSIS — Z79899 Other long term (current) drug therapy: Secondary | ICD-10-CM

## 2019-02-06 DIAGNOSIS — E785 Hyperlipidemia, unspecified: Secondary | ICD-10-CM | POA: Diagnosis not present

## 2019-02-06 DIAGNOSIS — R451 Restlessness and agitation: Secondary | ICD-10-CM | POA: Diagnosis not present

## 2019-02-06 DIAGNOSIS — Z66 Do not resuscitate: Secondary | ICD-10-CM | POA: Diagnosis not present

## 2019-02-06 DIAGNOSIS — G20 Parkinson's disease: Secondary | ICD-10-CM | POA: Diagnosis present

## 2019-02-06 DIAGNOSIS — Z515 Encounter for palliative care: Secondary | ICD-10-CM

## 2019-02-06 DIAGNOSIS — R740 Nonspecific elevation of levels of transaminase and lactic acid dehydrogenase [LDH]: Secondary | ICD-10-CM | POA: Diagnosis not present

## 2019-02-06 DIAGNOSIS — N3000 Acute cystitis without hematuria: Secondary | ICD-10-CM | POA: Diagnosis not present

## 2019-02-06 DIAGNOSIS — Z8249 Family history of ischemic heart disease and other diseases of the circulatory system: Secondary | ICD-10-CM

## 2019-02-06 DIAGNOSIS — R4701 Aphasia: Secondary | ICD-10-CM | POA: Diagnosis present

## 2019-02-06 DIAGNOSIS — Z811 Family history of alcohol abuse and dependence: Secondary | ICD-10-CM

## 2019-02-06 DIAGNOSIS — M1711 Unilateral primary osteoarthritis, right knee: Secondary | ICD-10-CM | POA: Diagnosis not present

## 2019-02-06 DIAGNOSIS — K0510 Chronic gingivitis, plaque induced: Secondary | ICD-10-CM | POA: Diagnosis present

## 2019-02-06 DIAGNOSIS — I10 Essential (primary) hypertension: Secondary | ICD-10-CM | POA: Diagnosis not present

## 2019-02-06 DIAGNOSIS — R4586 Emotional lability: Secondary | ICD-10-CM | POA: Diagnosis present

## 2019-02-06 DIAGNOSIS — F0280 Dementia in other diseases classified elsewhere without behavioral disturbance: Secondary | ICD-10-CM | POA: Diagnosis not present

## 2019-02-06 DIAGNOSIS — R000 Tachycardia, unspecified: Secondary | ICD-10-CM | POA: Diagnosis not present

## 2019-02-06 DIAGNOSIS — R4020 Unspecified coma: Secondary | ICD-10-CM | POA: Diagnosis not present

## 2019-02-06 HISTORY — DX: Dehydration: E86.0

## 2019-02-06 HISTORY — DX: Adverse effect of unspecified anesthetic, initial encounter: T41.45XA

## 2019-02-06 HISTORY — DX: Other complications of anesthesia, initial encounter: T88.59XA

## 2019-02-06 LAB — COMPREHENSIVE METABOLIC PANEL
ALT: 37 U/L (ref 0–44)
AST: 27 U/L (ref 15–41)
Albumin: 3.9 g/dL (ref 3.5–5.0)
Alkaline Phosphatase: 91 U/L (ref 38–126)
Anion gap: 9 (ref 5–15)
BILIRUBIN TOTAL: 2.4 mg/dL — AB (ref 0.3–1.2)
BUN: 17 mg/dL (ref 8–23)
CO2: 22 mmol/L (ref 22–32)
Calcium: 9.5 mg/dL (ref 8.9–10.3)
Chloride: 111 mmol/L (ref 98–111)
Creatinine, Ser: 0.86 mg/dL (ref 0.44–1.00)
GFR calc Af Amer: >60 mL/min (ref >60)
GFR calc non Af Amer: >60 mL/min (ref >60)
GLUCOSE: 130 mg/dL — AB (ref 70–99)
Potassium: 3.5 mmol/L (ref 3.5–5.1)
Sodium: 142 mmol/L (ref 135–145)
TOTAL PROTEIN: 7.9 g/dL (ref 6.5–8.1)

## 2019-02-06 LAB — URINALYSIS, ROUTINE W REFLEX MICROSCOPIC
Bilirubin Urine: NEGATIVE
GLUCOSE, UA: NEGATIVE mg/dL
Hgb urine dipstick: NEGATIVE
Ketones, ur: 20 mg/dL — AB
Nitrite: NEGATIVE
PROTEIN: NEGATIVE mg/dL
Specific Gravity, Urine: 1.028 (ref 1.005–1.030)
pH: 5 (ref 5.0–8.0)

## 2019-02-06 LAB — POCT I-STAT EG7
Acid-Base Excess: 2 mmol/L (ref 0.0–2.0)
Bicarbonate: 27.6 mmol/L (ref 20.0–28.0)
Calcium, Ion: 1.28 mmol/L (ref 1.15–1.40)
HCT: 40 % (ref 36.0–46.0)
Hemoglobin: 13.6 g/dL (ref 12.0–15.0)
O2 Saturation: 47 %
Potassium: 4.1 mmol/L (ref 3.5–5.1)
Sodium: 144 mmol/L (ref 135–145)
TCO2: 29 mmol/L (ref 22–32)
pCO2, Ven: 45 mmHg (ref 44.0–60.0)
pH, Ven: 7.396 (ref 7.250–7.430)
pO2, Ven: 26 mmHg — CL (ref 32.0–45.0)

## 2019-02-06 LAB — CBC
HCT: 43.7 % (ref 36.0–46.0)
Hemoglobin: 13.7 g/dL (ref 12.0–15.0)
MCH: 31.9 pg (ref 26.0–34.0)
MCHC: 31.4 g/dL (ref 30.0–36.0)
MCV: 101.9 fL — ABNORMAL HIGH (ref 80.0–100.0)
Platelets: 185 10*3/uL (ref 150–400)
RBC: 4.29 MIL/uL (ref 3.87–5.11)
RDW: 13.1 % (ref 11.5–15.5)
WBC: 8.8 10*3/uL (ref 4.0–10.5)
nRBC: 0 % (ref 0.0–0.2)

## 2019-02-06 LAB — AMMONIA: Ammonia: 18 umol/L (ref 9–35)

## 2019-02-06 LAB — CBG MONITORING, ED: Glucose-Capillary: 129 mg/dL — ABNORMAL HIGH (ref 70–99)

## 2019-02-06 LAB — LACTIC ACID, PLASMA: Lactic Acid, Venous: 2.9 mmol/L (ref 0.5–1.9)

## 2019-02-06 MED ORDER — ACETAMINOPHEN 325 MG PO TABS
650.0000 mg | ORAL_TABLET | Freq: Four times a day (QID) | ORAL | Status: DC | PRN
  Administered 2019-02-09 – 2019-02-10 (×2): 650 mg via ORAL
  Filled 2019-02-06 (×2): qty 2

## 2019-02-06 MED ORDER — ONDANSETRON HCL 4 MG PO TABS: 4.0000 mg | ORAL_TABLET | Freq: Four times a day (QID) | ORAL | Status: DC | PRN

## 2019-02-06 MED ORDER — CARBIDOPA-LEVODOPA 25-100 MG PO TBDP: 2.0000 | ORAL_TABLET | Freq: Three times a day (TID) | ORAL | Status: DC

## 2019-02-06 MED ORDER — CARBIDOPA-LEVODOPA 25-100 MG PO TABS
2.0000 | ORAL_TABLET | Freq: Three times a day (TID) | ORAL | Status: DC
  Filled 2019-02-06 (×2): qty 2

## 2019-02-06 MED ORDER — RIVASTIGMINE TARTRATE 1.5 MG PO CAPS
6.0000 mg | ORAL_CAPSULE | Freq: Two times a day (BID) | ORAL | Status: DC
  Administered 2019-02-07 – 2019-02-12 (×10): 6 mg via ORAL
  Filled 2019-02-06 (×14): qty 4

## 2019-02-06 MED ORDER — ACETAMINOPHEN 650 MG RE SUPP: 650.0000 mg | Freq: Four times a day (QID) | RECTAL | Status: DC | PRN

## 2019-02-06 MED ORDER — SODIUM CHLORIDE 0.9 % IV SOLN
INTRAVENOUS | Status: AC
  Administered 2019-02-06 – 2019-02-07 (×2): via INTRAVENOUS

## 2019-02-06 MED ORDER — SODIUM CHLORIDE 0.9% FLUSH
3.0000 mL | Freq: Once | INTRAVENOUS | Status: AC
  Administered 2019-02-06: 3 mL via INTRAVENOUS

## 2019-02-06 MED ORDER — ENOXAPARIN SODIUM 40 MG/0.4ML ~~LOC~~ SOLN: 40.0000 mg | SUBCUTANEOUS | Status: DC

## 2019-02-06 MED ORDER — LACTATED RINGERS IV BOLUS
1000.0000 mL | Freq: Once | INTRAVENOUS | Status: AC
  Administered 2019-02-06: 1000 mL via INTRAVENOUS

## 2019-02-06 MED ORDER — SODIUM CHLORIDE 0.9 % IV SOLN
1.0000 g | Freq: Once | INTRAVENOUS | Status: AC
  Administered 2019-02-06: 1 g via INTRAVENOUS
  Filled 2019-02-06: qty 10

## 2019-02-06 MED ORDER — ENOXAPARIN SODIUM 30 MG/0.3ML ~~LOC~~ SOLN
30.0000 mg | SUBCUTANEOUS | Status: DC
  Administered 2019-02-07 – 2019-02-09 (×4): 30 mg via SUBCUTANEOUS
  Filled 2019-02-06 (×5): qty 0.3

## 2019-02-06 MED ORDER — ONDANSETRON HCL 4 MG/2ML IJ SOLN: 4.0000 mg | Freq: Four times a day (QID) | INTRAMUSCULAR | Status: DC | PRN

## 2019-02-06 NOTE — Telephone Encounter (Signed)
I agree

## 2019-02-06 NOTE — ED Provider Notes (Signed)
[facility] EMERGENCY DEPARTMENT Provider Note   CSN: [id number] Arrival date & time: 02/06/19  1437    History   Chief Complaint Chief Complaint  Patient presents with  . Altered Mental Status    HPI Nicole Baxter is a 78 y.o. female.     HPI Patient is here with her husband.  Patient has significant Parkinson's disease with dementia as per review of recent PCP notes.  Patient's husband is a limited historian.  He reports that he had surgery recently and has not been with the patient a whole lot recently.  He has called his son to ask questions.  The main complaint is that she has just stopped eating and drinking for about 2 days now.  They deny there is been any vomiting.  No known diarrhea.  No fever that they are aware of.  No appearance of coughing or difficulty breathing.  Patient is an extremely limited historian.  She mumbles some responses here and there.  Hard to tell how accurate they are.  She is denying any pain.  Other than that no real useful answers.  Review of EMR indicates that family members have been working with PCP for imminent placement in nursing home care.  This is been due to increasing dementia with substantial confusion and sundowning.  Per EMR note, patient is moved every few days between family members as caregivers. Past Medical History:  Diagnosis Date  . Arthritis   . Dementia (HCC)   . GERD (gastroesophageal reflux disease)    pt. states she no longer has GERD (09/11/15)  . Hyperglycemia    isolated FBS 122, 95, 104 (Random 138)  . Hyperlipidemia    on pravastatin  . Hypertension    controlled on 2 agents  . 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 Obesity   . Parkinson's disease (tremor, stiffness, slow motion, unstable posture) (HCC)   . Shortness of breath dyspnea    pt. states that she no longer has SOB )09/11/15  . Tremor     Patient Active Problem List   Diagnosis Date Noted  . Hypotension 07/16/2018   . Fatigue 06/05/2018  . Mild intermittent asthma, uncomplicated 10/31/2017  . Weight loss, unintentional 07/11/2017  . Chronic bilateral low back pain without sciatica 12/19/2016  . 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 Hypertensive retinopathy of both eyes, grade 2 01/15/2016  . 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 History of colon polyps 12/25/2014  . Health care maintenance 02/13/2014  . Subclinical hypothyroidism 12/25/2013  . Right renal mass 06/11/2012  . Osteoarthritis of both knees 03/15/2012  . Parkinsonism (HCC) 07/13/2010  . ALLERGIC RHINITIS, SEASONAL 02/20/2007  . Hyperlipidemia 09/27/2006  . Essential hypertension 09/27/2006  . GERD 09/27/2006    Past Surgical History:  Procedure Laterality Date  . CESAREAN SECTION    . COLONOSCOPY    . TOTAL KNEE ARTHROPLASTY Left 09/23/2015   Procedure: LEFT TOTAL KNEE ARTHROPLASTY;  Surgeon: Loreta Ave, MD;  Location: [facility] OR;  Service: Orthopedics;  Laterality: Left;     OB History   No obstetric history on file.      Home Medications    Prior to Admission medications   Medication Sig Start Date End Date Taking? Authorizing Provider  albuterol (PROVENTIL HFA;VENTOLIN HFA) 108 (90 Base) MCG/ACT inhaler Inhale 2 puffs into the lungs every 6 (six) hours as needed for wheezing or shortness of breath. 10/31/17  Yes Nedrud,  Jeanella Flattery, MD  carbidopa-levodopa (PARCOPA) 25-100 MG disintegrating tablet Take 2 tablets by mouth 3 (three) times daily. 01/08/19  Yes Lomax, Amy, NP  cyanocobalamin ([employer] VITAMIN B12) 1000 MCG tablet Take 1 tablet (1,000 mcg total) by mouth daily. 02/08/17  Yes [name], MD  rivastigmine (EXELON) 6 MG capsule Take 1 capsule (6 mg total) by mouth 2 (two) times daily. 06/18/18  Yes Anson Fret, MD  ondansetron (ZOFRAN) 4 MG tablet Take 1 tablet (4 mg total) by mouth every 8 (eight) hours as needed for nausea or vomiting. Patient not taking: Reported on  02/06/2019 01/07/19   Terrilee Files, MD    Family History Family History  Problem Relation Age of Onset  . Heart disease Mother 75       died of MI @ 74  . Alcohol abuse Father   . Cancer Brother   . Colon cancer Neg Hx     Social History Social History   Tobacco Use  . Smoking status: Never Smoker  . Smokeless tobacco: Never Used  Substance Use Topics  . Alcohol use: No    Alcohol/week: 0.0 standard drinks  . Drug use: No     Allergies   Patient has no known allergies.   Review of Systems Review of Systems Level 5 caveat unable to obtain review of systems due to dementia.  Physical Exam Updated Vital Signs BP (!) 134/95   Pulse 75   Temp 98.8 F (37.1 C) (Oral)   Resp (!) 21   SpO2 98%   Physical Exam Constitutional:      Comments: Patient is nontoxic in appearance.  Her color is good.  She does not exhibit respiratory distress.  Patient is keeping her eyes closed quite a bit.  She requires a lot of encouragement from her husband to open her eyes.  She does kind of mumble some difficult to understand responses  HENT:     Head: Normocephalic and atraumatic.     Mouth/Throat:     Mouth: Mucous membranes are moist.     Pharynx: Oropharynx is clear.     Comments: Lower dentition has significant plaque buildup and gingivitis.  Posterior airway widely patent.  Mucous membranes are moist. Eyes:     Extraocular Movements: Extraocular movements intact.     Conjunctiva/sclera: Conjunctivae normal.  Neck:     Musculoskeletal: Neck supple.  Cardiovascular:     Rate and Rhythm: Normal rate and regular rhythm.     Pulses: Normal pulses.     Heart sounds: Normal heart sounds.  Pulmonary:     Effort: Pulmonary effort is normal.     Breath sounds: Normal breath sounds.  Abdominal:     General: There is no distension.     Palpations: Abdomen is soft.     Tenderness: There is no abdominal tenderness.  Musculoskeletal:     Comments: Patient is holding her arms  folded on her chest and clasping them fairly tightly.  She will release them to allow exam of her chest and abdomen.  Is hard to get her to follow commands for any strength testing.  Lower extremities are in good condition.  There is no peripheral edema.  Feet are in good condition.  No wounds.  Skin is warm and dry.  Dorsalis pedis pulses intact.  Skin:    General: Skin is warm and dry.  Neurological:     Comments: Cannot assess for any orientation as patient does not answer questions with meaningful responses.  Patient does not appear to have a focal motor deficit in terms of any flaccid paralysis.  She is actually fairly stiff generally.  Trying to roll her on her side she feels rather boardlike.  Her husband corroborates that this stiffness has been an ongoing notable symptom to them.  Psychiatric:     Comments: Affect is very flat.      ED Treatments / Results  Labs (all labs ordered are listed, but only abnormal results are displayed) Labs Reviewed  COMPREHENSIVE METABOLIC PANEL - Abnormal; Notable for the following components:      Result Value   Glucose, Bld 130 (*)    Total Bilirubin 2.4 (*)    All other components within normal limits  CBC - Abnormal; Notable for the following components:   MCV 101.9 (*)    All other components within normal limits  URINALYSIS, ROUTINE W REFLEX MICROSCOPIC - Abnormal; Notable for the following components:   Color, Urine AMBER (*)    Ketones, ur 20 (*)    Leukocytes,Ua MODERATE (*)    Bacteria, UA RARE (*)    All other components within normal limits  LACTIC ACID, PLASMA - Abnormal; Notable for the following components:   Lactic Acid, Venous 2.9 (*)    All other components within normal limits  CBG MONITORING, ED - Abnormal; Notable for the following components:   Glucose-Capillary 129 (*)    All other components within normal limits  POCT I-STAT EG7 - Abnormal; Notable for the following components:   pO2, Ven 26.0 (*)    All other  components within normal limits  URINE CULTURE  AMMONIA  BLOOD GAS, VENOUS  LACTIC ACID, PLASMA  CBG MONITORING, ED    EKG EKG Interpretation  Date/Time:  Wednesday February 06 2019 15:17:24 EDT Ventricular Rate:  103 PR Interval:  134 QRS Duration: 78 QT Interval:  350 QTC Calculation: 458 R Axis:   31 Text Interpretation:  Sinus tachycardia T wave abnormality, consider inferolateral ischemia Abnormal ECG early r wave progression compared tp previous nonspecific anterior t wave change Confirmed by Arby Barrette [phone]) on 02/06/2019 3:33:10 PM   Radiology Ct Head Wo Contrast  Result Date: 02/06/2019 CLINICAL DATA:  Altered level of consciousness. EXAM: CT HEAD WITHOUT CONTRAST TECHNIQUE: Contiguous axial images were obtained from the base of the skull through the vertex without intravenous contrast. COMPARISON:  MRI head 11/09/2015 FINDINGS: Brain: Image quality degraded by motion. Overall the ventricles are within normal limits for size. There is asymmetric dilatation of the right temporal horn which is unchanged from the prior study. Negative for acute infarct, hemorrhage, mass. Vascular: Negative for hyperdense vessel Skull: Negative Sinuses/Orbits: Negative Other: None IMPRESSION: No acute abnormality. Electronically Signed   By: Marlan Palau M.D.   On: 02/06/2019 19:30   Dg Chest Port 1 View  Result Date: 02/06/2019 CLINICAL DATA:  Altered mental status EXAM: PORTABLE CHEST 1 VIEW COMPARISON:  01/07/2019 FINDINGS: Lungs are clear.  No pleural effusion or pneumothorax. The heart is top-normal in size. Degenerative changes of the visualized thoracolumbar spine. IMPRESSION: No evidence of acute cardiopulmonary disease. Electronically Signed   By: Charline Bills M.D.   On: 02/06/2019 16:54    Procedures Procedures (including critical care time)  Medications Ordered in ED Medications  cefTRIAXone (ROCEPHIN) 1 g in sodium chloride 0.9 % 100 mL IVPB (has no administration in  time range)  sodium chloride flush (NS) 0.9 % injection 3 mL (3 mLs Intravenous Given 02/06/19 1828)  lactated ringers  bolus 1,000 mL (0 mLs Intravenous Stopped 02/06/19 2108)     Initial Impression / Assessment and Plan / ED Course  I have reviewed the triage vital signs and the nursing notes.  Pertinent labs & imaging results that were available during my care of the patient were reviewed by me and considered in my medical decision making (see chart for details).  Clinical Course as of Feb 06 2119  Wed Feb 06, 2019  2119 Consult: Reviewed with internal medicine teaching service accepts for admission.   [MP]    Clinical Course User Index [MP] Arby BarrettePfeiffer, Khamille Beynon, MD       Patient with symptoms of mental status change, decreased oral intake.  Urinalysis positive.  Suspect UTI.  Will admit to internal medicine teaching service for mental status change with dementia, UTI and dehydration.  Final Clinical Impressions(s) / ED Diagnoses   Final diagnoses:  Altered mental status, unspecified altered mental status type  Acute cystitis without hematuria  Dementia due to Parkinson's disease without behavioral disturbance [facility](HCC)  Dehydration    ED Discharge Orders    None       Arby BarrettePfeiffer, Atticus Wedin, MD 02/06/19 2120

## 2019-02-06 NOTE — H&P (Signed)
Date: 02/06/2019               Patient Name:  Nicole Baxter MRN: [id number]  DOB: 1941-09-25 Age / Sex: 78 y.o., female   PCP: [name], MD         Medical Service: Internal Medicine Teaching Service         Attending Physician: Dr. Oswaldo Done, Marquita Palms, *    First Contact: Dr. Maryla Morrow Pager: [phone]  Second Contact: Dr. Antony Contras Pager: [phone]       After Hours (After 5p/  First Contact Pager: [phone]  weekends / holidays): Second Contact Pager: [phone]   Chief Complaint: decreased po intake  History of Present Illness: 77yoF with advanced parkinson's disease dementia who presents with 3 days of decreased po intake.   On Sunday the patient started refusing to eat, drink, or take her medications. The last time she ate was on Sunday. Since then, she has also had progressive difficulty speaking to the point that now, her words are not comprehensible. She has had issues with this in the past and these episodes have become more frequent over the last month after she moved in with family. Today, she was noted to be very stiff in bed and refusing to move. This is what prompted family to bring her to the hospital. She currently alternates between the daughter and her son's house because her husband recently had a kidney transplant and can't care for her. They have noticed she seems to have more bad than good days. On a good day she is able to recognize her children and speak in complete sentences. She often forgets that she needs a walker to ambulate and will try to ambulate on her own. The daughter also notes that she's been having worsening hallucinations over the past month. Discussions have been had with her PCP and the family is coming to term with placing her in a nursing facility.   Denies n/v/d, fevers, chills, pain.     Meds:  No current facility-administered medications on file prior to encounter.    Current Outpatient Medications on File Prior to Encounter   Medication Sig Dispense Refill  . albuterol (PROVENTIL HFA;VENTOLIN HFA) 108 (90 Base) MCG/ACT inhaler Inhale 2 puffs into the lungs every 6 (six) hours as needed for wheezing or shortness of breath. 1 Inhaler 2  . carbidopa-levodopa (PARCOPA) 25-100 MG disintegrating tablet Take 2 tablets by mouth 3 (three) times daily. 180 tablet 11  . cyanocobalamin ([employer] VITAMIN B12) 1000 MCG tablet Take 1 tablet (1,000 mcg total) by mouth daily. 30 tablet 1  . rivastigmine (EXELON) 6 MG capsule Take 1 capsule (6 mg total) by mouth 2 (two) times daily. 180 capsule 6  . ondansetron (ZOFRAN) 4 MG tablet Take 1 tablet (4 mg total) by mouth every 8 (eight) hours as needed for nausea or vomiting. (Patient not taking: Reported on 02/06/2019) 20 tablet 0    Current Meds  Medication Sig  . albuterol (PROVENTIL HFA;VENTOLIN HFA) 108 (90 Base) MCG/ACT inhaler Inhale 2 puffs into the lungs every 6 (six) hours as needed for wheezing or shortness of breath.  . carbidopa-levodopa (PARCOPA) 25-100 MG disintegrating tablet Take 2 tablets by mouth 3 (three) times daily.  . cyanocobalamin ([employer] VITAMIN B12) 1000 MCG tablet Take 1 tablet (1,000 mcg total) by mouth daily.  . rivastigmine (EXELON) 6 MG capsule Take 1 capsule (6 mg total) by mouth 2 (two) times daily.     Allergies: Allergies as  of 02/06/2019  . (No Known Allergies)   Past Medical History:  Diagnosis Date  . Arthritis   . Dementia (HCC)   . GERD (gastroesophageal reflux disease)    pt. states she no longer has GERD (09/11/15)  . Hyperglycemia    isolated FBS 122, 95, 104 (Random 138)  . Hyperlipidemia    on pravastatin  . Hypertension    controlled on 2 agents  . 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 Obesity   . Parkinson's disease (tremor, stiffness, slow motion, unstable posture) (HCC)   . Shortness of breath dyspnea    pt. states that she no longer has SOB )09/11/15  . Tremor     Family History: 4 aunts/uncles with possible  Parkinson's (never formally diagnosed) Family History  Problem Relation Age of Onset  . Heart disease Mother 81       died of MI @ 51  . Alcohol abuse Father   . Cancer Brother   . Colon cancer Neg Hx      Social History: Married. Goes back and forth between daughter and son's house since her husband had a kidney transplant in early Feb 2020. Used to work in the Dentist at [employer]. Has 5 children.   Review of Systems: A complete ROS was negative except as per HPI.   Physical Exam: Blood pressure (!) 159/96, pulse 88, temperature 98.8 F (37.1 C), temperature source Oral, resp. rate (!) 23, SpO2 100 %. Gen: elderly, frail lady in NAD. Alert, cooperative with exam HENT: dry MM, no eye discharge or redness Cardiac: RRR, no m/r/g Pulm: CTAB, normal wob Abd: soft, NT, ND, +BS. No suprapubic tenderness Ext: warm, no LEE, cogwheel rigidity of bilateral upper extremities Neuro: Alert but orientation is difficult to assess given her aphasia. CN 2-12 grossly intact, EOMI, unable to test sensation, 5/5 grip strength and upper extremity strength, able to raise left leg off the bed, unable to raise right leg but is able to move both feet equally  Labs: CBC: unremarkable CMP: t. Bili 2.4 UA: rare bacteria, mod leukocytes, 11-20 wbc, 20 ketones Lactate 2.9 VBG: pH 7.39, pCO2 45, bicarb 27  CT Head: no acute abnormality   EKG: personally reviewed my interpretation is sinus tach HR 103, t wave inversions V3-5  CXR: personally reviewed my interpretation is normal cardiac silhouette, no pulmonary consolidation/effusion/pneumothorax.   Assessment & Plan by Problem: Active Problems:   Dehydration  78 y.o. female p/w 2-3 days of decreased PO intake, aphasia, difficulty ambulating.   End-Stage Parkinson's: Progressive decline over past 1 year with increasing visual and auditory hallucinations. Frequent food/med strikes. Has not taken anything by mouth since Sunday. Initial workup  does not show any convincing signs of infection or acute neurological abnormality. Family is working towards getting her placed at a nursing facility. Have been having goals of care discussions with Dr. Heide Spark.  - IVFs: NS 75cc/hr for 20 hrs - regular diet despite risk for aspiration - continue home sinemet and rivastigmine. If she continues to refuse po, we will need to discuss with family the option of short term NG tube to administer Parkinson medications in hope of improving her functional abilities  - Family states that Ms. Hodge would not want a feeding tube - PT eval - social work consult for SNF placement   Elevated Lactate: likely due to dehydration - IVFs - repeat lactate  Possible UTI: received one dose of ceftriaxone in the ED. UA not  convincing for infection - discontinue further antibiotics at this time  - f/u urine culture   DVT ppx: lovenox Diet: regular  Code: DNR, confirmed with family   Dispo: Admit patient to Inpatient with expected length of stay greater than 2 midnights.  Signed: Ali Lowe, MD 02/06/2019, 10:57 PM  Pager: [phone]

## 2019-02-06 NOTE — ED Notes (Signed)
Dr. Donnald Garre aware of venous blood gas

## 2019-02-06 NOTE — Telephone Encounter (Signed)
Pt's daughter calls and states per her brother pt has not taken any food or fluid po in 2 days, questioned anything and she states anything, she states pt is weak and lethargic. She is advised to call 911 and have pt brought to [facility] for eval, she is agreeable with plan

## 2019-02-06 NOTE — ED Triage Notes (Signed)
Pt BIB family due to pt not eating or taking medications for the past 2 days. Pt sitting in wheelchair with eyes closed, pt will follow commands but will not speak. Family states she has been that way for 2 days.

## 2019-02-06 NOTE — ED Notes (Addendum)
ED TO INPATIENT HANDOFF REPORT  ED Nurse Name and Phone #: Baird Lyons [phone]  S Name/Age/Gender Nicole Baxter 78 y.o. female Room/Bed: 029C/029C  Code Status   Code Status: DNR  Home/SNF/Other Home Patient oriented to: self, Pt typically able to communicate per daughter at bedside. Initially nonverbal. Pt has become more alert since arrival, now able to answer some questions. Is this baseline? No   Triage Complete: Triage complete  Chief Complaint Altered Mental Status  Triage Note Pt BIB family due to pt not eating or taking medications for the past 2 days. Pt sitting in wheelchair with eyes closed, pt will follow commands but will not speak. Family states she has been that way for 2 days.    Allergies No Known Allergies  Level of Care/Admitting Diagnosis ED Disposition    ED Disposition Condition Comment   Admit  Hospital Area: [facility] [[id number]]  Level of Care: Med-Surg [16]  Diagnosis: Dehydration [276.51.ICD-9-CM]  Admitting Physician: Tyson Alias [[id number]]  Attending Physician: Tyson Alias [[id number]]  Estimated length of stay: 3 - 4 days  Certification:: I certify this patient will need inpatient services for at least 2 midnights  PT Class (Do Not Modify): Inpatient [101]  PT Acc Code (Do Not Modify): Private [1]       B Medical/Surgery History Past Medical History:  Diagnosis Date  . Arthritis   . Dementia (HCC)   . GERD (gastroesophageal reflux disease)    pt. states she no longer has GERD (09/11/15)  . Hyperglycemia    isolated FBS 122, 95, 104 (Random 138)  . Hyperlipidemia    on pravastatin  . Hypertension    controlled on 2 agents  . 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 Obesity   . Parkinson's disease (tremor, stiffness, slow motion, unstable posture) (HCC)   . Shortness of breath dyspnea    pt. states that she no longer has SOB )09/11/15  . Tremor    Past Surgical History:  Procedure  Laterality Date  . CESAREAN SECTION    . COLONOSCOPY    . TOTAL KNEE ARTHROPLASTY Left 09/23/2015   Procedure: LEFT TOTAL KNEE ARTHROPLASTY;  Surgeon: Loreta Ave, MD;  Location: [facility] OR;  Service: Orthopedics;  Laterality: Left;     A IV Location/Drains/Wounds Patient Lines/Drains/Airways Status   Active Line/Drains/Airways    Name:   Placement date:   Placement time:   Site:   Days:   Peripheral IV 02/06/19 Right Hand   02/06/19    1839    Hand   less than 1          Intake/Output Last 24 hours  Intake/Output Summary (Last 24 hours) at 02/06/2019 2258 Last data filed at 02/06/2019 2228 Gross per 24 hour  Intake 1100 ml  Output -  Net 1100 ml    Labs/Imaging Results for orders placed or performed during the hospital encounter of 02/06/19 (from the past 48 hour(s))  CBG monitoring, ED     Status: Abnormal   Collection Time: 02/06/19  2:45 PM  Result Value Ref Range   Glucose-Capillary 129 (H) 70 - 99 mg/dL  Comprehensive metabolic panel     Status: Abnormal   Collection Time: 02/06/19  3:14 PM  Result Value Ref Range   Sodium 142 135 - 145 mmol/L   Potassium 3.5 3.5 - 5.1 mmol/L   Chloride 111 98 - 111 mmol/L   CO2 22 22 - 32 mmol/L  Glucose, Bld 130 (H) 70 - 99 mg/dL   BUN 17 8 - 23 mg/dL   Creatinine, Ser 4.09 0.44 - 1.00 mg/dL   Calcium 9.5 8.9 - 81.1 mg/dL   Total Protein 7.9 6.5 - 8.1 g/dL   Albumin 3.9 3.5 - 5.0 g/dL   AST 27 15 - 41 U/L   ALT 37 0 - 44 U/L   Alkaline Phosphatase 91 38 - 126 U/L   Total Bilirubin 2.4 (H) 0.3 - 1.2 mg/dL   GFR calc non Af Amer >60 >60 mL/min   GFR calc Af Amer >60 >60 mL/min   Anion gap 9 5 - 15    Comment: Performed at [facility], [address] [place], [place] [address]  CBC     Status: Abnormal   Collection Time: 02/06/19  3:14 PM  Result Value Ref Range   WBC 8.8 4.0 - 10.5 K/uL   RBC 4.29 3.87 - 5.11 MIL/uL   Hemoglobin 13.7 12.0 - 15.0 g/dL   HCT 29.5 62.1 - 30.8 %   MCV 101.9 (H) 80.0 - 100.0 fL    MCH 31.9 26.0 - 34.0 pg   MCHC 31.4 30.0 - 36.0 g/dL   RDW 65.7 84.6 - 96.2 %   Platelets 185 150 - 400 K/uL   nRBC 0.0 0.0 - 0.2 %    Comment: Performed at [facility], [address], [place], [place] [address]  Ammonia     Status: None   Collection Time: 02/06/19  4:41 PM  Result Value Ref Range   Ammonia 18 9 - 35 umol/L    Comment: Performed at [facility], [address], [place], [place] [address]  Lactic acid, plasma     Status: Abnormal   Collection Time: 02/06/19  4:41 PM  Result Value Ref Range   Lactic Acid, Venous 2.9 (HH) 0.5 - 1.9 mmol/L    Comment: CRITICAL RESULT CALLED TO, READ BACK BY AND VERIFIED WITH: C Shambria Camerer,RN 1727 02/06/2019 D BRADLEY Performed at [facility], [address] [place], [place] [address]   POCT I-Stat EG7     Status: Abnormal   Collection Time: 02/06/19  5:08 PM  Result Value Ref Range   pH, Ven 7.396 7.250 - 7.430   pCO2, Ven 45.0 44.0 - 60.0 mmHg   pO2, Ven 26.0 (LL) 32.0 - 45.0 mmHg   Bicarbonate 27.6 20.0 - 28.0 mmol/L   TCO2 29 22 - 32 mmol/L   O2 Saturation 47.0 %   Acid-Base Excess 2.0 0.0 - 2.0 mmol/L   Sodium 144 135 - 145 mmol/L   Potassium 4.1 3.5 - 5.1 mmol/L   Calcium, Ion 1.28 1.15 - 1.40 mmol/L   HCT 40.0 36.0 - 46.0 %   Hemoglobin 13.6 12.0 - 15.0 g/dL   Patient temperature HIDE    Sample type VENOUS    Comment NOTIFIED PHYSICIAN   Urinalysis, Routine w reflex microscopic     Status: Abnormal   Collection Time: 02/06/19  6:38 PM  Result Value Ref Range   Color, Urine AMBER (A) YELLOW    Comment: BIOCHEMICALS MAY BE AFFECTED BY COLOR   APPearance CLEAR CLEAR   Specific Gravity, Urine 1.028 1.005 - 1.030   pH 5.0 5.0 - 8.0   Glucose, UA NEGATIVE NEGATIVE mg/dL   Hgb urine dipstick NEGATIVE NEGATIVE   Bilirubin Urine NEGATIVE NEGATIVE   Ketones, ur 20 (A) NEGATIVE mg/dL   Protein, ur NEGATIVE NEGATIVE  mg/dL   Nitrite NEGATIVE NEGATIVE   Leukocytes,Ua MODERATE (A) NEGATIVE   RBC / HPF 0-5 0 -  5 RBC/hpf   WBC, UA 11-20 0 - 5 WBC/hpf   Bacteria, UA RARE (A) NONE SEEN   Squamous Epithelial / LPF 0-5 0 - 5    Comment: Performed at [facility], [address] [place], [place] [id number]   Ct Head Wo Contrast  Result Date: 02/06/2019 CLINICAL DATA:  Altered level of consciousness. EXAM: CT HEAD WITHOUT CONTRAST TECHNIQUE: Contiguous axial images were obtained from the base of the skull through the vertex without intravenous contrast. COMPARISON:  MRI head 11/09/2015 FINDINGS: Brain: Image quality degraded by motion. Overall the ventricles are within normal limits for size. There is asymmetric dilatation of the right temporal horn which is unchanged from the prior study. Negative for acute infarct, hemorrhage, mass. Vascular: Negative for hyperdense vessel Skull: Negative Sinuses/Orbits: Negative Other: None IMPRESSION: No acute abnormality. Electronically Signed   By: Marlan Palauharles  Clark M.D.   On: 02/06/2019 19:30   Dg Chest Port 1 View  Result Date: 02/06/2019 CLINICAL DATA:  Altered mental status EXAM: PORTABLE CHEST 1 VIEW COMPARISON:  01/07/2019 FINDINGS: Lungs are clear.  No pleural effusion or pneumothorax. The heart is top-normal in size. Degenerative changes of the visualized thoracolumbar spine. IMPRESSION: No evidence of acute cardiopulmonary disease. Electronically Signed   By: Charline BillsSriyesh  Krishnan M.D.   On: 02/06/2019 16:54    Pending Labs Unresulted Labs (From admission, onward)    Start     Ordered   02/06/19 1540  Lactic acid, plasma  Now then every 2 hours,   STAT     02/06/19 1539   02/06/19 1539  Urine culture  ONCE - STAT,   STAT     02/06/19 1539          Vitals/Pain Today's Vitals   02/06/19 2000 02/06/19 2015 02/06/19 2100 02/06/19 2115  BP: (!) 159/87 (!) 164/93 (!) 134/95 (!) 159/96  Pulse:  84 75 88  Resp: 16 (!) 21  (!) 23  Temp:      TempSrc:      SpO2:  98% 98% 100%  PainSc:        Isolation Precautions No active  isolations  Medications Medications  rivastigmine (EXELON) capsule 6 mg (has no administration in time range)  acetaminophen (TYLENOL) tablet 650 mg (has no administration in time range)    Or  acetaminophen (TYLENOL) suppository 650 mg (has no administration in time range)  ondansetron (ZOFRAN) tablet 4 mg (has no administration in time range)    Or  ondansetron (ZOFRAN) injection 4 mg (has no administration in time range)  0.9 %  sodium chloride infusion ( Intravenous New Bag/Given 02/06/19 2229)  carbidopa-levodopa (SINEMET IR) 25-100 MG per tablet immediate release 2 tablet (has no administration in time range)  enoxaparin (LOVENOX) injection 30 mg (has no administration in time range)  sodium chloride flush (NS) 0.9 % injection 3 mL (3 mLs Intravenous Given 02/06/19 1828)  lactated ringers bolus 1,000 mL (0 mLs Intravenous Stopped 02/06/19 2108)  cefTRIAXone (ROCEPHIN) 1 g in sodium chloride 0.9 % 100 mL IVPB (0 g Intravenous Stopped 02/06/19 2228)    Mobility Ambulatory at home, has not been up her, needed assistance with transferring from wheelchair to stretcher.  High fall risk   Focused Assessments Neuro Assessment Handoff:     NIH Stroke Scale ( + Modified Stroke Scale Criteria)  Level of Consciousness (1a.)   :  Not alert, but arousable by minor stimulation to obey, answer, or respond LOC Questions (1b. )   +: Answers neither question correctly LOC Commands (1c. )   + : Performs one task correctly Visual (3. )  +: No visual loss Facial Palsy (4. )    : Normal symmetrical movements Motor Arm, Left (5a. )   +: Amputation or joint fusion(pt unable to follow commands) Motor Arm, Right (5b. )   +: Amputation or joint fusion Motor Leg, Left (6a. )   +: Amputation or joint fusion Motor Leg, Right (6b. )   +: Amputation or joint fusion Limb Ataxia (7. ): Amputation or joint fusion Sensory (8. )   +: Normal, no sensory loss Best Language (9. )   +: Mute, global  aphasia Dysarthria (10. ): Severe dysarthria, patient's speech is so slurred as to be unintelligible in the absence of or out of proportion to any dysphasia, or is mute/anarthric Extinction/Inattention (11.)   +: No Abnormality          R Recommendations: See Admitting Provider Note  Report given to:   Additional Notes: daughter and husband at bedside

## 2019-02-06 NOTE — ED Notes (Signed)
Patient transported to CT 

## 2019-02-07 ENCOUNTER — Encounter (HOSPITAL_COMMUNITY): Payer: Self-pay | Admitting: General Practice

## 2019-02-07 DIAGNOSIS — Z66 Do not resuscitate: Secondary | ICD-10-CM

## 2019-02-07 DIAGNOSIS — Z7189 Other specified counseling: Secondary | ICD-10-CM

## 2019-02-07 DIAGNOSIS — Z79899 Other long term (current) drug therapy: Secondary | ICD-10-CM

## 2019-02-07 DIAGNOSIS — R740 Nonspecific elevation of levels of transaminase and lactic acid dehydrogenase [LDH]: Secondary | ICD-10-CM

## 2019-02-07 LAB — URINE CULTURE

## 2019-02-07 LAB — LACTIC ACID, PLASMA: Lactic Acid, Venous: 1 mmol/L (ref 0.5–1.9)

## 2019-02-07 MED ORDER — ENSURE ENLIVE PO LIQD
237.0000 mL | Freq: Two times a day (BID) | ORAL | Status: DC
  Administered 2019-02-07 – 2019-02-08 (×2): 237 mL via ORAL

## 2019-02-07 MED ORDER — CARBIDOPA-LEVODOPA 25-100 MG PO TABS
2.0000 | ORAL_TABLET | Freq: Three times a day (TID) | ORAL | Status: DC
  Administered 2019-02-07 – 2019-02-13 (×17): 2 via ORAL
  Filled 2019-02-07 (×17): qty 2

## 2019-02-07 NOTE — Evaluation (Signed)
Physical Therapy Evaluation Patient Details Name: Nicole Baxter MRN: [id number] DOB: 1941/05/04 Today's Date: 02/07/2019   History of Present Illness  78 yo female with advanced parkinson's disease dementia who presented to ED with 3 days of decreased po intake. She was admitted for treatment of dehydration.  Clinical Impression  Pt received in bed, willing to participate in therapy; family in room. History per family, who reports pt has had multiple falls recently and they are concerned that they are no longer able to provide the care she needs at home. Functional mobility with Mod-MaxA (see below for details).  Recommending SNF for post-acute rehab to safely progress pt's mobility and prevent falls in the home. She will continue to benefit from skilled acute PT services in order to improve her functional mobility and ensure safe DC.    Follow Up Recommendations SNF;Supervision/Assistance - 24 hour    Equipment Recommendations  Other (comment)(defer to next venue)    Recommendations for Other Services       Precautions / Restrictions Precautions Precautions: Fall Restrictions Weight Bearing Restrictions: No Other Position/Activity Restrictions: Pt has extensor tone in trunk, leans posteriorly      Mobility  Bed Mobility Overal bed mobility: Needs Assistance Bed Mobility: Supine to Sit;Sit to Supine     Supine to sit: Max assist;HOB elevated Sit to supine: Mod assist;HOB elevated   General bed mobility comments: MaxA for LE management, trunk elevation, and hip scooting towards EOB. ModA for sit to supine for LE management; pt able to walk legs toward middle of bed with verbal, tactile and manual cuing.  Transfers Overall transfer level: Needs assistance Equipment used: Rolling walker (2 wheeled) Transfers: Sit to/from Stand Sit to Stand: Min assist         General transfer comment: MinA for boost from bed and for balance; tactile cues for safe hand  placement  Ambulation/Gait Ambulation/Gait assistance: +2 safety/equipment;Mod assist Gait Distance (Feet): 75 Feet Assistive device: Rolling walker (2 wheeled) Gait Pattern/deviations: Decreased step length - left;Decreased step length - right;Drifts right/left;Narrow base of support;Festinating;Leaning posteriorly Gait velocity: decreased   General Gait Details: ModA+2 for balance due to posterior lean and for direction of RW during ambulation approximately 75 ft with RW. Pt takes short, festinating steps, which improve in fluidity intermittently. Pt required maximal manual cues with directioning of RW for turning and guidance to prevent running into walls.  Stairs            Wheelchair Mobility    Modified Rankin (Stroke Patients Only)       Balance Overall balance assessment: Needs assistance Sitting-balance support: Feet supported;Bilateral upper extremity supported Sitting balance-Leahy Scale: Fair Sitting balance - Comments: Pt able to sit EOB without support once positioning in optimal upright position Postural control: Posterior lean Standing balance support: Bilateral upper extremity supported;During functional activity Standing balance-Leahy Scale: Poor Standing balance comment: Pt required bil UE support and modA of PT to maintain upright during ambulation and standing                             Pertinent Vitals/Pain Pain Assessment: Faces Faces Pain Scale: No hurt    Home Living Family/patient expects to be discharged to:: Skilled nursing facility                 Additional Comments: Pt's family no longer feels safe having her at home due to her increasing physical and cognitive deficits. She alternates staying  at different of her children's homes. She has fallen multiple times with no severe injury.    Prior Function Level of Independence: Needs assistance   Gait / Transfers Assistance Needed: uses rollator for mobility; family reports  needing to walk with her to make sure she does not fall  ADL's / Homemaking Assistance Needed: family assists with ADLs        Hand Dominance        Extremity/Trunk Assessment   Upper Extremity Assessment Upper Extremity Assessment: Defer to OT evaluation    Lower Extremity Assessment Lower Extremity Assessment: Generalized weakness    Cervical / Trunk Assessment Cervical / Trunk Assessment: Other exceptions Cervical / Trunk Exceptions: extensor tone in trunk causes pt to lean posteriorly  Communication   Communication: Expressive difficulties  Cognition Arousal/Alertness: Lethargic Behavior During Therapy: Flat affect Overall Cognitive Status: History of cognitive impairments - at baseline                                 General Comments: Pt able to state her name, but not oriented otherwise. She has Parkinson's with dementia at baseline and family reports she has been having hallucinations and increased confusion.      General Comments      Exercises     Assessment/Plan    PT Assessment Patient needs continued PT services  PT Problem List Decreased strength;Decreased balance;Decreased cognition;Decreased knowledge of precautions;Impaired tone;Pain;Decreased mobility;Decreased knowledge of use of DME;Decreased activity tolerance;Decreased coordination;Decreased safety awareness       PT Treatment Interventions DME instruction;Balance training;Functional mobility training;Patient/family education;Gait training;Therapeutic activities;Neuromuscular re-education;Stair training;Therapeutic exercise;Cognitive remediation    PT Goals (Current goals can be found in the Care Plan section)  Acute Rehab PT Goals Patient Stated Goal: pt did not state; family states for pt to go to SNF for better help PT Goal Formulation: With family Time For Goal Achievement: 02/21/19 Potential to Achieve Goals: Good    Frequency Min 2X/week   Barriers to discharge Other  (comment) fall risk, functional mobility deficits    Co-evaluation               AM-PAC PT "6 Clicks" Mobility  Outcome Measure Help needed turning from your back to your side while in a flat bed without using bedrails?: A Little Help needed moving from lying on your back to sitting on the side of a flat bed without using bedrails?: A Lot Help needed moving to and from a bed to a chair (including a wheelchair)?: A Lot Help needed standing up from a chair using your arms (e.g., wheelchair or bedside chair)?: A Little Help needed to walk in hospital room?: A Lot Help needed climbing 3-5 steps with a railing? : Total 6 Click Score: 13    End of Session Equipment Utilized During Treatment: Gait belt Activity Tolerance: Patient tolerated treatment well Patient left: in bed;with bed alarm set;with call bell/phone within reach;with family/visitor present Nurse Communication: Mobility status PT Visit Diagnosis: Unsteadiness on feet (R26.81);Other symptoms and signs involving the nervous system (R29.898)    Time:  -      Charges:              Halina Andreas, SPT   Halina Andreas 02/07/2019, 1:30 PM

## 2019-02-07 NOTE — Social Work (Signed)
CSW acknowledging consult for SNF placement. Will follow for therapy recommendations. Please order PT/OT assessments needed for SNF placement through [employer].   Octavio Graves, MSW, [facility] Clinical Social Work [phone]

## 2019-02-07 NOTE — Progress Notes (Signed)
   Subjective: Patient appears comfortable, laying in bed. Non verbal, opens eyes to touch. Daughter at bedside has not noticed any improvement.   Objective:  Vital signs in last 24 hours: Vitals:   02/06/19 2115 02/06/19 2200 02/07/19 0002 02/07/19 0637  BP: (!) 159/96 (!) 174/95 (!) 114/100 138/86  Pulse: 88 85 83 78  Resp: (!) 23 20 16 16   Temp:   99 F (37.2 C) 98.5 F (36.9 C)  TempSrc:   Oral Oral  SpO2: 100% 99%  98%  Weight:   49.8 kg   Height:   5\' 4"  (1.626 m)    Physical Exam Constitutional: NAD, appears comfortable HENT: Dry mucous membranes, PERRL Pulmonary/Chest: Breathing comfortable on RA, normal effort  Extremities: Warm and well perfused.  No edema.  Psychiatric: Normal mood and affect   Assessment/Plan:  Active Problems:   Dehydration  Patient is a 78 yo F with advanced parkinson's dementia presenting with decreased PO intake, aphasia, and difficulty ambulating.   End-Stage Parkinson's: Patient has had a slow decline in functional status over the past year. Over the past few days she has become non verbal and refusing food and medications. Work up thus far has not found any acute reversible infectious or metabolic causes for her decline. Overall clinical picture is most consistent with progression of her parkinsonism to end stage. Discussed with daughter that patient is likely at end of life. She is tearful but accepting of this. Agreeable to meet with palliative care.  -- Palliative care consult placed  -- PT eval  -- SW for placement   FEN: NS @ 75 cc/hr, replete lytes prn, regular diet  VTE ppx: Lovenox  Code Status: DNR  Dispo: Anticipated discharge pending plans for discharge.   Reymundo Poll, MD 02/07/2019, 1:04 PM Pager: [phone]

## 2019-02-07 NOTE — Social Work (Addendum)
Continue to await full PT note- able to see SNF recommendation however if pt is not appropriate for rehab would also like PMT input before arranging placement or other disposition. Pt only coverage is [employer].    Doy Hutching, LCSWA [facility] Clinical Social Work [phone]

## 2019-02-08 DIAGNOSIS — G20 Parkinson's disease: Secondary | ICD-10-CM

## 2019-02-08 DIAGNOSIS — Z515 Encounter for palliative care: Secondary | ICD-10-CM

## 2019-02-08 DIAGNOSIS — F0280 Dementia in other diseases classified elsewhere without behavioral disturbance: Secondary | ICD-10-CM

## 2019-02-08 DIAGNOSIS — Z7189 Other specified counseling: Secondary | ICD-10-CM

## 2019-02-08 LAB — BASIC METABOLIC PANEL
ANION GAP: 10 (ref 5–15)
BUN: 7 mg/dL — ABNORMAL LOW (ref 8–23)
CO2: 23 mmol/L (ref 22–32)
Calcium: 9 mg/dL (ref 8.9–10.3)
Chloride: 109 mmol/L (ref 98–111)
Creatinine, Ser: 0.8 mg/dL (ref 0.44–1.00)
GFR calc Af Amer: >60 mL/min (ref >60)
GFR calc non Af Amer: >60 mL/min (ref >60)
Glucose, Bld: 85 mg/dL (ref 70–99)
POTASSIUM: 4.1 mmol/L (ref 3.5–5.1)
Sodium: 142 mmol/L (ref 135–145)

## 2019-02-08 MED ORDER — ADULT MULTIVITAMIN W/MINERALS CH
1.0000 | ORAL_TABLET | Freq: Every day | ORAL | Status: DC
  Administered 2019-02-09 – 2019-02-13 (×5): 1 via ORAL
  Filled 2019-02-08 (×5): qty 1

## 2019-02-08 MED ORDER — LORAZEPAM 2 MG/ML IJ SOLN
0.5000 mg | Freq: Once | INTRAMUSCULAR | Status: AC
  Administered 2019-02-08: 0.5 mg via INTRAVENOUS
  Filled 2019-02-08: qty 1

## 2019-02-08 MED ORDER — ENSURE ENLIVE PO LIQD
237.0000 mL | Freq: Three times a day (TID) | ORAL | Status: DC
  Administered 2019-02-08 – 2019-02-13 (×11): 237 mL via ORAL
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
## 2019-02-08 NOTE — Plan of Care (Signed)
  Problem: Pain Managment: Goal: General experience of comfort will improve Outcome: Progressing   Problem: Skin Integrity: Goal: Risk for impaired skin integrity will decrease Outcome: Progressing   

## 2019-02-08 NOTE — Evaluation (Signed)
Occupational Therapy Evaluation Patient Details Name: Nicole Baxter MRN: [id number] DOB: September 12, 1941 Today's Date: 02/08/2019    History of Present Illness 78 yo female with advanced parkinson's disease dementia who presented to ED with 3 days of decreased po intake. She was admitted for treatment of dehydration.   Clinical Impression   Eval limited due to pt's agitation with family about going to a SNF for rehab. Pt's daughter and husband present this session. Pt requires asssit for all ADLs and baseline per family report. Pt refusing to allow OT to assess ADLs. Agreeable to grooming tasks only. On instruction pt began to scoot to edge of recliner for sit - stand. however became agitated with her family and adamantly refused to stand with OT. Pt's husband reports that pt is able to perform UB ADLs selectively and was able to transfer to toilet with sup PTA. Pt would benefit from acute OT services to address impairments to maximize level of function and safety    Follow Up Recommendations  SNF;Supervision/Assistance - 24 hour    Equipment Recommendations  Other (comment)(TBD at next venue of care)    Recommendations for Other Services       Precautions / Restrictions Precautions Precautions: Fall Restrictions Weight Bearing Restrictions: No Other Position/Activity Restrictions: pt refusing to stand with OT      Mobility Bed Mobility               General bed mobility comments: pt in recliner upon arrival  Transfers     Transfers: (pt refused with OT, Per PT note pt transfers with min A) Sit to Stand: (pt refused with OT, Per PT note pt transfers with min A)         General transfer comment: pt refused standing with OT despite multiple attempts by OT. On command, pt initiated scooting to edge of recliner, but than became agitated with her family and refused to continue    Balance Overall balance assessment: Needs assistance Sitting-balance support: Feet  supported;Bilateral upper extremity supported Sitting balance-Leahy Scale: Fair         Standing balance comment: NT                           ADL either performed or assessed with clinical judgement   ADL                                         General ADL Comments: pt requires asssit for all ADLs and baseline. Pt refusing to allow OT to assess ADLs. Agreeable to grooming tasks only     Vision Patient Visual Report: No change from baseline       Perception     Praxis      Pertinent Vitals/Pain Pain Assessment: No/denies pain Pain Score: 0-No pain Pain Intervention(s): Monitored during session     Hand Dominance Right   Extremity/Trunk Assessment Upper Extremity Assessment Upper Extremity Assessment: Generalized weakness   Lower Extremity Assessment Lower Extremity Assessment: Defer to PT evaluation       Communication Communication Communication: Expressive difficulties   Cognition Arousal/Alertness: Lethargic Behavior During Therapy: Agitated;Flat affect Overall Cognitive Status: History of cognitive impairments - at baseline  General Comments: Pt able to state her name, but not oriented otherwise. She has Parkinson's with dementia at baseline and family reports she has been having hallucinations and increased confusion.   General Comments       Exercises     Shoulder Instructions      Home Living Family/patient expects to be discharged to:: Skilled nursing facility Living Arrangements: Other relatives                               Additional Comments: Pt's family no longer feels safe having her at home due to her increasing physical and cognitive deficits. She alternates staying at different of her children's homes. She has fallen multiple times with no severe injury.      Prior Functioning/Environment Level of Independence: Needs assistance  Gait / Transfers  Assistance Needed: uses rollator for mobility; family reports needing to walk with her to make sure she does not fall ADL's / Homemaking Assistance Needed: family assists with all ADLs and toileting            OT Problem List: Decreased strength;Decreased activity tolerance;Decreased cognition;Decreased knowledge of use of DME or AE;Impaired balance (sitting and/or standing);Decreased safety awareness;Decreased knowledge of precautions      OT Treatment/Interventions: Self-care/ADL training;DME and/or AE instruction;Therapeutic activities;Patient/family education    OT Goals(Current goals can be found in the care plan section) Acute Rehab OT Goals Patient Stated Goal: family states that they wany pt to go to a SNF, pt refusing SNF OT Goal Formulation: With patient/family Time For Goal Achievement: 02/22/19 Potential to Achieve Goals: Good ADL Goals Pt Will Perform Grooming: with set-up;with supervision Pt Will Perform Upper Body Dressing: with supervision;with set-up;sitting Pt Will Transfer to Toilet: with min guard assist;with supervision;ambulating;regular height toilet;bedside commode;grab bars Additional ADL Goal #1: Pt will complete sit - stand transitions with min guard A - sup in prep for grooming tasks at sink and for LB ADLs  OT Frequency: Min 2X/week   Barriers to D/C: Decreased caregiver support          Co-evaluation              AM-PAC OT "6 Clicks" Daily Activity     Outcome Measure Help from another person eating meals?: A Little Help from another person taking care of personal grooming?: A Little Help from another person toileting, which includes using toliet, bedpan, or urinal?: Total Help from another person bathing (including washing, rinsing, drying)?: Total Help from another person to put on and taking off regular upper body clothing?: Total Help from another person to put on and taking off regular lower body clothing?: Total 6 Click Score: 10    End of Session    Activity Tolerance: Treatment limited secondary to agitation Patient left: in chair;with call bell/phone within reach;with chair alarm set;with family/visitor present  OT Visit Diagnosis: Other abnormalities of gait and mobility (R26.89);Muscle weakness (generalized) (M62.81);History of falling (Z91.81);Other symptoms and signs involving cognitive function                Time: 1247-1319 OT Time Calculation (min): 32 min Charges:  OT General Charges $OT Visit: 1 Visit OT Evaluation $OT Eval Moderate Complexity: 1 Mod OT Treatments $Self Care/Home Management : 8-22 mins    Galen Manila 02/08/2019, 2:10 PM

## 2019-02-08 NOTE — Discharge Summary (Addendum)
Name: Nicole Baxter MRN: [id number] DOB: 1941/04/28 78 y.o. PCP: [name], MD  Date of Admission: 02/06/2019  2:49 PM Date of Discharge: 02/13/19  Attending Physician: Anne Shutter, MD  Discharge Diagnosis: 1. Principal Problem:   Parkinsonism (HCC) Active Problems:   Dementia due to Parkinson's disease without behavioral disturbance (HCC)   Dehydration   Goals of care, counseling/discussion   Palliative care by specialist   Acute cystitis without hematuria   Discharge Medications: Allergies as of 02/13/2019   No Known Allergies     Medication List    STOP taking these medications   ondansetron 4 MG tablet Commonly known as:  ZOFRAN     TAKE these medications   albuterol 108 (90 Base) MCG/ACT inhaler Commonly known as:  PROVENTIL HFA;VENTOLIN HFA Inhale 2 puffs into the lungs every 6 (six) hours as needed for wheezing or shortness of breath.   carbidopa-levodopa 25-100 MG disintegrating tablet Commonly known as:  PARCOPA Take 2 tablets by mouth 3 (three) times daily.   cyanocobalamin 1000 MCG tablet Commonly known as:  [employer] Vitamin B12 Take 1 tablet (1,000 mcg total) by mouth daily.   feeding supplement (ENSURE ENLIVE) Liqd Take 237 mLs by mouth 3 (three) times daily between meals.   multivitamin with minerals Tabs tablet Take 1 tablet by mouth daily.   rivastigmine 6 MG capsule Commonly known as:  EXELON Take 1 capsule (6 mg total) by mouth 2 (two) times daily.       Disposition and follow-up:   [name] was discharged from [facility] in Stable condition.  At the hospital follow up visit please address:  1.  Patient presented with decreased p.o. intake and altered mental status, likely in setting of progressive end-stage Parkinson disease.  She has been treated with IV fluid and continuing carbidopa L-dopa. She is alert and asymptomatic at discharge.  She is discharged to inpatient hospice.  2.  Labs / imaging  needed at time of follow-up: None  3.  Pending labs/ test needing follow-up: None  Follow-up Appointments: As needed   Hospital Course by problem list: End stage parkinson disease: 52.  78 year old female has medical history of Parkinson's disease dementia to the hospital due to decreased PO intake and refusing her medications and food for the past few weeks.  Per family, patient's functional status has declined over last year. On arrival she had altered mental status and mildly dehydrated, and had moderate WBC on UI and suspicious U/A, and received one dose of IV Ceftriaxone at ED. urine culture grew multiple urogenital flora, not consistent with a UTI.  No reversible cause for her functional status decline was identified. CT head with no acute intracranial abnormality.  She had a brief and temporary improvement in her overall mental status after IV rehydration, but continued to have very poor intake, especially of liquids.  We discussed her care plan with her son and daughter, and we all agreed that due to her inability to maintain her own nutritional and fluid intake, she has likely reached the last stage of her Parkinson's disease.  We have discharged her to inpatient hospice.   Discharge Vitals:   BP 129/76 (BP Location: Left Arm)   Pulse 67   Temp 97.6 F (36.4 C) (Oral)   Resp 16   Ht 5\' 4"  (1.626 m)   Wt 49.8 kg   SpO2 98%   BMI 18.85 kg/m   Pertinent Labs, Studies, and Procedures:  Labs on arrival:   02-06-2019 lactic acid: 2.9--> 1 02-06-2019 U/A: Moderate WBC, bacteria: Rare, ketone 20 02-06-2019 U/C: Multiple species  CBC Latest Ref Rng & Units 02/06/2019 02/06/2019 01/07/2019  WBC 4.0 - 10.5 K/uL - 8.8 6.5  Hemoglobin 12.0 - 15.0 g/dL 36.6 44.0 34.7  Hematocrit 36.0 - 46.0 % 40.0 43.7 42.3  Platelets 150 - 400 K/uL - 185 250  CT head without contrast 02/06/2019 No acute abnormality. BMP Latest Ref Rng & Units 02/12/2019 02/10/2019 02/08/2019  Glucose 70 - 99 mg/dL 92 82 85   BUN 8 - 23 mg/dL 12 8 7(L)  Creatinine 4.25 - 1.00 mg/dL 9.56 3.87 5.64  BUN/Creat Ratio 12 - 28 - - -  Sodium 135 - 145 mmol/L 139 135 142  Potassium 3.5 - 5.1 mmol/L 3.8 3.6 4.1  Chloride 98 - 111 mmol/L 109 103 109  CO2 22 - 32 mmol/L 24 27 23   Calcium 8.9 - 10.3 mg/dL 9.0 3.3(I) 9.0   CXR 9/51/8841 No evidence of acute cardiopulmonary disease.  Discharge Instructions: Discharge Instructions    Diet - low sodium heart healthy   Complete by:  As directed    Discharge instructions   Complete by:  As directed    Thank you for allowing [name] taking care of you at [facility]. Please let us know if you have any question or concern. You can call us at [phone].   Increase activity slowly   Complete by:  As directed       Signed: Chevis Pretty, MD 02/13/2019, 11:51 AM   [phone]   Internal Medicine Attending Note:  I saw and examined the patient on the day of discharge. I reviewed and agree with the discharge summary written by the house staff.  Jessy Oto, M.D., Ph.D.

## 2019-02-08 NOTE — Consult Note (Signed)
Consultation Note Date: 02/08/2019   Patient Name: Nicole Baxter  DOB: [birth year]  MRN: [id number]  Age / Sex: 78 y.o., female  PCP: [name], MD Referring Physician: Tyson Alias, *  Reason for Consultation: Establishing goals of care and Psychosocial/spiritual support  HPI/Patient Profile: 78 y.o. female  with past medical history of Parkinson's disease now with associated dementia, hypertension, COPD, admitted on 02/06/2019 with altered mental status, refusing to move or eat, increased extremity stiffness.  Family has been describing a progressive decline over the past 3 months coalescing into complete inability to move or eat.  Chest x-ray was within normal limits, electrolytes stable she was mildly dehydrated and treated with IV fluids; questionable urinary tract infection.  CTh was negative for acute processes  Consult ordered for goals of care.   Clinical Assessment and Goals of Care: Patient seen, chart reviewed.  Daughter, Nicole Baxter, is at the bedside.  Patient is alert and asks me "are you here to do surgery on me".  Patient is a little more alert today, and is eaten about a quarter of her breakfast.  Ms. Arville Care shares that she and her brother have been trying to take care of her alternating between homes but it is become too difficult , moving back and forth between households, and has actually worsened patient's confusion.  Patient's husband also is ill and status post kidney transplant.  Introduced palliative medicine services as an Engineer, site and source of support.  We also discussed Parkinson's disease, pathophysiology specifically the dementia that often is associated with advanced illness  Daughter is acknowledging that they can no longer care for her at home and is inquiring about skilled nursing facility with short-term rehab then with subsequent transfer to hospice  services in the facility. Consult placed to social work to assist family with skilled nursing placement for rehab.  Informed patient how she can self refer to hospice when she has utilized her skilled days.  We also filled out a MOST form for her to review with her siblings and to sign as her healthcare agent going forward.  Those would consist of DNR no artificial feeding  Patient can no longer speak for herself.  Her healthcare proxy, would be her daughter, Nicole Baxter at [phone]    SUMMARY OF RECOMMENDATIONS   DNR/DNI No artificial feeding Discharge to SNF for skilled days.  Family educated on how to transition to hospice once patient has exhausted skilled days MOST form completed with daughter.  She has an unsigned version (by  Herself) to review with her family and has been educated on how to use it once it is signed Palliative medicine services in the community would be an asset for patient while she is going to skilled days.  Those can be accessed through either [facility] at [phone] or [facility] [place] palliative medicine division at [phone] Code Status/Advance Care Planning:  DNR   Palliative Prophylaxis:   Aspiration, Bowel Regimen, Delirium Protocol, Eye Care, Frequent Pain Assessment, Oral  Care and Turn Reposition  Additional Recommendations (Limitations, Scope, Preferences):  Avoid Hospitalization, Minimize Medications, Initiate Comfort Feeding, No Artificial Feeding, No Chemotherapy, No Hemodialysis, No Radiation, No Surgical Procedures and No Tracheostomy  Psycho-social/Spiritual:   Desire for further Chaplaincy support:no  Additional Recommendations: Referral to Community Resources   Prognosis:   < 6 months in the setting of advanced Parkinson's disease with associated dementia; functional decline x3 months as exhibited by loss of appetite, weight loss as well as loss of mobility  Discharge Planning: Skilled Nursing  Facility for rehab with Palliative care service follow-up      Primary Diagnoses: Present on Admission: . Dehydration . Parkinsonism (HCC)   I have reviewed the medical record, interviewed the patient and family, and examined the patient. The following aspects are pertinent.  Past Medical History:  Diagnosis Date  . Arthritis   . Complication of anesthesia    INCREASED CONFUSION  . Dehydration 01/2019  . Dementia (HCC)   . GERD (gastroesophageal reflux disease)    pt. states she no longer has GERD (09/11/15)  . Hyperglycemia    isolated FBS 122, 95, 104 (Random 138)  . Hyperlipidemia    on pravastatin  . Hypertension    controlled on 2 agents  . 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 Obesity   . Parkinson's disease (tremor, stiffness, slow motion, unstable posture) (HCC)   . Shortness of breath dyspnea    pt. states that she no longer has SOB )09/11/15  . Tremor    Social History   Socioeconomic History  . Marital status: Married    Spouse name: Not on file  . Number of children: 5  . Years of education: 49  . Highest education level: Not on file  Occupational History  . Not on file  Social Needs  . Financial resource strain: Not on file  . Food insecurity:    Worry: Not on file    Inability: Not on file  . Transportation needs:    Medical: Not on file    Non-medical: Not on file  Tobacco Use  . Smoking status: Never Smoker  . Smokeless tobacco: Never Used  Substance and Sexual Activity  . Alcohol use: No    Alcohol/week: 0.0 standard drinks  . Drug use: No  . Sexual activity: Not Currently  Lifestyle  . Physical activity:    Days per week: Not on file    Minutes per session: Not on file  . Stress: Not on file  Relationships  . Social connections:    Talks on phone: Not on file    Gets together: Not on file    Attends religious service: Not on file    Active member of club or organization: Not on file    Attends meetings of clubs  or organizations: Not on file    Relationship status: Not on file  Other Topics Concern  . Not on file  Social History Narrative   Worked at [employer] 31 years. Married to [place] .Active, out and about q day.   5 children, 1 with sarcoidosis, 1 with COPD.   Caffeine use: Drinks a 6 pack of soda per week   Right-handed   Family History  Problem Relation Age of Onset  . Heart disease Mother 2       died of MI @ 42  . Alcohol abuse Father   . Cancer Brother   . Colon cancer Neg Hx    Scheduled  Meds: . carbidopa-levodopa  2 tablet Oral TID  . enoxaparin (LOVENOX) injection  30 mg Subcutaneous Q24H  . feeding supplement (ENSURE ENLIVE)  237 mL Oral BID BM  . rivastigmine  6 mg Oral BID   Continuous Infusions: PRN Meds:.acetaminophen **OR** acetaminophen, ondansetron **OR** ondansetron (ZOFRAN) IV Medications Prior to Admission:  Prior to Admission medications   Medication Sig Start Date End Date Taking? Authorizing Provider  albuterol (PROVENTIL HFA;VENTOLIN HFA) 108 (90 Base) MCG/ACT inhaler Inhale 2 puffs into the lungs every 6 (six) hours as needed for wheezing or shortness of breath. 10/31/17  Yes Nedrud, Jeanella Flattery, MD  carbidopa-levodopa (PARCOPA) 25-100 MG disintegrating tablet Take 2 tablets by mouth 3 (three) times daily. 01/08/19  Yes Lomax, Amy, NP  cyanocobalamin ([employer] VITAMIN B12) 1000 MCG tablet Take 1 tablet (1,000 mcg total) by mouth daily. 02/08/17  Yes [name], MD  rivastigmine (EXELON) 6 MG capsule Take 1 capsule (6 mg total) by mouth 2 (two) times daily. 06/18/18  Yes Anson Fret, MD  ondansetron (ZOFRAN) 4 MG tablet Take 1 tablet (4 mg total) by mouth every 8 (eight) hours as needed for nausea or vomiting. Patient not taking: Reported on 02/06/2019 01/07/19   Terrilee Files, MD   No Known Allergies Review of Systems  Unable to perform ROS: Other    Physical Exam Vitals signs and nursing note reviewed.  Constitutional:      Appearance: She is  ill-appearing.     Comments: Frail, elderly female.  She is confused  HENT:     Head: Normocephalic and atraumatic.  Neck:     Musculoskeletal: Normal range of motion.  Cardiovascular:     Rate and Rhythm: Normal rate.  Pulmonary:     Effort: Pulmonary effort is normal.  Abdominal:     General: Abdomen is flat.  Neurological:     Comments: He is alert, oriented to herself.  Does not recognize that she is in the hospital or why she would be in the hospital Does recognize her daughter at the bedside  Psychiatric:     Comments: Patient is experiencing auditory as well as visual hallucinations There is no overt agitation     Vital Signs: BP (!) 157/83 (BP Location: Left Arm)   Pulse (!) 57   Temp 98.6 F (37 C) (Oral)   Resp 16   Ht 5\' 4"  (1.626 m)   Wt 49.8 kg   SpO2 99%   BMI 18.85 kg/m  Pain Scale: PAINAD   Pain Score: 0-No pain   SpO2: SpO2: 99 % O2 Device:SpO2: 99 % O2 Flow Rate: .   IO: Intake/output summary:   Intake/Output Summary (Last 24 hours) at 02/08/2019 1050 Last data filed at 02/08/2019 0444 Gross per 24 hour  Intake 915 ml  Output 800 ml  Net 115 ml    LBM: Last BM Date: 02/06/19 Baseline Weight: Weight: 49.8 kg Most recent weight: Weight: 49.8 kg     Palliative Assessment/Data:     Time In: 1015 Time Out: 1125 Time Total: 70 min Greater than 50%  of this time was spent counseling and coordinating care related to the above assessment and plan.  Signed by: Irean Hong, NP   Please contact Palliative Medicine Team phone at [phone] for questions and concerns.  For individual provider: See Loretha Stapler

## 2019-02-08 NOTE — Progress Notes (Signed)
Initial Nutrition Assessment  DOCUMENTATION CODES:   Not applicable  INTERVENTION:   -MVI with minerals daily -Increase Ensure Enlive po TID, each supplement provides 350 kcal and 20 grams of protein -Magic Cup TID with meals, each supplement provides 290 kcals and 9 grams protein  NUTRITION DIAGNOSIS:   Inadequate oral intake related to lethargy/confusion, decreased appetite as evidenced by meal completion < 50%, per patient/family report.  GOAL:   Patient will meet greater than or equal to 90% of their needs  MONITOR:   PO intake, Supplement acceptance, Labs, Weight trends, Skin, I & O's  REASON FOR ASSESSMENT:   Malnutrition Screening Tool    ASSESSMENT:   78 y.o. female p/w 2-3 days of decreased PO intake, aphasia, difficulty ambulating.   Pt admitted with dehydration.   Reviewed I/O's: +115 ml x 24 hours and +1.8 L since admission  Spoke with pt daughter and husband at bedside. Pt daughter reports pt has experienced a general decline in health over the past 3 months. She reports intake is often inconsistent; there are times that pt eats well, but then there are spans of days in which pt refuses to eat. Most recently pt had gone 5 days without eating PTA secondary to refusal of food. Pt is a selective eater and often eats only sweets and candy. Pt typically takes Ensure well.   Per daughter, pt consumed most of her breakfast, however, refused lunch and afternoon dose of Ensure.   Per wt hx, pt has experienced a 4.7% wt loss over the past 6 months, which while not significant is concerning given variable intake.   Pt was agitated at time of visit, so nutrition-focused physical exam was deferred at this time due to family request.   Discussed a liberalized diet for pt to help promote oral intake (agree with regular diet). Daughter amenable to Ensure and Magic Cups.   Per palliative care notes, plan to transition to SNF with hospice services.   Labs reviewed.    NUTRITION - FOCUSED PHYSICAL EXAM:    Most Recent Value  Orbital Region  Unable to assess  Upper Arm Region  Unable to assess  Thoracic and Lumbar Region  Unable to assess  Buccal Region  Unable to assess  Temple Region  Unable to assess  Clavicle Bone Region  Unable to assess  Clavicle and Acromion Bone Region  Unable to assess  Scapular Bone Region  Unable to assess  Dorsal Hand  Unable to assess  Patellar Region  Unable to assess  Anterior Thigh Region  Unable to assess  Posterior Calf Region  Unable to assess  Edema (RD Assessment)  Unable to assess  Hair  Unable to assess  Eyes  Unable to assess  Mouth  Unable to assess  Skin  Unable to assess  Nails  Unable to assess       Diet Order:   Diet Order            Diet regular Room service appropriate? Yes; Fluid consistency: Thin  Diet effective now              EDUCATION NEEDS:   Education needs have been addressed  Skin:  Skin Assessment: Reviewed RN Assessment  Last BM:  02/06/19  Height:   Ht Readings from Last 1 Encounters:  02/07/19 5\' 4"  (1.626 m)    Weight:   Wt Readings from Last 1 Encounters:  02/07/19 49.8 kg    Ideal Body Weight:  54.5 kg  BMI:  Body mass index is 18.85 kg/m.  Estimated Nutritional Needs:   Kcal:  1400-1600  Protein:  60-75 grams  Fluid:  > 1.4 L    Torri Michalski A. Mayford Knife, RD, LDN, CDCES Registered Dietitian II Certified Diabetes Care and Education Specialist Pager: [phone] After hours Pager: [phone]

## 2019-02-08 NOTE — Progress Notes (Addendum)
Internal Medicine Teaching Service Attending:   I saw and examined the patient. I reviewed the resident's note and I agree with the resident's findings and plan as documented in the resident's note.  Principal Problem:   Parkinsonism (HCC) Active Problems:   Dementia due to Parkinson's disease without behavioral disturbance (HCC)   Dehydration   Goals of care, counseling/discussion   Palliative care by specialist  78 year old person living with advanced Parkinson's disease admitted with worsening functional status and food intolerance.  Our acute evaluation has been negative, no infection found, no significant metabolic derangements for centrally acting medications.  We treated her supportively with IV fluids and she made good improvements yesterday.  This morning she is now sitting up, awake, not oriented, eating small amounts of breakfast and taking in some oral fluids.  Consulted with palliative care medicine and greatly appreciate their assistance.  Given her status today she does not seem to be appropriate for inpatient hospice at this time.  Instead our plan will be to work on transfer to skilled nursing facility for management of her advanced Parkinson's disease.  And as her condition progresses with the plan to transition to hospice as an outpatient.  PT and OT evaluations are completed.  Medically stable for transfer when skilled nursing facility placement available/approved.  Dr. Cyndie Chime will be attending over the weekend.  Erlinda Hong, MD FACP

## 2019-02-08 NOTE — Care Management Important Message (Signed)
Important Message  Patient Details  Name: Nicole Baxter MRN: [id number] Date of Birth: 01-19-1941   Medicare Important Message Given:  Yes    Adalid Beckmann Stefan Church 02/08/2019, 4:07 PM

## 2019-02-08 NOTE — Progress Notes (Signed)
   Subjective: Patient was seen and evaluated at bedside on morning rounds. No acute events overnight. She is doing much better and can talk with [name]. She started to eat breakfast. No complaints.   Objective:  Vital signs in last 24 hours: Vitals:   02/07/19 0637 02/07/19 1409 02/07/19 2136 02/08/19 0440  BP: 138/86 (!) 123/52 130/89 (!) 157/83  Pulse: 78 92 70 (!) 57  Resp: 16 18 18 16   Temp: 98.5 F (36.9 C) 98.3 F (36.8 C) 98.5 F (36.9 C) 98.6 F (37 C)  TempSrc: Oral Oral Oral Oral  SpO2: 98% 100% 98% 99%  Weight:      Height:       Constitutional: NAD HENT: EOM nl Pulmonary/Chest: Breathing comfortable on RA, normal effort  Extremities: Warm and well perfused. No edema.  Psychiatric: Normal mood and affect Neurology: She is alert and oriented x 1-2  Assessment/Plan:  Principal Problem:   Parkinsonism (HCC) Active Problems:   Dehydration   End of life care  Patient is a 78 yo F with advanced parkinson's dementia presenting with decreased PO intake, aphasia, and difficulty ambulating.   End-Stage Parkinson's:  Her general condition significantly improved today. She is alert and oriented at her baseline. Knows her daughter and when I asked her if she know where she is at now, She says "I am in doctor's office". She started to eat some breakfast and drinks sip of water.  Palliative care saw the patient. Discussed goal of care with daughter and the decision confirmed for DNR/DNI, No artificial feeding, SNF after discharge. Medically stable and improved today.   -Continue Carbidopa-Levodpa and rivastigmine -Encourage for PO intake -Child psychotherapist for placement discharge to SNF for skilled days.  - PT OT eval and treat   FEN: No IV fluid, replete lytes prn, regular diet  VTE ppx: Lovenox  Code Status: DNR  Dispo: Anticipated discharge after SNF placement  Chevis Pretty, MD 02/08/2019, 6:21 AM Pager: [phone]

## 2019-02-09 MED ORDER — LORAZEPAM 2 MG/ML IJ SOLN
0.5000 mg | Freq: Once | INTRAMUSCULAR | Status: AC
  Administered 2019-02-09: 0.5 mg via INTRAVENOUS
  Filled 2019-02-09: qty 1

## 2019-02-09 NOTE — Progress Notes (Signed)
Pt noted increasingly agitated this evening, several attempts to get OOB and wanting to go home. MD on call notified and received to give Ativan 0.5mg  IV x1.

## 2019-02-09 NOTE — Plan of Care (Signed)
  Problem: Skin Integrity: Goal: Risk for impaired skin integrity will decrease Outcome: Progressing   

## 2019-02-09 NOTE — Progress Notes (Signed)
We saw and evaluated patient after being paged about patient has been agitated and combative.  She said that she wants to leave the room when we saw her and was resisting and combative against getting help. Gave 0.5 mg Ativan. It can be repeated if having further combativeness. (Avoiding Haldol or other contraindicated medication for PD)  Elham Nimra Puccinelli, IM PGY-1

## 2019-02-09 NOTE — TOC Initial Note (Addendum)
Transition of Care [facility]) - Initial/Assessment Note    Patient Details  Name: Nicole Baxter MRN: [id number] Date of Birth: 28-May-1941  Transition [facility] CM/SW Contact:    Doy Hutching, LCSWA Phone Number: 02/09/2019, 8:50 AM  Clinical Narrative:                 CSW spoke with pt daughter Aggie Cosier on phone. Introduced self, role, and reason for visit. Pt daughter states that pt has been cared for by her and her siblings over the past few months. They feel SNF with palliative services is the best course of care at this time. Rehab recommended by therapy staff as well. Pt does NOT have coverage through Medicaid or a supplemental plan.  Pt daughter requests [place] SNFs, [place] or [facility]. Also will initiate insurance approval through [employer].  Expected Discharge Plan: Skilled Nursing Facility Barriers to Discharge: English as a second language teacher, Continued Medical Work up; PASRR pending   Patient Goals and CMS Choice Patient states their goals for this hospitalization and ongoing recovery are:: for pt to have more assistance than children can provide   Choice offered to / list presented to : Adult Children  Expected Discharge Plan and Services Expected Discharge Plan: Skilled Nursing Facility   Post Acute Care Choice: Skilled Nursing Facility Living arrangements for the past 2 months: Single Family Home                  Prior Living Arrangements/Services Living arrangements for the past 2 months: Single Family Home Lives with:: Adult Children Patient language and need for interpreter reviewed:: No Do you feel safe going back to the place where you live?: No   pt children do not feel they can provide level of care at this time pt needs  Need for Family Participation in Patient Care: Yes (Comment)(pt requires 24/7 assistance and help with decision making) Care giver support system in place?: Yes (comment)(children and spouse) Current home services:  DME Criminal Activity/Legal Involvement Pertinent to Current Situation/Hospitalization: No - Comment as needed  Activities of Daily Living Home Assistive Devices/Equipment: Walker (specify type) ADL Screening (condition at time of admission) Patient's cognitive ability adequate to safely complete daily activities?: No Is the patient deaf or have difficulty hearing?: No Does the patient have difficulty seeing, even when wearing glasses/contacts?: No Does the patient have difficulty concentrating, remembering, or making decisions?: Yes Patient able to express need for assistance with ADLs?: Yes Does the patient have difficulty dressing or bathing?: Yes Independently performs ADLs?: No Communication: Needs assistance Is this a change from baseline?: Pre-admission baseline Dressing (OT): Needs assistance Is this a change from baseline?: Pre-admission baseline Grooming: Needs assistance Is this a change from baseline?: Pre-admission baseline Feeding: Needs assistance Is this a change from baseline?: Pre-admission baseline Bathing: Needs assistance Is this a change from baseline?: Pre-admission baseline Toileting: Needs assistance Is this a change from baseline?: Pre-admission baseline In/Out Bed: Needs assistance Is this a change from baseline?: Pre-admission baseline Walks in Home: Needs assistance Is this a change from baseline?: Pre-admission baseline Does the patient have difficulty walking or climbing stairs?: Yes Weakness of Legs: Both Weakness of Arms/Hands: None  Permission Sought/Granted Permission sought to share information with : Family Supports Permission granted to share information with : No(pt with fluctuating orientation)  Share Information with NAME: Chilton Si  Permission granted to share info w AGENCY: SNFs/[employer]  Permission granted to share info w Relationship: daughter  Permission granted to share  info w Contact Information:  [phone]  Emotional Assessment Appearance:: Appears stated age Attitude/Demeanor/Rapport: Unable to Assess   Orientation: : Oriented to Self Alcohol / Substance Use: Not Applicable Psych Involvement: No (comment)  Admission diagnosis:  Dehydration [E86.0] Acute cystitis without hematuria [N30.00] Dementia due to Parkinson's disease without behavioral disturbance (HCC) [G20, F02.80] Altered mental status, unspecified altered mental status type [R41.82] Patient Active Problem List   Diagnosis Date Noted  . Palliative care by specialist   . Goals of care, counseling/discussion 02/07/2019  . Dehydration 02/06/2019  . Hypotension 07/16/2018  . Fatigue 06/05/2018  . Mild intermittent asthma, uncomplicated 10/31/2017  . Weight loss, unintentional 07/11/2017  . Chronic bilateral low back pain without sciatica 12/19/2016  . Dementia due to Parkinson's disease without behavioral disturbance (HCC) 10/08/2016  . Hypertensive retinopathy of both eyes, grade 2 01/15/2016  . 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 History of colon polyps 12/25/2014  . Health care maintenance 02/13/2014  . Subclinical hypothyroidism 12/25/2013  . Right renal mass 06/11/2012  . Osteoarthritis of both knees 03/15/2012  . Parkinsonism (HCC) 07/13/2010  . ALLERGIC RHINITIS, SEASONAL 02/20/2007  . Hyperlipidemia 09/27/2006  . Essential hypertension 09/27/2006  . GERD 09/27/2006   PCP:  [name], MD Pharmacy:   [facility] [address] [place] - [address] [place] [address] [place] [address] Phone: [phone] Fax: [phone]     Social Determinants of Health (SDOH) Interventions    Readmission Risk Interventions 30 Day Unplanned Readmission Risk Score     ED to Hosp-Admission (Current) from 02/06/2019 in [facility]  SURGICAL  30 Day Unplanned Readmission Risk Score (%)  11 Filed at 02/09/2019 0800     This score is  the patient's risk of an unplanned readmission within 30 days of being discharged (0 -100%). The score is based on dignosis, age, lab data, medications, orders, and past utilization.   Low:  0-14.9   Medium: 15-21.9   High: 22-29.9   Extreme: 30 and above       No flowsheet data found.

## 2019-02-09 NOTE — NC FL2 (Signed)
[place] MEDICAID FL2 LEVEL OF CARE SCREENING TOOL     IDENTIFICATION  Patient Name: Nicole Baxter Birthdate: 02-22-1941 Sex: female Admission Date (Current Location): 02/06/2019  [facility] and [place] Number:  [employer] and Address:  [facility], [address] [place], [place] [address]      Provider Number: [id number]  Attending Physician Name and Address:  Tyson Alias, *  Relative Name and Phone Number:  Desiree Hane; daughter; [phone]    Current Level of Care: Hospital Recommended Level of Care: Skilled Nursing Facility Prior Approval Number:    Date Approved/Denied:   PASRR Number: pending  Discharge Plan: SNF    Current Diagnoses: Patient Active Problem List   Diagnosis Date Noted  . Palliative care by specialist   . Goals of care, counseling/discussion 02/07/2019  . Dehydration 02/06/2019  . Hypotension 07/16/2018  . Fatigue 06/05/2018  . Mild intermittent asthma, uncomplicated 10/31/2017  . Weight loss, unintentional 07/11/2017  . Chronic bilateral low back pain without sciatica 12/19/2016  . Dementia due to Parkinson's disease without behavioral disturbance (HCC) 10/08/2016  . Hypertensive retinopathy of both eyes, grade 2 01/15/2016  . 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 History of colon polyps 12/25/2014  . Health care maintenance 02/13/2014  . Subclinical hypothyroidism 12/25/2013  . Right renal mass 06/11/2012  . Osteoarthritis of both knees 03/15/2012  . Parkinsonism (HCC) 07/13/2010  . ALLERGIC RHINITIS, SEASONAL 02/20/2007  . Hyperlipidemia 09/27/2006  . Essential hypertension 09/27/2006  . GERD 09/27/2006    Orientation RESPIRATION BLADDER Height & Weight     Self  Normal Incontinent, External catheter Weight: 109 lb 12.8 oz (49.8 kg) Height:  5\' 4"  (162.6 cm)  BEHAVIORAL SYMPTOMS/MOOD NEUROLOGICAL BOWEL NUTRITION STATUS      Continent Diet(see discharge  summary)  AMBULATORY STATUS COMMUNICATION OF NEEDS Skin   Extensive Assist Verbally Normal                       Personal Care Assistance Level of Assistance  Bathing, Feeding, Dressing Bathing Assistance: Maximum assistance Feeding assistance: Limited assistance Dressing Assistance: Maximum assistance     Functional Limitations Info  Sight, Hearing, Speech Sight Info: Adequate Hearing Info: Adequate Speech Info: Impaired(slurred)    SPECIAL CARE FACTORS FREQUENCY  OT (By licensed OT), PT (By licensed PT)     PT Frequency: 5x week OT Frequency: 5x week            Contractures Contractures Info: Not present    Additional Factors Info  Code Status, Allergies, Psychotropic Code Status Info: DNR Allergies Info: No Known Allergies Psychotropic Info: rivastigmine (EXELON) capsule 6 mg 2x daily PO; carbidopa-levodopa (SINEMET IR) 25-100 MG per tablet immediate release 2 tablet -take pill crushed with applesauce or appropriate alternative         Current Medications (02/09/2019):  This is the current hospital active medication list Current Facility-Administered Medications  Medication Dose Route Frequency Provider Last Rate Last Dose  . acetaminophen (TYLENOL) tablet 650 mg  650 mg Oral Q6H PRN Levora Dredge, MD       Or  . acetaminophen (TYLENOL) suppository 650 mg  650 mg Rectal Q6H PRN Levora Dredge, MD      . carbidopa-levodopa (SINEMET IR) 25-100 MG per tablet immediate release 2 tablet  2 tablet Oral TID Reymundo Poll, MD   2 tablet at 02/08/19 2300  . enoxaparin (LOVENOX) injection 30 mg  30 mg Subcutaneous Q24H  Tyson Alias, MD   30 mg at 02/08/19 2200  . feeding supplement (ENSURE ENLIVE) (ENSURE ENLIVE) liquid 237 mL  237 mL Oral TID BM Tyson Alias, MD   237 mL at 02/08/19 2158  . multivitamin with minerals tablet 1 tablet  1 tablet Oral Daily Tyson Alias, MD      . ondansetron [facility]) tablet 4 mg  4 mg Oral Q6H PRN  Levora Dredge, MD       Or  . ondansetron (ZOFRAN) injection 4 mg  4 mg Intravenous Q6H PRN Helberg, Justin, MD      . rivastigmine (EXELON) capsule 6 mg  6 mg Oral BID Levora Dredge, MD   6 mg at 02/08/19 2158     Discharge Medications: Please see discharge summary for a list of discharge medications.  Relevant Imaging Results:  Relevant Lab Results:   Additional Information [phone] [id number]; pt family requests palliative services follow pt at [facility], LCSWA

## 2019-02-09 NOTE — Progress Notes (Signed)
`    Subjective: Patient was seen and evaluated at bedside this morning. No acute events overnight. No more combativeness reported. She is sleeping and appears comfortable. No family member in the room.  Objective:  Vital signs in last 24 hours: Vitals:   02/07/19 2136 02/08/19 0440 02/08/19 2019 02/09/19 0504  BP: 130/89 (!) 157/83 (!) 146/117 (!) 140/92  Pulse: 70 (!) 57 70 75  Resp: 18 16 17 17   Temp: 98.5 F (36.9 C) 98.6 F (37 C) 97.7 F (36.5 C) 97.8 F (36.6 C)  TempSrc: Oral Oral Oral Oral  SpO2: 98% 99% 97% 97%  Weight:      Height:       Vital signs reviewed. Nursing notes reviewed.  General: Sleeping in the bed, in no acute distress CV: RRR, no murmur Pulmonary/Chest:Breathing comfortable in room air air. Has normal effort. No crackle with anterior and lat chest exam. Extremities: Warm and well perfused.No edema.   Assessment/Plan:  Principal Problem:   Parkinsonism (HCC) Active Problems:   Dementia due to Parkinson's disease without behavioral disturbance (HCC)   Dehydration   Goals of care, counseling/discussion   Palliative care by specialist  Patient is a 78 yo F with advanced parkinson's dementia presenting with decreased PO intake, aphasia, and difficulty ambulating.   End-Stage Parkinson's: General condition improved yesterday compair8ing to arrival. She started to eat yesterday and was verbal. Had some agitation/combativeness yesterday afternoon and was trying to walk out of the room without assistance. It controled with 0.5 mg IV Ativan. She is currently sleeping and calm.  -Goal of care: SNF and may transfer to hospice later outpatient -Continue Carbidopa-Levodpa and rivastigmine -Encourage for PO intake -Child psychotherapist for SNF placement  for skilled days.  - PT OT eval and treat -Delirium precausion  Dispo: DC when SNF placement  Chevis Pretty, MD 02/09/2019, 6:16 AM Pager: [phone]

## 2019-02-09 NOTE — Progress Notes (Signed)
Medicine attending: Clinical status and medical database reviewed with resident physician Dr Teola Bradley and I concur with her evaluation and management plan. 78 year old woman with advanced Parkinson's disease with associated dementia admitted on March 11 with dehydration when she was refusing to eat drink or take her medications.  She was given gentle IV hydration.  Parkinsonian medications continued.  Low doses of lorazepam given for sedation.  She continues to have intermittent agitation. She is stable to return to assisted living facility when bed available.  Hospice care under consideration.

## 2019-02-10 DIAGNOSIS — N3000 Acute cystitis without hematuria: Secondary | ICD-10-CM | POA: Diagnosis present

## 2019-02-10 LAB — BASIC METABOLIC PANEL
Anion gap: 5 (ref 5–15)
BUN: 8 mg/dL (ref 8–23)
CO2: 27 mmol/L (ref 22–32)
Calcium: 8.6 mg/dL — ABNORMAL LOW (ref 8.9–10.3)
Chloride: 103 mmol/L (ref 98–111)
Creatinine, Ser: 0.76 mg/dL (ref 0.44–1.00)
GFR calc Af Amer: >60 mL/min (ref >60)
GFR calc non Af Amer: >60 mL/min (ref >60)
Glucose, Bld: 82 mg/dL (ref 70–99)
Potassium: 3.6 mmol/L (ref 3.5–5.1)
Sodium: 135 mmol/L (ref 135–145)

## 2019-02-10 MED ORDER — WHITE PETROLATUM EX OINT
TOPICAL_OINTMENT | CUTANEOUS | Status: AC
  Filled 2019-02-10: qty 28.35

## 2019-02-10 MED ORDER — LORAZEPAM 2 MG/ML IJ SOLN
0.5000 mg | Freq: Once | INTRAMUSCULAR | Status: AC
  Administered 2019-02-11: 0.5 mg via INTRAVENOUS
  Filled 2019-02-10: qty 1

## 2019-02-10 MED ORDER — ENOXAPARIN SODIUM 40 MG/0.4ML ~~LOC~~ SOLN
40.0000 mg | SUBCUTANEOUS | Status: DC
  Administered 2019-02-11 – 2019-02-12 (×2): 40 mg via SUBCUTANEOUS
  Filled 2019-02-10 (×3): qty 0.4

## 2019-02-10 MED ORDER — WHITE PETROLATUM EX OINT
TOPICAL_OINTMENT | CUTANEOUS | Status: AC
  Administered 2019-02-10: 0.2
  Filled 2019-02-10: qty 28.35

## 2019-02-10 NOTE — Progress Notes (Signed)
Pt was restless trying to get out of bed, family left around 1830, I paged on call MD waiting for PRN order, endorsed to night shift.

## 2019-02-10 NOTE — Progress Notes (Signed)
Pt restless unsteady changed regular bed to low bed, with floor mat and telesitter , she is sitting on the chair with chair alarm on, call light within reach.

## 2019-02-10 NOTE — Progress Notes (Signed)
   Subjective: Patient was seen and evaluated at bedside on morning rounds. No family member is in the room currently. Nicole Baxter is lying in the bed and is tearful. When asks her what does she need, she mentions "children". She is cooperative for exam and can say her name. She stopped crying  After a while but still looked sad.   Objective:  Vital signs in last 24 hours: Vitals:   02/09/19 0504 02/09/19 1450 02/09/19 2042 02/10/19 0631  BP: (!) 140/92 111/74 138/86 (!) 143/84  Pulse: 75 73 66 74  Resp: 17 19 14 16   Temp: 97.8 F (36.6 C) 98.3 F (36.8 C) 98.2 F (36.8 C) (!) 97.4 F (36.3 C)  TempSrc: Oral Oral Oral Axillary  SpO2: 97% 97% 99% 99%  Weight:      Height:       Physical Exam HENT:     Head: Normocephalic and atraumatic.  Cardiovascular:     Rate and Rhythm: Normal rate and regular rhythm.     Pulses: Normal pulses.     Heart sounds: Normal heart sounds. No murmur.  Pulmonary:     Effort: Pulmonary effort is normal.     Breath sounds: Normal breath sounds. No wheezing or rales.  Neurological:     Mental Status: She is alert. Mental status is at baseline.     Assessment/Plan:  Principal Problem:   Parkinsonism (HCC) Active Problems:   Dementia due to Parkinson's disease without behavioral disturbance (HCC)   Dehydration   Goals of care, counseling/discussion   Palliative care by specialist  Patient is a 78 yo F with advanced parkinson's dementia presenting with decreased PO intake, aphasia, and difficulty ambulating.   End-Stage Parkinson's: General condition improved after IV hydration.  But had another episode of agitation last night that controlled with 0.5 mg IV Ativan. She is currently lying back in the bed and is tearful. She is able to tell her name and is cooperative for the exam and follows the command. We will continue Parkinson disease medications and awaiting for SNF placement.  -Goal of care: SNF and may switch to outpatient hospice  later if family decides -Continue Carbidopa-Levodpa andrivastigmine -Encourage for PO intake -Child psychotherapist for SNF placement for skilled days. -PT/OT eval and treat -Delirium precausion -Tele sitter  Dispo: Discharge pending untill SNF placement   Chevis Pretty, MD 02/10/2019, 11:40 AM Pager: [phone]

## 2019-02-11 MED ORDER — SODIUM CHLORIDE 0.9 % IV SOLN
INTRAVENOUS | Status: AC
  Administered 2019-02-11: 11:00:00 via INTRAVENOUS

## 2019-02-11 NOTE — Progress Notes (Signed)
   Subjective: Patient was seen and evaluated this morning.  She is lying in the bed in no acute distress but gets tearful in a minute and mentions that she needs her children.  We told her that her son is in the room.  She got calm as soon as her son came to the bedside and announced his presence. She denies any pain.  Objective:  Vital signs in last 24 hours: Vitals:   02/10/19 0631 02/10/19 1334 02/10/19 2114 02/11/19 0521  BP: (!) 143/84 129/75 126/78 124/84  Pulse: 74 67 65 69  Resp: 16  17 18   Temp: (!) 97.4 F (36.3 C) 98 F (36.7 C) 98 F (36.7 C) 98.4 F (36.9 C)  TempSrc: Axillary Oral Oral Oral  SpO2: 99% 99% 97% 99%  Weight:      Height:       Nursing home reviewed, vital signs reviewed General: No acute distress, she is cooperative and follow the commands. CV: RRR, no murmur Lungs: CTA bilaterally, no wheeze Abdomen: Is soft and nontender Extremities: No lower extremity edema, pulses are present Neurologic exam: She is alert and oriented at her baseline  Assessment/Plan:  Principal Problem:   Parkinsonism (HCC) Active Problems:   Dementia due to Parkinson's disease without behavioral disturbance (HCC)   Dehydration   Goals of care, counseling/discussion   Palliative care by specialist   Acute cystitis without hematuria  78 yo F with advanced parkinson's dementia presenting with decreased PO intake, aphasia, and difficulty ambulating.   End-Stage Parkinson's: Patient did not show any improvement comparing to yesterday.  And per family she stopped eating again. SNF placement was not approved by insurance. I had a peer to peer discussion with insurance representative, she mentions that she does not think that patient will have improvement with rehab.  And at the same time, she did not find any recent PT OT evaluation to support patient's needs for rehab.  She thinks that hospice is probably the best choice for patient but she will review the case again.   I  talked to patient's daughter and discussed the result with her.  I told her that I still work on Therapist, occupational for SNF, however, I am not sure if it will happen. She is very reasonable and understands the process. Mentions that she used to work in [employer].  I mentioned that hospice may be a better choice, considering end stage parkinson. She understands the situation, and is open to talk to palliative care unless she prefers SNF.  -PT OT evaluation and treat, appreciate recommendation -Social worker/palliative care consult to discuss hospice with daugther -N/S 125 mL/h -Continue Carbidopa-Levodpa andrivastigmine -Encourage for PO intake -PT/OT eval and treat -Delirium precausion -Tele sitter  Dispo: Discharge pending for placement decision   Chevis Pretty, MD 02/11/2019, 4:31 PM Pager: [phone]

## 2019-02-11 NOTE — Plan of Care (Signed)
  Problem: Education: Goal: Knowledge of General Education information will improve Description: Including pain rating scale, medication(s)/side effects and non-pharmacologic comfort measures Outcome: Not Progressing   Problem: Clinical Measurements: Goal: Ability to maintain clinical measurements within normal limits will improve Outcome: Not Progressing   

## 2019-02-11 NOTE — TOC Progression Note (Addendum)
Transition of Care [facility]) - Progression Note    Patient Details  Name: Nicole Baxter MRN: [id number] Date of Birth: 1941-06-05  Transition of Care [facility]) CM/SW Contact  Doy Hutching, [place] Phone Number: 02/11/2019, 8:40 AM  Clinical Narrative:    8:40am- Pt daughter has selected [facility] as preferred SNF. Clinical denial received; peer to peer has been offered by [employer], pt MD aware.  PASRR received- [id number]  9:43am- Spoke with Aggie Cosier, pt daughter and made her aware of the initial denial/peer to peer being offered. Also spoke with her about telesitter not being offered at [facility]. She states understanding, is working from home today but will be available via telephone for determination. Pt CANNOT d/c without first having 24 hours without telesitter.  1:28pmSherron Monday with MD and PMT RN Melanie. Pt has had change in status, PMT will meet with family tomorrow. If pt is hospice eligible then we will facilitate that transfer. Again, await PMT conference tomorrow to ensure appropriate level of care.      Expected Discharge Plan: Skilled Nursing Facility Barriers to Discharge: English as a second language teacher, Continued Medical Work up  Expected Discharge Plan and Services Expected Discharge Plan: Skilled Nursing Facility   Post Acute Care Choice: Skilled Nursing Facility Living arrangements for the past 2 months: Single Family Home                   Social Determinants of Health (SDOH) Interventions    Readmission Risk Interventions 30 Day Unplanned Readmission Risk Score     ED to Hosp-Admission (Current) from 02/06/2019 [facility] 6 NORTH  SURGICAL  30 Day Unplanned Readmission Risk Score (%)  13 Filed at 02/11/2019 0801     This score is the patient's risk of an unplanned readmission within 30 days of being discharged (0 -100%). The score is based on dignosis, age, lab data, medications, orders, and past utilization.   Low:  0-14.9   Medium:  15-21.9   High: 22-29.9   Extreme: 30 and above       No flowsheet data found.

## 2019-02-11 NOTE — Progress Notes (Signed)
Physical Therapy Treatment Patient Details Name: Nicole Baxter MRN: [id number] DOB: [birth year] Today's Date: 02/11/2019    History of Present Illness 78 yo female with advanced parkinson's disease dementia who presented to ED with 3 days of decreased po intake. She was admitted for treatment of dehydration.    PT Comments    Pt somewhat difficult to rouse but once awake agreeable to ambulate with physical therapy. Pt limited in safe mobility by decreased strength and Parkinsonian gait, including decreased BoS and short step length. Pt requires minA for bed mobility, transfers and ambulation of 30 feet with RW. PT will continue to follow acutely.     Follow Up Recommendations  SNF;Supervision/Assistance - 24 hour     Equipment Recommendations  Other (comment)(defer to next venue)    Recommendations for Other Services       Precautions / Restrictions Precautions Precautions: Fall Restrictions Weight Bearing Restrictions: No Other Position/Activity Restrictions: Pt has extensor tone in trunk, leans posteriorly    Mobility  Bed Mobility Overal bed mobility: Needs Assistance Bed Mobility: Supine to Sit;Sit to Supine     Supine to sit: +2 for physical assistance;Min assist     General bed mobility comments: minAx2 for LE management off bed, and trunk to upright  Transfers Overall transfer level: Needs assistance Equipment used: Rolling walker (2 wheeled) Transfers: Sit to/from Stand Sit to Stand: Min assist         General transfer comment: MinA for power up and steadying with RW, hand over hand placement of hands on RW hand grips  Ambulation/Gait Ambulation/Gait assistance: +2 safety/equipment Gait Distance (Feet): 30 Feet Assistive device: Rolling walker (2 wheeled) Gait Pattern/deviations: Decreased step length - left;Decreased step length - right;Drifts right/left;Narrow base of support;Festinating;Leaning posteriorly Gait velocity: decreased Gait velocity  interpretation: <1.8 ft/sec, indicate of risk for recurrent falls General Gait Details: ModA for balance due to posterior lean and narrow Bos, pt with particular difficulty in turning, constant verbal and tactile cues for increasing step length        Balance Overall balance assessment: Needs assistance Sitting-balance support: Feet supported;Bilateral upper extremity supported Sitting balance-Leahy Scale: Fair Sitting balance - Comments: Pt able to sit EOB without support once positioning in optimal upright position Postural control: Posterior lean Standing balance support: Bilateral upper extremity supported;During functional activity Standing balance-Leahy Scale: Poor Standing balance comment: Pt required bil UE support and modA of PT to maintain upright during ambulation and standing                            Cognition Arousal/Alertness: Lethargic Behavior During Therapy: Flat affect Overall Cognitive Status: History of cognitive impairments - at baseline                                 General Comments: Pt able to state her name, but not oriented otherwise. She has Parkinson's with dementia at baseline and family reports she has been having hallucinations and increased confusion.         General Comments General comments (skin integrity, edema, etc.): Pt daughter present throughout session.      Pertinent Vitals/Pain Pain Assessment: Faces Faces Pain Scale: No hurt           PT Goals (current goals can now be found in the care plan section) Acute Rehab PT Goals Patient Stated Goal: pt did not state; family states  for pt to go to SNF for better help PT Goal Formulation: With family Time For Goal Achievement: 02/21/19 Potential to Achieve Goals: Good    Frequency    Min 2X/week      PT Plan Current plan remains appropriate       AM-PAC PT "6 Clicks" Mobility   Outcome Measure  Help needed turning from your back to your side  while in a flat bed without using bedrails?: A Little Help needed moving from lying on your back to sitting on the side of a flat bed without using bedrails?: A Lot Help needed moving to and from a bed to a chair (including a wheelchair)?: A Lot Help needed standing up from a chair using your arms (e.g., wheelchair or bedside chair)?: A Little Help needed to walk in hospital room?: A Lot Help needed climbing 3-5 steps with a railing? : Total 6 Click Score: 13    End of Session Equipment Utilized During Treatment: Gait belt Activity Tolerance: Patient tolerated treatment well Patient left: in bed;with bed alarm set;with call bell/phone within reach;with family/visitor present Nurse Communication: Mobility status PT Visit Diagnosis: Unsteadiness on feet (R26.81);Other symptoms and signs involving the nervous system (Z00.174)     Time: 9449-6759 PT Time Calculation (min) (ACUTE ONLY): 21 min  Charges:  $Gait Training: 8-22 mins                     Catrina Fellenz B. Beverely Risen PT, DPT Acute Rehabilitation Services Pager [phone] Office [phone]    Elon Alas [facility] 02/11/2019, 5:23 PM

## 2019-02-11 NOTE — Progress Notes (Signed)
  Date: 02/11/2019  Patient name: FINNEGAN LITZINGER  Medical record number: [id number]  Date of birth: Jun 20, 1941   I have seen and evaluated this patient and I have discussed the plan of care with the house staff. Please see their note for complete details. I concur with their findings with the following additions/corrections:   Unfortunately, Mrs. Elley is a little bit worse today, has not shown much interest in eating or drinking.  Speech very difficult to understand, began crying during our conversation and was eventually reassured by her son at the bedside.  Reiterated with the son that given her inconsistent ability to eat or drink, this is likely a terminal situation due to end-stage Parkinson disease and that she would most likely benefit from hospice.  The family had been a little reluctant about this because of her initial improvement after hospitalization.  However, at this point it seems unlikely that she is going to have much meaningful recovery.  I doubt skilled rehab would be able to make much difference for her.  We will try to work with the family and the palliative care team to discuss home hospice and hopefully discharge her tomorrow.  Jessy Oto, M.D., Ph.D. 02/11/2019, 4:52 PM

## 2019-02-12 LAB — BASIC METABOLIC PANEL
Anion gap: 6 (ref 5–15)
BUN: 12 mg/dL (ref 8–23)
CO2: 24 mmol/L (ref 22–32)
Calcium: 9 mg/dL (ref 8.9–10.3)
Chloride: 109 mmol/L (ref 98–111)
Creatinine, Ser: 0.88 mg/dL (ref 0.44–1.00)
GFR calc Af Amer: >60 mL/min (ref >60)
GFR calc non Af Amer: >60 mL/min (ref >60)
Glucose, Bld: 92 mg/dL (ref 70–99)
Potassium: 3.8 mmol/L (ref 3.5–5.1)
Sodium: 139 mmol/L (ref 135–145)

## 2019-02-12 NOTE — Progress Notes (Signed)
  Date: 02/12/2019  Patient name: Nicole Baxter  Medical record number: [id number]  Date of birth: 05/21/1941   I have seen and evaluated this patient and I have discussed the plan of care with the house staff. Please see their note for complete details. I concur with their findings with the following additions/corrections:   Seen and examined with the team at the bedside today, also discussed her case with her daughter, [name], and the palliative care NP.  Although she is more alert and interactive today, she still is drinking very little fluids and has likely only shown a temporary improvement due to IV hydration.  [name] was very clear that she understands that her overall decline will likely continue and that IV hydration is not a long-term solution.  We are all in agreement that hospice is likely the best destination for her.  At this time, she is best suited for inpatient hospice, and we are working on finding a bed for her.  Jessy Oto, M.D., Ph.D. 02/12/2019, 4:38 PM

## 2019-02-12 NOTE — TOC Progression Note (Addendum)
Transition of Care [facility]) - Progression Note    Patient Details  Name: Nicole Baxter MRN: [id number] Date of Birth: 1941-10-19  Transition of Care [facility]) CM/SW Contact  Doy Hutching, [place] Phone Number: 02/12/2019, 12:20 PM  Clinical Narrative:    12:20pm- Aware pt family electing for comfort and without continued iv fluids pt likely will meet hospice requirements. Pt daughter was at bedside, provide choice and explained Hospice referral process. Pt daughter states her family would prefer [facility]) and if they do not have any beds to contact [facility] in regards to [employer].  CSW will make referral to [facility] and then contact [facility] if no beds.  12:25pm- Referral provided to Cassandra with [facility].   1:33pm- Pt has been accepted at [facility], pt daughter aware that pt is on wait list for this bed. She plans to speak with her brother and see if they are open to referral to [facility] as well if there is no [place] bed tomorrow, continuing to follow.   Expected Discharge Plan: Hospice Medical Facility Barriers to Discharge: Hospice Bed not available, Continued Medical Work up  Expected Discharge Plan and Services Expected Discharge Plan: Hospice Medical Facility   Post Acute Care Choice: Skilled Nursing Facility Living arrangements for the past 2 months: Single Family Home                  Social Determinants of Health (SDOH) Interventions    Readmission Risk Interventions 30 Day Unplanned Readmission Risk Score     ED to Hosp-Admission (Current) from 02/06/2019 in [facility] 6 NORTH  SURGICAL  30 Day Unplanned Readmission Risk Score (%)  12 Filed at 02/12/2019 1200     This score is the patient's risk of an unplanned readmission within 30 days of being discharged (0 -100%). The score is based on dignosis, age, lab data, medications, orders, and past utilization.   Low:  0-14.9   Medium:  15-21.9   High: 22-29.9   Extreme: 30 and above       No flowsheet data found.

## 2019-02-12 NOTE — Progress Notes (Signed)
Daily Progress Note   Patient Name: Nicole Baxter       Date: 02/12/2019 DOB: 1941-04-17  Age: 78 y.o. MRN#: [id number] Attending Physician: Anne Shutter, MD Primary Care Physician: [name], MD Admit Date: 02/06/2019  Reason for Consultation/Follow-up: Establishing goals of care  Subjective: Sitting up in chair, ate breakfast, asking to go home  Length of Stay: 6  Current Medications: Scheduled Meds:   carbidopa-levodopa  2 tablet Oral TID   enoxaparin (LOVENOX) injection  40 mg Subcutaneous Q24H   feeding supplement (ENSURE ENLIVE)  237 mL Oral TID BM   multivitamin with minerals  1 tablet Oral Daily   rivastigmine  6 mg Oral BID    Continuous Infusions:   PRN Meds: acetaminophen **OR** acetaminophen, ondansetron **OR** ondansetron (ZOFRAN) IV  Physical Exam Constitutional:      General: She is not in acute distress. HENT:     Head: Normocephalic and atraumatic.  Cardiovascular:     Rate and Rhythm: Normal rate and regular rhythm.  Pulmonary:     Effort: Pulmonary effort is normal. No respiratory distress.     Breath sounds: Normal breath sounds.  Abdominal:     Palpations: Abdomen is soft.  Musculoskeletal:     Right lower leg: No edema.     Left lower leg: No edema.  Skin:    General: Skin is warm and dry.  Neurological:     Mental Status: She is alert. She is disoriented.  Psychiatric:        Cognition and Memory: Cognition is impaired. Memory is impaired.        Judgment: Judgment is impulsive and inappropriate.             Vital Signs: BP (!) 131/97 (BP Location: Left Arm)    Pulse 72    Temp 98.1 F (36.7 C) (Oral)    Resp 16    Ht 5\' 4"  (1.626 m)    Wt 49.8 kg    SpO2 99%    BMI 18.85 kg/m  SpO2: SpO2: 99 % O2 Device: O2 Device: Room Air O2  Flow Rate:    Intake/output summary:   Intake/Output Summary (Last 24 hours) at 02/12/2019 0930 Last data filed at 02/12/2019 0600 Gross per 24 hour  Intake 774.5 ml  Output 600 ml  Net 174.5 ml   LBM: Last BM Date: 02/11/19 Baseline Weight: Weight: 49.8 kg Most recent weight: Weight: 49.8 kg       Palliative Assessment/Data: PPS 40%      Patient Active Problem List   Diagnosis Date Noted   Acute cystitis without hematuria    Palliative care by specialist    Goals of care, counseling/discussion 02/07/2019   Dehydration 02/06/2019   Hypotension 07/16/2018   Fatigue 06/05/2018   Mild intermittent asthma, uncomplicated 10/31/2017   Weight loss, unintentional 07/11/2017   Chronic bilateral low back pain without sciatica 12/19/2016   Dementia due to Parkinson's disease without behavioral disturbance (HCC) 10/08/2016   Hypertensive retinopathy of both eyes, grade 2 01/15/2016   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 female  with past medical history of Parkinson's disease, dementia, hypertension, and COPD admitted on 02/06/2019 with altered mental status, refusing to move or eat, and increased extremity stiffness.  Family has been describing a progressive decline over the past 3 months coalescing into complete inability to move or eat.  Chest x-ray was within normal limits, electrolytes stable she was mildly dehydrated and treated with IV fluids; questionable urinary tract infection.  CT head was negative for acute processes. PMT consult ordered  for goals of care.   Assessment: Follow up with patient's daughter, Nicole Baxter, and Nicole Baxter via telephone.  We discussed that goals remain clear - they understand that patient's disease is progressive and she will continue to decline. They want to focus on comfort and quality of life moving forward.  Discussed with patient;s attending Dr. Sandre Kitty who shares that patient "perks up" with IV fluids - however, after discontinuing them she quickly becomes catatonic, refuses PO intake. Discussed with Nicole Baxter that due to this, patient likely eligible for hospice facility. Nicole Baxter expresses understanding that hospice facility is for when someone is very close to end of life - days-weeks. She understands that d/t patient's refusal of PO intake she would fall into this category.  We also discussed SNF facility - discussed that patient has not been approved for rehab. We discussed hospice care at home - family shares this is not feasible for them as they are unable to provide the amount of care she needs.  Recommendations/Plan:  Plan to d/c IV fluids today - anticipate rapid decline  Social work referral for residential hospice placement  Goals are to elevate comfort and quality of life   Goals of Care and Additional Recommendations:  Limitations on Scope of Treatment: Initiate Comfort Feeding, No Artificial Feeding and No IV Fluids  Code Status:  DNR  Prognosis:   < 2 weeks  Discharge Planning:  Hospice facility  Care plan was discussed with Dr. Sandre Kitty, social work, patient's daughter and Nicole Baxter  Thank you for allowing the Palliative Medicine Team to assist in the care of this patient.   Total Time 40 minutes Prolonged Time Billed  no       Greater than 50%  of this time was spent counseling and coordinating care related to the above assessment and plan.  Gerlean Ren, DNP, [facility] Palliative Medicine Team Team Phone # [phone]  Pager [phone]

## 2019-02-12 NOTE — Progress Notes (Signed)
   Subjective: Patient was seen and evaluated at bedside on morning rounds. No acute events overnight.  She was sitting on the chair initially without any family member at room she wants to go to the other side of the room next to the window.  Her daughter then came in to the room. When asked Ms. Ismaili what we can do for her, she smiles and says " let me go".   She is cooperative with exam and remained at her chair.   Objective:  Vital signs in last 24 hours: Vitals:   02/11/19 0521 02/11/19 1723 02/11/19 2146 02/12/19 0551  BP: 124/84 123/86 119/87 (!) 131/97  Pulse: 69 88 79 72  Resp: 18  16 16   Temp: 98.4 F (36.9 C) (!) 97.5 F (36.4 C) 98 F (36.7 C) 98.1 F (36.7 C)  TempSrc: Oral Oral Oral Oral  SpO2: 99% 100% 98% 99%  Weight:      Height:       Vital signs reviewed, nursing notes reviewed General: Patient is sitting in the chair in no acute distress CV: RRR, normal S1-S2, no murmur Pulmonary exam: CTA bilaterally, no wheeze, no crackle Extremities: No edema Neurologic exam: Alert and oriented at baseline  Assessment/Plan:  Principal Problem:   Parkinsonism (HCC) Active Problems:   Dementia due to Parkinson's disease without behavioral disturbance (HCC)   Dehydration   Goals of care, counseling/discussion   Palliative care by specialist   Acute cystitis without hematuria  Assessment/Plan:  Principal Problem:   Parkinsonism (HCC) Active Problems:   Dementia due to Parkinson's disease without behavioral disturbance (HCC)   Dehydration   Goals of care, counseling/discussion   Palliative care by specialist   Acute cystitis without hematuria  78 yo F with advanced parkinson's dementia presenting with decreased PO intake, aphasia, and difficulty ambulating.   End-Stage Parkinson's: Patient got some IV fluid and started to eat a little bit. Daughter and palliative care joined [name] in the room. We discussed about Ms. Laplant medical status, prognosis and the best  goal of care.  Apparently, her general medical status and willing to eat slightly improves with IV fluid but she again refuses to eat after that.  We again discussed that IV fluid will be only a temporary solution. The daugther verbalizes understanding and is open to discuss the goal of care (including Hospice) for her mother.    Palliative care consulted and discussed with Hospice with daughter. Per Child psychotherapist, patient approved for residential hospice and is on waiting list for [place] [facility] and if there will not be any available bed there, family may consider [facility] hospice home.  -Appreciate Child psychotherapist follow up -Continue PT OT -Continue Carbidopa-Levodpa andrivastigmine -Holding IV fluid -Encourage for PO intake -Continue delirium precausion -Continue tele sitter   Dispo: Anticipated discharge 1-2 days  Chevis Pretty, MD 02/12/2019, 2:56 PM Pager: [phone] care

## 2019-02-12 NOTE — Progress Notes (Signed)
Physical Therapy Discharge Patient Details Name: Nicole Baxter MRN: [id number] DOB: 11-Apr-1941 Today's Date: 02/12/2019 Time:  -     Patient discharged from PT services secondary to medical decline - pt is currently to be discharged to hospice.   Please see latest therapy progress note for current level of functioning and progress toward goals.    Progress and discharge plan discussed with patient and/or caregiver: Patient/Caregiver agrees with plan  GP    Courtney Paris. Beverely Risen PT, DPT Acute Rehabilitation Services Pager [phone] Office [phone]  Elon Alas Fleet 02/12/2019, 3:30 PM

## 2019-02-12 NOTE — Care Management Important Message (Signed)
Important Message  Patient Details  Name: Nicole Baxter MRN: [id number] Date of Birth: 1941-09-30   Medicare Important Message Given:  Yes    Dorena Bodo 02/12/2019, 2:14 PM

## 2019-02-12 NOTE — Progress Notes (Signed)
Occupational Therapy Discharge Patient Details Name: Nicole Baxter MRN: [id number] DOB: [birth year] Today's Date: 02/12/2019 Time:  -     Patient discharged from OT services secondary to medical decline - d/c to hospice at this time.   Please see latest therapy progress note for current level of functioning and progress toward goals.    Progress and discharge plan discussed with patient and/or caregiver: Patient/Caregiver agrees with plan  GO     Mateo Flow, OTR/L  Acute Rehabilitation Services Pager: [phone] Office: [phone] .  Mateo Flow 02/12/2019, 2:30 PM

## 2019-02-13 ENCOUNTER — Other Ambulatory Visit: Payer: Self-pay | Admitting: *Deleted

## 2019-02-13 DIAGNOSIS — Z515 Encounter for palliative care: Secondary | ICD-10-CM

## 2019-02-13 MED ORDER — ENSURE ENLIVE PO LIQD: 237.0000 mL | Freq: Three times a day (TID) | ORAL | 12 refills | Status: AC

## 2019-02-13 MED ORDER — ADULT MULTIVITAMIN W/MINERALS CH: 1.0000 | ORAL_TABLET | Freq: Every day | ORAL | 1 refills | Status: AC

## 2019-02-13 NOTE — Consult Note (Signed)
   [facility] CM Inpatient Consult   02/13/2019  Nicole Baxter 01/29/1941 [id number]   Patient was active with [facility] for chronic disease management services.  Patient has been engaged by a [facility] Telephonic nurse and [facility] social worker.  Our community based plan of care has focused on disease management and community resource support.  Patient is currently transition to with [facility] facility.  [facility] will sign off as the patient will receive hospice care and needs to be met through Hospice.  For additional questions or referrals please contact:  Natividad Brood, RN BSN [employer] Hospital Liaison  [phone] business mobile phone Toll free office [phone]

## 2019-02-13 NOTE — TOC Transition Note (Addendum)
Transition of Care [facility]) - CM/SW Discharge Note   Patient Details  Name: Nicole Baxter MRN: [id number] Date of Birth: 11-Mar-1941  Transition [facility]) CM/SW Contact:  Doy Hutching, LCSWA Phone Number: 02/13/2019, 12:01 PM   Clinical Narrative:    Pt will [facility] to [facility] [place]. DNR on chart is signed, PTAR papers on chart also.    Final next level of care: Hospice Medical Facility Barriers to Discharge: Barriers Resolved   Patient Goals and CMS Choice Patient states their goals for this hospitalization and ongoing recovery are:: for her to be comfortable CMS Medicare.gov Compare Post Acute Care list provided to:: Patient Represenative (must comment) Choice offered to / list presented to : Adult Children  Discharge Placement              Patient chooses bed at: Other - please specify in the comment section below: Patient to be transferred to facility by: PTAR Name of family member notified: pt daughter Patient and family notified of of transfer: 02/13/19  Discharge Plan and Services   Post Acute Care Choice: [facility] [place]            Social Determinants of Health (SDOH) Interventions     Readmission Risk Interventions No flowsheet data found.

## 2019-02-13 NOTE — Social Work (Signed)
Clinical Social Worker facilitated patient discharge including contacting patient family and facility to confirm patient discharge plans.  Clinical information faxed to facility and family agreeable with plan.  CSW arranged ambulance transport via [facility] to [facility]. Pick up for 1:30pm. RN to call [phone]  with report prior to discharge.  Clinical Social Worker will sign off for now as social work intervention is no longer needed. Please consult [name] again if new need arises.  Octavio Graves, MSW, [facility] Clinical Social Worker [phone]

## 2019-02-13 NOTE — Patient Outreach (Signed)
[facility]) Care Management  02/13/2019  NISHIKA BACHARACH 1941/07/27 [id number]   Case closure   [facility] RN CM notified by [facility] hospital liaison that the patient transitioned to a Hospice Facility at [facility] of the [facility]  [facility] RN CM will close case at this time as patient has been transitioned to a Hospice Facility at [facility] of the [employer] enrolled in an external program  [place] L. Noelle Penner, RN, BSN, CCM [facility] Telephonic Care Management Care Coordinator Office number [phone] Mobile number [phone]  Main [facility] number [phone] Fax number [phone]

## 2019-02-13 NOTE — Progress Notes (Signed)
Nutrition Brief Note  Chart reviewed. Pt now transitioning to comfort care.  No further nutrition interventions warranted at this time.  Please re-consult as needed.   Daisey Caloca A. Qiana Landgrebe, RD, LDN, CDCES Registered Dietitian II Certified Diabetes Care and Education Specialist Pager: [phone] After hours Pager: [phone]  

## 2019-02-13 NOTE — Progress Notes (Signed)
Palliative Medicine RN Note: Rec'd a call from patient's daughter, Desiree Hane [phone]).   [facility], so they would like [name] to go to [facility] in [place]. I have called the Liaison Cheri and left a message. I also updated SW Isabel.  Margret Chance Yanisa Goodgame, RN, BSN, [facility] Palliative Medicine Team 02/13/2019 8:17 AM Office [phone]

## 2019-02-13 NOTE — Progress Notes (Signed)
Report given to Ander Slade, Charity fundraiser at [facility] the [place].

## 2019-02-13 NOTE — Progress Notes (Signed)
Daily Progress Note   Patient Name: Nicole Baxter       Date: 02/13/2019 DOB: 03/26/1941  Age: 78 y.o. MRN#: [id number] Attending Physician: Anne Shutter, MD Primary Care Physician: [name], MD Admit Date: 02/06/2019  Reason for Consultation/Follow-up: Establishing goals of care, Hospice Evaluation, Inpatient hospice referral and Psychosocial/spiritual support  Subjective: NTs working with patient to get ready for discharge, no complaints - son at bedside.  Length of Stay: 7  Current Medications: Scheduled Meds:  . carbidopa-levodopa  2 tablet Oral TID  . enoxaparin (LOVENOX) injection  40 mg Subcutaneous Q24H  . feeding supplement (ENSURE ENLIVE)  237 mL Oral TID BM  . multivitamin with minerals  1 tablet Oral Daily  . rivastigmine  6 mg Oral BID    Continuous Infusions:   PRN Meds: acetaminophen **OR** acetaminophen, ondansetron **OR** ondansetron (ZOFRAN) IV  Physical Exam HENT:     Head: Normocephalic and atraumatic.  Cardiovascular:     Rate and Rhythm: Normal rate and regular rhythm.  Pulmonary:     Effort: Pulmonary effort is normal.     Breath sounds: Normal breath sounds.  Musculoskeletal:     Right lower leg: No edema.     Left lower leg: No edema.  Skin:    General: Skin is warm and dry.  Neurological:     Mental Status: She is alert. She is disoriented.  Psychiatric:        Behavior: Behavior is agitated.             Vital Signs: BP 129/76 (BP Location: Left Arm)   Pulse 67   Temp 97.6 F (36.4 C) (Oral)   Resp 16   Ht 5\' 4"  (1.626 m)   Wt 49.8 kg   SpO2 98%   BMI 18.85 kg/m  SpO2: SpO2: 98 % O2 Device: O2 Device: Room Air O2 Flow Rate:    Intake/output summary:   Intake/Output Summary (Last 24 hours) at 02/13/2019 1342 Last data filed at  02/13/2019 0850 Gross per 24 hour  Intake 120 ml  Output 300 ml  Net -180 ml   LBM: Last BM Date: 02/11/19 Baseline Weight: Weight: 49.8 kg Most recent weight: Weight: 49.8 kg  Palliative Assessment/Data: PPS 20%   Flowsheet Rows     Most Recent Value  Intake Tab  Referral Department  -- [teaching service]  Unit at Time of Referral  Med/Surg Unit  Palliative Care Primary Diagnosis  Neurology  Date Notified  02/07/19  Palliative Care Type  New Palliative care  Reason for referral  Clarify Goals of Care  Date of Admission  02/06/19  Date first seen by Palliative Care  02/08/19  # of days Palliative referral response time  1 Day(s)  # of days IP prior to Palliative referral  1  Clinical Assessment  Palliative Performance Scale Score  20%  Psychosocial & Spiritual Assessment  Palliative Care Outcomes  Patient/Family meeting held?  Yes  Who was at the meeting?  daughter  Palliative Care Outcomes  Clarified goals of care, Provided end of life care assistance, Counseled regarding hospice, Changed to focus on comfort, Transitioned to hospice      Patient Active Problem List  Diagnosis Date Noted  . Acute cystitis without hematuria   . Palliative care by specialist   . Goals of care, counseling/discussion 02/07/2019  . Dehydration 02/06/2019  . Hypotension 07/16/2018  . Fatigue 06/05/2018  . Mild intermittent asthma, uncomplicated 10/31/2017  . Weight loss, unintentional 07/11/2017  . Chronic bilateral low back pain without sciatica 12/19/2016  . Dementia due to Parkinson's disease without behavioral disturbance (HCC) 10/08/2016  . Hypertensive retinopathy of both eyes, grade 2 01/15/2016  . 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 History of colon polyps 12/25/2014  . Health care maintenance 02/13/2014  . Subclinical hypothyroidism 12/25/2013  . Right renal mass 06/11/2012  . Osteoarthritis of both knees 03/15/2012  . Parkinsonism (HCC) 07/13/2010   . ALLERGIC RHINITIS, SEASONAL 02/20/2007  . Hyperlipidemia 09/27/2006  . Essential hypertension 09/27/2006  . GERD 09/27/2006    Palliative Care Assessment & Plan   HPI: 78 y.o.femalewith past medical history of Parkinson's disease, dementia, hypertension, and COPDadmitted on 3/11/2020with altered mental status, refusing to move or eat, and increased extremity stiffness. Family has been describing a progressive decline over the past 3 months coalescing into complete inability to move or eat. Chest x-ray was within normal limits, electrolytes stable she was mildly dehydrated and treated with IV fluids; questionable urinary tract infection. CT head was negative for acute processes. PMT consult ordered for goals of care.   Assessment: F/u with patient - son at bedside. He asks me about giving her IV fluids at hospice home - we spoke about why this isn't feasible and would not be feasible anywhere - not at a rehab facility either. We discussed that IV fluids are a short-term "fix" - and not something that will be continued long-term. We discussed her decreased desire to eat and drink is a part of her disease process. He asks about the staging of her Parkinson's/dementia - we discussed that when patient's are refusing to eat/drink - become non-ambulatory - we consider them "end stage". He appears surprised by this - I ask him about how she'd been doing at home and he admits that he has seen a rapid decline. We discussed that people sometimes "graduate" from hospice - discussed that if she did well and started eating and drinking the hospice staff would work with the family to determine a more appropriate place for [name]. At the end of the conversation, he appears more comfortable with decision to go to hospice home. All questions addressed. He has my contact information.  Called and spoke with daughter, Nicole Baxter. She is tearful about situation but expresses understanding. We discussed type of  care provided at hospice. Emotional support provided.   Recommendations/Plan:  Discharging to hospice facility today - signed DNR on chart  Goals to focus on comfort  Goals of Care and Additional Recommendations:  Limitations on Scope of Treatment: Full Comfort Care and No Artificial Feeding  Code Status:  DNR  Prognosis:   < 2 weeks  Discharge Planning:  Hospice facility  Care plan was discussed with patient's son and daughter  Thank you for allowing the Palliative Medicine Team to assist in the care of this patient.   Total Time 35 minutes Prolonged Time Billed  no       Greater than 50%  of this time was spent counseling and coordinating care related to the above assessment and plan.  Gerlean Ren, DNP, [facility] Palliative Medicine Team Team Phone # [phone]  Pager [phone]

## 2019-02-13 NOTE — TOC Progression Note (Addendum)
Transition of Care [facility]) - Progression Note    Patient Details  Name: REYES ROSKELLEY MRN: [id number] Date of Birth: [birth year]  [facility] CM/SW Contact  Doy Hutching, [place] Phone Number: 02/13/2019, 8:53 AM  Clinical Narrative:    8:53am- CSW received messages from pt daughter and Shawna Orleans with PMT.  Aware [facility]) is now choice due to [facility] having no empty beds. Shawna Orleans, RN with PMT has made referral to [facility], continuing to follow.  11:10am- Pt approved for [facility], requested that pt MD enter d/c order.    Expected Discharge Plan: Hospice Medical Facility Barriers to Discharge: Hospice Bed not available, Continued Medical Work up  Expected Discharge Plan and Services Expected Discharge Plan: Hospice Medical Facility  Post Acute Care Choice: Hospice Medical Facility Living arrangements for the past 2 months: Single Family Home                   Social Determinants of Health (SDOH) Interventions    Readmission Risk Interventions 30 Day Unplanned Readmission Risk Score     ED to Hosp-Admission (Current) from 02/06/2019 in [facility]  SURGICAL  30 Day Unplanned Readmission Risk Score (%)  12 Filed at 02/13/2019 0801     This score is the patient's risk of an unplanned readmission within 30 days of being discharged (0 -100%). The score is based on dignosis, age, lab data, medications, orders, and past utilization.   Low:  0-14.9   Medium: 15-21.9   High: 22-29.9   Extreme: 30 and above       No flowsheet data found.

## 2019-02-13 NOTE — Progress Notes (Addendum)
[facility]: Spoke to pt's daughter on the phone regarding hospice services and philosophy at [facility] [place]. They are in agreement with services. Spoke to Dr. Hazel Sams our medical director and she has been approved for services. Spoke to Sierra Brooks and she will contact MD for portable DNR and arrange ambulance. [facility] pick up time at 1230-100pm.--Daughter will need to come to our office to sign paper work before pt can transfer. Trying aggressively to get in touch with the daughter to let her know about acceptance and set this time up.   Norm Parcel RN [phone]

## 2019-02-13 NOTE — Progress Notes (Addendum)
   Subjective: Patient was seen and evaluated at bedside on morning rounds. No acute events overnight. She is resting in the bed, tearful and mentions that her children do not come to see her or talk to her. We tell her that they have been visiting her every day and they care about gher. She stops crying then.  She does not have other complaints, denies pain and when asked her what we can get her, she smiles and says something to eat!  Objective:  Vital signs in last 24 hours: Vitals:   02/11/19 2146 02/12/19 0551 02/12/19 1607 02/12/19 2039  BP: 119/87 (!) 131/97 128/64 130/82  Pulse: 79 72 (!) 57 75  Resp: 16 16 16 16   Temp: 98 F (36.7 C) 98.1 F (36.7 C) 97.7 F (36.5 C) (!) 97.5 F (36.4 C)  TempSrc: Oral Oral Oral Axillary  SpO2: 98% 99% 97% 100%  Weight:      Height:       Vital signs reviewed, nursing notes reviewed General: NAD, cooperative with exam CV: RRR, nl S1S2 Pulmonary exam: No respiratory distress on room air, CTA bilaterally Abdomen: Is soft and non tender to palpation Extremities: Pulses are present. No edema Neurologic exam: Alert and oriented at baseline   Assessment/Plan:  Principal Problem:   Parkinsonism (HCC) Active Problems:   Dementia due to Parkinson's disease without behavioral disturbance (HCC)   Dehydration   Goals of care, counseling/discussion   Palliative care by specialist   Acute cystitis without hematuria  78 yo F with advanced parkinson's dementia presenting with decreased PO intake, aphasia, and difficulty ambulating.   End-Stage Parkinson's: Patients asks for food today. No other clinical changes.  There is no bed available at [facility] [place] [facility],and per paliative care note, family [facility] [place]. Request has been sent and awaiting for response.Paliative care and social worker are on board.  Dispo pending until Ms. Melcher gets a bed [facility] [place] [facility].  -Appreciate Child psychotherapist  follow up -Continue PT OT -Continue Carbidopa-Levodpa andrivastigmine -Encourage for PO intake -Continue delirium precausion -Continue tele sitter   Dispo: Discharge pending Hospice placement  Chevis Pretty, MD 02/13/2019, 5:40 AM Pager: [phone]

## 2019-02-13 NOTE — Progress Notes (Signed)
  Date: 02/13/2019  Patient name: Nicole Baxter  Medical record number: [id number]  Date of birth: 1941/01/10   I have seen and evaluated this patient and I have discussed the plan of care with the house staff. Please see their note for complete details. I concur with their findings with the following additions/corrections:   Seen and examined with the team on rounds this morning.  She again was tearful, saying that her children do not visit her, and we reminded her that we met with her and Helene Kelp yesterday.  The palliative care team and social worker are working on finding a hospice bed for her.  As soon as that is available, we will discharge her to inpatient hospice.  Lenice Pressman, M.D., Ph.D. 02/13/2019, 10:32 AM

## 2019-02-14 ENCOUNTER — Encounter: Payer: Self-pay | Admitting: Internal Medicine

## 2019-03-29 DEATH — deceased

## 2019-08-12 IMAGING — CR DG CHEST 1V PORT
1 series · 1 of 1 positions shown · non-contrast
Comparison: None.

CLINICAL DATA: 77-year-old female with a history of low blood
pressure

EXAM:
PORTABLE CHEST 1 VIEW

[portable]
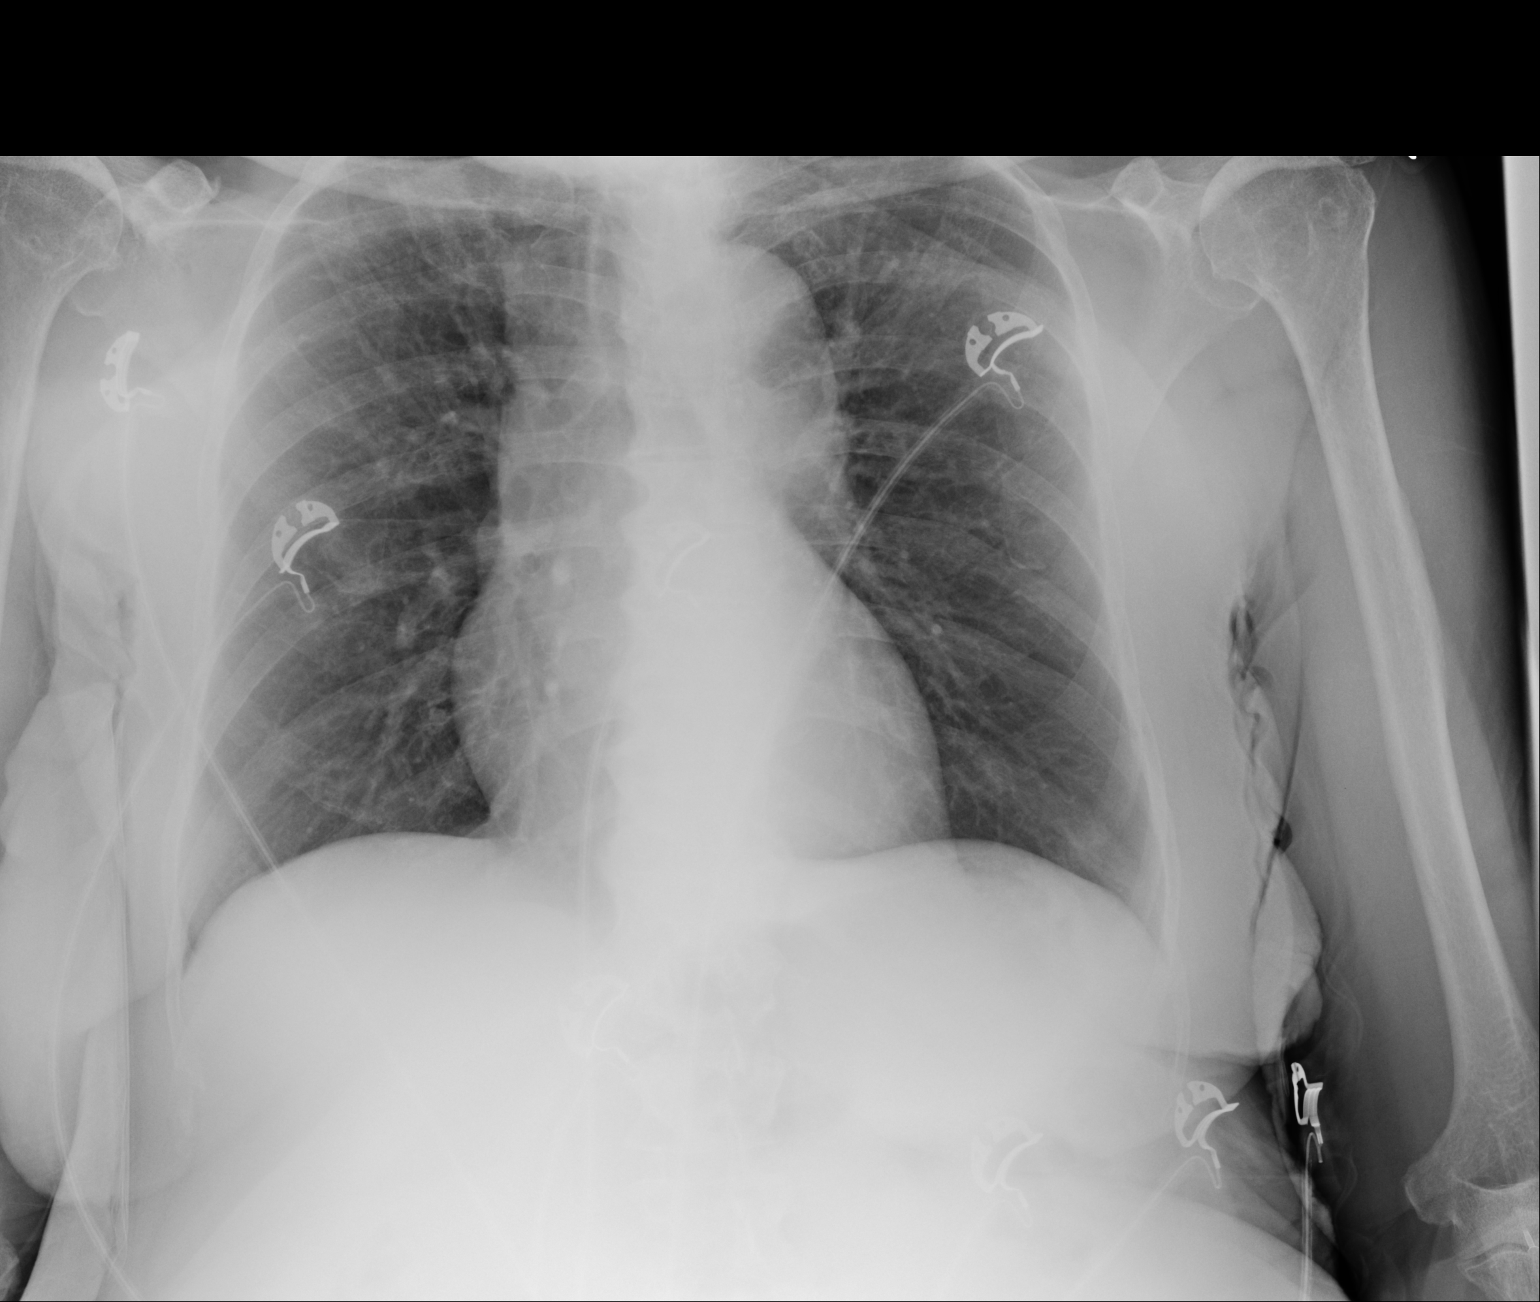

[1 of 1 positions shown; findings below may reference images not displayed]

FINDINGS: Cardiomediastinal silhouette within normal limits in size and
contour, with the mediastinal silhouettes likely central weighted by
right rotation.

No evidence of central vascular congestion. No pneumothorax or
pleural effusion. No confluent airspace disease.

No displaced fracture.
IMPRESSION: Negative for acute cardiopulmonary disease

## 2020-02-14 IMAGING — CT CT HEAD WITHOUT CONTRAST
3 of 4 series · 16 of 47 positions shown, 19 images · non-contrast
Comparison: MRI head 11/09/2015

CLINICAL DATA: Altered level of consciousness.

EXAM:
CT HEAD WITHOUT CONTRAST
TECHNIQUE: Contiguous axial images were obtained from the base of the skull
through the vertex without intravenous contrast.

[Series 3: head 5.0 h30s · axial · 0.41mm/px · z∈[-97,+38]mm · 10 of 31 slices shown, 13 images]
[im 2/31  brain]
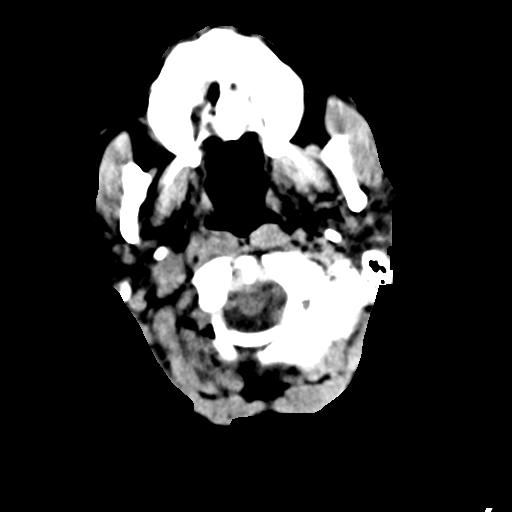
[im 2/31  bone]
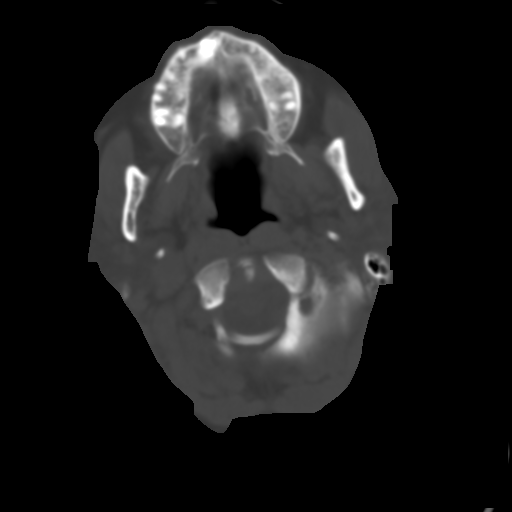
[im 5/31  brain]
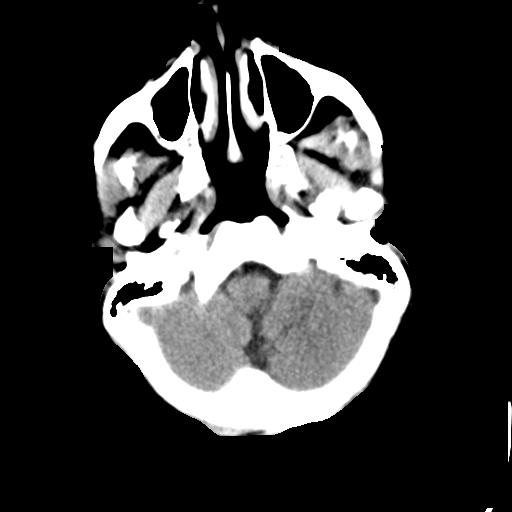
[im 8/31  brain]
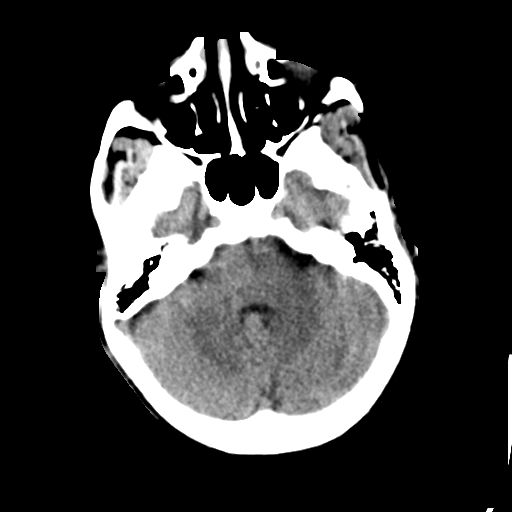
[im 12/31  brain]
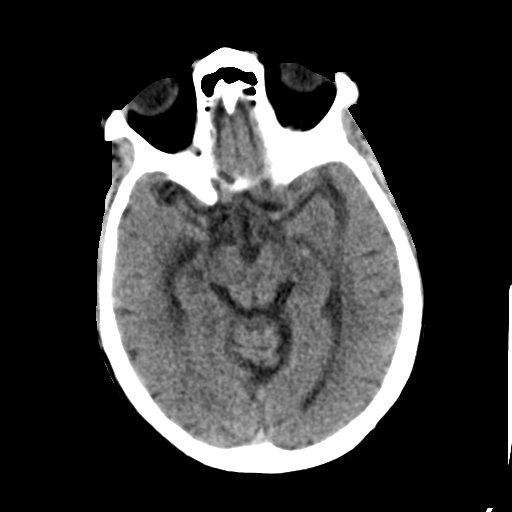
[im 15/31  brain]
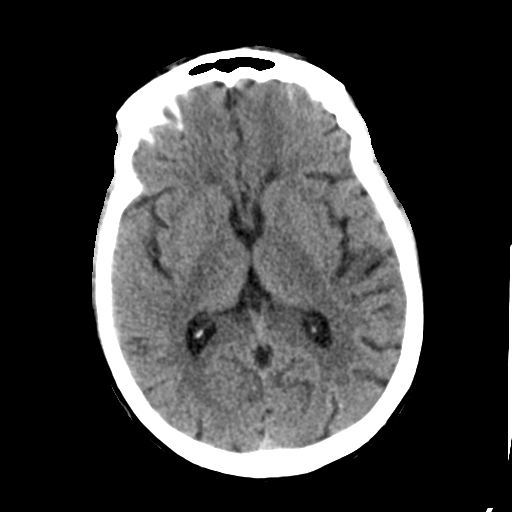
[im 15/31  bone]
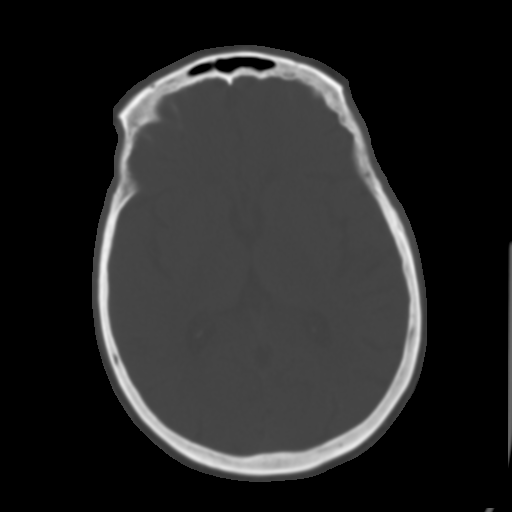
[im 16/31  brain]
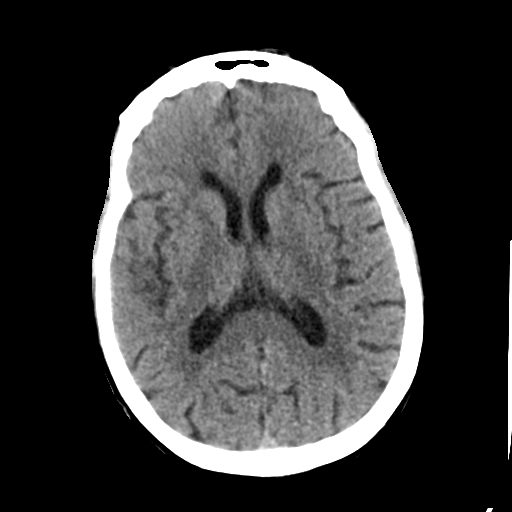
[im 19/31  brain]
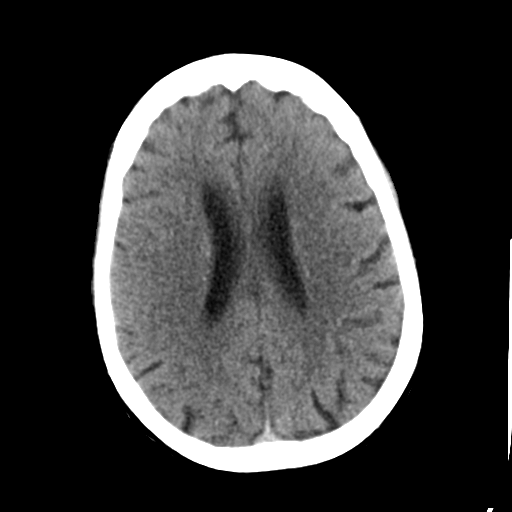
[im 23/31  brain]
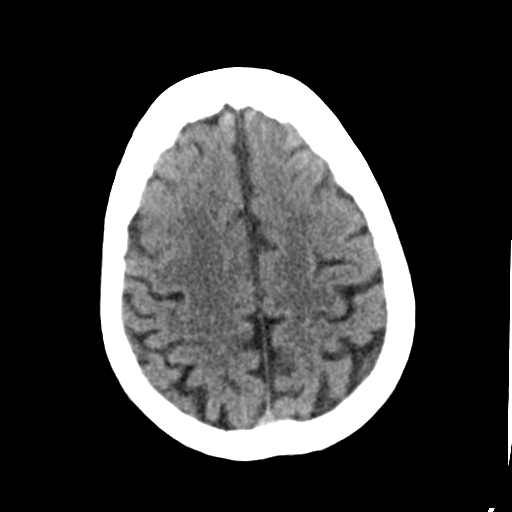
[im 26/31  brain]
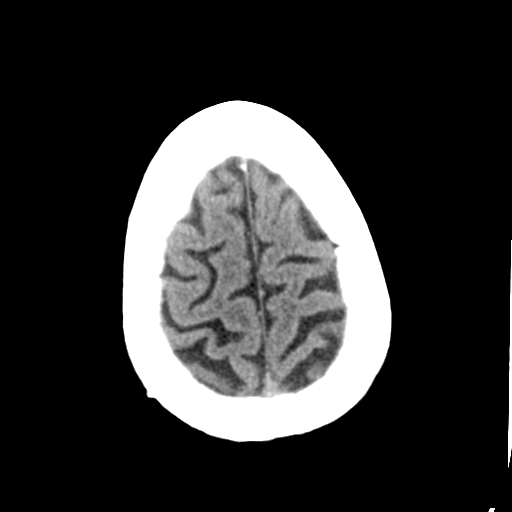
[im 26/31  bone]
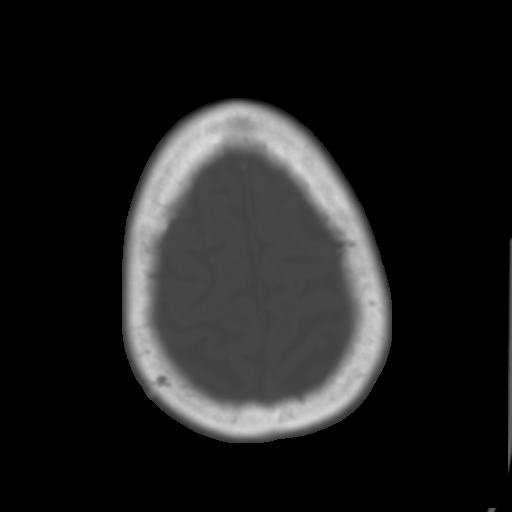
[im 29/31  brain]
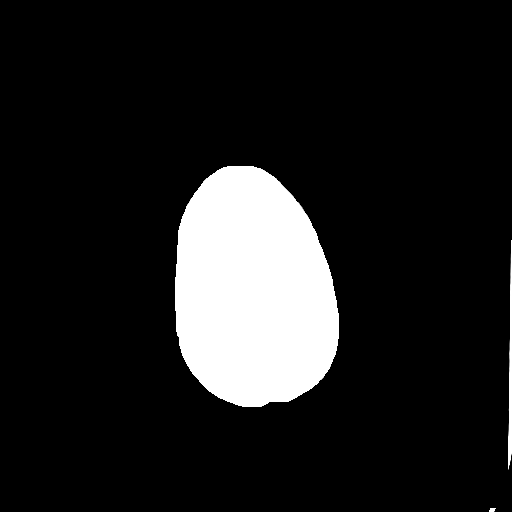

[Series 5: head 3.0 mpr cor · coronal · 0.32mm/px · 3 of 67 slices shown]
[im 23/67  brain]
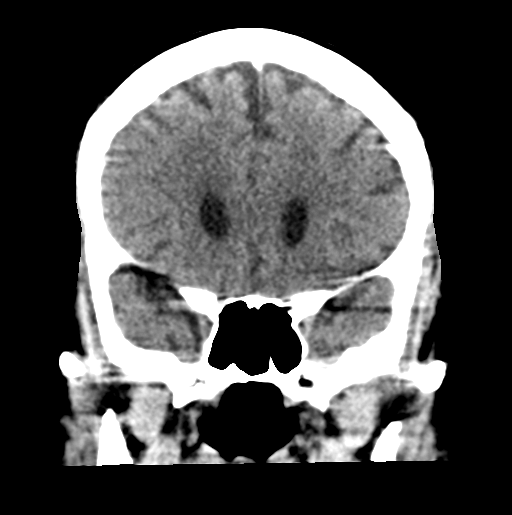
[im 30/67  brain]
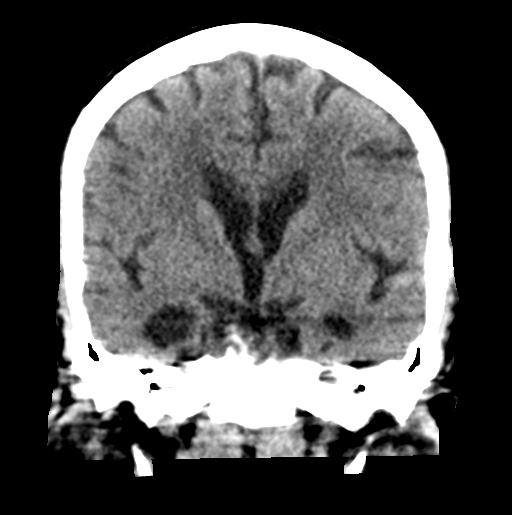
[im 37/67  brain]
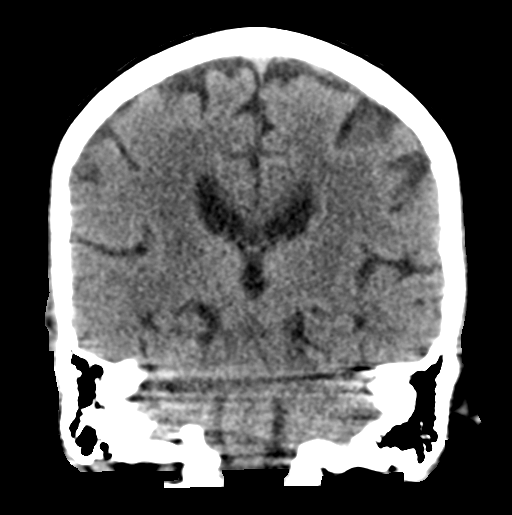

[Series 6: head 3.0 mpr sag · sagittal · 0.31mm/px · 3 of 56 slices shown]
[im 19/56  brain]
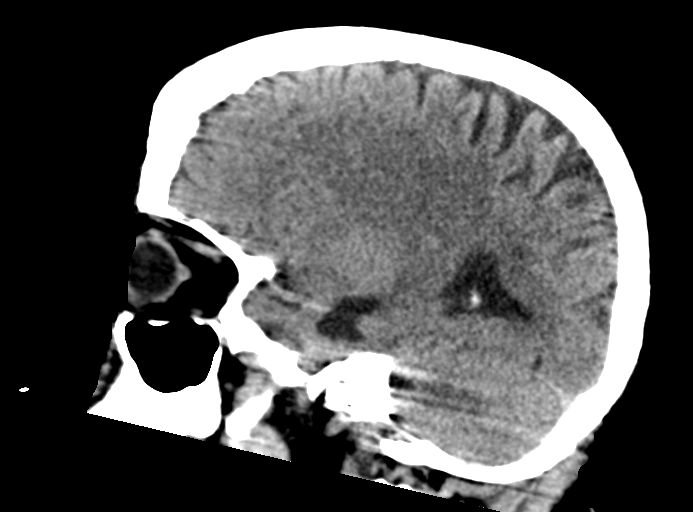
[im 28/56  brain]
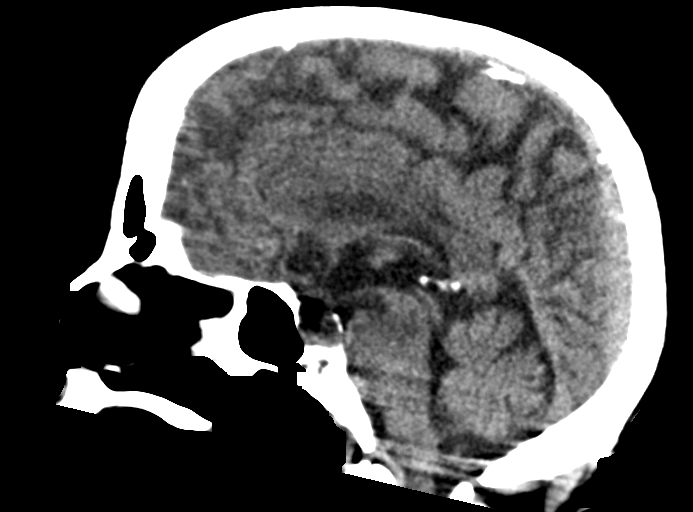
[im 37/56  brain]
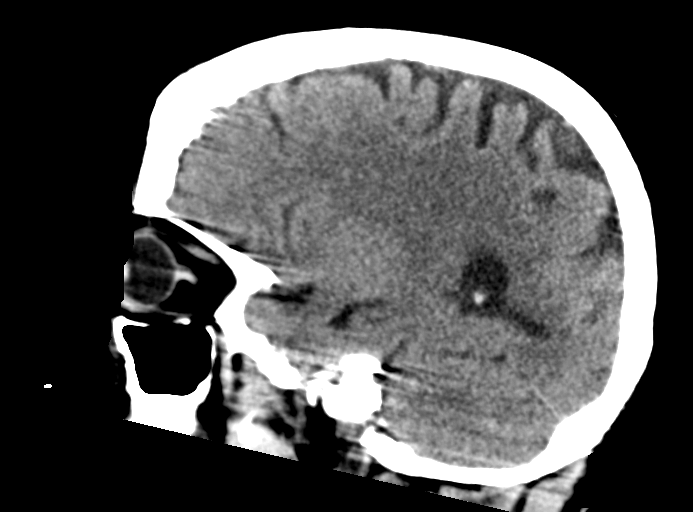

[16 of 47 positions shown; findings below may reference images not displayed]

FINDINGS: Brain: Image quality degraded by motion.

Overall the ventricles are within normal limits for size. There is
asymmetric dilatation of the right temporal horn which is unchanged
from the prior study.

Negative for acute infarct, hemorrhage, mass.

Vascular: Negative for hyperdense vessel

Skull: Negative

Sinuses/Orbits: Negative

Other: None
IMPRESSION: No acute abnormality.
